# Patient Record
Sex: Female | Born: 1985 | Race: White | Hispanic: No | Smoking: Former smoker
Health system: Southern US, Community
[De-identification: ages and names within clinical notes are randomized; demographics above are authoritative.]

## PROBLEM LIST (undated history)

## (undated) DIAGNOSIS — F419 Anxiety disorder, unspecified: Secondary | ICD-10-CM

## (undated) DIAGNOSIS — F329 Major depressive disorder, single episode, unspecified: Secondary | ICD-10-CM

## (undated) DIAGNOSIS — Z8742 Personal history of other diseases of the female genital tract: Secondary | ICD-10-CM

## (undated) DIAGNOSIS — M51379 Other intervertebral disc degeneration, lumbosacral region without mention of lumbar back pain or lower extremity pain: Secondary | ICD-10-CM

## (undated) DIAGNOSIS — M549 Dorsalgia, unspecified: Secondary | ICD-10-CM

## (undated) DIAGNOSIS — M419 Scoliosis, unspecified: Secondary | ICD-10-CM

## (undated) DIAGNOSIS — Z9289 Personal history of other medical treatment: Secondary | ICD-10-CM

## (undated) DIAGNOSIS — F32A Depression, unspecified: Secondary | ICD-10-CM

## (undated) DIAGNOSIS — I2699 Other pulmonary embolism without acute cor pulmonale: Secondary | ICD-10-CM

## (undated) DIAGNOSIS — R7303 Prediabetes: Secondary | ICD-10-CM

## (undated) DIAGNOSIS — G43909 Migraine, unspecified, not intractable, without status migrainosus: Secondary | ICD-10-CM

## (undated) DIAGNOSIS — R002 Palpitations: Secondary | ICD-10-CM

## (undated) DIAGNOSIS — M5137 Other intervertebral disc degeneration, lumbosacral region: Secondary | ICD-10-CM

## (undated) HISTORY — DX: Personal history of other medical treatment: Z92.89

## (undated) HISTORY — DX: Prediabetes: R73.03

## (undated) HISTORY — PX: APPENDECTOMY: SHX54

## (undated) HISTORY — DX: Other intervertebral disc degeneration, lumbosacral region without mention of lumbar back pain or lower extremity pain: M51.379

## (undated) HISTORY — DX: Dorsalgia, unspecified: M54.9

## (undated) HISTORY — DX: Anxiety disorder, unspecified: F41.9

## (undated) HISTORY — DX: Personal history of other diseases of the female genital tract: Z87.42

## (undated) HISTORY — DX: Palpitations: R00.2

## (undated) HISTORY — DX: Scoliosis, unspecified: M41.9

## (undated) HISTORY — DX: Other pulmonary embolism without acute cor pulmonale: I26.99

## (undated) HISTORY — DX: Migraine, unspecified, not intractable, without status migrainosus: G43.909

## (undated) HISTORY — DX: Major depressive disorder, single episode, unspecified: F32.9

## (undated) HISTORY — DX: Depression, unspecified: F32.A

## (undated) HISTORY — DX: Other intervertebral disc degeneration, lumbosacral region: M51.37

---

## 2006-06-11 HISTORY — PX: BREAST SURGERY: SHX581

## 2006-09-27 ENCOUNTER — Ambulatory Visit: Payer: Self-pay | Admitting: Obstetrics and Gynecology

## 2006-10-15 ENCOUNTER — Ambulatory Visit: Payer: Self-pay | Admitting: Surgery

## 2006-10-23 ENCOUNTER — Ambulatory Visit: Payer: Self-pay | Admitting: Surgery

## 2006-11-12 ENCOUNTER — Emergency Department: Payer: Self-pay | Admitting: Emergency Medicine

## 2007-11-06 LAB — CONVERTED CEMR LAB

## 2007-12-08 ENCOUNTER — Observation Stay: Payer: Self-pay | Admitting: Obstetrics and Gynecology

## 2008-03-15 ENCOUNTER — Observation Stay: Payer: Self-pay

## 2008-03-22 ENCOUNTER — Inpatient Hospital Stay: Payer: Self-pay

## 2009-11-01 ENCOUNTER — Ambulatory Visit: Payer: Self-pay | Admitting: Family Medicine

## 2009-11-01 DIAGNOSIS — B009 Herpesviral infection, unspecified: Secondary | ICD-10-CM

## 2010-07-11 NOTE — Assessment & Plan Note (Signed)
Summary: NEW PT TO ESTABH/DLO   Vital Signs:  Patient profile:   25 year old female Height:      69.50 inches Weight:      183.13 pounds BMI:     26.75 Temp:     98.2 degrees F oral Pulse rate:   72 / minute Pulse rhythm:   regular BP sitting:   110 / 72  (left arm) Cuff size:   regular  Vitals Entered By: Linde Gillis CMA Duncan Dull) (29-Nov-2009 10:56 AM) CC: new patient, establish care   History of Present Illness: 25 yo here to establish care.  Cold sores- gets them around her lips when she is sick or during times of stress. Working out custody with the father of her son, so she has had a lot of stress lately and more sore. Uses topical Abreeva, does not help much.  Well woman- G1P1, goes to OBGYN (Dr. Denice Bors).  No h/o abnormal pap smears.    Had FLP, TSH, CMET done at work a few months ago, per pt normal.  Current Medications (verified): 1)  Valtrex 1 Gm Tabs (Valacyclovir Hcl) .Marland Kitchen.. 1 Tab By Mouth Two Times A Day X 1 Day  Allergies (verified): No Known Drug Allergies  Past History:  Family History: Last updated: 11-29-09 father died in 72s- alcholism mother- had breast CA at age 34, alive and healthy  Social History: Last updated: 11/29/2009 Originally from the [place].  Moved her with her mom 10 years ago.  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  Moved her with her mom 10 years ago.  Single mom, has 71 month old son. 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 Eyes:  Denies blurring. ENT:  Denies difficulty swallowing. CV:  Denies chest pain or discomfort and shortness of breath with exertion. Resp:  Denies shortness of breath. GI:  Denies abdominal pain  and change in bowel habits. GU:  Denies abnormal vaginal bleeding, discharge, dysuria, and genital sores. MS:  Denies joint pain, joint redness, and joint swelling. Derm:  Denies rash. Neuro:  Denies headaches. Psych:  Denies anxiety, depression, sense of great danger, suicidal thoughts/plans, thoughts of violence, unusual visions or sounds, and thoughts /plans of harming others. Endo:  Denies cold intolerance and heat intolerance. Heme:  Denies abnormal bruising.  Physical Exam  General:  Well-developed,well-nourished,in no acute distress; alert,appropriate and cooperative throughout examination Head:  normocephalic.   Eyes:  vision grossly intact, pupils equal, pupils round, and pupils reactive to light.   Ears:  R ear normal and L ear normal.   Nose:  no external deformity.   Mouth:  good dentition.   two herpes lesions around upper lip Lungs:  Normal respiratory effort, chest expands symmetrically. Lungs are clear to auscultation, no crackles or wheezes. Heart:  Normal rate and regular rhythm. S1 and S2 normal without gallop, murmur, click, rub or other extra sounds. Abdomen:  Bowel sounds positive,abdomen soft and non-tender without masses, organomegaly or hernias noted. Msk:  No deformity or scoliosis noted of thoracic or lumbar spine.   Extremities:  No clubbing, cyanosis, edema, or deformity noted with normal full range of motion of all joints.   Neurologic:  alert &  oriented X3 and cranial nerves II-XII intact.   Skin:  Intact without suspicious lesions or rashes Psych:  Cognition and judgment appear intact. Alert and cooperative with normal attention span and concentration. No apparent delusions, illusions, hallucinations   Impression & Recommendations:  Problem # 1:  Preventive Health Care (ICD-V70.0) Reviewed preventive care protocols, scheduled due services, and updated immunizations Discussed nutrition, exercise, diet, and healthy lifestyle.  She will make appt with  her OBGYN for pap smear and bring labs into our office.  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  Complete Medication List: 1)  Valtrex 1 Gm Tabs (Valacyclovir hcl) .Marland Kitchen.. 1 tab by mouth two times a day x 1 day  Patient Instructions: 1)  It was so nice to meet you. 2)  Please bring a copy of your labs when you get a chance. 3)  If you ever need anything, let me know. Prescriptions: VALTREX 1 GM TABS (VALACYCLOVIR HCL) 1 tab by mouth two times a day x 1 day  #30 x 1   Entered and Authorized by:   Ruthe Mannan MD   Signed by:   Ruthe Mannan MD on 11/01/2009   Method used:   Print then Give to Patient   RxID:   [id number]   Prior Medications (reviewed today): None Current Allergies (reviewed today): No known allergies   PAP Result Date:  11/06/2007 PAP Result:  historical PAP Next Due:  2 yr

## 2010-08-24 ENCOUNTER — Other Ambulatory Visit: Payer: Self-pay | Admitting: General Practice

## 2010-08-25 ENCOUNTER — Ambulatory Visit: Payer: Self-pay | Admitting: General Practice

## 2011-12-25 ENCOUNTER — Encounter: Payer: Self-pay | Admitting: Family Medicine

## 2011-12-25 ENCOUNTER — Ambulatory Visit (INDEPENDENT_AMBULATORY_CARE_PROVIDER_SITE_OTHER): Payer: PRIVATE HEALTH INSURANCE | Admitting: Family Medicine

## 2011-12-25 VITALS — BP 112/80 | HR 72 | Temp 98.5°F | Wt 167.0 lb

## 2011-12-25 DIAGNOSIS — R42 Dizziness and giddiness: Secondary | ICD-10-CM | POA: Insufficient documentation

## 2011-12-25 NOTE — Progress Notes (Signed)
  Subjective:    Patient ID: Autumn [name], female    DOB: 09-30-1985, 26 y.o.   MRN: [id number]  HPI  Very pleasant 26 yo female here for dizziness.  Since childhood, she has had episodes of self resolving vertigo- dizziness worsened by movement and changes in head position.  Often associated with nausea and no vomiting. She had  CT of her head a couple of years ago due to these symptoms which per her report was negative.  This episodes has been ongoing for 3 weeks. Saw a doctor at work that told her she had vertigo and gave her meclizine.  She cannot tolerate this- makes her too sleepy, dizzy.  No HA. No focal neurological deficits.  Patient Active Problem List  Diagnosis  . HERPES LABIALIS, RECURRENT  . Vertigo   No past medical history on file. No past surgical history on file. History  Substance Use Topics  . Smoking status: Former Games developer  . Smokeless tobacco: Not on file  . Alcohol Use: Not on file   Family History  Problem Relation Age of Onset  . Cancer Mother 38    breast  . Alcohol abuse Father    No Known Allergies Current Outpatient Prescriptions on File Prior to Visit  Medication Sig Dispense Refill  . norethindrone-ethinyl estradiol 1/35 (ORTHO-NOVUM, NORTREL,CYCLAFEM) tablet Take 1 tablet by mouth daily.       The PMH, PSH, Social History, Family History, Medications, and allergies have been reviewed in [facility], and have been updated if relevant.   Review of Systems    See HPI Objective:   Physical Exam BP 112/80  Pulse 72  Temp 98.5 F (36.9 C)  Wt 167 lb (75.751 kg)  General:  Well-developed,well-nourished,in no acute distress; alert,appropriate and cooperative throughout examination Head:  normocephalic and atraumatic.   Eyes:  vision grossly intact, pupils equal, pupils round, and pupils reactive to light. Pos mild nystagmus with dix hallpike   Msk:  No deformity or scoliosis noted of thoracic or lumbar spine.   Extremities:  No  clubbing, cyanosis, edema, or deformity noted with normal full range of motion of all joints.   Neurologic:  alert & oriented X3 and gait normal.   Skin:  Intact without suspicious lesions or rashes Psych:  Cognition and judgment appear intact. Alert and cooperative with normal attention span and concentration. No apparent delusions, illusions, hallucinations     Assessment & Plan:   1. Vertigo  Ambulatory referral to ENT   New- failed conservative tx. No red flag symptoms. Refer to ENT for treatment. The patient indicates understanding of these issues and agrees with the plan.

## 2011-12-25 NOTE — Patient Instructions (Addendum)
Great to see you. Please stop by to see Shirlee Limerick on your way out to set up your ENT referral.  Vertigo Vertigo means you feel like you or your surroundings are moving when they are not. Vertigo can be dangerous if it occurs when you are at work, driving, or performing difficult activities.  CAUSES  Vertigo occurs when there is a conflict of signals sent to your brain from the visual and sensory systems in your body. There are many different causes of vertigo, including:  Infections, especially in the inner ear.   A bad reaction to a drug or misuse of alcohol and medicines.   Withdrawal from drugs or alcohol.   Rapidly changing positions, such as lying down or rolling over in bed.   A migraine headache.   Decreased blood flow to the brain.   Increased pressure in the brain from a head injury, infection, tumor, or bleeding.  SYMPTOMS  You may feel as though the world is spinning around or you are falling to the ground. Because your balance is upset, vertigo can cause nausea and vomiting. You may have involuntary eye movements (nystagmus). DIAGNOSIS  Vertigo is usually diagnosed by physical exam. If the cause of your vertigo is unknown, your caregiver may perform imaging tests, such as an MRI scan (magnetic resonance imaging). TREATMENT  Most cases of vertigo resolve on their own, without treatment. Depending on the cause, your caregiver may prescribe certain medicines. If your vertigo is related to body position issues, your caregiver may recommend movements or procedures to correct the problem. In rare cases, if your vertigo is caused by certain inner ear problems, you may need surgery. HOME CARE INSTRUCTIONS   Follow your caregiver's instructions.   Avoid driving.   Avoid operating heavy machinery.   Avoid performing any tasks that would be dangerous to you or others during a vertigo episode.   Tell your caregiver if you notice that certain medicines seem to be causing your  vertigo. Some of the medicines used to treat vertigo episodes can actually make them worse in some people.  SEEK IMMEDIATE MEDICAL CARE IF:   Your medicines do not relieve your vertigo or are making it worse.   You develop problems with talking, walking, weakness, or using your arms, hands, or legs.   You develop severe headaches.   Your nausea or vomiting continues or gets worse.   You develop visual changes.   A family member notices behavioral changes.   Your condition gets worse.  MAKE SURE YOU:  Understand these instructions.   Will watch your condition.   Will get help right away if you are not doing well or get worse.  Document Released: 03/07/2005 Document Revised: 05/17/2011 Document Reviewed: 12/14/2010 [facility] Patient Information 2012 [place], [place].

## 2011-12-28 ENCOUNTER — Other Ambulatory Visit: Payer: Self-pay | Admitting: Otolaryngology

## 2011-12-28 LAB — CBC WITH DIFFERENTIAL/PLATELET
Basophil %: 0.8 %
Eosinophil #: 0.2 10*3/uL (ref 0.0–0.7)
Lymphocyte %: 41.3 %
MCH: 29.7 pg (ref 26.0–34.0)
MCHC: 32.4 g/dL (ref 32.0–36.0)
MCV: 92 fL (ref 80–100)
Monocyte %: 7 %
Neutrophil #: 3.4 10*3/uL (ref 1.4–6.5)
Platelet: 226 10*3/uL (ref 150–440)
RBC: 4.41 10*6/uL (ref 3.80–5.20)
RDW: 12.5 % (ref 11.5–14.5)
WBC: 7.1 10*3/uL (ref 3.6–11.0)

## 2011-12-28 LAB — BASIC METABOLIC PANEL
Anion Gap: 9 (ref 7–16)
BUN: 12 mg/dL (ref 7–18)
Calcium, Total: 8.6 mg/dL (ref 8.5–10.1)
Co2: 26 mmol/L (ref 21–32)
Creatinine: 0.68 mg/dL (ref 0.60–1.30)
EGFR (African American): >60
EGFR (Non-African Amer.): >60
Potassium: 3.6 mmol/L (ref 3.5–5.1)
Sodium: 139 mmol/L (ref 136–145)

## 2011-12-28 LAB — TSH: Thyroid Stimulating Horm: 0.936 u[IU]/mL

## 2011-12-29 ENCOUNTER — Emergency Department: Payer: Self-pay | Admitting: Emergency Medicine

## 2011-12-30 LAB — URINALYSIS, COMPLETE
Nitrite: NEGATIVE
Ph: 6 (ref 4.5–8.0)
Protein: NEGATIVE
RBC,UR: 3 /HPF (ref 0–5)
Specific Gravity: 1.03 (ref 1.003–1.030)
WBC UR: 25 /HPF (ref 0–5)

## 2011-12-30 LAB — PREGNANCY, URINE: Pregnancy Test, Urine: NEGATIVE m[IU]/mL

## 2012-01-01 ENCOUNTER — Ambulatory Visit: Payer: Self-pay | Admitting: Otolaryngology

## 2012-01-10 ENCOUNTER — Ambulatory Visit (INDEPENDENT_AMBULATORY_CARE_PROVIDER_SITE_OTHER): Payer: PRIVATE HEALTH INSURANCE | Admitting: Family Medicine

## 2012-01-10 ENCOUNTER — Encounter: Payer: Self-pay | Admitting: Family Medicine

## 2012-01-10 VITALS — BP 110/70 | HR 76 | Temp 98.0°F | Wt 168.0 lb

## 2012-01-10 DIAGNOSIS — N390 Urinary tract infection, site not specified: Secondary | ICD-10-CM | POA: Insufficient documentation

## 2012-01-10 DIAGNOSIS — Z113 Encounter for screening for infections with a predominantly sexual mode of transmission: Secondary | ICD-10-CM | POA: Insufficient documentation

## 2012-01-10 LAB — POCT URINALYSIS DIPSTICK
Bilirubin, UA: NEGATIVE
Ketones, UA: NEGATIVE
Nitrite, UA: NEGATIVE
pH, UA: 8

## 2012-01-10 NOTE — Patient Instructions (Signed)
Good to see you. We will call you with your lab results from today.  I would stop shaving for a little while- use perfume free, gentle soaps.  Call me if no improvement.

## 2012-01-10 NOTE — Progress Notes (Signed)
  Subjective:    Patient ID: Autumn [name], female    DOB: 11-22-1985, 26 y.o.   MRN: [id number]  HPI  Very pleasant 26 yo female her for STD check.  She does have a new sexual partner and has had unprotected sex with him. At end of her period now. Noticed some itchy bumps on her outer labia and she was worried they are herpes lesions.  She does have h/o HSV 1 (oral lesions) but has never had genital lesions.  No vaginal discharge or dyspareunia.  She was treated for UTI at urgent care over a week ago. No dysuria currently but would like to recheck UA.  Patient Active Problem List  Diagnosis  . HERPES LABIALIS, RECURRENT  . Vertigo  . Screening for STD (sexually transmitted disease)   No past medical history on file. No past surgical history on file. History  Substance Use Topics  . Smoking status: Former Games developer  . Smokeless tobacco: Not on file  . Alcohol Use: Not on file   Family History  Problem Relation Age of Onset  . Cancer Mother 69    breast  . Alcohol abuse Father    No Known Allergies Current Outpatient Prescriptions on File Prior to Visit  Medication Sig Dispense Refill  . norethindrone-ethinyl estradiol 1/35 (ORTHO-NOVUM, NORTREL,CYCLAFEM) tablet Take 1 tablet by mouth daily.      . valACYclovir (VALTREX) 1000 MG tablet Take 1,000 mg by mouth 2 (two) times daily.       The PMH, PSH, Social History, Family History, Medications, and allergies have been reviewed in [facility], and have been updated if relevant.   Review of Systems See HPI No fevers No n/v/d    Objective:   Physical Exam BP 110/70  Pulse 76  Temp 98 F (36.7 C)  Wt 168 lb (76.204 kg)  General:  Well-developed,well-nourished,in no acute distress; alert,appropriate and cooperative throughout examination Abdomen:  Bowel sounds positive,abdomen soft and non-tender without masses, organomegaly or hernias noted. Rectal:  no external abnormalities.   Genitalia:  Pelvic Exam:  External:         Small areas of erythema, raised, no papules or pustules on outer labia.               Vagina: normal without lesions or masses        Cervix: normal without lesions or masses, does have some blood in vault        Adnexa: normal bimanual exam without masses or fullness        Uterus: normal by palpation Neurologic:  alert & oriented X3 and gait normal.   Skin:  Intact without suspicious lesions or rashes Psych:  Cognition and judgment appear intact. Alert and cooperative with normal attention span and concentration. No apparent delusions, illusions, hallucinations     Assessment & Plan:   1. Screening for STD (sexually transmitted disease)  New- rash does not seem consistent with herpes and no papules to culture.  More consistent with contact/irritant dermatitis.  Advised supportive care.  See pt instructions for details.  GC swab sent.  HSV, HIV and RPR labs today.  HSV 2 antibody, IgG, HIV Antibody, RPR, GC/Chlamydia Amp Probe, Genital  2. UTI (lower urinary tract infection)  UA neg- reassurance provided.

## 2012-01-10 NOTE — Addendum Note (Signed)
Addended by: Eliezer Bottom on: 01/10/2012 11:20 AM   Modules accepted: Orders

## 2012-01-11 LAB — GC/CHLAMYDIA PROBE AMP, GENITAL: GC Probe Amp, Genital: NEGATIVE

## 2012-01-11 LAB — HIV ANTIBODY (ROUTINE TESTING W REFLEX): HIV: NONREACTIVE

## 2012-01-11 LAB — RPR

## 2012-01-11 LAB — HSV 2 ANTIBODY, IGG: HSV 2 Glycoprotein G Ab, IgG: <0.1 IV

## 2012-07-15 ENCOUNTER — Ambulatory Visit (INDEPENDENT_AMBULATORY_CARE_PROVIDER_SITE_OTHER): Payer: 59 | Admitting: Family Medicine

## 2012-07-15 ENCOUNTER — Encounter: Payer: Self-pay | Admitting: Family Medicine

## 2012-07-15 VITALS — BP 110/72 | HR 84 | Temp 98.3°F | Ht 69.5 in | Wt 176.5 lb

## 2012-07-15 DIAGNOSIS — F329 Major depressive disorder, single episode, unspecified: Secondary | ICD-10-CM | POA: Insufficient documentation

## 2012-07-15 DIAGNOSIS — F339 Major depressive disorder, recurrent, unspecified: Secondary | ICD-10-CM

## 2012-07-15 DIAGNOSIS — F418 Other specified anxiety disorders: Secondary | ICD-10-CM | POA: Insufficient documentation

## 2012-07-15 MED ORDER — SERTRALINE HCL 25 MG PO TABS: 25.0000 mg | ORAL_TABLET | Freq: Every day | ORAL | Status: DC

## 2012-07-15 MED ORDER — VALACYCLOVIR HCL 1 G PO TABS: 1000.0000 mg | ORAL_TABLET | Freq: Two times a day (BID) | ORAL | Status: DC

## 2012-07-15 NOTE — Patient Instructions (Signed)
Start sertraline 25 mg daily. Consider counseling, call if yo  Call if you have any intolerable side effects.u are interested.  Given at least 1 week for any side effects  to resolve and 3-4 weeks for any benefit to show up. Follow up in 1 month with myself or Dr. Dayton Martes.

## 2012-07-15 NOTE — Progress Notes (Signed)
  Subjective:    Patient ID: Autumn [name], female    DOB: 01/17/1986, 27 y.o.   MRN: [id number]  HPI 27 year old female pt of Dr. Elmer Sow presents with chronic depression She first noted issues after had son 4 years ago.  Works two jobs. A lot of stress. Raising son on her own. He is having behavoiral issues.  Started on citalopram daily by GYN last year. She took this for a week, had some dizziness.   Given klonopin x 2 times. She never went back.  Last years she had episode of very depressed, didn't care about living.  She reports  In last few monthshe has been irritable easily, overwhelmed easily. Excessive guilt and worry over things she cannot control. She has been more moody, tearful.  Has done counsling through work in past. She felt it did not help.  She has a friend who offers a lot of support.      Review of Systems  Constitutional: Negative for fever and fatigue.  HENT: Negative for ear pain.   Eyes: Negative for pain.  Respiratory: Negative for chest tightness and shortness of breath.   Cardiovascular: Negative for chest pain, palpitations and leg swelling.  Gastrointestinal: Negative for abdominal pain.  Genitourinary: Negative for dysuria.       Objective:   Physical Exam  Constitutional: Vital signs are normal. She appears well-developed and well-nourished. She is cooperative.  Non-toxic appearance. She does not appear ill. No distress.  HENT:  Head: Normocephalic.  Right Ear: Hearing, tympanic membrane, external ear and ear canal normal. Tympanic membrane is not erythematous, not retracted and not bulging.  Left Ear: Hearing, tympanic membrane, external ear and ear canal normal. Tympanic membrane is not erythematous, not retracted and not bulging.  Nose: No mucosal edema or rhinorrhea. Right sinus exhibits no maxillary sinus tenderness and no frontal sinus tenderness. Left sinus exhibits no maxillary sinus tenderness and no frontal sinus tenderness.   Mouth/Throat: Uvula is midline, oropharynx is clear and moist and mucous membranes are normal.  Eyes: Conjunctivae normal, EOM and lids are normal. Pupils are equal, round, and reactive to light. No foreign bodies found.  Neck: Trachea normal and normal range of motion. Neck supple. Carotid bruit is not present. No mass and no thyromegaly present.  Cardiovascular: Normal rate, regular rhythm, S1 normal, S2 normal, normal heart sounds, intact distal pulses and normal pulses.  Exam reveals no gallop and no friction rub.   No murmur heard. Pulmonary/Chest: Effort normal and breath sounds normal. Not tachypneic. No respiratory distress. She has no decreased breath sounds. She has no wheezes. She has no rhonchi. She has no rales.  Abdominal: Soft. Normal appearance and bowel sounds are normal. There is no tenderness.  Neurological: She is alert.  Skin: Skin is warm, dry and intact. No rash noted.  Psychiatric: Her speech is normal and behavior is normal. Judgment and thought content normal. Her mood appears not anxious. Cognition and memory are normal. She exhibits a depressed mood.       Tearful when discussing son          Assessment & Plan:

## 2012-07-15 NOTE — Assessment & Plan Note (Signed)
Recommended counsleing. Started on sertraline. Follow up closely in 1 month.  Total visit time 15 minutes, > 50% spent counseling and cordinating patients care.

## 2012-08-12 ENCOUNTER — Ambulatory Visit: Payer: 59 | Admitting: Family Medicine

## 2012-08-14 ENCOUNTER — Encounter: Payer: Self-pay | Admitting: Family Medicine

## 2012-08-14 ENCOUNTER — Ambulatory Visit (INDEPENDENT_AMBULATORY_CARE_PROVIDER_SITE_OTHER): Payer: 59 | Admitting: Family Medicine

## 2012-08-14 VITALS — BP 120/80 | HR 84 | Temp 98.2°F | Wt 170.0 lb

## 2012-08-14 DIAGNOSIS — F339 Major depressive disorder, recurrent, unspecified: Secondary | ICD-10-CM

## 2012-08-14 MED ORDER — SERTRALINE HCL 50 MG PO TABS: 50.0000 mg | ORAL_TABLET | Freq: Every day | ORAL | Status: DC

## 2012-08-14 NOTE — Patient Instructions (Addendum)
Good to see you. We are increasing your Zoloft to 50 mg daily. Call me in 2 weeks with an update, sooner if you feel worse.

## 2012-08-14 NOTE — Progress Notes (Signed)
Subjective:    Patient ID: Autumn [name], female    DOB: 10/11/1985, 27 y.o.   MRN: [id number]  HPI 27 year old very pleasant female with chronic depression here for one month follow up.  Saw Dr. Ermalene Searing last month.  Started on Zoloft 25 mg daily day last month.  Initially felt better but now feels tearful and agitated with son again.  Does not want to harm herself or her son.       Works two jobs. A lot of stress. Raising son on her own. He is having behavoiral issues.  Started on citalopram daily by GYN last year. She took this for a week, had some dizziness.   Given klonopin x 2 times.  Has done counseling through work in past. She felt it did not help.  She has a friend who offers a lot of support.   Patient Active Problem List  Diagnosis  . HERPES LABIALIS, RECURRENT  . Vertigo  . Screening for STD (sexually transmitted disease)  . Depression, major, recurrent   No past medical history on file. No past surgical history on file. History  Substance Use Topics  . Smoking status: Former Games developer  . Smokeless tobacco: Not on file  . Alcohol Use: Not on file   Family History  Problem Relation Age of Onset  . Cancer Mother 91    breast  . Alcohol abuse Father    No Known Allergies Current Outpatient Prescriptions on File Prior to Visit  Medication Sig Dispense Refill  . Calcium Carb-Cholecalciferol (CALCIUM + D3) 600-200 MG-UNIT TABS Take 1 tablet by mouth daily.      . Cyanocobalamin (VITAMIN B 12 PO) Take 500 mg by mouth daily.      . magnesium oxide (MAG-OX) 400 MG tablet Take 400 mg by mouth daily.      . naproxen sodium (ANAPROX) 550 MG tablet Take 550 mg by mouth 2 (two) times daily with a meal.      . norethindrone-ethinyl estradiol 1/35 (ORTHO-NOVUM, NORTREL,CYCLAFEM) tablet Take 1 tablet by mouth daily.      . tizanidine (ZANAFLEX) 2 MG capsule Take 2 mg by mouth as needed.      . valACYclovir (VALTREX) 1000 MG tablet Take 1 tablet (1,000 mg total) by  mouth 2 (two) times daily.  12 tablet  1   No current facility-administered medications on file prior to visit.   The PMH, PSH, Social History, Family History, Medications, and allergies have been reviewed in [facility], and have been updated if relevant.    Review of Systems  Constitutional: Negative for fever and fatigue.  HENT: Negative for ear pain.   Eyes: Negative for pain.  Respiratory: Negative for chest tightness and shortness of breath.   Cardiovascular: Negative for chest pain, palpitations and leg swelling.  Gastrointestinal: Negative for abdominal pain.  Genitourinary: Negative for dysuria.       Objective:   Physical Exam  BP 120/80  Pulse 84  Temp(Src) 98.2 F (36.8 C)  Wt 170 lb (77.111 kg)  BMI 24.75 kg/m2  Constitutional: Vital signs are normal. She appears well-developed and well-nourished. She is cooperative.  Non-toxic appearance. She does not appear ill. No distress.  HENT:  Head: Normocephalic.  Right Ear: Hearing, tympanic membrane, external ear and ear canal normal. Tympanic membrane is not erythematous, not retracted and not bulging.  Left Ear: Hearing, tympanic membrane, external ear and ear canal normal. Tympanic membrane is not erythematous, not retracted and not bulging.  Nose: No  mucosal edema or rhinorrhea. Right sinus exhibits no maxillary sinus tenderness and no frontal sinus tenderness. Left sinus exhibits no maxillary sinus tenderness and no frontal sinus tenderness.  Mouth/Throat: Uvula is midline, oropharynx is clear and moist and mucous membranes are normal.  Eyes: Conjunctivae normal, EOM and lids are normal. Pupils are equal, round, and reactive to light. No foreign bodies found.  Neck: Trachea normal and normal range of motion. Neck supple. Carotid bruit is not present. No mass and no thyromegaly present.  Cardiovascular: Normal rate, regular rhythm, S1 normal, S2 normal, normal heart sounds, intact distal pulses and normal pulses.  Exam reveals  no gallop and no friction rub.   No murmur heard. Pulmonary/Chest: Effort normal and breath sounds normal. Not tachypneic. No respiratory distress. She has no decreased breath sounds. She has no wheezes. She has no rhonchi. She has no rales.  Abdominal: Soft. Normal appearance and bowel sounds are normal. There is no tenderness.  Neurological: She is alert.  Skin: Skin is warm, dry and intact. No rash noted.  Psychiatric: Her speech is normal and behavior is normal. Judgment and thought content normal. Her mood appears not anxious. Cognition and memory are normal. She exhibits a depressed mood.       Tearful but appropriate     Assessment & Plan:  1. Depression, major, recurrent >25 min spent with face to face with patient, >50% counseling and/or coordinating care Not yet controlled on low dose Zoloft.  Will increase to Zoloft 50 mg daily. She will call me in 2- 3 weeks with an update, sooner if she feels worse.

## 2012-08-29 ENCOUNTER — Telehealth: Payer: Self-pay

## 2012-08-29 NOTE — Telephone Encounter (Signed)
Yes ok to increase to 100 mg daily of Zoloft.

## 2012-08-29 NOTE — Telephone Encounter (Signed)
Advised patient ok to increase dose.

## 2012-08-29 NOTE — Telephone Encounter (Signed)
Pt seen 08/14/12 calling with update on how feeling; pt said fills little better, energy level is 10 % better. Pt still has times of crying and agitation. Pt presently taking Sertraline 50 mg daily.  Pt said since she can tolerate Sertraline and not having side effects; pt wants to know if can increase to 100 mg daily and see how pt does before changing to a different med. [facility] pharmacy.Please advise.

## 2012-09-01 MED ORDER — SERTRALINE HCL 100 MG PO TABS: 100.0000 mg | ORAL_TABLET | Freq: Every day | ORAL | Status: DC

## 2012-09-01 NOTE — Addendum Note (Signed)
Addended by: Eliezer Bottom on: 09/01/2012 03:31 PM   Modules accepted: Orders

## 2012-09-01 NOTE — Telephone Encounter (Signed)
New script sent to pharmacy, per patient's request.

## 2012-09-23 ENCOUNTER — Encounter: Payer: Self-pay | Admitting: Family Medicine

## 2012-09-23 ENCOUNTER — Ambulatory Visit (INDEPENDENT_AMBULATORY_CARE_PROVIDER_SITE_OTHER): Payer: 59 | Admitting: Family Medicine

## 2012-09-23 ENCOUNTER — Telehealth: Payer: Self-pay

## 2012-09-23 VITALS — BP 110/60 | HR 68 | Temp 98.9°F | Wt 173.0 lb

## 2012-09-23 DIAGNOSIS — F339 Major depressive disorder, recurrent, unspecified: Secondary | ICD-10-CM

## 2012-09-23 MED ORDER — CLONAZEPAM 0.5 MG PO TABS: 0.5000 mg | ORAL_TABLET | Freq: Two times a day (BID) | ORAL | Status: DC | PRN

## 2012-09-23 MED ORDER — ESCITALOPRAM OXALATE 10 MG PO TABS: ORAL_TABLET | ORAL | Status: DC

## 2012-09-23 NOTE — Progress Notes (Signed)
Subjective:    Patient ID: Autumn [name], female    DOB: [birth year], 27 y.o.   MRN: [id number]  HPI 27 year old very pleasant female with chronic depression here for follow up.    We increased her Zoloft to 50 mg daily last month.  She stopped taking it over the weekend because she felt like it is not helping. She is now very tearful, irritable.  States she "does not want to be a mother anymore."  Denies wanting to harm her son or herself.  She has appointment with psychiatry on 10/20/2012, Dr. Maryruth Bun.   Works two jobs. A lot of stress. Raising her 33 1/2 year old son.  He is having behavoiral issues. She blames herself for her son's behavioral issues.  Had meeting with his doctor and teachers and his behavior is getting worse.  They think he has ADHD.  Mom helps her.  Father of her child is now involved.   Patient Active Problem List  Diagnosis  . HERPES LABIALIS, RECURRENT  . Vertigo  . Screening for STD (sexually transmitted disease)  . Depression, major, recurrent   No past medical history on file. No past surgical history on file. History  Substance Use Topics  . Smoking status: Former Games developer  . Smokeless tobacco: Not on file  . Alcohol Use: Not on file   Family History  Problem Relation Age of Onset  . Cancer Mother 4    breast  . Alcohol abuse Father    No Known Allergies Current Outpatient Prescriptions on File Prior to Visit  Medication Sig Dispense Refill  . Calcium Carb-Cholecalciferol (CALCIUM + D3) 600-200 MG-UNIT TABS Take 1 tablet by mouth daily.      . Cyanocobalamin (VITAMIN B 12 PO) Take 500 mg by mouth daily.      . magnesium oxide (MAG-OX) 400 MG tablet Take 400 mg by mouth daily.      . naproxen sodium (ANAPROX) 550 MG tablet Take 550 mg by mouth 2 (two) times daily with a meal.      . norethindrone-ethinyl estradiol 1/35 (ORTHO-NOVUM, NORTREL,CYCLAFEM) tablet Take 1 tablet by mouth daily.      . sertraline (ZOLOFT) 100 MG tablet Take 1  tablet (100 mg total) by mouth daily.  90 tablet  0  . tizanidine (ZANAFLEX) 2 MG capsule Take 2 mg by mouth as needed.      . valACYclovir (VALTREX) 1000 MG tablet Take 1 tablet (1,000 mg total) by mouth 2 (two) times daily.  12 tablet  1   No current facility-administered medications on file prior to visit.   The PMH, PSH, Social History, Family History, Medications, and allergies have been reviewed in [facility], and have been updated if relevant.    Review of Systems  Constitutional: Negative for fever and fatigue.  HENT: Negative for ear pain.   Eyes: Negative for pain.  Respiratory: Negative for chest tightness and shortness of breath.   Cardiovascular: Negative for chest pain, palpitations and leg swelling.  Gastrointestinal: Negative for abdominal pain.  Genitourinary: Negative for dysuria.       Objective:   Physical Exam  BP 110/60  Pulse 68  Temp(Src) 98.9 F (37.2 C)  Wt 173 lb (78.472 kg)  BMI 25.19 kg/m2  Constitutional: Vital signs are normal. She appears well-developed and well-nourished. She is cooperative.  Non-toxic appearance. She does not appear ill. No distress.  HENT:  Head: Normocephalic.  Neurological: She is alert.  Skin: Skin is warm, dry and intact.  No rash noted.  Psychiatric: Her speech is normal and behavior is normal. Judgment and thought content normal. Her mood appears not anxious. Cognition and memory are normal. She exhibits a depressed mood.       Tearful but appropriate     Assessment & Plan:  1. Depression, major, recurrent >25 min spent with face to face with patient, >50% counseling and/or coordinating care Will start Lexapro 10 mg daily, may increase to 20 mg daily after two weeks. I also refilled her Klonipin. She is aware that she needs to go to ER if she has any SI or HI. She will call me in 2 weeks with an update and keep appt with Dr. Maryruth Bun.

## 2012-09-23 NOTE — Telephone Encounter (Signed)
Pt called to update depression;  anxiety out of control pt has appt with Dr Maryruth Bun on 10/20/12. Pt wants to change med from Sertraline to another med. Pt stopped taking Sertraline this past weekend. Pt now severe irritablilty, anger, panic attacks, crying spells, depression, tired, pt does not want to harm herself or anyone else but Pt does not want to be a mom; pt has one child. Pt is presently at work. Scheduled appt today at 1 pm. If pt condition changes or worsens prior to appt pt will call back.

## 2012-09-23 NOTE — Patient Instructions (Addendum)
Good to see you. Hang in there. Keep your appointment with Dr. Maryruth Bun.  We are starting Lexapro.  Continue as needed Klonipin.

## 2012-11-04 ENCOUNTER — Other Ambulatory Visit: Payer: Self-pay | Admitting: Physician Assistant

## 2012-11-04 LAB — CBC WITH DIFFERENTIAL/PLATELET
Basophil %: 1.3 %
Eosinophil #: 0.2 10*3/uL (ref 0.0–0.7)
Eosinophil %: 3.8 %
HGB: 13.9 g/dL (ref 12.0–16.0)
MCH: 31 pg (ref 26.0–34.0)
MCV: 89 fL (ref 80–100)
Monocyte #: 0.4 x10 3/mm (ref 0.2–0.9)
Monocyte %: 7 %
Neutrophil %: 45.8 %
Platelet: 218 10*3/uL (ref 150–440)
RBC: 4.48 10*6/uL (ref 3.80–5.20)
WBC: 6.2 10*3/uL (ref 3.6–11.0)

## 2012-11-04 LAB — BASIC METABOLIC PANEL
Anion Gap: 4 — ABNORMAL LOW (ref 7–16)
BUN: 15 mg/dL (ref 7–18)
Calcium, Total: 8.8 mg/dL (ref 8.5–10.1)
Chloride: 105 mmol/L (ref 98–107)
Co2: 29 mmol/L (ref 21–32)
Creatinine: 0.84 mg/dL (ref 0.60–1.30)
Glucose: 69 mg/dL (ref 65–99)
Potassium: 3.5 mmol/L (ref 3.5–5.1)
Sodium: 138 mmol/L (ref 136–145)

## 2012-11-04 LAB — OCCULT BLOOD X 1 CARD TO LAB, STOOL: Occult Blood, Feces: NEGATIVE

## 2012-11-04 LAB — CLOSTRIDIUM DIFFICILE BY PCR

## 2012-11-05 ENCOUNTER — Encounter: Payer: Self-pay | Admitting: Radiology

## 2012-11-06 ENCOUNTER — Ambulatory Visit: Payer: 59 | Admitting: Family Medicine

## 2012-12-09 ENCOUNTER — Other Ambulatory Visit: Payer: Self-pay | Admitting: *Deleted

## 2012-12-09 MED ORDER — VALACYCLOVIR HCL 1 G PO TABS: 1000.0000 mg | ORAL_TABLET | Freq: Two times a day (BID) | ORAL | Status: DC

## 2013-02-23 ENCOUNTER — Other Ambulatory Visit: Payer: Self-pay | Admitting: *Deleted

## 2013-02-23 NOTE — Telephone Encounter (Signed)
Received faxed refill request from pharmacy. Last office visit 09/23/12. Is it okay to refill medication?

## 2013-02-24 MED ORDER — VALACYCLOVIR HCL 1 G PO TABS: 1000.0000 mg | ORAL_TABLET | Freq: Two times a day (BID) | ORAL | Status: DC

## 2013-02-25 ENCOUNTER — Other Ambulatory Visit: Payer: Self-pay | Admitting: Physician Assistant

## 2013-02-26 LAB — URINE CULTURE

## 2013-03-06 ENCOUNTER — Encounter: Payer: Self-pay | Admitting: Family Medicine

## 2013-03-06 ENCOUNTER — Ambulatory Visit (INDEPENDENT_AMBULATORY_CARE_PROVIDER_SITE_OTHER): Payer: 59 | Admitting: Family Medicine

## 2013-03-06 VITALS — BP 110/64 | HR 82 | Temp 98.2°F | Ht 69.5 in | Wt 187.0 lb

## 2013-03-06 DIAGNOSIS — R102 Pelvic and perineal pain: Secondary | ICD-10-CM | POA: Insufficient documentation

## 2013-03-06 DIAGNOSIS — R350 Frequency of micturition: Secondary | ICD-10-CM

## 2013-03-06 DIAGNOSIS — R141 Gas pain: Secondary | ICD-10-CM

## 2013-03-06 DIAGNOSIS — R300 Dysuria: Secondary | ICD-10-CM

## 2013-03-06 DIAGNOSIS — R140 Abdominal distension (gaseous): Secondary | ICD-10-CM

## 2013-03-06 LAB — POCT URINALYSIS DIPSTICK
Bilirubin, UA: NEGATIVE
Blood, UA: NEGATIVE
Glucose, UA: NEGATIVE
Spec Grav, UA: 1.015

## 2013-03-06 NOTE — Patient Instructions (Addendum)
Avoid greasy food, increase water and fiber in diet slowly.  Can try lactobaccili probiotic like align. Bring by records from Urgent care and work eval of abdominla symptoms.  We will call with urine culture and GC Chlam results. Avoid bladder irritants like caffeine alcohol, citris, tomato, spicy foods.

## 2013-03-06 NOTE — Progress Notes (Signed)
Subjective:    Patient ID: Autumn [name], female    DOB: 1985-07-20, 27 y.o.   MRN: [id number]  HPI  27 year old previously healthy pt of Dr. Elmer Sow presents with new onset  3 weeks intermittent pelvic central pressure, burning with urination and  vaginal burning,  In last week sharp pains in pelvic area. Darker urine, vaginal odor.  No vaginal itching or discharge.   Hx of UTI 8/6 treated with septra, never felt like symptoms resolved.  Saw Employee health last week.. Treated with macrobid. No change in symptoms. Urine culture showed mixed bacteria.Marland Kitchen Specimen contaminated... ie negative.  Had sex 2 weeks ago, one partner but they we unfaithful. Had STDs negative in last year. Menses regular every 3 months on OCPs. Hx of ovarian cyst.  She has also been having abdominal bloating, constipation since June. Smelly stool.  Seen at urgent care.  neg Ova parasites, neg Cdiff. X-ray showed constipation in June.Used stool softners. Symptoms improved with change from lexapro to other antidepressant  She continues to have bloating and gas. She goes from constipation to diarrhea, occ expolsive. Some relief with BMs.  Family history of colon cancer, distant.    Review of Systems  Constitutional: Negative for fever and fatigue.  HENT: Negative for ear pain.   Eyes: Negative for pain.  Respiratory: Negative for chest tightness and shortness of breath.   Cardiovascular: Negative for chest pain, palpitations and leg swelling.  Gastrointestinal: Positive for nausea, abdominal pain, diarrhea, constipation and abdominal distention. Negative for vomiting.  Genitourinary: Negative for dysuria.       Objective:   Physical Exam  Constitutional: Vital signs are normal. She appears well-developed and well-nourished. She is cooperative.  Non-toxic appearance. She does not appear ill. No distress.  HENT:  Head: Normocephalic.  Right Ear: Hearing, tympanic membrane, external ear and ear  canal normal. Tympanic membrane is not erythematous, not retracted and not bulging.  Left Ear: Hearing, tympanic membrane, external ear and ear canal normal. Tympanic membrane is not erythematous, not retracted and not bulging.  Nose: No mucosal edema or rhinorrhea. Right sinus exhibits no maxillary sinus tenderness and no frontal sinus tenderness. Left sinus exhibits no maxillary sinus tenderness and no frontal sinus tenderness.  Mouth/Throat: Uvula is midline, oropharynx is clear and moist and mucous membranes are normal.  Eyes: Conjunctivae, EOM and lids are normal. Pupils are equal, round, and reactive to light. Lids are everted and swept, no foreign bodies found.  Neck: Trachea normal and normal range of motion. Neck supple. Carotid bruit is not present. No mass and no thyromegaly present.  Cardiovascular: Normal rate, regular rhythm, S1 normal, S2 normal, normal heart sounds, intact distal pulses and normal pulses.  Exam reveals no gallop and no friction rub.   No murmur heard. Pulmonary/Chest: Effort normal and breath sounds normal. Not tachypneic. No respiratory distress. She has no decreased breath sounds. She has no wheezes. She has no rhonchi. She has no rales.  Abdominal: Soft. Normal appearance and bowel sounds are normal. There is no hepatosplenomegaly. There is tenderness in the suprapubic area. There is no CVA tenderness.  Genitourinary: There is no rash or tenderness on the right labia. There is no rash or tenderness on the left labia. Cervix exhibits no motion tenderness, no discharge and no friability. No erythema, tenderness or bleeding around the vagina. No foreign body around the vagina. No signs of injury around the vagina. Vaginal discharge found.  Sent in GC/Chlam  Neurological: She is alert.  Skin: Skin is warm, dry and intact. No rash noted.  Psychiatric: Her speech is normal and behavior is normal. Judgment and thought content normal. Her mood appears not anxious. Cognition  and memory are normal. She does not exhibit a depressed mood.          Assessment & Plan:

## 2013-03-07 DIAGNOSIS — R300 Dysuria: Secondary | ICD-10-CM | POA: Insufficient documentation

## 2013-03-07 LAB — POCT WET PREP WITH KOH
Clue Cells Wet Prep HPF POC: NEGATIVE
Epithelial Wet Prep HPF POC: NEGATIVE
Trichomonas, UA: NEGATIVE

## 2013-03-07 LAB — GC/CHLAMYDIA PROBE AMP
CT Probe RNA: NEGATIVE
GC Probe RNA: NEGATIVE

## 2013-03-07 LAB — POCT UA - MICROSCOPIC ONLY

## 2013-03-07 NOTE — Assessment & Plan Note (Signed)
Neg UA, neg u preg, Eval pending for GC/chlam but no cervical motion tenderness. Neg wet prep.  No clear suggestion of ovarian cyst, but pt with history.. [place] may be considered next.

## 2013-03-07 NOTE — Assessment & Plan Note (Signed)
UA clear, micro showed some contaminants, send for culture. ? Bladder irritation.Marland Kitchen Avoid irritants.

## 2013-03-07 NOTE — Assessment & Plan Note (Signed)
Most consistent with IBS. She will bring in records for Urgent care for work up already completed.. ? TSH, ? Celiac disease eval.  May need further lab eval.  Start with increase fiber, increase water, decrease stress, avoid fatty foods. Info given on IBS. Start probiotic for bowl regularity.

## 2013-06-27 ENCOUNTER — Ambulatory Visit: Payer: 59 | Admitting: Internal Medicine

## 2013-06-30 ENCOUNTER — Encounter: Payer: Self-pay | Admitting: Family Medicine

## 2013-06-30 ENCOUNTER — Ambulatory Visit (INDEPENDENT_AMBULATORY_CARE_PROVIDER_SITE_OTHER)
Admission: RE | Admit: 2013-06-30 | Discharge: 2013-06-30 | Disposition: A | Discharge status: Home / Self Care | Payer: 59 | Source: Ambulatory Visit | Attending: Family Medicine | Admitting: Family Medicine

## 2013-06-30 ENCOUNTER — Telehealth: Payer: Self-pay | Admitting: Family Medicine

## 2013-06-30 ENCOUNTER — Ambulatory Visit (INDEPENDENT_AMBULATORY_CARE_PROVIDER_SITE_OTHER): Payer: 59 | Admitting: Family Medicine

## 2013-06-30 VITALS — BP 118/78 | HR 95 | Temp 98.5°F | Ht 69.5 in | Wt 199.0 lb

## 2013-06-30 DIAGNOSIS — R071 Chest pain on breathing: Secondary | ICD-10-CM

## 2013-06-30 DIAGNOSIS — R109 Unspecified abdominal pain: Secondary | ICD-10-CM

## 2013-06-30 DIAGNOSIS — F17200 Nicotine dependence, unspecified, uncomplicated: Secondary | ICD-10-CM

## 2013-06-30 DIAGNOSIS — R079 Chest pain, unspecified: Secondary | ICD-10-CM

## 2013-06-30 DIAGNOSIS — R0789 Other chest pain: Secondary | ICD-10-CM

## 2013-06-30 LAB — POCT URINE PREGNANCY: PREG TEST UR: NEGATIVE

## 2013-06-30 NOTE — Progress Notes (Signed)
Pre-visit discussion using our clinic review tool. No additional management support is needed unless otherwise documented below in the visit note.  

## 2013-06-30 NOTE — Progress Notes (Signed)
Subjective:    Patient ID: Autumn [name], female    DOB: 09-22-1985, 28 y.o.   MRN: [id number]  HPI Here with side/ chest pain   Woke up 2 weeks ago - woke up with sharp stabbing pain in L chest - helped to press on it and lasted 20 min  While working this weekend - occ pain in mid chest or under R breast - lasting almost her whole shift - at least 2 hours per episode  Sat -worst to lift and bend over - seemed muscular Took tums to see if reflux-no improvement   (she tends to be gassy) Noted a few times -soreness to take a deep breath   This am - 15 min under R breast   No nausea/ sweating/sob or fainting symptoms   Not exertional/ just positional   Smoker- smokes 3-5 cig per day - had quit and re started last spring   She stopped her OC 1 mo ago - did have one light period  Thought it may have caused her migraines  She is sexualy active and does not use condoms   Urine preg test is neg today  Patient Active Problem List   Diagnosis Date Noted  . Chest wall pain 06/30/2013  . Smoking 06/30/2013  . Dysuria 03/07/2013  . Abdominal bloating 03/06/2013  . Pain in female pelvis 03/06/2013  . Anxiety and depression 07/15/2012  . Screening for STD (sexually transmitted disease) 01/10/2012  . Vertigo 12/25/2011  . HERPES LABIALIS, RECURRENT 11/01/2009   No past medical history on file. No past surgical history on file. History  Substance Use Topics  . Smoking status: Current Every Day Smoker -- 0.25 packs/day  . Smokeless tobacco: Never Used  . Alcohol Use: Yes     Comment: occasional   Family History  Problem Relation Age of Onset  . Cancer Mother 61    breast  . Alcohol abuse Father    No Known Allergies Current Outpatient Prescriptions on File Prior to Visit  Medication Sig Dispense Refill  . citalopram (CELEXA) 40 MG tablet Take 40 mg by mouth daily.      Marland Kitchen omeprazole (PRILOSEC) 40 MG capsule Take 40 mg by mouth daily as needed.       . tizanidine  (ZANAFLEX) 2 MG capsule Take 2 mg by mouth as needed.      . valACYclovir (VALTREX) 1000 MG tablet Take 1 tablet (1,000 mg total) by mouth 2 (two) times daily.  12 tablet  0   No current facility-administered medications on file prior to visit.    Review of Systems Review of Systems  Constitutional: Negative for fever, appetite change, fatigue and unexpected weight change.  Eyes: Negative for pain and visual disturbance.  Respiratory: Negative for cough and shortness of breath.   Cardiovascular: Negative for  palpitations neg for sob on exertion/ pedal edema /pnd or orthopnea    Gastrointestinal: Negative for nausea, diarrhea and constipation.  Genitourinary: Negative for urgency and frequency.  Skin: Negative for pallor or rash   Neurological: Negative for weakness, light-headedness, numbness and headaches.  Hematological: Negative for adenopathy. Does not bruise/bleed easily.  Psychiatric/Behavioral: Negative for dysphoric mood. The patient is not nervous/anxious.         Objective:   Physical Exam  Constitutional: She appears well-developed and well-nourished. No distress.  overwt and well app  HENT:  Head: Normocephalic and atraumatic.  Mouth/Throat: Oropharynx is clear and moist.  Eyes: Conjunctivae and EOM are normal. Pupils are  equal, round, and reactive to light. Right eye exhibits no discharge. Left eye exhibits no discharge. No scleral icterus.  Neck: Normal range of motion. Neck supple. No thyromegaly present.  Cardiovascular: Normal rate, regular rhythm and intact distal pulses.   Pulmonary/Chest: Effort normal and breath sounds normal. No respiratory distress. She has no wheezes. She has no rales. She exhibits tenderness.  Mild chest wall tenderness R anterolateral Good air exch  Abdominal: Soft. Bowel sounds are normal. She exhibits no distension and no mass. There is no tenderness. There is negative Murphy's sign.  Musculoskeletal: She exhibits no edema and no  tenderness.  Lymphadenopathy:    She has no cervical adenopathy.  Neurological: She is alert. She has normal reflexes. No cranial nerve deficit. She exhibits normal muscle tone. Coordination normal.  Skin: Skin is warm and dry. No rash noted. No erythema.  Psychiatric: She has a normal mood and affect.          Assessment & Plan:

## 2013-06-30 NOTE — Patient Instructions (Signed)
Use warm compress on chest or back when it bothers you  Chest xray today  If not bothersome to your stomach- ibuprofen may help  If short of breath or other symptoms let us know Think hard about quitting smoking

## 2013-06-30 NOTE — Telephone Encounter (Signed)
Patient Information:  Caller Name: [name]  Phone: [phone]  Patient: Autumn, [name]  Gender: Female  DOB: [birth year]  Age: 28 Years  PCP: Arnette Norris [facility])  Pregnant: No  Office Follow Up:  Does the office need to follow up with this patient?: No  Instructions For The Office: N/A  RN Note:  Patient states she Was told by the office there was no opening at the present time. Care advice reviewed and Advised caller to go to ER for evaluation. Understanding expressed  Symptoms  Reason For Call & Symptoms: Patient states she began experiencing "sharp stabbing pain in my chest" two weeks ago, lasting twenty minutes.  She did not experience it again until this weekend on Friday 06/26/13 - 06/28/13, under right breast, lasting 1 hour. Non radiating.  No nausea, slight short of breath. Last episode prior to call lasting 20 minutes.   Voice is clear and purposeful/relaxed.   Stopped BCP 2 weeks ago.  HX Chronic back pain. No cough /cold or congestion  Reviewed Health History In EMR: Yes  Reviewed Medications In EMR: Yes  Reviewed Allergies In EMR: Yes  Reviewed Surgeries / Procedures: Yes  Date of Onset of Symptoms: 06/16/2013 OB / GYN:  LMP: 06/16/2013  Guideline(s) Used:  Chest Pain  Disposition Per Guideline:   Go to ED Now (or to Office with PCP Approval)  Reason For Disposition Reached:   Chest pain lasting longer than 5 minutes  Advice Given:  Call Back If:  Severe chest pain  Constant chest pain lasting longer than 5 minutes  Difficulty breathing  You become worse.  RN Overrode Recommendation:  Go To ED  Advised ED

## 2013-07-01 NOTE — Assessment & Plan Note (Signed)
Disc in detail risks of smoking and possible outcomes including copd, vascular/ heart disease, cancer , respiratory and sinus infections  Pt voices understanding Pt not ready to quit yet but thinking about it

## 2013-07-01 NOTE — Assessment & Plan Note (Signed)
With some tenderness Suspect musculoskeletal  Given smoking - cxr done today- nl  Will watch for sob or other symptoms  Disc symptomatic care - see instructions on AVS - warm compress/ nsaid prn Update if not starting to improve in a week or if worsening

## 2013-07-02 ENCOUNTER — Other Ambulatory Visit: Payer: Self-pay | Admitting: Family Medicine

## 2013-07-02 MED ORDER — IBUPROFEN 800 MG PO TABS: 800.0000 mg | ORAL_TABLET | Freq: Three times a day (TID) | ORAL | Status: DC

## 2013-07-02 NOTE — Telephone Encounter (Signed)
Fax refill request, rout to PCP

## 2013-07-30 ENCOUNTER — Other Ambulatory Visit: Payer: Self-pay

## 2013-07-30 MED ORDER — VALACYCLOVIR HCL 1 G PO TABS: 1000.0000 mg | ORAL_TABLET | Freq: Two times a day (BID) | ORAL | Status: DC

## 2013-07-30 NOTE — Telephone Encounter (Signed)
Pt left v/m requesting refill valtrex to [facility]; pt request larger quantity of # 30 because pt getting outbreak more often. Have not changed quantity; waiting on Dr Hulen Shouts approval.

## 2013-08-18 ENCOUNTER — Ambulatory Visit (INDEPENDENT_AMBULATORY_CARE_PROVIDER_SITE_OTHER): Payer: 59 | Admitting: Family Medicine

## 2013-08-18 ENCOUNTER — Ambulatory Visit: Payer: Self-pay | Admitting: Family Medicine

## 2013-08-18 ENCOUNTER — Encounter: Payer: Self-pay | Admitting: Family Medicine

## 2013-08-18 ENCOUNTER — Telehealth: Payer: Self-pay | Admitting: Family Medicine

## 2013-08-18 VITALS — BP 110/80 | HR 85 | Temp 98.2°F | Ht 69.5 in | Wt 213.5 lb

## 2013-08-18 DIAGNOSIS — R102 Pelvic and perineal pain: Secondary | ICD-10-CM

## 2013-08-18 DIAGNOSIS — R140 Abdominal distension (gaseous): Secondary | ICD-10-CM

## 2013-08-18 DIAGNOSIS — R142 Eructation: Secondary | ICD-10-CM

## 2013-08-18 DIAGNOSIS — R141 Gas pain: Secondary | ICD-10-CM

## 2013-08-18 DIAGNOSIS — R300 Dysuria: Secondary | ICD-10-CM

## 2013-08-18 DIAGNOSIS — N949 Unspecified condition associated with female genital organs and menstrual cycle: Secondary | ICD-10-CM

## 2013-08-18 DIAGNOSIS — R143 Flatulence: Secondary | ICD-10-CM

## 2013-08-18 LAB — POCT URINALYSIS DIPSTICK
Bilirubin, UA: NEGATIVE
Glucose, UA: NEGATIVE
Ketones, UA: NEGATIVE
Leukocytes, UA: NEGATIVE
Nitrite, UA: NEGATIVE
PROTEIN UA: NEGATIVE
Spec Grav, UA: 1.025
UROBILINOGEN UA: 0.2
pH, UA: 6

## 2013-08-18 NOTE — Assessment & Plan Note (Signed)
Has had neg u preg at home recently and STD work up in last 6 months, no new partners.  Will eval with [place] to look into possibility of ovarian cyst etc.

## 2013-08-18 NOTE — Assessment & Plan Note (Signed)
Continued and separate from current poain. Likely IBS. Diet related. Discussed healthy eating, exercise, increase fiber and water in diet.

## 2013-08-18 NOTE — Progress Notes (Signed)
Pre visit review using our clinic review tool, if applicable. No additional management support is needed unless otherwise documented below in the visit note. 

## 2013-08-18 NOTE — Progress Notes (Signed)
Subjective:    Patient ID: Autumn [name], female    DOB: 03-22-1986, 28 y.o.   MRN: [id number]  Abdominal Pain Associated symptoms include dysuria. Pertinent negatives include no fever.  Dysuria     28 year old pt of Dr. Hulen Shouts presents with 3 weeks of new onset low abdominal pain.  Pain in abdomen worse, sharp in last 4-5 days.  Pain greater on left lower abdomen. Pain in left lower abdomen, inside pelvic area with urinating or if holding urination. No burning where urine coming out. No vaginal discharge Nausea, no vomiting, no fever. Nml BMs, no constipation.  Recent negative u preg. Mense has been regular  since off OCP in 05/2013 She has not been sexually active in last 2-3 week. HSe has not had any new sexual.   She was seen in 02/2013 with  new onset 3 weeks intermittent pelvic central pressure, burning with urination and vaginal burning,   Also sharp pains in pelvic area. Darker urine, vaginal odor.  No vaginal itching or discharge.  Hx of UTI 8/6 treated with septra, never felt like symptoms resolved.  Saw Employee health Treated with macrobid. No change in symptoms.  Urine culture showed mixed bacteria.Marland Kitchen Specimen contaminated... ie negative.  Menses regular every 3 months on OCPs.  Hx of ovarian cyst.  She has also been having abdominal bloating, constipation since June. Smelly stool.  Seen at urgent care for this as well over summer. neg Ova parasites, neg Cdiff.  X-ray showed constipation in June.  Used stool softners.   Today: She continues to have bloating and gas. Some relief with BMs.    Family history of colon cancer, distant.   02/2013 U culture clear, GC chlam neg, neg urine preg, neg wet prep      Review of Systems  Constitutional: Negative for fever and fatigue.  HENT: Negative for ear pain.   Eyes: Negative for pain.  Respiratory: Negative for chest tightness and shortness of breath.   Cardiovascular: Negative for chest pain,  palpitations and leg swelling.  Gastrointestinal: Positive for abdominal pain.  Genitourinary: Positive for dysuria.       Objective:   Physical Exam  Constitutional: Vital signs are normal. She appears well-developed and well-nourished. She is cooperative.  Non-toxic appearance. She does not appear ill. No distress.  HENT:  Head: Normocephalic.  Right Ear: Hearing, tympanic membrane, external ear and ear canal normal. Tympanic membrane is not erythematous, not retracted and not bulging.  Left Ear: Hearing, tympanic membrane, external ear and ear canal normal. Tympanic membrane is not erythematous, not retracted and not bulging.  Nose: No mucosal edema or rhinorrhea. Right sinus exhibits no maxillary sinus tenderness and no frontal sinus tenderness. Left sinus exhibits no maxillary sinus tenderness and no frontal sinus tenderness.  Mouth/Throat: Uvula is midline, oropharynx is clear and moist and mucous membranes are normal.  Eyes: Conjunctivae, EOM and lids are normal. Pupils are equal, round, and reactive to light. Lids are everted and swept, no foreign bodies found.  Neck: Trachea normal and normal range of motion. Neck supple. Carotid bruit is not present. No mass and no thyromegaly present.  Cardiovascular: Normal rate, regular rhythm, S1 normal, S2 normal, normal heart sounds, intact distal pulses and normal pulses.  Exam reveals no gallop and no friction rub.   No murmur heard. Pulmonary/Chest: Effort normal and breath sounds normal. Not tachypneic. No respiratory distress. She has no decreased breath sounds. She has no wheezes. She has no rhonchi. She has  no rales.  Abdominal: Soft. Normal appearance and bowel sounds are normal. There is no hepatosplenomegaly. There is tenderness in the right lower quadrant, suprapubic area and left lower quadrant. There is no rigidity, no guarding and no CVA tenderness.  Neurological: She is alert.  Skin: Skin is warm, dry and intact. No rash noted.    Psychiatric: Her speech is normal and behavior is normal. Judgment and thought content normal. Her mood appears not anxious. Cognition and memory are normal. She does not exhibit a depressed mood.          Assessment & Plan:

## 2013-08-18 NOTE — Telephone Encounter (Signed)
Relevant patient education assigned to patient using Emmi. ° °

## 2013-08-18 NOTE — Assessment & Plan Note (Signed)
UA clear .  Less dysuria and more abd pain worse with urination. NO symptoms of vaginal infection.

## 2013-08-18 NOTE — Patient Instructions (Signed)
Ibuprofen 800 mg every 8 hours for pain.  Stop at front desk to schedule US pelvis. If pain not improving schedule follow up with PCP.

## 2013-08-19 ENCOUNTER — Encounter: Payer: Self-pay | Admitting: Family Medicine

## 2013-08-20 ENCOUNTER — Telehealth: Payer: Self-pay

## 2013-08-20 NOTE — Telephone Encounter (Signed)
Pt left v/m requesting cb when Ultrasound results available.

## 2013-09-22 ENCOUNTER — Ambulatory Visit: Payer: 59 | Admitting: Family Medicine

## 2013-09-22 DIAGNOSIS — Z0289 Encounter for other administrative examinations: Secondary | ICD-10-CM

## 2013-11-02 ENCOUNTER — Ambulatory Visit: Payer: Self-pay | Admitting: Physician Assistant

## 2013-12-07 ENCOUNTER — Other Ambulatory Visit: Payer: Self-pay | Admitting: Family Medicine

## 2013-12-25 ENCOUNTER — Encounter: Payer: Self-pay | Admitting: Family Medicine

## 2013-12-25 ENCOUNTER — Ambulatory Visit (INDEPENDENT_AMBULATORY_CARE_PROVIDER_SITE_OTHER): Payer: 59 | Admitting: Family Medicine

## 2013-12-25 VITALS — BP 106/70 | HR 82 | Temp 98.4°F | Wt 228.5 lb

## 2013-12-25 DIAGNOSIS — R635 Abnormal weight gain: Secondary | ICD-10-CM

## 2013-12-25 DIAGNOSIS — F341 Dysthymic disorder: Secondary | ICD-10-CM

## 2013-12-25 DIAGNOSIS — F32A Depression, unspecified: Secondary | ICD-10-CM

## 2013-12-25 DIAGNOSIS — F329 Major depressive disorder, single episode, unspecified: Secondary | ICD-10-CM

## 2013-12-25 DIAGNOSIS — N912 Amenorrhea, unspecified: Secondary | ICD-10-CM | POA: Insufficient documentation

## 2013-12-25 DIAGNOSIS — F419 Anxiety disorder, unspecified: Secondary | ICD-10-CM

## 2013-12-25 LAB — COMPREHENSIVE METABOLIC PANEL
ALK PHOS: 74 U/L (ref 39–117)
ALT: 41 U/L — AB (ref 0–35)
AST: 30 U/L (ref 0–37)
Albumin: 4.3 g/dL (ref 3.5–5.2)
BILIRUBIN TOTAL: 0.7 mg/dL (ref 0.2–1.2)
BUN: 10 mg/dL (ref 6–23)
CALCIUM: 9.5 mg/dL (ref 8.4–10.5)
CHLORIDE: 102 meq/L (ref 96–112)
CO2: 27 mEq/L (ref 19–32)
CREATININE: 0.8 mg/dL (ref 0.4–1.2)
GFR: 97.94 mL/min (ref >60.00)
Glucose, Bld: 78 mg/dL (ref 70–99)
Potassium: 4.2 mEq/L (ref 3.5–5.1)
Sodium: 137 mEq/L (ref 135–145)
Total Protein: 7.5 g/dL (ref 6.0–8.3)

## 2013-12-25 LAB — T4, FREE: FREE T4: 0.81 ng/dL (ref 0.60–1.60)

## 2013-12-25 LAB — T3, FREE: T3 FREE: 3.3 pg/mL (ref 2.3–4.2)

## 2013-12-25 LAB — TSH: TSH: 1.13 u[IU]/mL (ref 0.35–4.50)

## 2013-12-25 NOTE — Progress Notes (Signed)
Subjective:   Patient ID: Autumn [name], female    DOB: 09/19/1985, 28 y.o.   MRN: [id number]  Autumn [name] is a pleasant 28 y.o. year old female who presents to clinic today with Weight Gain and Hot Flashes  on 12/25/2013  HPI:  Has been doing much better emotionally on Celexa.  No longer seeing Dr. Nicolasa Ducking.   She weighs more than she has ever weighed.  She thinks the celexa is increasing her appetite. Also having hot flashes and sweats.  Wants to know if I would prescribe an appetite suppressant.  Wt Readings from Last 3 Encounters:  12/25/13 228 lb 8 oz (103.647 kg)  08/18/13 213 lb 8 oz (96.843 kg)  06/30/13 199 lb (90.266 kg)   Her menstrual period is two weeks late.  Denies breast tenderness or nausea.  Current Outpatient Prescriptions on File Prior to Visit  Medication Sig Dispense Refill  . citalopram (CELEXA) 40 MG tablet Take 40 mg by mouth daily.      Marland Kitchen ibuprofen (ADVIL,MOTRIN) 800 MG tablet Take 1 tablet (800 mg total) by mouth 3 (three) times daily with meals.  90 tablet  0  . omeprazole (PRILOSEC) 40 MG capsule Take 40 mg by mouth daily as needed.       . tizanidine (ZANAFLEX) 2 MG capsule Take 2 mg by mouth as needed.      . valACYclovir (VALTREX) 1000 MG tablet Take 1 tablet (1,000 mg total) by mouth 2 (two) times daily.  12 tablet  0   No current facility-administered medications on file prior to visit.    No Known Allergies  No past medical history on file.  No past surgical history on file.  Family History  Problem Relation Age of Onset  . Cancer Mother 44    breast  . Alcohol abuse Father     History   Social History  . Marital Status: Single    Spouse Name: N/A    Number of Children: 1  . 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 Smoking status: Current Every Day Smoker -- 0.25 packs/day  . Smokeless tobacco: Never Used  . Alcohol Use: Yes     Comment: occasional  . Drug  Use: No  . Sexual Activity: Not on file   Other Topics Concern  . Not on file   Social History Narrative  . No narrative on file   The PMH, PSH, Social History, Family History, Medications, and allergies have been reviewed in [facility], and have been updated if relevant.   Review of Systems    See HPI Denies anxiety or depression No changes in hair, skin or bowels No insomnia No CP or SOB Objective:    BP 106/70  Pulse 82  Temp(Src) 98.4 F (36.9 C) (Oral)  Wt 228 lb 8 oz (103.647 kg)  SpO2 98%  LMP 11/10/2013   Physical Exam   General:  Well-developed,well-nourished,in no acute distress; alert,appropriate and cooperative throughout examination Head:  normocephalic and atraumatic.   Eyes:  vision grossly intact, pupils equal, pupils round, and pupils reactive to light.   Ears:  R ear normal and L ear normal.   Nose:  no external deformity.   Mouth:  good dentition.   Neck:  No deformities, masses, or tenderness noted. Msk:  No deformity or scoliosis noted of thoracic or lumbar spine.   Extremities:  No clubbing, cyanosis, edema, or deformity noted with  normal full range of motion of all joints.   Neurologic:  alert & oriented X3 and gait normal.   Skin:  Intact without suspicious lesions or rashes Psych:  Cognition and judgment appear intact. Alert and cooperative with normal attention span and concentration. No apparent delusions, illusions, hallucinations       Assessment & Plan:   Weight gain  Anxiety and depression No Follow-up on file.

## 2013-12-25 NOTE — Progress Notes (Signed)
Pre visit review using our clinic review tool, if applicable. No additional management support is needed unless otherwise documented below in the visit note. 

## 2013-12-25 NOTE — Assessment & Plan Note (Signed)
Stable on current dose of celexa. 

## 2013-12-25 NOTE — Assessment & Plan Note (Signed)
Deteriorated. Given her dose of Celexa, I do not feel comfortable prescribing phentermine. Did suggest nutritionist referral- she is declining this at this time. Will check labs today to rule out other possible contributing factors.

## 2013-12-25 NOTE — Patient Instructions (Signed)
Great to see you. I will call you with your lab results.   

## 2013-12-26 ENCOUNTER — Telehealth: Payer: Self-pay | Admitting: Family Medicine

## 2013-12-26 NOTE — Telephone Encounter (Signed)
Relevant patient education assigned to patient using Emmi. ° °

## 2013-12-28 ENCOUNTER — Encounter: Payer: Self-pay | Admitting: *Deleted

## 2014-01-15 ENCOUNTER — Telehealth: Payer: Self-pay

## 2014-01-15 NOTE — Telephone Encounter (Signed)
Please put her in a same day appt sooner.

## 2014-01-15 NOTE — Telephone Encounter (Signed)
Pt was seen on 12/25/13 with neg pregnancy test;pt still has not started menstrual cycle. Pt checked pregnancy test today and that was negative.  LMP 11/11/2013.  No abd pain. Pt scheduled appt with Dr Deborra Medina on 01/22/14 due to no sooner appt available. Pt wants to know if there is anything pt could be doing until appt. Or does Dr Deborra Medina want to see pt sooner. Pt request cb.

## 2014-01-15 NOTE — Telephone Encounter (Signed)
Lm on pts vm requesting a call back 

## 2014-01-18 NOTE — Telephone Encounter (Signed)
Lm on pts vm requesting a call back; advised she may schedule appt with front desk

## 2014-01-18 NOTE — Telephone Encounter (Addendum)
Pt left v/m pt still has appt on 01/22/14 and request blood work ordered for pregnancy test prior to 01/22/14 appt. Pt request cb.

## 2014-01-18 NOTE — Telephone Encounter (Signed)
Lm on pts vm requesting a call back. If pt returns call, pls schedule her in same day slot 8/11 or 8/12. She will have labs drawn when she comes. Dr Deborra Medina not in office to place prior labs

## 2014-01-19 ENCOUNTER — Encounter: Payer: Self-pay | Admitting: Family Medicine

## 2014-01-19 ENCOUNTER — Ambulatory Visit (INDEPENDENT_AMBULATORY_CARE_PROVIDER_SITE_OTHER): Payer: 59 | Admitting: Family Medicine

## 2014-01-19 ENCOUNTER — Telehealth: Payer: Self-pay | Admitting: Family Medicine

## 2014-01-19 VITALS — BP 118/76 | HR 86 | Temp 98.6°F | Wt 232.0 lb

## 2014-01-19 DIAGNOSIS — N390 Urinary tract infection, site not specified: Secondary | ICD-10-CM | POA: Insufficient documentation

## 2014-01-19 DIAGNOSIS — R140 Abdominal distension (gaseous): Secondary | ICD-10-CM

## 2014-01-19 DIAGNOSIS — R143 Flatulence: Secondary | ICD-10-CM

## 2014-01-19 DIAGNOSIS — R142 Eructation: Secondary | ICD-10-CM

## 2014-01-19 DIAGNOSIS — N912 Amenorrhea, unspecified: Secondary | ICD-10-CM

## 2014-01-19 DIAGNOSIS — R102 Pelvic and perineal pain: Secondary | ICD-10-CM

## 2014-01-19 DIAGNOSIS — R109 Unspecified abdominal pain: Secondary | ICD-10-CM

## 2014-01-19 DIAGNOSIS — R141 Gas pain: Secondary | ICD-10-CM

## 2014-01-19 DIAGNOSIS — N3000 Acute cystitis without hematuria: Secondary | ICD-10-CM

## 2014-01-19 LAB — POCT URINALYSIS DIPSTICK
BILIRUBIN UA: NEGATIVE
Blood, UA: NEGATIVE
Glucose, UA: NEGATIVE
KETONES UA: NEGATIVE
Nitrite, UA: NEGATIVE
PROTEIN UA: NEGATIVE
Urobilinogen, UA: 4
pH, UA: 6

## 2014-01-19 LAB — POCT URINE PREGNANCY: Preg Test, Ur: NEGATIVE

## 2014-01-19 LAB — HCG, QUANTITATIVE, PREGNANCY: Quantitative HCG: 0.21 m[IU]/mL

## 2014-01-19 MED ORDER — CIPROFLOXACIN HCL 500 MG PO TABS: 500.0000 mg | ORAL_TABLET | Freq: Two times a day (BID) | ORAL | Status: DC

## 2014-01-19 NOTE — Progress Notes (Signed)
Pre visit review using our clinic review tool, if applicable. No additional management support is needed unless otherwise documented below in the visit note. 

## 2014-01-19 NOTE — Patient Instructions (Signed)
Good to see you. We will call you with your blood test results.  Please wait until I call with the results to pick up your cipro prescription.  If your blood test is negative, we need to can a pelvic ultrasound.

## 2014-01-19 NOTE — Telephone Encounter (Signed)
Patient returned your call.  Please call her back at her work phone number.

## 2014-01-19 NOTE — Assessment & Plan Note (Signed)
New- advised to await hcg results before starting abx- erx for cipro 500 mg twice daily x 3 days sent to pharmacy (not safe during pregnancy but she is unlikely pregnant.  Will confirm with hcg quant prior to her starting rx.). The patient indicates understanding of these issues and agrees with the plan.

## 2014-01-19 NOTE — Telephone Encounter (Signed)
Noted. Order entered.

## 2014-01-19 NOTE — Telephone Encounter (Signed)
Spoke to pt and advised her of low count hcg. Pt states that she is wanting to proceed with u/s as discussed.

## 2014-01-19 NOTE — Progress Notes (Signed)
   Subjective:   Patient ID: Autumn [name], female    DOB: [birth year], 28 y.o.   MRN: [id number]  Autumn [name] is a pleasant 28 y.o. year old female who presents to clinic today with Amenorrhea, Back Pain and Flank Pain  on 01/19/2014  HPI: Amenorrhea-  LMP 11/10/13.  Has not been OCPs for over a year. +breast tenderness. Multiple neg u preg.  She is having some increased urinary frequency and dysuria.  No nausea or vomiting. No fevers Intermittent flank pain.  Current Outpatient Prescriptions on File Prior to Visit  Medication Sig Dispense Refill  . citalopram (CELEXA) 40 MG tablet Take 40 mg by mouth daily.      Marland Kitchen ibuprofen (ADVIL,MOTRIN) 800 MG tablet Take 1 tablet (800 mg total) by mouth 3 (three) times daily with meals.  90 tablet  0  . omeprazole (PRILOSEC) 40 MG capsule Take 40 mg by mouth daily as needed.       . tizanidine (ZANAFLEX) 2 MG capsule Take 2 mg by mouth as needed.      . valACYclovir (VALTREX) 1000 MG tablet Take 1 tablet (1,000 mg total) by mouth 2 (two) times daily.  12 tablet  0   No current facility-administered medications on file prior to visit.    No Known Allergies  No past medical history on file.  No past surgical history on file.  Family History  Problem Relation Age of Onset  . Cancer Mother 55    breast  . Alcohol abuse Father     History   Social History  . Marital Status: Single    Spouse Name: N/A    Number of Children: 1  . 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 Smoking status: Current Every Day Smoker -- 0.25 packs/day  . Smokeless tobacco: Never Used  . Alcohol Use: Yes     Comment: occasional  . Drug Use: No  . Sexual Activity: Not on file   Other Topics Concern  . Not on file   Social History Narrative  . No narrative on file   The PMH, PSH, Social History, Family History, Medications, and allergies have been reviewed in [facility], and have been  updated if relevant.    Review of Systems    See HPI +weight gain Wt Readings from Last 3 Encounters:  01/19/14 232 lb (105.235 kg)  12/25/13 228 lb 8 oz (103.647 kg)  08/18/13 213 lb 8 oz (96.843 kg)  +anxiety, denies depression No dizziness No CP or SOB +diarrhea  Objective:    BP 118/76  Pulse 86  Temp(Src) 98.6 F (37 C) (Oral)  Wt 232 lb (105.235 kg)  SpO2 96%  LMP 11/10/2013   Physical Exam  Nursing note and vitals reviewed. Constitutional: She appears well-developed and well-nourished. No distress.  HENT:  Head: Normocephalic.  Abdominal: Soft. Bowel sounds are normal. There is no tenderness. There is no rebound and no guarding.  Skin: Skin is warm and dry.  Psychiatric: She has a normal mood and affect. Her behavior is normal. Thought content normal.          Assessment & Plan:   Flank pain - Plan: Urinalysis Dipstick, Urine culture  Amenorrhea - Plan: POCT urine pregnancy  Acute cystitis without hematuria No Follow-up on file.

## 2014-01-19 NOTE — Addendum Note (Signed)
Addended by: Lucille Passy on: 01/19/2014 03:30 PM   Modules accepted: Orders

## 2014-01-19 NOTE — Assessment & Plan Note (Signed)
New- U preg again neg. Will check hcg quant- stat per pt request. If neg, will proceed with pelvic US. The patient indicates understanding of these issues and agrees with the plan.  Orders Placed This Encounter  Procedures  . Urine culture  . hCG, quantitative, pregnancy  . POCT urine pregnancy  . Urinalysis Dipstick

## 2014-01-19 NOTE — Assessment & Plan Note (Signed)
Deteriorated- likely IBS with now UTI symptoms. Discussed diet and exercise.  She does continue to gain weight as well.

## 2014-01-20 ENCOUNTER — Telehealth: Payer: Self-pay | Admitting: Family Medicine

## 2014-01-20 NOTE — Telephone Encounter (Signed)
Relevant patient education assigned to patient using Emmi. ° °

## 2014-01-21 ENCOUNTER — Ambulatory Visit: Payer: Self-pay | Admitting: Family Medicine

## 2014-01-21 LAB — URINE CULTURE
COLONY COUNT: NO GROWTH
ORGANISM ID, BACTERIA: NO GROWTH

## 2014-01-22 ENCOUNTER — Encounter: Payer: Self-pay | Admitting: Family Medicine

## 2014-01-22 ENCOUNTER — Ambulatory Visit: Payer: 59 | Admitting: Family Medicine

## 2014-01-22 ENCOUNTER — Telehealth: Payer: Self-pay

## 2014-01-22 NOTE — Telephone Encounter (Signed)
Pt left v/m requesting [place] results. [place] results scanned under 01/22/14 date.

## 2014-01-25 ENCOUNTER — Other Ambulatory Visit: Payer: Self-pay | Admitting: Family Medicine

## 2014-01-25 DIAGNOSIS — R102 Pelvic and perineal pain: Secondary | ICD-10-CM

## 2014-01-25 NOTE — Telephone Encounter (Signed)
See result note.  

## 2014-01-26 ENCOUNTER — Other Ambulatory Visit: Payer: Self-pay | Admitting: Family Medicine

## 2014-01-26 ENCOUNTER — Telehealth: Payer: Self-pay

## 2014-01-26 NOTE — Telephone Encounter (Signed)
Pt left v/m;pt does want to proceed with GYN referral to [facility] [place]. Pt used to see GYN at [facility] but does not want to go back to [place] [facility]. Pt request cb.

## 2014-01-27 NOTE — Telephone Encounter (Signed)
Patient wants to go to [facility] down the street from [place].

## 2014-02-04 ENCOUNTER — Ambulatory Visit (INDEPENDENT_AMBULATORY_CARE_PROVIDER_SITE_OTHER): Payer: 59 | Admitting: Obstetrics and Gynecology

## 2014-02-04 ENCOUNTER — Encounter: Payer: Self-pay | Admitting: Obstetrics and Gynecology

## 2014-02-04 VITALS — BP 109/75 | HR 82 | Ht 69.75 in | Wt 235.0 lb

## 2014-02-04 DIAGNOSIS — N912 Amenorrhea, unspecified: Secondary | ICD-10-CM

## 2014-02-04 DIAGNOSIS — R635 Abnormal weight gain: Secondary | ICD-10-CM

## 2014-02-04 LAB — POCT URINE PREGNANCY: Preg Test, Ur: NEGATIVE

## 2014-02-04 MED ORDER — MEDROXYPROGESTERONE ACETATE 10 MG PO TABS: 10.0000 mg | ORAL_TABLET | Freq: Every day | ORAL | Status: DC

## 2014-02-04 NOTE — Progress Notes (Signed)
Patient ID: Autumn [name], female   DOB: [birth year], 28 y.o.   MRN: [id number] 28 yo G1P1 presenting today for the evaluation of 1-month amenorrhea and increased weight gain. Patient reports a 75lb weight gain over the past year. She denies any changes in her eating habits or level of physical activity. She does not feel more stressed than usual . Nothing in her everyday life has changed. She states her cycles are typically regular occuring monthly and lasting 5 days. She had a similar episode of skipping periods a few years ago but this has been the longest time.  Past Medical History  Diagnosis Date  . Depression   . Anxiety   . Migraine   . Back pain     chronic  . Disc disease, degenerative, lumbar or lumbosacral   . Scoliosis    Past Surgical History  Procedure Laterality Date  . Appendectomy    . Breast surgery  2008    lump/ benign   Family History  Problem Relation Age of Onset  . Cancer Mother 60    breast  . Alcohol abuse Father   . Vision loss Maternal Grandmother   . Heart disease Maternal Grandmother   . Cancer Maternal Grandfather   . Alcohol abuse Paternal Grandfather   . Arthritis Maternal Aunt    History  Substance Use Topics  . Smoking status: Current Every Day Smoker -- 0.25 packs/day  . Smokeless tobacco: Never Used  . Alcohol Use: Yes     Comment: occasional   GENERAL: Well-developed, well-nourished female in no acute distress. Obese BREASTS: Symmetric in size. No palpable masses or lymphadenopathy, skin changes, or nipple drainage. ABDOMEN: Soft, nontender, nondistended. No organomegaly. PELVIC: Normal external female genitalia. Vagina is pink and rugated.  Normal discharge. Normal appearing cervix. Uterus is normal in size. No adnexal mass or tenderness. EXTREMITIES: No cyanosis, clubbing, or edema, 2+ distal pulses.  A/P 28 yo with 98-month amenorrhea - Patient with normal thyroid and normal ultrasound - Will check FSH, and LH - Rx provera  provided - RTC if no bleeding s/p provera - weight loss regimen discussed

## 2014-02-04 NOTE — Progress Notes (Signed)
Has had amenorrhea since June, has had negative UPT's and blood work, negative ultrasound.  Referred here by Dr. Deborra Medina to have hormones checked. Concern about weight gain.  Gained 80lbs in last year. Pap smears have been done at [facility] clinic, last one 2years ago.

## 2014-02-05 LAB — FOLLICLE STIMULATING HORMONE: FSH: 5.5 m[IU]/mL

## 2014-02-05 LAB — LUTEINIZING HORMONE: LH: 15.8 m[IU]/mL

## 2014-02-08 ENCOUNTER — Telehealth: Payer: Self-pay | Admitting: *Deleted

## 2014-02-08 NOTE — Telephone Encounter (Signed)
Patient called for lab results.  Notified results are WNL.

## 2014-02-12 ENCOUNTER — Encounter: Payer: 59 | Admitting: Family Medicine

## 2014-03-01 ENCOUNTER — Encounter: Payer: Self-pay | Admitting: *Deleted

## 2014-03-01 ENCOUNTER — Encounter: Payer: Self-pay | Admitting: Family Medicine

## 2014-03-01 ENCOUNTER — Ambulatory Visit (INDEPENDENT_AMBULATORY_CARE_PROVIDER_SITE_OTHER): Payer: 59 | Admitting: Family Medicine

## 2014-03-01 VITALS — BP 118/70 | HR 83 | Temp 98.5°F | Ht 69.75 in | Wt 231.0 lb

## 2014-03-01 DIAGNOSIS — R197 Diarrhea, unspecified: Secondary | ICD-10-CM | POA: Insufficient documentation

## 2014-03-01 NOTE — Patient Instructions (Signed)
Keep drinking fluids Bland diet if you want to eat - bananas/rice/applesause/ toast  You can take some immodium as directed otc. But if it causes abdominal pain stop it  Checking C diff and stool culture today -will inform you of results

## 2014-03-01 NOTE — Assessment & Plan Note (Signed)
2-3 days- continuous and watery w/o blood  Also nausea and malaise and cramping  Reassuring exam today  Check stool for c diff and also cx  Enc fluids  Disc BRAT diet  Trial of immodium is ok with caution Update if not starting to improve in a week or if worsening

## 2014-03-01 NOTE — Progress Notes (Signed)
Pre visit review using our clinic review tool, if applicable. No additional management support is needed unless otherwise documented below in the visit note. 

## 2014-03-01 NOTE — Progress Notes (Signed)
Subjective:    Patient ID: Autumn [name], female    DOB: [birth year], 28 y.o.   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 If it tasted right   No recent abx No recent travel or camping  She visited someone at hospice on Thurs   Patient Active Problem List   Diagnosis Date Noted  . UTI (urinary tract infection) 01/19/2014  . Weight gain 12/25/2013  . Amenorrhea 12/25/2013  . Chest wall pain 06/30/2013  . Smoking 06/30/2013  . Dysuria 03/07/2013  . Abdominal bloating 03/06/2013  . Pain in female pelvis 03/06/2013  . Anxiety and depression 07/15/2012  . Screening for STD (sexually transmitted disease) 01/10/2012  . Vertigo 12/25/2011  . HERPES LABIALIS, RECURRENT 11/01/2009   Past Medical History  Diagnosis Date  . Depression   . Anxiety   . Migraine   . Back pain     chronic  . Disc disease, degenerative, lumbar or lumbosacral   . Scoliosis    Past Surgical History  Procedure Laterality Date  . Appendectomy    . Breast surgery  2008    lump/ benign   History  Substance Use Topics  . Smoking status: Current Every Day Smoker    Types: Cigarettes  . Smokeless tobacco: Never Used     Comment: 3 cigarettes a day  . Alcohol Use: Yes     Comment: occasional   Family History  Problem Relation Age of Onset  . Cancer Mother 63    breast  . Alcohol abuse Father   . Vision loss Maternal Grandmother   . Heart disease Maternal Grandmother   .  Cancer Maternal Grandfather   . Alcohol abuse Paternal Grandfather   . Arthritis Maternal Aunt    No Known Allergies Current Outpatient Prescriptions on File Prior to Visit  Medication Sig Dispense Refill  . citalopram (CELEXA) 40 MG tablet Take 40 mg by mouth daily.      Marland Kitchen ibuprofen (ADVIL,MOTRIN) 800 MG tablet TAKE 1 TABLET BY MOUTH 3 TIMES DAILY WITH MEALS  90 tablet  0  . omeprazole (PRILOSEC) 40 MG capsule Take 40 mg by mouth daily as needed.       . tizanidine (ZANAFLEX) 2 MG capsule Take 2 mg by mouth as needed.      . valACYclovir (VALTREX) 1000 MG tablet TAKE 1 TABLET BY MOUTH 2 TIMES DAILY.  12 tablet  0   No current facility-administered medications on file prior to visit.    Review of Systems Review of Systems  Constitutional: Negative for fever, appetite change,  and unexpected weight change.  Eyes: Negative for pain and visual disturbance.  Respiratory: Negative for cough and shortness of breath.   Cardiovascular: Negative for cp or palpitations    Gastrointestinal: Negative for constipation/ blood in stool/ dark stool or vomiting  Genitourinary: Negative for urgency and frequency.  Skin:  Negative for pallor or rash   Neurological: Negative for weakness, light-headedness, numbness and headaches.  Hematological: Negative for adenopathy. Does not bruise/bleed easily.  Psychiatric/Behavioral: Negative for dysphoric mood. The patient is not nervous/anxious.         Objective:   Physical Exam  Constitutional: She appears well-developed and well-nourished. No distress.  HENT:  Head: Normocephalic and atraumatic.  Mouth/Throat: Oropharynx is clear and moist.  Eyes: Conjunctivae and EOM are normal. Pupils are equal, round, and reactive to light. No scleral icterus.  Neck: Normal range of motion. Neck supple.  Cardiovascular: Normal rate, regular rhythm and normal heart sounds.   Pulmonary/Chest: Effort normal and breath sounds normal. No respiratory distress. She has no  rales.  Abdominal: Soft. She exhibits no distension and no mass. There is tenderness. There is no rebound and no guarding.  Mildly hyperactive bs  Bilateral mild LQ tenderness  No rebound or guarding   Lymphadenopathy:    She has no cervical adenopathy.  Neurological: She is alert. She has normal reflexes. She exhibits normal muscle tone. Coordination normal.  Skin: Skin is warm and dry. No rash noted. No pallor.  Psychiatric: She has a normal mood and affect.          Assessment & Plan:   Problem List Items Addressed This Visit     Other   Diarrhea - Primary     2-3 days- continuous and watery w/o blood  Also nausea and malaise and cramping  Reassuring exam today  Check stool for c diff and also cx  Enc fluids  Disc BRAT diet  Trial of immodium is ok with caution Update if not starting to improve in a week or if worsening      Relevant Orders      Clostridium difficile EIA      Stool culture

## 2014-03-04 NOTE — Addendum Note (Signed)
Addended by: Ellamae Sia on: 03/04/2014 09:08 AM   Modules accepted: Orders

## 2014-04-12 ENCOUNTER — Encounter: Payer: Self-pay | Admitting: Family Medicine

## 2014-04-15 ENCOUNTER — Ambulatory Visit: Payer: Self-pay | Admitting: Family Medicine

## 2014-04-27 ENCOUNTER — Ambulatory Visit: Payer: Self-pay | Admitting: General Practice

## 2014-04-28 ENCOUNTER — Telehealth: Payer: Self-pay | Admitting: Family Medicine

## 2014-04-28 NOTE — Telephone Encounter (Signed)
Patient Information:  Caller Name: Kalanie  Phone: [phone]  Patient: Autumn [name],   Gender: Female  DOB: July 16, 1985  Age: 28 Years  PCP: Arnette [facility])  Pregnant: No  Office Follow Up:  Does the office need to follow up with this patient?: Yes  Instructions For The Office: REQUESTING TEMPORARY SUPPLY OF CITALOPRAM.  RN Note:  PLEASE GIVE HER A CALL TO LET HER KNOW IF RX CALLED TO PHARMACY- NEEDING TEMPORARY SUPPLY UNTIL SHE CAN BE SEEN IN THE OFFICE.  Symptoms  Reason For Call & Symptoms: Needing a refill of Citalopram 40 mgs 1 PO QD and has uncoming appiontment on 05/12/14. She took last pill today and is completely out of medicaiton and is worrried about withdrawal symptoms if she stops med abruptly. Has irregular menses and not sexually active. She uses [facility], information on file.  Reviewed Health History In EMR: Yes  Reviewed Medications In EMR: Yes  Reviewed Allergies In EMR: Yes  Reviewed Surgeries / Procedures: Yes  Date of Onset of Symptoms: 04/28/2014 OB / GYN:  LMP: 02/22/2014  Guideline(s) Used:  No Protocol Available - Information Only  Disposition Per Guideline:   Home Care  Reason For Disposition Reached:   Information only question and nurse able to answer  Advice Given:  Call Back If:  New symptoms develop  You become worse.  Patient Will Follow Care Advice:  YES

## 2014-04-28 NOTE — Telephone Encounter (Signed)
Spoke to pt and informed her Rx called in to pharmacy

## 2014-04-28 NOTE — Telephone Encounter (Signed)
Yes ok to refill as requested. 

## 2014-05-12 ENCOUNTER — Encounter: Payer: Self-pay | Admitting: Family Medicine

## 2014-05-12 ENCOUNTER — Ambulatory Visit (INDEPENDENT_AMBULATORY_CARE_PROVIDER_SITE_OTHER): Payer: 59 | Admitting: Family Medicine

## 2014-05-12 VITALS — BP 120/70 | HR 88 | Temp 98.6°F | Wt 242.5 lb

## 2014-05-12 DIAGNOSIS — F419 Anxiety disorder, unspecified: Principal | ICD-10-CM

## 2014-05-12 DIAGNOSIS — F418 Other specified anxiety disorders: Secondary | ICD-10-CM

## 2014-05-12 DIAGNOSIS — F329 Major depressive disorder, single episode, unspecified: Secondary | ICD-10-CM

## 2014-05-12 MED ORDER — VALACYCLOVIR HCL 1 G PO TABS: ORAL_TABLET | ORAL | Status: DC

## 2014-05-12 MED ORDER — CITALOPRAM HYDROBROMIDE 40 MG PO TABS: 40.0000 mg | ORAL_TABLET | Freq: Every day | ORAL | Status: DC

## 2014-05-12 NOTE — Progress Notes (Signed)
  Subjective:    Patient ID: Autumn [name], female    DOB: [birth year], 28 y.o.   MRN: [id number]  HPI 28 year old very pleasant female with chronic depression here for follow up.    Has tried several antidepressants- zoloft was ineffective. Lexapro caused weight gain. Saw Dr. Nicolasa Ducking previously, tried Effexor which was not effective.   Citalopram is working well.  No longer feels depressed or anxiety.  She has appointment with psychiatry on 10/20/2012, Dr. Nicolasa Ducking.  Sleeping ok.  No SI or HI.   Patient Active Problem List   Diagnosis Date Noted  . Diarrhea 03/01/2014  . UTI (urinary tract infection) 01/19/2014  . Weight gain 12/25/2013  . Amenorrhea 12/25/2013  . Chest wall pain 06/30/2013  . Smoking 06/30/2013  . Dysuria 03/07/2013  . Abdominal bloating 03/06/2013  . Pain in female pelvis 03/06/2013  . Anxiety and depression 07/15/2012  . Screening for STD (sexually transmitted disease) 01/10/2012  . Vertigo 12/25/2011  . HERPES LABIALIS, RECURRENT 11/01/2009   Past Medical History  Diagnosis Date  . Depression   . Anxiety   . Migraine   . Back pain     chronic  . Disc disease, degenerative, lumbar or lumbosacral   . Scoliosis    Past Surgical History  Procedure Laterality Date  . Appendectomy    . Breast surgery  2008    lump/ benign   History  Substance Use Topics  . Smoking status: Current Every Day Smoker    Types: Cigarettes  . Smokeless tobacco: Never Used     Comment: 3 cigarettes a day  . Alcohol Use: Yes     Comment: occasional   Family History  Problem Relation Age of Onset  . Cancer Mother 2    breast  . Alcohol abuse Father   . Vision loss Maternal Grandmother   . Heart disease Maternal Grandmother   . Cancer Maternal Grandfather   . Alcohol abuse Paternal Grandfather   . Arthritis Maternal Aunt    No Known Allergies Current Outpatient Prescriptions on File Prior to Visit  Medication Sig Dispense Refill  . citalopram (CELEXA)  40 MG tablet Take 40 mg by mouth daily.    Marland Kitchen ibuprofen (ADVIL,MOTRIN) 800 MG tablet TAKE 1 TABLET BY MOUTH 3 TIMES DAILY WITH MEALS 90 tablet 0  . omeprazole (PRILOSEC) 40 MG capsule Take 40 mg by mouth daily as needed.     . tizanidine (ZANAFLEX) 2 MG capsule Take 2 mg by mouth as needed.     No current facility-administered medications on file prior to visit.   The PMH, PSH, Social History, Family History, Medications, and allergies have been reviewed in [facility], and have been updated if relevant.    Review of Systems  See HPI      Objective:   Physical Exam  BP 120/70 mmHg  Pulse 88  Temp(Src) 98.6 F (37 C) (Oral)  Wt 242 lb 8 oz (109.997 kg)  SpO2 97%  Constitutional: Vital signs are normal. She appears well-developed and well-nourished. She is cooperative.  Non-toxic appearance. She does not appear ill. No distress.  HENT:  Head: Normocephalic.  Neurological: She is alert.  Skin: Skin is warm, dry and intact. No rash noted.  Psychiatric: Her speech is normal and behavior is normal. Judgment and thought content normal. Her mood appears not anxious. Cognition and memory are normal.  Tearful but appropriate     Assessment & Plan:

## 2014-05-12 NOTE — Patient Instructions (Signed)
Great to see you. Happy holidays.

## 2014-05-12 NOTE — Progress Notes (Signed)
Pre visit review using our clinic review tool, if applicable. No additional management support is needed unless otherwise documented below in the visit note. 

## 2014-05-12 NOTE — Assessment & Plan Note (Signed)
>  15 minutes spent in face to face time with patient, >50% spent in counselling or coordination of care Symptoms well controlled on current dose of celexa. No changes made.

## 2014-08-19 ENCOUNTER — Other Ambulatory Visit (HOSPITAL_COMMUNITY)
Admission: RE | Admit: 2014-08-19 | Discharge: 2014-08-19 | Disposition: A | Discharge status: Home / Self Care | Payer: 59 | Source: Ambulatory Visit | Attending: Family Medicine | Admitting: Family Medicine

## 2014-08-19 ENCOUNTER — Ambulatory Visit (INDEPENDENT_AMBULATORY_CARE_PROVIDER_SITE_OTHER): Payer: 59 | Admitting: Family Medicine

## 2014-08-19 ENCOUNTER — Encounter: Payer: Self-pay | Admitting: Family Medicine

## 2014-08-19 VITALS — BP 110/70 | HR 116 | Temp 98.3°F | Ht 70.0 in | Wt 241.0 lb

## 2014-08-19 DIAGNOSIS — Z113 Encounter for screening for infections with a predominantly sexual mode of transmission: Secondary | ICD-10-CM | POA: Insufficient documentation

## 2014-08-19 DIAGNOSIS — Z0000 Encounter for general adult medical examination without abnormal findings: Secondary | ICD-10-CM

## 2014-08-19 DIAGNOSIS — N760 Acute vaginitis: Secondary | ICD-10-CM | POA: Diagnosis present

## 2014-08-19 DIAGNOSIS — Z1151 Encounter for screening for human papillomavirus (HPV): Secondary | ICD-10-CM | POA: Insufficient documentation

## 2014-08-19 DIAGNOSIS — F418 Other specified anxiety disorders: Secondary | ICD-10-CM

## 2014-08-19 DIAGNOSIS — Z01419 Encounter for gynecological examination (general) (routine) without abnormal findings: Secondary | ICD-10-CM | POA: Insufficient documentation

## 2014-08-19 DIAGNOSIS — Q828 Other specified congenital malformations of skin: Secondary | ICD-10-CM

## 2014-08-19 DIAGNOSIS — R739 Hyperglycemia, unspecified: Secondary | ICD-10-CM | POA: Diagnosis not present

## 2014-08-19 DIAGNOSIS — N912 Amenorrhea, unspecified: Secondary | ICD-10-CM

## 2014-08-19 DIAGNOSIS — Z3009 Encounter for other general counseling and advice on contraception: Secondary | ICD-10-CM

## 2014-08-19 DIAGNOSIS — E669 Obesity, unspecified: Secondary | ICD-10-CM | POA: Insufficient documentation

## 2014-08-19 DIAGNOSIS — F329 Major depressive disorder, single episode, unspecified: Secondary | ICD-10-CM

## 2014-08-19 DIAGNOSIS — F419 Anxiety disorder, unspecified: Secondary | ICD-10-CM

## 2014-08-19 LAB — LIPID PANEL
Cholesterol: 151 mg/dL (ref 0–200)
HDL: 42.9 mg/dL (ref >39.00)
LDL Cholesterol: 90 mg/dL (ref 0–99)
NonHDL: 108.1
TRIGLYCERIDES: 93 mg/dL (ref 0.0–149.0)
Total CHOL/HDL Ratio: 4
VLDL: 18.6 mg/dL (ref 0.0–40.0)

## 2014-08-19 LAB — CBC WITH DIFFERENTIAL/PLATELET
Basophils Absolute: 0.1 10*3/uL (ref 0.0–0.1)
Basophils Relative: 0.8 % (ref 0.0–3.0)
EOS ABS: 0.2 10*3/uL (ref 0.0–0.7)
Eosinophils Relative: 3.8 % (ref 0.0–5.0)
HEMATOCRIT: 43.6 % (ref 36.0–46.0)
HEMOGLOBIN: 15 g/dL (ref 12.0–15.0)
LYMPHS ABS: 2.5 10*3/uL (ref 0.7–4.0)
Lymphocytes Relative: 39.1 % (ref 12.0–46.0)
MCHC: 34.3 g/dL (ref 30.0–36.0)
MCV: 89.3 fl (ref 78.0–100.0)
MONOS PCT: 6 % (ref 3.0–12.0)
Monocytes Absolute: 0.4 10*3/uL (ref 0.1–1.0)
NEUTROS ABS: 3.2 10*3/uL (ref 1.4–7.7)
NEUTROS PCT: 50.3 % (ref 43.0–77.0)
PLATELETS: 294 10*3/uL (ref 150.0–400.0)
RBC: 4.88 Mil/uL (ref 3.87–5.11)
RDW: 12.8 % (ref 11.5–15.5)
WBC: 6.4 10*3/uL (ref 4.0–10.5)

## 2014-08-19 LAB — COMPREHENSIVE METABOLIC PANEL
ALT: 55 U/L — ABNORMAL HIGH (ref 0–35)
AST: 31 U/L (ref 0–37)
Albumin: 4.5 g/dL (ref 3.5–5.2)
Alkaline Phosphatase: 79 U/L (ref 39–117)
BUN: 13 mg/dL (ref 6–23)
CO2: 27 meq/L (ref 19–32)
CREATININE: 0.79 mg/dL (ref 0.40–1.20)
Calcium: 9.8 mg/dL (ref 8.4–10.5)
Chloride: 104 mEq/L (ref 96–112)
GFR: 91.81 mL/min (ref >60.00)
Glucose, Bld: 100 mg/dL — ABNORMAL HIGH (ref 70–99)
POTASSIUM: 4.3 meq/L (ref 3.5–5.1)
Sodium: 136 mEq/L (ref 135–145)
Total Bilirubin: 0.3 mg/dL (ref 0.2–1.2)
Total Protein: 7.5 g/dL (ref 6.0–8.3)

## 2014-08-19 LAB — TSH: TSH: 1.07 u[IU]/mL (ref 0.35–4.50)

## 2014-08-19 LAB — HEMOGLOBIN A1C: Hgb A1c MFr Bld: 5.7 % (ref 4.6–6.5)

## 2014-08-19 MED ORDER — NORETHINDRONE-ETH ESTRADIOL 1-35 MG-MCG PO TABS: 1.0000 | ORAL_TABLET | Freq: Every day | ORAL | Status: DC

## 2014-08-19 NOTE — Progress Notes (Signed)
Subjective:   Patient ID: Autumn [name], female    DOB: [birth year], 29 y.o.   MRN: [id number]  Autumn [name] is a pleasant 29 y.o. year old female who presents to clinic today with Annual Exam and Contraception  on 08/19/2014  HPI:  G1P1- no h/o abnormal pap smears. Not currently sexually active but does want STD screening because she had unprotected sex last summer. Denies any current dysuria or abnormal discharge. Does have h/o chlamydia 06/2007.  No known family history of uterine or cervical CA. Mom had a type of "early breast CA" s/p lumpectomy.  Per pt, GYN at the time recommended that Aleksandrova should have first mammogram at age 63.  Periods are still irregular.  Does want to restart OCPs.  Periods are not very heavy, does have some cramping.  Influenza vaccine 03/2014  Anxiety/depression- taking Celexa 40 mg daily. Symptoms well controlled.  Obesity- has gained a large amount of weight in the last year or two.  She is working on diet and just started exercising. Wt Readings from Last 3 Encounters:  08/19/14 241 lb (109.317 kg)  05/12/14 242 lb 8 oz (109.997 kg)  03/01/14 231 lb (104.781 kg)     Current Outpatient Prescriptions on File Prior to Visit  Medication Sig Dispense Refill  . citalopram (CELEXA) 40 MG tablet Take 1 tablet (40 mg total) by mouth daily. 30 tablet 6  . ibuprofen (ADVIL,MOTRIN) 800 MG tablet TAKE 1 TABLET BY MOUTH 3 TIMES DAILY WITH MEALS 90 tablet 0  . omeprazole (PRILOSEC) 40 MG capsule Take 40 mg by mouth daily as needed.     . tizanidine (ZANAFLEX) 2 MG capsule Take 2 mg by mouth as needed.    . valACYclovir (VALTREX) 1000 MG tablet TAKE 1 TABLET BY MOUTH 2 TIMES DAILY. 12 tablet 2   No current facility-administered medications on file prior to visit.    No Known Allergies  Past Medical History  Diagnosis Date  . Depression   . Anxiety   . Migraine   . Back pain     chronic  . Disc disease, degenerative,  lumbar or lumbosacral   . Scoliosis     Past Surgical History  Procedure Laterality Date  . Appendectomy    . Breast surgery  2008    lump/ benign    Family History  Problem Relation Age of Onset  . Cancer Mother 66    breast  . Alcohol abuse Father   . Vision loss Maternal Grandmother   . Heart disease Maternal Grandmother   . Cancer Maternal Grandfather   . Alcohol abuse Paternal Grandfather   . Arthritis Maternal Aunt     History   Social History  . Marital Status: Single    Spouse Name: N/A  . Number of Children: 1  . 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 Smoking status: Current Every Day Smoker    Types: Cigarettes  . Smokeless tobacco: Never Used     Comment: 3 cigarettes a day  . Alcohol Use: Yes     Comment: occasional  . Drug Use: No  . Sexual Activity: No   Other Topics Concern  . Not on file   Social History Narrative   The PMH, PSH, Social History, Family History, Medications, and allergies have been reviewed in [facility], and have been updated if relevant.   Review of Systems  Constitutional: Negative.  HENT: Negative.   Respiratory: Negative.   Cardiovascular: Negative.   Gastrointestinal: Negative.   Endocrine: Negative.   Genitourinary: Negative.   Musculoskeletal: Negative.   Skin: Negative.   Allergic/Immunologic: Negative.   Neurological: Negative.   Hematological: Negative.   Psychiatric/Behavioral: Negative.   All other systems reviewed and are negative.      Objective:    BP 110/70 mmHg  Pulse 116  Temp(Src) 98.3 F (36.8 C) (Oral)  Ht 5\' 10"  (1.778 m)  Wt 241 lb (109.317 kg)  BMI 34.58 kg/m2  SpO2 97%  LMP 07/06/2014   Physical Exam   General:  Well-developed,well-nourished,in no acute distress; alert,appropriate and cooperative throughout examination Head:  normocephalic and atraumatic.   Eyes:  vision grossly intact, pupils equal, pupils round, and pupils  reactive to light.   Ears:  R ear normal and L ear normal.   Nose:  no external deformity.   Mouth:  good dentition.   Neck:  No deformities, masses, or tenderness noted. Breasts:  No mass, nodules, thickening, tenderness, bulging, retraction, inflamation, nipple discharge or skin changes noted.   Lungs:  Normal respiratory effort, chest expands symmetrically. Lungs are clear to auscultation, no crackles or wheezes. Heart:  Normal rate and regular rhythm. S1 and S2 normal without gallop, murmur, click, rub or other extra sounds. Abdomen:  Bowel sounds positive,abdomen soft and non-tender without masses, organomegaly or hernias noted. Rectal:  no external abnormalities.   Genitalia:  Pelvic Exam:        External: normal female genitalia without lesions or masses        Vagina: normal without lesions or masses        Cervix: normal without lesions or masses        Adnexa: normal bimanual exam without masses or fullness        Uterus: normal by palpation        Pap smear: performed Msk:  No deformity or scoliosis noted of thoracic or lumbar spine.   Extremities:  No clubbing, cyanosis, edema, or deformity noted with normal full range of motion of all joints.   Neurologic:  alert & oriented X3 and gait normal.   Skin:  Intact without suspicious lesions or rashes +multiple skin tags on neck Cervical Nodes:  No lymphadenopathy noted Axillary Nodes:  No palpable lymphadenopathy Psych:  Cognition and judgment appear intact. Alert and cooperative with normal attention span and concentration. No apparent delusions, illusions, hallucinations       Assessment & Plan:   Annual physical exam - Plan: Cytology - [facility] [place]  Screening for STD (sexually transmitted disease) - Plan: Cytology - [facility] [place]  Well woman exam with routine gynecological exam  Encounter for other general counseling or advice on contraception No Follow-up on file.

## 2014-08-19 NOTE — Assessment & Plan Note (Signed)
Reviewed preventive care protocols, scheduled due services, and updated immunizations Discussed nutrition, exercise, diet, and healthy lifestyle.  Pap smear done today.  Orders Placed This Encounter  Procedures  . CBC with Differential/Platelet  . Comprehensive metabolic panel  . Lipid panel  . TSH  . HIV antibody (with reflex)  . RPR  . Hemoglobin A1c

## 2014-08-19 NOTE — Progress Notes (Signed)
Pre visit review using our clinic review tool, if applicable. No additional management support is needed unless otherwise documented below in the visit note. 

## 2014-08-19 NOTE — Patient Instructions (Signed)
Good to see you. We are restarting your birth control pill.  Also please make an appointment to have your skin tags removed at your convenience.

## 2014-08-19 NOTE — Assessment & Plan Note (Signed)
She will schedule another appt for skin tag removal.

## 2014-08-19 NOTE — Assessment & Plan Note (Signed)
Well controlled on current rx. No changes made today. 

## 2014-08-19 NOTE — Assessment & Plan Note (Signed)
Restart OCPs- ortho novum- eRx sent.

## 2014-08-20 ENCOUNTER — Encounter: Payer: Self-pay | Admitting: *Deleted

## 2014-08-20 LAB — HIV ANTIBODY (ROUTINE TESTING W REFLEX): HIV: NONREACTIVE

## 2014-08-20 LAB — CYTOLOGY - PAP

## 2014-08-20 LAB — RPR

## 2014-08-21 LAB — CERVICOVAGINAL ANCILLARY ONLY
BACTERIAL VAGINITIS: NEGATIVE
CANDIDA VAGINITIS: NEGATIVE

## 2014-08-23 ENCOUNTER — Encounter: Payer: Self-pay | Admitting: *Deleted

## 2014-08-23 ENCOUNTER — Telehealth: Payer: Self-pay | Admitting: *Deleted

## 2014-08-23 NOTE — Telephone Encounter (Signed)
Pt contacted office and was advised of results. Pt states that she is still experiencing dysuria and vaginal burning and is wanting to know if there is anything that she can do to help since STD and urine were both negative.

## 2014-08-23 NOTE — Telephone Encounter (Signed)
Pt indicates she has not had any lifestyle changes or been intimate with any new partners.

## 2014-08-23 NOTE — Telephone Encounter (Signed)
Has she been using any new soaps or detergents?

## 2014-08-24 LAB — CERVICOVAGINAL ANCILLARY ONLY: HERPES (WINDOWPATH): NEGATIVE

## 2014-08-25 NOTE — Telephone Encounter (Signed)
Spoke to pt and advised per Dr Aron. Pt verbally expressed understanding.  

## 2014-08-25 NOTE — Telephone Encounter (Signed)
I would try changing to perfume free laundry detergent and cotton underwear that is not very tight since work up has been negative. Keep [place] updated.

## 2014-08-31 ENCOUNTER — Telehealth: Payer: Self-pay | Admitting: Family Medicine

## 2014-08-31 NOTE — Telephone Encounter (Signed)
Certainly any side effect, including fatigue, is possible.  I would give it a little more time and keep [place] updated.

## 2014-08-31 NOTE — Telephone Encounter (Signed)
Patient would like to talk to someone about her birth control meds.

## 2014-08-31 NOTE — Telephone Encounter (Signed)
Spoke to pt who states her new BCP is causing her to be fatigued. She is questioning if this is a possible side effect as she is getting enough rest, but is still tired throughout the day, since starting them Sunday.

## 2014-09-02 NOTE — Telephone Encounter (Signed)
Spoke to pt and advised per Dr Aron; pt verbally expressed understanding.  

## 2014-09-16 ENCOUNTER — Encounter: Payer: Self-pay | Admitting: Family Medicine

## 2014-09-16 ENCOUNTER — Ambulatory Visit (INDEPENDENT_AMBULATORY_CARE_PROVIDER_SITE_OTHER): Payer: 59 | Admitting: Family Medicine

## 2014-09-16 VITALS — BP 134/60 | HR 79 | Temp 98.1°F | Wt 242.8 lb

## 2014-09-16 DIAGNOSIS — D239 Other benign neoplasm of skin, unspecified: Secondary | ICD-10-CM

## 2014-09-16 DIAGNOSIS — L918 Other hypertrophic disorders of the skin: Secondary | ICD-10-CM | POA: Diagnosis not present

## 2014-09-16 DIAGNOSIS — D229 Melanocytic nevi, unspecified: Secondary | ICD-10-CM

## 2014-09-16 NOTE — Progress Notes (Signed)
Pre visit review using our clinic review tool, if applicable. No additional management support is needed unless otherwise documented below in the visit note. 

## 2014-09-16 NOTE — Progress Notes (Signed)
S: The patient complains of symptomatic skin tags her right neck and abdomen.  These are irritated by clothing, jewelry and rubbing.  Current Outpatient Prescriptions on File Prior to Visit  Medication Sig Dispense Refill  . citalopram (CELEXA) 40 MG tablet Take 1 tablet (40 mg total) by mouth daily. 30 tablet 6  . ibuprofen (ADVIL,MOTRIN) 800 MG tablet TAKE 1 TABLET BY MOUTH 3 TIMES DAILY WITH MEALS 90 tablet 0  . norethindrone-ethinyl estradiol 1/35 (ORTHO-NOVUM, NORTREL,CYCLAFEM) tablet Take 1 tablet by mouth daily. 1 Package 11  . omeprazole (PRILOSEC) 40 MG capsule Take 40 mg by mouth daily as needed.     . tizanidine (ZANAFLEX) 2 MG capsule Take 2 mg by mouth as needed.    . valACYclovir (VALTREX) 1000 MG tablet TAKE 1 TABLET BY MOUTH 2 TIMES DAILY. 12 tablet 2   No current facility-administered medications on file prior to visit.    No Known Allergies  Past Medical History  Diagnosis Date  . Depression   . Anxiety   . Migraine   . Back pain     chronic  . Disc disease, degenerative, lumbar or lumbosacral   . Scoliosis     Past Surgical History  Procedure Laterality Date  . Appendectomy    . Breast surgery  2008    lump/ benign    Family History  Problem Relation Age of Onset  . Cancer Mother 81    breast  . Alcohol abuse Father   . Vision loss Maternal Grandmother   . Heart disease Maternal Grandmother   . Cancer Maternal Grandfather   . Alcohol abuse Paternal Grandfather   . Arthritis Maternal Aunt     History   Social History  . Marital Status: Single    Spouse Name: N/A  . Number of Children: 1  . 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 Smoking status: Current Every Day Smoker    Types: Cigarettes  . Smokeless tobacco: Never Used     Comment: 3 cigarettes a day  . Alcohol Use: Yes     Comment: occasional  . Drug Use: No  . Sexual Activity: No   Other Topics Concern  . Not on file   Social  History Narrative   The PMH, PSH, Social History, Family History, Medications, and allergies have been reviewed in [facility], and have been updated if relevant.   O: Patient appears well. Several benign skin tags are noted on her right neck and one large one on her abdomen  A: Skin tags   P: Skin tags are snipped off using Betadine for cleansing and sterile iris scissors. Local anesthesia was used. These pathognomonic lesions are not sent for pathology.

## 2015-01-04 ENCOUNTER — Ambulatory Visit: Payer: 59 | Admitting: Family Medicine

## 2015-01-11 ENCOUNTER — Encounter (INDEPENDENT_AMBULATORY_CARE_PROVIDER_SITE_OTHER): Payer: Self-pay

## 2015-01-11 ENCOUNTER — Telehealth: Payer: Self-pay | Admitting: Family Medicine

## 2015-01-11 ENCOUNTER — Ambulatory Visit (INDEPENDENT_AMBULATORY_CARE_PROVIDER_SITE_OTHER): Payer: 59 | Admitting: Family Medicine

## 2015-01-11 ENCOUNTER — Encounter: Payer: Self-pay | Admitting: Family Medicine

## 2015-01-11 VITALS — BP 116/66 | HR 87 | Temp 98.1°F | Wt 239.8 lb

## 2015-01-11 DIAGNOSIS — N951 Menopausal and female climacteric states: Secondary | ICD-10-CM

## 2015-01-11 DIAGNOSIS — F418 Other specified anxiety disorders: Secondary | ICD-10-CM | POA: Diagnosis not present

## 2015-01-11 DIAGNOSIS — F329 Major depressive disorder, single episode, unspecified: Secondary | ICD-10-CM

## 2015-01-11 DIAGNOSIS — R232 Flushing: Secondary | ICD-10-CM

## 2015-01-11 DIAGNOSIS — F419 Anxiety disorder, unspecified: Principal | ICD-10-CM

## 2015-01-11 MED ORDER — ESCITALOPRAM OXALATE 10 MG PO TABS: ORAL_TABLET | ORAL | Status: DC

## 2015-01-11 NOTE — Telephone Encounter (Signed)
Lm on pts vm and advised per Dr Aron.  

## 2015-01-11 NOTE — Telephone Encounter (Signed)
Pt came back from appt with Dr. Deborra Medina and had a question about the weaning off one medication and onto another. She said she couldn't remember how Dr. Deborra Medina said to do it, she wants to know if she is needing to start the new medication now as she weans off the other or does she need to be off the first medication completely before starting the new.  Best number to contact pt back at today is [phone] (work).

## 2015-01-11 NOTE — Patient Instructions (Signed)
Great to see you. We are starting Lexapro.  In order to wean off of celexa- 1 tablet every other day for 1 week, then 1/2 tablet every other day for 1 week.  May need to take longer if needed.

## 2015-01-11 NOTE — Assessment & Plan Note (Signed)
>  25 minutes spent in face to face time with patient, >50% spent in counselling or coordination of care  She would like to try to wean off of celexa due to hot flashes- wean instructions written in AVS and given to patient.  Start Lexapro - start with 10 mg daily and increase to 20 mg daily as tolerated.  Follow up in 1 month.

## 2015-01-11 NOTE — Telephone Encounter (Signed)
Yes ok to start new rx (lexapro) right away while weaning off celexa- instructions in AVS.

## 2015-01-11 NOTE — Progress Notes (Signed)
Pre visit review using our clinic review tool, if applicable. No additional management support is needed unless otherwise documented below in the visit note. 

## 2015-01-11 NOTE — Progress Notes (Signed)
Subjective:    Patient ID: Autumn [name], female    DOB: [birth year], 29 y.o.   MRN: [id number]  HPI 29 year old very pleasant female with chronic depression here for follow up.    Has tried several antidepressants- zoloft was ineffective. She thought lexapro caused weight gain but it was more effective for anxiety and mood swings, also did not have hot flashes. Saw Dr. Nicolasa Ducking previously, tried Effexor which was not effective.   Citalopram is working ok but causes "severe hot flashes."  Sleeping ok.  No SI or HI.   Patient Active Problem List   Diagnosis Date Noted  . Cutaneous skin tags 09/16/2014  . Encounter for other general counseling or advice on contraception 08/19/2014  . Obesity (BMI 30.0-34.9) 08/19/2014  . Accessory skin tags 08/19/2014  . Amenorrhea 12/25/2013  . Anxiety and depression 07/15/2012  . Screening for STD (sexually transmitted disease) 01/10/2012   Past Medical History  Diagnosis Date  . Depression   . Anxiety   . Migraine   . Back pain     chronic  . Disc disease, degenerative, lumbar or lumbosacral   . Scoliosis    Past Surgical History  Procedure Laterality Date  . Appendectomy    . Breast surgery  2008    lump/ benign   History  Substance Use Topics  . Smoking status: Current Every Day Smoker    Types: Cigarettes  . Smokeless tobacco: Never Used     Comment: 3 cigarettes a day  . Alcohol Use: Yes     Comment: occasional   Family History  Problem Relation Age of Onset  . Cancer Mother 59    breast  . Alcohol abuse Father   . Vision loss Maternal Grandmother   . Heart disease Maternal Grandmother   . Cancer Maternal Grandfather   . Alcohol abuse Paternal Grandfather   . Arthritis Maternal Aunt    No Known Allergies Current Outpatient Prescriptions on File Prior to Visit  Medication Sig Dispense Refill  . citalopram (CELEXA) 40 MG tablet Take 1 tablet (40 mg total) by mouth daily. 30 tablet 6  . ibuprofen  (ADVIL,MOTRIN) 800 MG tablet TAKE 1 TABLET BY MOUTH 3 TIMES DAILY WITH MEALS 90 tablet 0  . norethindrone-ethinyl estradiol 1/35 (ORTHO-NOVUM, NORTREL,CYCLAFEM) tablet Take 1 tablet by mouth daily. 1 Package 11  . omeprazole (PRILOSEC) 40 MG capsule Take 40 mg by mouth daily as needed.     . tizanidine (ZANAFLEX) 2 MG capsule Take 2 mg by mouth as needed.    . valACYclovir (VALTREX) 1000 MG tablet TAKE 1 TABLET BY MOUTH 2 TIMES DAILY. 12 tablet 2   No current facility-administered medications on file prior to visit.   The PMH, PSH, Social History, Family History, Medications, and allergies have been reviewed in [facility], and have been updated if relevant.    Review of Systems  See HPI Review of Systems  Endocrine: Positive for heat intolerance.  Psychiatric/Behavioral: Positive for dysphoric mood. Negative for suicidal ideas, sleep disturbance and self-injury. The patient is nervous/anxious.         Objective:   Physical Exam  BP 116/66 mmHg  Pulse 87  Temp(Src) 98.1 F (36.7 C) (Oral)  Wt 239 lb 12 oz (108.75 kg)  SpO2 97%  Constitutional: Vital signs are normal. She appears well-developed and well-nourished. She is cooperative.  Non-toxic appearance. She does not appear ill. No distress.  HENT:  Head: Normocephalic.  Neurological: She is alert.  Skin: Skin  is warm, dry and intact. No rash noted.  Psychiatric: Her speech is normal and behavior is normal. Judgment and thought content normal. Her mood appears not anxious. Cognition and memory are normal.  Tearful but appropriate     Assessment & Plan:

## 2015-02-10 ENCOUNTER — Other Ambulatory Visit: Payer: Self-pay | Admitting: Family Medicine

## 2015-02-11 ENCOUNTER — Telehealth: Payer: Self-pay | Admitting: *Deleted

## 2015-02-11 NOTE — Telephone Encounter (Signed)
Spoke to pt and informed her there has not been a change in her medications. I advised her to contact the pharmacy from which she received them to see if they have changed manufactures; same medication but different looks. She states she assumed the same, but wanted to confirm in the event that it had to be changed for insurance purposes.

## 2015-02-11 NOTE — Telephone Encounter (Signed)
Pt left voicemail at Triage. Pt received her BC pills today and it came in a different package and the pills don't look the same. Pt wants to make sure she is on right BCP or did Dr. Deborra Medina change something

## 2015-04-25 ENCOUNTER — Ambulatory Visit (INDEPENDENT_AMBULATORY_CARE_PROVIDER_SITE_OTHER): Payer: 59 | Admitting: Family Medicine

## 2015-04-25 ENCOUNTER — Encounter: Payer: Self-pay | Admitting: Family Medicine

## 2015-04-25 VITALS — BP 114/72 | HR 96 | Temp 98.7°F | Wt 244.2 lb

## 2015-04-25 DIAGNOSIS — J020 Streptococcal pharyngitis: Secondary | ICD-10-CM

## 2015-04-25 LAB — POCT RAPID STREP A (OFFICE): RAPID STREP A SCREEN: NEGATIVE

## 2015-04-25 MED ORDER — PENICILLIN V POTASSIUM 500 MG PO TABS: 500.0000 mg | ORAL_TABLET | Freq: Three times a day (TID) | ORAL | Status: DC

## 2015-04-25 NOTE — Progress Notes (Signed)
Pre visit review using our clinic review tool, if applicable. No additional management support is needed unless otherwise documented below in the visit note. 

## 2015-04-25 NOTE — Patient Instructions (Signed)

## 2015-04-25 NOTE — Addendum Note (Signed)
Addended by: Modena Nunnery on: 04/25/2015 11:01 AM   Modules accepted: Orders

## 2015-04-25 NOTE — Progress Notes (Signed)
SUBJECTIVE: 29 y.o. female with sore throat, myalgias, swollen glands, headache and fever for 1 day. No history of rheumatic fever. Other symptoms: sore throat.  Current Outpatient Prescriptions on File Prior to Visit  Medication Sig Dispense Refill  . escitalopram (LEXAPRO) 10 MG tablet 1 tablet by mouth daily x 2 weeks, may increase to 2 tablets by mouth daily after two weeks. 60 tablet 3  . ibuprofen (ADVIL,MOTRIN) 800 MG tablet TAKE 1 TABLET BY MOUTH 3 TIMES DAILY WITH MEALS 90 tablet 0  . norethindrone-ethinyl estradiol 1/35 (ORTHO-NOVUM, NORTREL,CYCLAFEM) tablet Take 1 tablet by mouth daily. 1 Package 11  . omeprazole (PRILOSEC) 40 MG capsule Take 40 mg by mouth daily as needed.     . tizanidine (ZANAFLEX) 2 MG capsule Take 2 mg by mouth as needed.    . valACYclovir (VALTREX) 1000 MG tablet TAKE 1 TABLET BY MOUTH 2 TIMES DAILY. 12 tablet 2   No current facility-administered medications on file prior to visit.    No Known Allergies  Past Medical History  Diagnosis Date  . Depression   . Anxiety   . Migraine   . Back pain     chronic  . Disc disease, degenerative, lumbar or lumbosacral   . Scoliosis     Past Surgical History  Procedure Laterality Date  . Appendectomy    . Breast surgery  2008    lump/ benign    Family History  Problem Relation Age of Onset  . Cancer Mother 46    breast  . Alcohol abuse Father   . Vision loss Maternal Grandmother   . Heart disease Maternal Grandmother   . Cancer Maternal Grandfather   . Alcohol abuse Paternal Grandfather   . Arthritis Maternal Aunt     Social History   Social History  . Marital Status: Single    Spouse Name: N/A  . Number of Children: 1  . 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 Smoking status: Former Smoker    Types: Cigarettes  . Smokeless tobacco: Never Used     Comment: 3 cigarettes a day  . Alcohol Use: 0.0 oz/week    0 Standard drinks or equivalent  per week     Comment: occasional  . Drug Use: No  . Sexual Activity: No   Other Topics Concern  . Not on file   Social History Narrative    OBJECTIVE:  BP 114/72 mmHg  Pulse 96  Temp(Src) 98.7 F (37.1 C) (Oral)  Wt 244 lb 4 oz (110.791 kg)  SpO2 98%  Vitals as noted above. Appears alert, well appearing, and in no distress. Ears: bilateral TM's and external ear canals normal Oropharynx: mucous membranes moist, pharynx normal without lesions and tonsils hypertrophied with exudate Neck: supple, no significant adenopathy Lungs: clear to auscultation, no wheezes, rales or rhonchi, symmetric air entry Rapid Strep test is negative  ASSESSMENT: Streptococcal pharyngitis  PLAN: Rapid strep neg but has all classical signs and symptoms. Per orders. Gargle, use acetaminophen or other OTC analgesic, and take Rx fully as prescribed. Call if other family members develop similar symptoms. See prn.

## 2015-05-04 ENCOUNTER — Telehealth: Payer: Self-pay | Admitting: Family Medicine

## 2015-05-04 DIAGNOSIS — R5383 Other fatigue: Secondary | ICD-10-CM

## 2015-05-04 NOTE — Telephone Encounter (Signed)
Order placed

## 2015-05-04 NOTE — Telephone Encounter (Signed)
Lm on pts vm and informed her orders have been placed for labs

## 2015-05-04 NOTE — Telephone Encounter (Signed)
Spoke to pt who states she is still having sore throat, but is now on the L side. She is questioning if she has mono and is wanting to know if orders can be placed so that she can come on her lunch at 1230 to have labs drawn

## 2015-05-04 NOTE — Telephone Encounter (Signed)
[phone] Pt requesting call back regarding appt from last week  Thank you

## 2015-05-24 ENCOUNTER — Telehealth: Payer: Self-pay | Admitting: Family Medicine

## 2015-05-24 MED ORDER — ESCITALOPRAM OXALATE 10 MG PO TABS: ORAL_TABLET | ORAL | Status: DC

## 2015-05-24 NOTE — Telephone Encounter (Signed)
Rx sent to requested pharmacy

## 2015-05-24 NOTE — Telephone Encounter (Signed)
Pt called asking if she needed to have an appt for a med refill on her Lexapro. She took her last 2 this morning. Please call her at her work number [phone].

## 2015-07-27 ENCOUNTER — Other Ambulatory Visit: Payer: Self-pay | Admitting: Family Medicine

## 2015-08-23 ENCOUNTER — Encounter: Payer: Self-pay | Admitting: Physician Assistant

## 2015-08-23 ENCOUNTER — Ambulatory Visit: Payer: Self-pay | Admitting: Physician Assistant

## 2015-08-23 VITALS — BP 130/90 | HR 80 | Temp 99.4°F

## 2015-08-23 DIAGNOSIS — B029 Zoster without complications: Secondary | ICD-10-CM

## 2015-08-23 MED ORDER — METHYLPREDNISOLONE 4 MG PO TBPK: ORAL_TABLET | ORAL | Status: AC

## 2015-08-23 MED ORDER — FAMCICLOVIR 500 MG PO TABS: 500.0000 mg | ORAL_TABLET | Freq: Three times a day (TID) | ORAL | Status: DC

## 2015-08-23 NOTE — Progress Notes (Signed)
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

## 2015-08-25 ENCOUNTER — Encounter: Payer: Self-pay | Admitting: Physician Assistant

## 2015-08-25 ENCOUNTER — Ambulatory Visit: Payer: Self-pay | Admitting: Physician Assistant

## 2015-08-25 VITALS — BP 108/80 | Temp 98.3°F

## 2015-08-25 DIAGNOSIS — B029 Zoster without complications: Secondary | ICD-10-CM

## 2015-08-25 NOTE — Progress Notes (Signed)
S: here for recheck of shingles, area is still itchy, taking meds as rx'd  O: vitals wnl, nad, skin area is still dry, no open vesicles, area is a little pinker than 2 days ago, no drainage, n/v intact  A: shingles recheck  P: since area is dry and not weeping feel it is ok for patient to return to work, she is to avoid close contact with anyone in their 3rd trimester of pregnancy

## 2015-08-25 NOTE — Progress Notes (Signed)
Also c/o cough, has a lot of drainage, cough recently started few days ago, some chest tightness, is on medrol dose pack for shingles  O: lungs c t a, cv rrr  A: uri  P: if mucus darkens will call in antibiotic

## 2015-08-29 ENCOUNTER — Other Ambulatory Visit: Payer: Self-pay | Admitting: Family Medicine

## 2015-08-30 ENCOUNTER — Ambulatory Visit (INDEPENDENT_AMBULATORY_CARE_PROVIDER_SITE_OTHER): Payer: 59 | Admitting: Family Medicine

## 2015-08-30 ENCOUNTER — Other Ambulatory Visit (HOSPITAL_COMMUNITY)
Admission: RE | Admit: 2015-08-30 | Discharge: 2015-08-30 | Disposition: A | Discharge status: Home / Self Care | Payer: 59 | Source: Ambulatory Visit | Attending: Family Medicine | Admitting: Family Medicine

## 2015-08-30 ENCOUNTER — Encounter: Payer: Self-pay | Admitting: Family Medicine

## 2015-08-30 VITALS — BP 118/70 | HR 92 | Temp 98.6°F | Ht 70.0 in | Wt 224.2 lb

## 2015-08-30 DIAGNOSIS — N760 Acute vaginitis: Secondary | ICD-10-CM | POA: Diagnosis not present

## 2015-08-30 DIAGNOSIS — Z01419 Encounter for gynecological examination (general) (routine) without abnormal findings: Secondary | ICD-10-CM | POA: Diagnosis not present

## 2015-08-30 DIAGNOSIS — F418 Other specified anxiety disorders: Secondary | ICD-10-CM

## 2015-08-30 DIAGNOSIS — B029 Zoster without complications: Secondary | ICD-10-CM

## 2015-08-30 DIAGNOSIS — Z113 Encounter for screening for infections with a predominantly sexual mode of transmission: Secondary | ICD-10-CM | POA: Diagnosis not present

## 2015-08-30 DIAGNOSIS — E669 Obesity, unspecified: Secondary | ICD-10-CM | POA: Diagnosis not present

## 2015-08-30 DIAGNOSIS — Z0000 Encounter for general adult medical examination without abnormal findings: Secondary | ICD-10-CM | POA: Diagnosis not present

## 2015-08-30 DIAGNOSIS — F329 Major depressive disorder, single episode, unspecified: Secondary | ICD-10-CM

## 2015-08-30 DIAGNOSIS — Z1151 Encounter for screening for human papillomavirus (HPV): Secondary | ICD-10-CM | POA: Insufficient documentation

## 2015-08-30 DIAGNOSIS — F419 Anxiety disorder, unspecified: Secondary | ICD-10-CM

## 2015-08-30 LAB — CBC WITH DIFFERENTIAL/PLATELET
BASOS ABS: 0.1 10*3/uL (ref 0.0–0.1)
BASOS PCT: 1 % (ref 0.0–3.0)
EOS ABS: 0.3 10*3/uL (ref 0.0–0.7)
Eosinophils Relative: 4.5 % (ref 0.0–5.0)
HCT: 42.4 % (ref 36.0–46.0)
HEMOGLOBIN: 14.4 g/dL (ref 12.0–15.0)
LYMPHS PCT: 51.8 % — AB (ref 12.0–46.0)
Lymphs Abs: 3.1 10*3/uL (ref 0.7–4.0)
MCHC: 34 g/dL (ref 30.0–36.0)
MCV: 89.7 fl (ref 78.0–100.0)
MONO ABS: 0.5 10*3/uL (ref 0.1–1.0)
Monocytes Relative: 8.5 % (ref 3.0–12.0)
Neutro Abs: 2.1 10*3/uL (ref 1.4–7.7)
Neutrophils Relative %: 34.2 % — ABNORMAL LOW (ref 43.0–77.0)
Platelets: 300 10*3/uL (ref 150.0–400.0)
RBC: 4.73 Mil/uL (ref 3.87–5.11)
RDW: 13.5 % (ref 11.5–15.5)
WBC: 6 10*3/uL (ref 4.0–10.5)

## 2015-08-30 LAB — LIPID PANEL
Cholesterol: 165 mg/dL (ref 0–200)
HDL: 48.9 mg/dL (ref >39.00)
LDL Cholesterol: 98 mg/dL (ref 0–99)
NONHDL: 116.31
Total CHOL/HDL Ratio: 3
Triglycerides: 91 mg/dL (ref 0.0–149.0)
VLDL: 18.2 mg/dL (ref 0.0–40.0)

## 2015-08-30 LAB — COMPREHENSIVE METABOLIC PANEL
ALK PHOS: 47 U/L (ref 39–117)
ALT: 37 U/L — AB (ref 0–35)
AST: 18 U/L (ref 0–37)
Albumin: 4.2 g/dL (ref 3.5–5.2)
BILIRUBIN TOTAL: 0.5 mg/dL (ref 0.2–1.2)
BUN: 10 mg/dL (ref 6–23)
CO2: 29 mEq/L (ref 19–32)
Calcium: 9.4 mg/dL (ref 8.4–10.5)
Chloride: 105 mEq/L (ref 96–112)
Creatinine, Ser: 0.83 mg/dL (ref 0.40–1.20)
GFR: 86.1 mL/min (ref >60.00)
GLUCOSE: 89 mg/dL (ref 70–99)
Potassium: 4.3 mEq/L (ref 3.5–5.1)
SODIUM: 140 meq/L (ref 135–145)
TOTAL PROTEIN: 7.2 g/dL (ref 6.0–8.3)

## 2015-08-30 LAB — TSH: TSH: 0.67 u[IU]/mL (ref 0.35–4.50)

## 2015-08-30 LAB — HEMOGLOBIN A1C: HEMOGLOBIN A1C: 6 % (ref 4.6–6.5)

## 2015-08-30 NOTE — Progress Notes (Signed)
Subjective:   Patient ID: Autumn [name], female    DOB: 02/27/1986, 30 y.o.   MRN: [id number]  BAYA PRESSLER is a pleasant 30 y.o. year old female who presents to clinic today with Annual Exam and Herpes Zoster  on 08/30/2015  HPI:  G1P1- no h/o abnormal pap smears.  Last pap smear was done by me on 08/19/14.  Denies any current dysuria or abnormal discharge. Does have h/o chlamydia 06/2007.  No known family history of uterine or cervical CA. Mom had a type of "early breast CA" s/p lumpectomy.  Per pt, GYN at the time recommended that Aleksandrova should have first mammogram at age 63.    Anxiety/depression-  Symptoms well controlled on current dose of lexapro.  Was diagnosed with shingles over her left buttocks last week. Treated with valtrex. Feels better.  Still having some itching and tingling.  Obesity- has lost over 20 pounds!   Has been using phentermine prescribed at employee health. Wt Readings from Last 3 Encounters:  08/30/15 224 lb 4 oz (101.719 kg)  04/25/15 244 lb 4 oz (110.791 kg)  01/11/15 239 lb 12 oz (108.75 kg)   Lab Results  Component Value Date   CHOL 151 08/19/2014   HDL 42.90 08/19/2014   LDLCALC 90 08/19/2014   TRIG 93.0 08/19/2014   CHOLHDL 4 08/19/2014   Lab Results  Component Value Date   CREATININE 0.79 08/19/2014   Lab Results  Component Value Date   TSH 1.07 08/19/2014   Lab Results  Component Value Date   WBC 6.4 08/19/2014   HGB 15.0 08/19/2014   HCT 43.6 08/19/2014   MCV 89.3 08/19/2014   PLT 294.0 08/19/2014     Current Outpatient Prescriptions on File Prior to Visit  Medication Sig Dispense Refill  . ALAYCEN 1/35 tablet TAKE 1 TABLET BY MOUTH DAILY. 28 tablet 11  . escitalopram (LEXAPRO) 10 MG tablet TAKE 2 TABLETS BY MOUTH DAILY 60 tablet 2  . famciclovir (FAMVIR) 500 MG tablet Take 1 tablet (500 mg total) by mouth 3 (three) times daily. 21 tablet 0  . ibuprofen (ADVIL,MOTRIN) 800 MG tablet TAKE 1  TABLET BY MOUTH 3 TIMES DAILY WITH MEALS 90 tablet 0  . phentermine (ADIPEX-P) 37.5 MG tablet Take 37.5 mg by mouth daily before breakfast.    . valACYclovir (VALTREX) 1000 MG tablet TAKE 1 TABLET BY MOUTH 2 TIMES DAILY. 12 tablet 2   No current facility-administered medications on file prior to visit.    No Known Allergies  Past Medical History  Diagnosis Date  . Depression   . Anxiety   . Migraine   . Back pain     chronic  . Disc disease, degenerative, lumbar or lumbosacral   . Scoliosis     Past Surgical History  Procedure Laterality Date  . Appendectomy    . Breast surgery  2008    lump/ benign    Family History  Problem Relation Age of Onset  . Cancer Mother 45    breast  . Alcohol abuse Father   . Vision loss Maternal Grandmother   . Heart disease Maternal Grandmother   . Cancer Maternal Grandfather   . Alcohol abuse Paternal Grandfather   . Arthritis Maternal Aunt     Social History   Social History  . Marital Status: Single    Spouse Name: N/A  . Number of Children: 1  . Years of Education: N/A   Occupational History  . CMA  Social History Main Topics  . Smoking status: Former Smoker    Types: Cigarettes  . Smokeless tobacco: Never Used     Comment: 3 cigarettes a day  . Alcohol Use: 0.0 oz/week    0 Standard drinks or equivalent per week     Comment: occasional  . Drug Use: No  . Sexual Activity: No   Other Topics Concern  . Not on file   Social History Narrative   The PMH, PSH, Social History, Family History, Medications, and allergies have been reviewed in [facility], and have been updated if relevant.   Review of Systems  Constitutional: Negative.   HENT: Negative.   Respiratory: Negative.   Cardiovascular: Negative.   Gastrointestinal: Negative.   Endocrine: Negative.   Genitourinary: Negative.   Musculoskeletal: Negative.   Skin: Positive for rash.  Allergic/Immunologic: Negative.   Neurological: Negative.   Hematological:  Negative.   Psychiatric/Behavioral: Negative.   All other systems reviewed and are negative.      Objective:    BP 118/70 mmHg  Pulse 92  Temp(Src) 98.6 F (37 C) (Oral)  Ht 5\' 10"  (1.778 m)  Wt 224 lb 4 oz (101.719 kg)  BMI 32.18 kg/m2  SpO2 97%   Physical Exam   General:  Well-developed,well-nourished,in no acute distress; alert,appropriate and cooperative throughout examination Head:  normocephalic and atraumatic.   Eyes:  vision grossly intact, pupils equal, pupils round, and pupils reactive to light.   Ears:  R ear normal and L ear normal.   Nose:  no external deformity.   Mouth:  good dentition.   Neck:  No deformities, masses, or tenderness noted. Breasts:  No mass, nodules, thickening, tenderness, bulging, retraction, inflamation, nipple discharge or skin changes noted.   Lungs:  Normal respiratory effort, chest expands symmetrically. Lungs are clear to auscultation, no crackles or wheezes. Heart:  Normal rate and regular rhythm. S1 and S2 normal without gallop, murmur, click, rub or other extra sounds. Abdomen:  Bowel sounds positive,abdomen soft and non-tender without masses, organomegaly or hernias noted. Rectal:  no external abnormalities.   Genitalia:  Pelvic Exam:        External: normal female genitalia without lesions or masses        Vagina: normal without lesions or masses        Cervix: normal without lesions or masses        Adnexa: normal bimanual exam without masses or fullness        Uterus: normal by palpation        Pap smear: performed Msk:  No deformity or scoliosis noted of thoracic or lumbar spine.   Extremities:  No clubbing, cyanosis, edema, or deformity noted with normal full range of motion of all joints.   Neurologic:  alert & oriented X3 and gait normal.   Skin:  Intact without suspicious lesions or rashes Cervical Nodes:  No lymphadenopathy noted Axillary Nodes:  No palpable lymphadenopathy Psych:  Cognition and judgment appear intact.  Alert and cooperative with normal attention span and concentration. No apparent delusions, illusions, hallucinations       Assessment & Plan:   Well woman exam  Anxiety and depression No Follow-up on file.

## 2015-08-30 NOTE — Assessment & Plan Note (Signed)
Well controlled on current dose of lexapro. No changes made to rx today.

## 2015-08-30 NOTE — Assessment & Plan Note (Signed)
Reviewed preventive care protocols, scheduled due services, and updated immunizations Discussed nutrition, exercise, diet, and healthy lifestyle.  Orders Placed This Encounter  Procedures  . CBC with Differential/Platelet  . Comprehensive metabolic panel  . Lipid panel  . TSH  . Hemoglobin A1c   Pap smear done today.

## 2015-08-30 NOTE — Assessment & Plan Note (Signed)
Rash  resolving.

## 2015-08-30 NOTE — Progress Notes (Signed)
Pre visit review using our clinic review tool, if applicable. No additional management support is needed unless otherwise documented below in the visit note. 

## 2015-08-30 NOTE — Assessment & Plan Note (Signed)
Improving. Encouraged her to keep up the good work.

## 2015-08-30 NOTE — Addendum Note (Signed)
Addended by: Modena Nunnery on: 08/30/2015 08:56 AM   Modules accepted: Orders

## 2015-08-31 ENCOUNTER — Encounter: Payer: Self-pay | Admitting: *Deleted

## 2015-09-01 LAB — CYTOLOGY - PAP

## 2015-09-02 LAB — CERVICOVAGINAL ANCILLARY ONLY
BACTERIAL VAGINITIS: NEGATIVE
Candida vaginitis: NEGATIVE
HERPES (WINDOWPATH): NEGATIVE

## 2015-09-05 ENCOUNTER — Encounter: Payer: Self-pay | Admitting: *Deleted

## 2015-11-02 ENCOUNTER — Encounter: Payer: Self-pay | Admitting: Primary Care

## 2015-11-02 ENCOUNTER — Ambulatory Visit (INDEPENDENT_AMBULATORY_CARE_PROVIDER_SITE_OTHER): Payer: 59 | Admitting: Primary Care

## 2015-11-02 VITALS — BP 118/78 | HR 95 | Temp 98.5°F | Ht 70.0 in | Wt 222.1 lb

## 2015-11-02 DIAGNOSIS — J069 Acute upper respiratory infection, unspecified: Secondary | ICD-10-CM | POA: Diagnosis not present

## 2015-11-02 NOTE — Progress Notes (Signed)
Pre visit review using our clinic review tool, if applicable. No additional management support is needed unless otherwise documented below in the visit note. 

## 2015-11-02 NOTE — Progress Notes (Signed)
Subjective:    Patient ID: Autumn [name], female    DOB: 12-09-1985, 30 y.o.   MRN: [id number]  HPI  [name] is a 30 year old female who presents today with a chief complaint of cough. She also reports sore throat, nasal congestion, headache, sinus pressure, chills, body aches. Her symptoms began Thursday May 18th with voice hoarseness. Her symptoms have progressed since. Her cough is productive with yellow sputum. She's taken Mucinex DM, Nyquil, Ibuprofen, and Claritin/Zyrtec.   Denies nausea, vomiting, fevers. Her voice and cough is overall improved starting yesterday and she's overall feeling better.  Review of Systems  Constitutional: Positive for chills and fatigue. Negative for fever.  HENT: Positive for congestion, postnasal drip and sinus pressure. Negative for sore throat.   Respiratory: Positive for cough. Negative for shortness of breath.   Cardiovascular: Negative for chest pain.  Musculoskeletal: Positive for myalgias.  Allergic/Immunologic: Positive for environmental allergies.       Past Medical History  Diagnosis Date  . Depression   . Anxiety   . Migraine   . Back pain     chronic  . Disc disease, degenerative, lumbar or lumbosacral   . Scoliosis      Social History   Social History  . Marital Status: Single    Spouse Name: N/A  . Number of Children: 1  . 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 Smoking status: Former Smoker    Types: Cigarettes  . Smokeless tobacco: Never Used     Comment: 3 cigarettes a day  . Alcohol Use: 0.0 oz/week    0 Standard drinks or equivalent per week     Comment: occasional  . Drug Use: No  . Sexual Activity: No   Other Topics Concern  . Not on file   Social History Narrative    Past Surgical History  Procedure Laterality Date  . Appendectomy    . Breast surgery  2008    lump/ benign    Family History  Problem Relation Age of Onset  .  Cancer Mother 26    breast  . Alcohol abuse Father   . Vision loss Maternal Grandmother   . Heart disease Maternal Grandmother   . Cancer Maternal Grandfather   . Alcohol abuse Paternal Grandfather   . Arthritis Maternal Aunt     No Known Allergies  Current Outpatient Prescriptions on File Prior to Visit  Medication Sig Dispense Refill  . ALAYCEN 1/35 tablet TAKE 1 TABLET BY MOUTH DAILY. 28 tablet 11  . escitalopram (LEXAPRO) 10 MG tablet TAKE 2 TABLETS BY MOUTH DAILY 60 tablet 2  . ibuprofen (ADVIL,MOTRIN) 800 MG tablet TAKE 1 TABLET BY MOUTH 3 TIMES DAILY WITH MEALS 90 tablet 0  . valACYclovir (VALTREX) 1000 MG tablet TAKE 1 TABLET BY MOUTH 2 TIMES DAILY. 12 tablet 2   No current facility-administered medications on file prior to visit.    BP 118/78 mmHg  Pulse 95  Temp(Src) 98.5 F (36.9 C) (Oral)  Ht 5\' 10"  (1.778 m)  Wt 222 lb 1.9 oz (100.753 kg)  BMI 31.87 kg/m2  SpO2 98%    Objective:   Physical Exam  Constitutional: She appears well-nourished.  HENT:  Right Ear: Tympanic membrane and ear canal normal.  Left Ear: Tympanic membrane and ear canal normal.  Nose: Mucosal edema present. Right sinus exhibits no maxillary sinus tenderness and no frontal sinus tenderness. Left  sinus exhibits no maxillary sinus tenderness and no frontal sinus tenderness.  Mouth/Throat: Oropharynx is clear and moist.  Eyes: Conjunctivae are normal.  Neck: Neck supple.  Cardiovascular: Normal rate and regular rhythm.   Pulmonary/Chest: Effort normal and breath sounds normal. She has no wheezes. She has no rales.  Lymphadenopathy:    She has no cervical adenopathy.  Skin: Skin is warm and dry.          Assessment & Plan:  URI:  Likely caused by allergies.  Overall feeling improved. Dry cough during exam. Does not appear acutely ill. Lungs clear which is reassuring. Suspect viral involvement and will treat with supportive measures. Delsym, Flonase, antihistamine. Return  precautions provided.

## 2015-11-02 NOTE — Patient Instructions (Signed)
Your symptoms are related to a viral infection that will pass on its own.  Nasal Congestion: Try using Flonase (fluticasone) nasal spray. Instill 2 sprays in each nostril once daily. This may be purchased over the counter.  Cough: Try Delsym. This may be purchased over the counter.  Continue Zyrtec and ibuprofen.  Please notify me if you develop persistent fevers of 101, start coughing up green mucous consistently, notice increased fatigue or weakness, or feel worse.  Increase consumption of water intake and rest.  It was a pleasure meeting you!  Upper Respiratory Infection, Adult Most upper respiratory infections (URIs) are a viral infection of the air passages leading to the lungs. A URI affects the nose, throat, and upper air passages. The most common type of URI is nasopharyngitis and is typically referred to as "the common cold." URIs run their course and usually go away on their own. Most of the time, a URI does not require medical attention, but sometimes a bacterial infection in the upper airways can follow a viral infection. This is called a secondary infection. Sinus and middle ear infections are common types of secondary upper respiratory infections. Bacterial pneumonia can also complicate a URI. A URI can worsen asthma and chronic obstructive pulmonary disease (COPD). Sometimes, these complications can require emergency medical care and may be life threatening.  CAUSES Almost all URIs are caused by viruses. A virus is a type of germ and can spread from one person to another.  RISKS FACTORS You may be at risk for a URI if:   You smoke.   You have chronic heart or lung disease.  You have a weakened defense (immune) system.   You are very young or very old.   You have nasal allergies or asthma.  You work in crowded or poorly ventilated areas.  You work in health care facilities or schools. SIGNS AND SYMPTOMS  Symptoms typically develop 2-3 days after you come in  contact with a cold virus. Most viral URIs last 7-10 days. However, viral URIs from the influenza virus (flu virus) can last 14-18 days and are typically more severe. Symptoms may include:   Runny or stuffy (congested) nose.   Sneezing.   Cough.   Sore throat.   Headache.   Fatigue.   Fever.   Loss of appetite.   Pain in your forehead, behind your eyes, and over your cheekbones (sinus pain).  Muscle aches.  DIAGNOSIS  Your health care provider may diagnose a URI by:  Physical exam.  Tests to check that your symptoms are not due to another condition such as:  Strep throat.  Sinusitis.  Pneumonia.  Asthma. TREATMENT  A URI goes away on its own with time. It cannot be cured with medicines, but medicines may be prescribed or recommended to relieve symptoms. Medicines may help:  Reduce your fever.  Reduce your cough.  Relieve nasal congestion. HOME CARE INSTRUCTIONS   Take medicines only as directed by your health care provider.   Gargle warm saltwater or take cough drops to comfort your throat as directed by your health care provider.  Use a warm mist humidifier or inhale steam from a shower to increase air moisture. This may make it easier to breathe.  Drink enough fluid to keep your urine clear or pale yellow.   Eat soups and other clear broths and maintain good nutrition.   Rest as needed.   Return to work when your temperature has returned to normal or as your health  care provider advises. You may need to stay home longer to avoid infecting others. You can also use a face mask and careful hand washing to prevent spread of the virus.  Increase the usage of your inhaler if you have asthma.   Do not use any tobacco products, including cigarettes, chewing tobacco, or electronic cigarettes. If you need help quitting, ask your health care provider. PREVENTION  The best way to protect yourself from getting a cold is to practice good hygiene.    Avoid oral or hand contact with people with cold symptoms.   Wash your hands often if contact occurs.  There is no clear evidence that vitamin C, vitamin E, echinacea, or exercise reduces the chance of developing a cold. However, it is always recommended to get plenty of rest, exercise, and practice good nutrition.  SEEK MEDICAL CARE IF:   You are getting worse rather than better.   Your symptoms are not controlled by medicine.   You have chills.  You have worsening shortness of breath.  You have brown or red mucus.  You have yellow or brown nasal discharge.  You have pain in your face, especially when you bend forward.  You have a fever.  You have swollen neck glands.  You have pain while swallowing.  You have white areas in the back of your throat. SEEK IMMEDIATE MEDICAL CARE IF:   You have severe or persistent:  Headache.  Ear pain.  Sinus pain.  Chest pain.  You have chronic lung disease and any of the following:  Wheezing.  Prolonged cough.  Coughing up blood.  A change in your usual mucus.  You have a stiff neck.  You have changes in your:  Vision.  Hearing.  Thinking.  Mood. MAKE SURE YOU:   Understand these instructions.  Will watch your condition.  Will get help right away if you are not doing well or get worse.   This information is not intended to replace advice given to you by your health care provider. Make sure you discuss any questions you have with your health care provider.   Document Released: 11/21/2000 Document Revised: 10/12/2014 Document Reviewed: 09/02/2013 Elsevier Interactive Patient Education [employer].

## 2015-12-06 ENCOUNTER — Other Ambulatory Visit: Payer: Self-pay | Admitting: Family Medicine

## 2016-01-02 ENCOUNTER — Ambulatory Visit: Payer: Self-pay | Admitting: Physician Assistant

## 2016-01-06 ENCOUNTER — Ambulatory Visit: Payer: Self-pay | Admitting: Internal Medicine

## 2016-02-02 ENCOUNTER — Other Ambulatory Visit: Payer: Self-pay | Admitting: Emergency Medicine

## 2016-02-02 MED ORDER — SULFAMETHOXAZOLE-TRIMETHOPRIM 800-160 MG PO TABS: 1.0000 | ORAL_TABLET | Freq: Two times a day (BID) | ORAL | 2 refills | Status: DC

## 2016-02-03 ENCOUNTER — Encounter: Payer: Self-pay | Admitting: Family Medicine

## 2016-02-20 ENCOUNTER — Ambulatory Visit (INDEPENDENT_AMBULATORY_CARE_PROVIDER_SITE_OTHER): Payer: 59 | Admitting: Family Medicine

## 2016-02-20 ENCOUNTER — Encounter: Payer: Self-pay | Admitting: Family Medicine

## 2016-02-20 DIAGNOSIS — L089 Local infection of the skin and subcutaneous tissue, unspecified: Secondary | ICD-10-CM

## 2016-02-20 DIAGNOSIS — T148 Other injury of unspecified body region: Secondary | ICD-10-CM

## 2016-02-20 DIAGNOSIS — W57XXXA Bitten or stung by nonvenomous insect and other nonvenomous arthropods, initial encounter: Secondary | ICD-10-CM | POA: Insufficient documentation

## 2016-02-20 MED ORDER — TRIAMCINOLONE ACETONIDE 0.1 % EX CREA: 1.0000 "application " | TOPICAL_CREAM | Freq: Two times a day (BID) | CUTANEOUS | 0 refills | Status: DC

## 2016-02-20 MED ORDER — DOXYCYCLINE HYCLATE 100 MG PO TABS: 100.0000 mg | ORAL_TABLET | Freq: Two times a day (BID) | ORAL | 0 refills | Status: DC

## 2016-02-20 NOTE — Progress Notes (Signed)
Subjective:   Patient ID: Autumn [name], female    DOB: [birth year], 30 y.o.   MRN: [id number]  Autumn [name] is a pleasant 30 y.o. year old female who presents to clinic today with red spot on calf  on 02/20/2016  HPI:  Woke up yesterday with what looked like a bug bite on her right calf.  Since then, more itchy and now has a red, warm area around it. Feels a little tight, not painful.  No n/v No fevers.  Has not put  Anything on it or taken anything by mouth for it.  [place] Not sure what bit her.  Current Outpatient Prescriptions on File Prior to Visit  Medication Sig Dispense Refill  . ALAYCEN 1/35 tablet TAKE 1 TABLET BY MOUTH DAILY. 28 tablet 11  . escitalopram (LEXAPRO) 10 MG tablet TAKE 2 TABLETS BY MOUTH DAILY 180 tablet 1  . ibuprofen (ADVIL,MOTRIN) 800 MG tablet TAKE 1 TABLET BY MOUTH 3 TIMES DAILY WITH MEALS 90 tablet 0  . valACYclovir (VALTREX) 1000 MG tablet TAKE 1 TABLET BY MOUTH 2 TIMES DAILY. 12 tablet 2   No current facility-administered medications on file prior to visit.     No Known Allergies  Past Medical History:  Diagnosis Date  . Anxiety   . Back pain    chronic  . Depression   . Disc disease, degenerative, lumbar or lumbosacral   . Migraine   . Scoliosis     Past Surgical History:  Procedure Laterality Date  . APPENDECTOMY    . BREAST SURGERY  2008   lump/ benign    Family History  Problem Relation Age of Onset  . Cancer Mother 8    breast  . Alcohol abuse Father   . Vision loss Maternal Grandmother   . Heart disease Maternal Grandmother   . Cancer Maternal Grandfather   . Alcohol abuse Paternal Grandfather   . Arthritis Maternal Aunt     Social History   Social History  . Marital status: Single    Spouse name: N/A  . Number of children: 1  . Years of education: N/A   Occupational History  . CMA    Social History Main Topics  . Smoking status: Former Smoker    Types:  Cigarettes  . Smokeless tobacco: Never Used     Comment: 3 cigarettes a day  . Alcohol use 0.0 oz/week     Comment: occasional  . Drug use: No  . Sexual activity: No   Other Topics Concern  . Not on file   Social History Narrative  . No narrative on file   The PMH, PSH, Social History, Family History, Medications, and allergies have been reviewed in [facility], and have been updated if relevant.   Review of Systems  Respiratory: Negative.   Cardiovascular: Negative.   Gastrointestinal: Negative.   Skin: Positive for rash.  All other systems reviewed and are negative.      Objective:    BP 116/74   Pulse 94   Temp 98.4 F (36.9 C) (Oral)   Wt 226 lb 4 oz (102.6 kg)   SpO2 98%   BMI 32.46 kg/m    Physical Exam  Constitutional: She is oriented to person, place, and time. She appears well-developed and well-nourished. No distress.  HENT:  Head: Normocephalic.  Eyes: Conjunctivae are normal.  Cardiovascular: Normal rate.   Pulmonary/Chest: Effort normal.  Neurological: She is alert and oriented to person, place,  and time. No cranial nerve deficit.  Skin: She is not diaphoretic.     Psychiatric: She has a normal mood and affect. Her behavior is normal. Judgment and thought content normal.  Nursing note and vitals reviewed.         Assessment & Plan:   Bug bite with infection No Follow-up on file.

## 2016-02-20 NOTE — Assessment & Plan Note (Signed)
New- with cellulitis, no abscess Doxycyline 100 mg twice daily x 10 days, As needed triamcinolone. Call or return to clinic prn if these symptoms worsen or fail to improve as anticipated. The patient indicates understanding of these issues and agrees with the plan.

## 2016-02-20 NOTE — Progress Notes (Signed)
Pre visit review using our clinic review tool, if applicable. No additional management support is needed unless otherwise documented below in the visit note. 

## 2016-05-04 DIAGNOSIS — H5212 Myopia, left eye: Secondary | ICD-10-CM | POA: Diagnosis not present

## 2016-05-04 DIAGNOSIS — H5211 Myopia, right eye: Secondary | ICD-10-CM | POA: Diagnosis not present

## 2016-05-04 DIAGNOSIS — H52222 Regular astigmatism, left eye: Secondary | ICD-10-CM | POA: Diagnosis not present

## 2016-05-31 ENCOUNTER — Other Ambulatory Visit: Payer: Self-pay | Admitting: Family Medicine

## 2016-06-01 ENCOUNTER — Other Ambulatory Visit: Payer: Self-pay | Admitting: Family Medicine

## 2016-08-30 ENCOUNTER — Ambulatory Visit (INDEPENDENT_AMBULATORY_CARE_PROVIDER_SITE_OTHER): Payer: 59 | Admitting: Family Medicine

## 2016-08-30 ENCOUNTER — Telehealth: Payer: Self-pay

## 2016-08-30 ENCOUNTER — Encounter: Payer: Self-pay | Admitting: Family Medicine

## 2016-08-30 ENCOUNTER — Telehealth: Payer: Self-pay | Admitting: Family Medicine

## 2016-08-30 VITALS — BP 122/82 | HR 72 | Temp 98.2°F | Ht 70.0 in | Wt 226.0 lb

## 2016-08-30 DIAGNOSIS — Z309 Encounter for contraceptive management, unspecified: Secondary | ICD-10-CM | POA: Diagnosis not present

## 2016-08-30 DIAGNOSIS — Z803 Family history of malignant neoplasm of breast: Secondary | ICD-10-CM

## 2016-08-30 DIAGNOSIS — R002 Palpitations: Secondary | ICD-10-CM

## 2016-08-30 DIAGNOSIS — Z01419 Encounter for gynecological examination (general) (routine) without abnormal findings: Secondary | ICD-10-CM

## 2016-08-30 DIAGNOSIS — F418 Other specified anxiety disorders: Secondary | ICD-10-CM

## 2016-08-30 DIAGNOSIS — Z1239 Encounter for other screening for malignant neoplasm of breast: Secondary | ICD-10-CM

## 2016-08-30 DIAGNOSIS — F419 Anxiety disorder, unspecified: Secondary | ICD-10-CM

## 2016-08-30 DIAGNOSIS — F329 Major depressive disorder, single episode, unspecified: Secondary | ICD-10-CM

## 2016-08-30 LAB — COMPREHENSIVE METABOLIC PANEL
ALK PHOS: 58 U/L (ref 39–117)
ALT: 36 U/L — AB (ref 0–35)
AST: 23 U/L (ref 0–37)
Albumin: 4.3 g/dL (ref 3.5–5.2)
BILIRUBIN TOTAL: 0.5 mg/dL (ref 0.2–1.2)
BUN: 11 mg/dL (ref 6–23)
CO2: 29 mEq/L (ref 19–32)
CREATININE: 0.83 mg/dL (ref 0.40–1.20)
Calcium: 9.6 mg/dL (ref 8.4–10.5)
Chloride: 103 mEq/L (ref 96–112)
GFR: 85.52 mL/min (ref >60.00)
GLUCOSE: 99 mg/dL (ref 70–99)
Potassium: 4.2 mEq/L (ref 3.5–5.1)
Sodium: 138 mEq/L (ref 135–145)
TOTAL PROTEIN: 7.4 g/dL (ref 6.0–8.3)

## 2016-08-30 LAB — LIPID PANEL
CHOL/HDL RATIO: 3
Cholesterol: 180 mg/dL (ref 0–200)
HDL: 62 mg/dL (ref >39.00)
LDL Cholesterol: 96 mg/dL (ref 0–99)
NONHDL: 118.25
Triglycerides: 112 mg/dL (ref 0.0–149.0)
VLDL: 22.4 mg/dL (ref 0.0–40.0)

## 2016-08-30 LAB — CBC WITH DIFFERENTIAL/PLATELET
BASOS ABS: 0.1 10*3/uL (ref 0.0–0.1)
Basophils Relative: 1.3 % (ref 0.0–3.0)
EOS ABS: 0.1 10*3/uL (ref 0.0–0.7)
Eosinophils Relative: 2.2 % (ref 0.0–5.0)
HEMATOCRIT: 42.7 % (ref 36.0–46.0)
Hemoglobin: 14.7 g/dL (ref 12.0–15.0)
LYMPHS ABS: 2.5 10*3/uL (ref 0.7–4.0)
LYMPHS PCT: 40.2 % (ref 12.0–46.0)
MCHC: 34.4 g/dL (ref 30.0–36.0)
MCV: 88.8 fl (ref 78.0–100.0)
MONOS PCT: 6.8 % (ref 3.0–12.0)
Monocytes Absolute: 0.4 10*3/uL (ref 0.1–1.0)
Neutro Abs: 3 10*3/uL (ref 1.4–7.7)
Neutrophils Relative %: 49.5 % (ref 43.0–77.0)
Platelets: 289 10*3/uL (ref 150.0–400.0)
RBC: 4.81 Mil/uL (ref 3.87–5.11)
RDW: 12.7 % (ref 11.5–15.5)
WBC: 6.1 10*3/uL (ref 4.0–10.5)

## 2016-08-30 LAB — HEMOGLOBIN A1C: HEMOGLOBIN A1C: 5.9 % (ref 4.6–6.5)

## 2016-08-30 LAB — TSH: TSH: 1.69 u[IU]/mL (ref 0.35–4.50)

## 2016-08-30 MED ORDER — NORETHINDRONE-ETH ESTRADIOL 1-35 MG-MCG PO TABS: 1.0000 | ORAL_TABLET | Freq: Every day | ORAL | 11 refills | Status: DC

## 2016-08-30 MED ORDER — ESCITALOPRAM OXALATE 20 MG PO TABS: 20.0000 mg | ORAL_TABLET | Freq: Every day | ORAL | 3 refills | Status: DC

## 2016-08-30 MED ORDER — VALACYCLOVIR HCL 1 G PO TABS: 1000.0000 mg | ORAL_TABLET | Freq: Two times a day (BID) | ORAL | 2 refills | Status: DC

## 2016-08-30 NOTE — Progress Notes (Addendum)
Subjective:   Patient ID: Autumn [name], female    DOB: 23-Feb-1986, 31 y.o.   MRN: [id number]  Autumn [name] is a pleasant 31 y.o. year old female who presents to clinic today with Annual Exam  on 08/30/2016  HPI: G1P1- no h/o abnormal pap smears. Last pap smear, done by me on 08/30/15  Getting married in June.  She is very excited.  Happy on current OCPs.  Mom had breast CA at 3.  GYN told her she needs mammogram this year.  Anxiety/depression-  Symptoms well controlled on current dose of lexapro 20 mg daily.  Lab Results  Component Value Date   TSH 0.67 08/30/2015   Lab Results  Component Value Date   WBC 6.0 08/30/2015   HGB 14.4 08/30/2015   HCT 42.4 08/30/2015   MCV 89.7 08/30/2015   PLT 300.0 08/30/2015   Lab Results  Component Value Date   NA 140 08/30/2015   K 4.3 08/30/2015   CL 105 08/30/2015   CO2 29 08/30/2015   Lab Results  Component Value Date   HGBA1C 6.0 08/30/2015     Current Outpatient Prescriptions on File Prior to Visit  Medication Sig Dispense Refill  . ibuprofen (ADVIL,MOTRIN) 800 MG tablet TAKE 1 TABLET BY MOUTH 3 TIMES DAILY WITH MEALS 90 tablet 1   No current facility-administered medications on file prior to visit.     No Known Allergies  Past Medical History:  Diagnosis Date  . Anxiety   . Back pain    chronic  . Depression   . Disc disease, degenerative, lumbar or lumbosacral   . Migraine   . Scoliosis     Past Surgical History:  Procedure Laterality Date  . APPENDECTOMY    . BREAST SURGERY  2008   lump/ benign    Family History  Problem Relation Age of Onset  . Cancer Mother 87    breast  . Alcohol abuse Father   . Vision loss Maternal Grandmother   . Heart disease Maternal Grandmother   . Cancer Maternal Grandfather   . Alcohol abuse Paternal Grandfather   . Arthritis Maternal Aunt     Social History   Social History  . Marital status: Single    Spouse name: N/A  . Number of  children: 1  . Years of education: N/A   Occupational History  . CMA    Social History Main Topics  . Smoking status: Former Smoker    Types: Cigarettes  . Smokeless tobacco: Never Used     Comment: 3 cigarettes a day  . Alcohol use 0.0 oz/week     Comment: occasional  . Drug use: No  . Sexual activity: No   Other Topics Concern  . Not on file   Social History Narrative  . No narrative on file   The PMH, PSH, Social History, Family History, Medications, and allergies have been reviewed in [facility], and have been updated if relevant.   Review of Systems  Constitutional: Negative.   HENT: Negative.   Respiratory: Negative.   Cardiovascular: Negative.   Gastrointestinal: Negative.   Endocrine: Negative.   Genitourinary: Negative.   Musculoskeletal: Negative.   Allergic/Immunologic: Negative.   Neurological: Negative.   Hematological: Negative.   Psychiatric/Behavioral: Negative.   All other systems reviewed and are negative.      Objective:    BP 122/82   Pulse 72   Temp 98.2 F (36.8 C)   Ht 5\' 10"  (1.778 m)  Wt 226 lb (102.5 kg)   SpO2 98%   BMI 32.43 kg/m    Physical Exam   General:  Well-developed,well-nourished,in no acute distress; alert,appropriate and cooperative throughout examination Head:  normocephalic and atraumatic.   Eyes:  vision grossly intact, PERRL Ears:  R ear normal and L ear normal externally, TMs clear bilaterally Nose:  no external deformity.   Mouth:  good dentition.   Neck:  No deformities, masses, or tenderness noted. Breasts:  No mass, nodules, thickening, tenderness, bulging, retraction, inflamation, nipple discharge or skin changes noted.   Lungs:  Normal respiratory effort, chest expands symmetrically. Lungs are clear to auscultation, no crackles or wheezes. Heart:  Normal rate and regular rhythm. S1 and S2 normal without gallop, murmur, click, rub or other extra sounds. Abdomen:  Bowel sounds positive,abdomen soft and  non-tender without masses, organomegaly or hernias noted. Msk:  No deformity or scoliosis noted of thoracic or lumbar spine.   Extremities:  No clubbing, cyanosis, edema, or deformity noted with normal full range of motion of all joints.   Neurologic:  alert & oriented X3 and gait normal.   Skin:  Intact without suspicious lesions or rashes Cervical Nodes:  No lymphadenopathy noted Axillary Nodes:  No palpable lymphadenopathy Psych:  Cognition and judgment appear intact. Alert and cooperative with normal attention span and concentration. No apparent delusions, illusions, hallucinations      Assessment & Plan:   Well woman exam - Plan: CBC with Differential/Platelet, Comprehensive metabolic panel, Lipid panel, TSH, Hemoglobin A1c  Family history of breast cancer - Plan: MM Digital Screening  Anxiety and depression  Encounter for contraceptive management, unspecified type  Family history of breast cancer in mother No Follow-up on file.

## 2016-08-30 NOTE — Telephone Encounter (Signed)
Notified pt about the referral and Dr Deborra Medina instructions

## 2016-08-30 NOTE — Assessment & Plan Note (Signed)
Mammogram ordered

## 2016-08-30 NOTE — Telephone Encounter (Signed)
Order faxed.

## 2016-08-30 NOTE — Telephone Encounter (Signed)
Order entered

## 2016-08-30 NOTE — Telephone Encounter (Signed)
Pt left v/m; pt seen earlier today and pt was to cb if any episodes of tachycardia; pt said per pulse ox HR is high 90's to 112. Pt request cb with what to do.

## 2016-08-30 NOTE — Assessment & Plan Note (Signed)
Reviewed preventive care protocols, scheduled due services, and updated immunizations Discussed nutrition, exercise, diet, and healthy lifestyle.  

## 2016-08-30 NOTE — Assessment & Plan Note (Signed)
Well controlled on current dose of lexapro. No changes made.

## 2016-08-30 NOTE — Telephone Encounter (Signed)
Will refer to cardiology for work up/holter monitor like we discussed in the office today.  If symptoms persist, I do want her to go to the ER.

## 2016-08-30 NOTE — Telephone Encounter (Signed)
Dr Deborra Medina I need screening order to send to [facility] Can you put order in for me Thanks

## 2016-08-30 NOTE — Assessment & Plan Note (Signed)
She is pleased with current OCPs. eRx refills sent.

## 2016-08-30 NOTE — Patient Instructions (Addendum)
Great to see you. Please stop by to see Rosaria Ferries on your way out.  We will call you with your lab results or see them online.   Try OTC unisom.

## 2016-09-10 ENCOUNTER — Other Ambulatory Visit: Payer: Self-pay | Admitting: Family Medicine

## 2016-09-10 ENCOUNTER — Encounter: Payer: Self-pay | Admitting: Family Medicine

## 2016-09-10 MED ORDER — ONDANSETRON HCL 4 MG PO TABS: 4.0000 mg | ORAL_TABLET | Freq: Three times a day (TID) | ORAL | 0 refills | Status: DC | PRN

## 2016-09-20 ENCOUNTER — Ambulatory Visit: Payer: Self-pay | Admitting: Cardiovascular Disease

## 2016-10-11 NOTE — Progress Notes (Signed)
Cardiology Office Note  Date:  10/12/2016   ID:  Autumn [name], DOB 22-Sep-1985, MRN [id number]  PCP:  Arnette Norris, MD   Chief Complaint  Patient presents with  . OTHER    Tachycardia, fatigue and head pressure. Meds reviewed verbally with pt.    HPI:  [name] is a pleasant 31 year old woman who presents by referral from Dr. Deborra Medina for consultation of her tachycardia  PMD visit 08/30/2016: : heart rate 72 bpm  She reports that symptoms of heart racing started back in February 2018: Symptoms predominantly at work, initially she felt that she had Maybe viral syndrome Tired with activity, measured heart rate using a pulse oximeter showing HR 108 She was smoking at the time and has since quit smoking 08/18/2016  Saw Dr. Deborra Medina, Reported having tachycardia, Racing again, HR 116, sometimes lasting for minutes, other times up to an hour   Since feb 2018, she has had 5 to 6 episodes Does not seem to be associated with anxiety Sometimes reports when she has the tachycardia she has Pounding in her head, Rare nausea, sweats When tachycardia is over, she feels drained, tired  Highest rate was measured at 116 bpm  Started at the gym, Using the bicarbonate up to 150 bpm, felt fine Started using elliptical heart rate up to 170 Got nervous it was going too fast, slow down her exercise Eventually stopped but denied having significant symptoms Has noticed higher heart rate with climbing stairs  Notes indicating previous history of Anxiety/depression She is taking lexapro 20 mg daily Notes indicate very vivid dreams for the past 4 months  recurrent over the past 2 years  very disturbing and scary at times.   EKG personally reviewed by myself on todays visit Shows normal sinus rhythm with rate 81 bpm no significant ST or T-wave changes    PMH:   has a past medical history of Anxiety; Back pain; Depression; Disc disease, degenerative, lumbar or lumbosacral; Migraine; and  Scoliosis.  PSH:    Past Surgical History:  Procedure Laterality Date  . APPENDECTOMY    . BREAST SURGERY  2008   lump/ benign    Current Outpatient Prescriptions  Medication Sig Dispense Refill  . escitalopram (LEXAPRO) 20 MG tablet Take 1 tablet (20 mg total) by mouth daily. 90 tablet 3  . ibuprofen (ADVIL,MOTRIN) 800 MG tablet TAKE 1 TABLET BY MOUTH 3 TIMES DAILY WITH MEALS 90 tablet 1  . norethindrone-ethinyl estradiol 1/35 (ALAYCEN 1/35) tablet Take 1 tablet by mouth daily. 28 tablet 11  . ondansetron (ZOFRAN) 4 MG tablet Take 1 tablet (4 mg total) by mouth every 8 (eight) hours as needed for nausea or vomiting. 20 tablet 0  . valACYclovir (VALTREX) 1000 MG tablet Take 1 tablet (1,000 mg total) by mouth 2 (two) times daily. 12 tablet 2  . propranolol (INDERAL) 10 MG tablet Take 1 tablet (10 mg total) by mouth 3 (three) times daily as needed. 90 tablet 1   No current facility-administered medications for this visit.      Allergies:   Patient has no known allergies.   Social History:  The patient  reports that she quit smoking about 7 weeks ago. Her smoking use included Cigarettes. She has never used smokeless tobacco. She reports that she drinks alcohol. She reports that she does not use drugs.   Family History:   family history includes Alcohol abuse in her father and paternal grandfather; Arthritis in her maternal aunt; Cancer in her maternal grandfather;  Cancer (age of onset: 76) in her mother; Heart disease in her maternal grandmother; Heart failure in her maternal grandmother; Vision loss in her maternal grandmother.    Review of Systems: Review of Systems  Constitutional: Negative.        Nausea, sweats, fatigue  Respiratory: Negative.   Cardiovascular: Positive for palpitations.       Tachycardia  Gastrointestinal: Negative.   Musculoskeletal: Negative.   Neurological: Negative.   Psychiatric/Behavioral: The patient is nervous/anxious.   All other systems reviewed  and are negative.    PHYSICAL EXAM: VS:  BP 110/72 (BP Location: Right Arm, Patient Position: Sitting, Cuff Size: Normal)   Pulse 80   Ht 5\' 9"  (1.753 m)   Wt 231 lb 4 oz (104.9 kg)   BMI 34.15 kg/m  , BMI Body mass index is 34.15 kg/m. GEN: Well nourished, well developed, in no acute distress  HEENT: normal  Neck: no JVD, carotid bruits, or masses Cardiac: RRR; no murmurs, rubs, or gallops,no edema  Respiratory:  clear to auscultation bilaterally, normal work of breathing GI: soft, nontender, nondistended, + BS MS: no deformity or atrophy  Skin: warm and dry, no rash Neuro:  Strength and sensation are intact Psych: euthymic mood, full affect    Recent Labs: 08/30/2016: ALT 36; BUN 11; Creatinine, Ser 0.83; Hemoglobin 14.7; Platelets 289.0; Potassium 4.2; Sodium 138; TSH 1.69    Lipid Panel Lab Results  Component Value Date   CHOL 180 08/30/2016   HDL 62.00 08/30/2016   LDLCALC 96 08/30/2016   TRIG 112.0 08/30/2016      Wt Readings from Last 3 Encounters:  10/12/16 231 lb 4 oz (104.9 kg)  08/30/16 226 lb (102.5 kg)  02/20/16 226 lb 4 oz (102.6 kg)       ASSESSMENT AND PLAN:  Tachycardia - Plan: EKG 12-Lead Suspect sinus tachycardia, unable to exclude atrial tachycardia We have offered Holter monitor or long-term event monitoring She has declined at this time We have provided propranolol for her to take as needed for tachycardia Recommended stress reduction techniques Symptoms typically happen at work, possibly associated with work stress No indication for echocardiogram or stress testing given normal physical exam and EKG.  Anxiety and depression Currently on SSRI  Other fatigue She reports having fatigue after her episode of tachycardia. Typically with arrhythmia, there is no profound fatigue following short runs of tachycardia. Etiology unclear. She is able to exercise heart rate up to 150 even higher, 170 with no significant symptoms. Fast his heart  rate that she has noticed at work as 116 bpm  Patient was seen in consultation for Dr. Deborra Medina and will be referred back to Dr. Marjory Lies for ongoing management of medical issues detailed above     Total encounter time more than 60 minutes  Greater than 50% was spent in counseling and coordination of care with the patient   Disposition:   F/U  as needed   Orders Placed This Encounter  Procedures  . EKG 12-Lead     Signed, Esmond Plants, M.D., Ph.D. 10/12/2016  [facility] [place], [place] [phone]

## 2016-10-12 ENCOUNTER — Encounter: Payer: Self-pay | Admitting: Cardiovascular Disease

## 2016-10-12 ENCOUNTER — Ambulatory Visit (INDEPENDENT_AMBULATORY_CARE_PROVIDER_SITE_OTHER): Payer: 59 | Admitting: Cardiovascular Disease

## 2016-10-12 VITALS — BP 110/72 | HR 80 | Ht 69.0 in | Wt 231.2 lb

## 2016-10-12 DIAGNOSIS — R000 Tachycardia, unspecified: Secondary | ICD-10-CM | POA: Diagnosis not present

## 2016-10-12 DIAGNOSIS — F32A Depression, unspecified: Secondary | ICD-10-CM

## 2016-10-12 DIAGNOSIS — R5383 Other fatigue: Secondary | ICD-10-CM | POA: Diagnosis not present

## 2016-10-12 DIAGNOSIS — F329 Major depressive disorder, single episode, unspecified: Secondary | ICD-10-CM

## 2016-10-12 DIAGNOSIS — F419 Anxiety disorder, unspecified: Secondary | ICD-10-CM | POA: Diagnosis not present

## 2016-10-12 MED ORDER — PROPRANOLOL HCL 10 MG PO TABS
10.0000 mg | ORAL_TABLET | Freq: Three times a day (TID) | ORAL | 1 refills | Status: DC | PRN
Start: 2016-10-12 — End: 2017-10-28

## 2016-10-12 NOTE — Patient Instructions (Signed)
Medication Instructions:   Try propranolol 1 to 2 pills as needed for tachycardia  Labwork:  No new labs needed  Testing/Procedures:  No further testing at this time   I recommend watching educational videos on topics of interest to you at:       [email or web address]  Enter code: [email or web address]    Follow-Up: It was a pleasure seeing you in the office today. Please call us if you have new issues that need to be addressed before your next appt.  [phone]  Your physician wants you to follow-up in:  As needed   If you need a refill on your cardiac medications before your next appointment, please call your pharmacy.

## 2016-10-19 ENCOUNTER — Encounter: Payer: Self-pay | Admitting: Family Medicine

## 2016-10-19 ENCOUNTER — Encounter: Payer: Self-pay | Admitting: Physician Assistant

## 2016-10-19 ENCOUNTER — Ambulatory Visit: Payer: Self-pay | Admitting: Physician Assistant

## 2016-10-19 VITALS — BP 120/80 | HR 87 | Temp 98.2°F

## 2016-10-19 DIAGNOSIS — F418 Other specified anxiety disorders: Secondary | ICD-10-CM

## 2016-10-19 NOTE — Progress Notes (Signed)
   Subjective:    Patient ID: Autumn [name], female    DOB: 1986-05-02, 31 y.o.   MRN: [id number]  HPI Patient c/o of increase anxiety as she gets close to her wedding. Event is in one month.States mild to moderate palpitation. Currently on Lexapro 20 mg daily. Patient saw Cardiologist 2 days ago and was prescribed Propranolol 10 3 times a day. Did not fill prescription. Request 2nd opinion for another anti-axiety medication.   Review of Systems Anxiety.    Objective:   Physical Exam HEENT unremarkable. Neck supple w/o adenopathy/bruits. Lung CTA and Heart RRR.       Assessment & Plan: Situational anxiety.  Advised to continue Lexapro and start Inderal as directed over the weekend. Return in 3 days if no improvement.

## 2016-10-22 ENCOUNTER — Ambulatory Visit: Payer: Self-pay | Admitting: Family Medicine

## 2016-10-23 DIAGNOSIS — Z803 Family history of malignant neoplasm of breast: Secondary | ICD-10-CM | POA: Diagnosis not present

## 2016-10-23 DIAGNOSIS — Z1231 Encounter for screening mammogram for malignant neoplasm of breast: Secondary | ICD-10-CM | POA: Diagnosis not present

## 2016-10-24 ENCOUNTER — Encounter: Payer: Self-pay | Admitting: Family Medicine

## 2016-12-10 ENCOUNTER — Encounter: Payer: Self-pay | Admitting: Cardiovascular Disease

## 2016-12-18 ENCOUNTER — Other Ambulatory Visit: Payer: Self-pay | Admitting: *Deleted

## 2016-12-18 MED ORDER — FUROSEMIDE 20 MG PO TABS: 20.0000 mg | ORAL_TABLET | Freq: Every day | ORAL | 3 refills | Status: DC | PRN

## 2017-02-27 ENCOUNTER — Encounter: Payer: Self-pay | Admitting: Family Medicine

## 2017-06-19 ENCOUNTER — Encounter: Payer: Self-pay | Admitting: Family Medicine

## 2017-06-19 ENCOUNTER — Ambulatory Visit: Payer: 59 | Admitting: Family Medicine

## 2017-06-19 VITALS — BP 118/84 | HR 78 | Temp 98.5°F | Ht 69.0 in | Wt 219.8 lb

## 2017-06-19 DIAGNOSIS — F329 Major depressive disorder, single episode, unspecified: Secondary | ICD-10-CM

## 2017-06-19 DIAGNOSIS — F32A Depression, unspecified: Secondary | ICD-10-CM

## 2017-06-19 DIAGNOSIS — F419 Anxiety disorder, unspecified: Secondary | ICD-10-CM

## 2017-06-19 MED ORDER — BUSPIRONE HCL 15 MG PO TABS: 7.5000 mg | ORAL_TABLET | Freq: Two times a day (BID) | ORAL | 3 refills | Status: DC

## 2017-06-19 NOTE — Progress Notes (Signed)
Subjective:   Patient ID: Autumn [name], female    DOB: July 23, 1985, 32 y.o.   MRN: [id number]  Autumn [name] is a pleasant 32 y.o. year old female who presents to clinic today with Anxiety (Patient is here today to F/U with Anxiety.  She takes Lexapro but the Sx seem to get worse and worse.  Panic attacks almost daily.  Feels that may need to either adjust medication or add something.  )  on 06/19/2017  HPI:   Anxiety- Has been on Lexapro 20 mg daily.  Feels symptoms are worse.  Having almost daily panic attacks. A lot of changes happening- just got married, starting a new job next month.  Just closed on a new house.  All good changes but she feels they are making her anxious.  Denies feeling depressed.  Sleeping well.    Current Outpatient Medications on File Prior to Visit  Medication Sig Dispense Refill  . escitalopram (LEXAPRO) 20 MG tablet Take 1 tablet (20 mg total) by mouth daily. 90 tablet 3  . ibuprofen (ADVIL,MOTRIN) 800 MG tablet TAKE 1 TABLET BY MOUTH 3 TIMES DAILY WITH MEALS 90 tablet 1  . norethindrone-ethinyl estradiol 1/35 (ALAYCEN 1/35) tablet Take 1 tablet by mouth daily. 28 tablet 11  . ondansetron (ZOFRAN) 4 MG tablet Take 1 tablet (4 mg total) by mouth every 8 (eight) hours as needed for nausea or vomiting. 20 tablet 0  . propranolol (INDERAL) 10 MG tablet Take 1 tablet (10 mg total) by mouth 3 (three) times daily as needed. 90 tablet 1  . valACYclovir (VALTREX) 1000 MG tablet Take 1 tablet (1,000 mg total) by mouth 2 (two) times daily. 12 tablet 2   No current facility-administered medications on file prior to visit.     No Known Allergies  Past Medical History:  Diagnosis Date  . Anxiety   . Back pain    chronic  . Depression   . Disc disease, degenerative, lumbar or lumbosacral   . Migraine   . Scoliosis     Past Surgical History:  Procedure Laterality Date  . APPENDECTOMY    . BREAST SURGERY  2008   lump/ benign     Family History  Problem Relation Age of Onset  . Cancer Mother 73       breast  . Alcohol abuse Father   . Vision loss Maternal Grandmother   . Heart disease Maternal Grandmother   . Heart failure Maternal Grandmother   . Cancer Maternal Grandfather   . Alcohol abuse Paternal Grandfather   . Arthritis Maternal Aunt     Social History   Socioeconomic History  . Marital status: Married    Spouse name: Not on file  . Number of children: 1  . Years of education: Not on file  . Highest education level: Not on file  Social Needs  . Financial resource strain: Not on file  . Food insecurity - worry: Not on file  . Food insecurity - inability: Not on file  . Transportation needs - medical: Not on file  . Transportation needs - non-medical: Not on file  Occupational History  . Occupation: CMA  Tobacco Use  . Smoking status: Former Smoker    Types: Cigarettes    Last attempt to quit: 08/18/2016    Years since quitting: 0.8  . Smokeless tobacco: Never Used  . Tobacco comment: 3 cigarettes a day  Substance and Sexual Activity  . Alcohol use: Yes    Alcohol/week:  0.0 oz    Comment: occasional  . Drug use: No  . Sexual activity: No  Other Topics Concern  . Not on file  Social History Narrative  . Not on file   The PMH, PSH, Social History, Family History, Medications, and allergies have been reviewed in [facility], and have been updated if relevant.   Review of Systems  Constitutional: Negative.   Respiratory: Negative.   Cardiovascular: Negative.   Psychiatric/Behavioral: Negative for agitation, behavioral problems, confusion, decreased concentration, dysphoric mood, hallucinations, self-injury, sleep disturbance and suicidal ideas. The patient is nervous/anxious. The patient is not hyperactive.   All other systems reviewed and are negative.      Objective:    BP 118/84 (BP Location: Left Arm, Patient Position: Sitting, Cuff Size: Normal)   Pulse 78   Temp 98.5 F (36.9  C) (Oral)   Ht 5\' 9"  (1.753 m)   Wt 219 lb 12.8 oz (99.7 kg)   LMP 05/21/2017   SpO2 99%   BMI 32.46 kg/m    Physical Exam   General:  Well-developed,well-nourished,in no acute distress; alert,appropriate and cooperative throughout examination Head:  normocephalic and atraumatic.   Eyes:  vision grossly intact, PERRL Ears:  R ear normal and L ear normal externally, TMs clear bilaterally Nose:  no external deformity.   Mouth:  good dentition.   Neck:  No deformities, masses, or tenderness noted. Lungs:  Normal respiratory effort, chest expands symmetrically. Lungs are clear to auscultation, no crackles or wheezes. Heart:  Normal rate and regular rhythm. S1 and S2 normal without gallop, murmur, click, rub or other extra sounds. Msk:  No deformity or scoliosis noted of thoracic or lumbar spine.   Extremities:  No clubbing, cyanosis, edema, or deformity noted with normal full range of motion of all joints.   Neurologic:  alert & oriented X3 and gait normal.   Skin:  Intact without suspicious lesions or rashes Psych:  Cognition and judgment appear intact. Alert and cooperative with normal attention span and concentration. No apparent delusions, illusions, hallucinations       Assessment & Plan:   Anxiety and depression No Follow-up on file.

## 2017-06-19 NOTE — Patient Instructions (Signed)
Great to see you. Happy New Year and congratulations!  We are adding Buspar 7.5 mg twice daily to your lexapro.  Please keep me updated.

## 2017-06-19 NOTE — Assessment & Plan Note (Signed)
>  15 minutes spent in face to face time with patient, >50% spent in counselling or coordination of care. lexapro seems to be controlled depression but anxiety is not well controlled. Add buspar 7.5 mg twice daily to lexapro 20 mg daily. She will update me in a few weeks.

## 2017-07-08 ENCOUNTER — Encounter: Payer: Self-pay | Admitting: Family Medicine

## 2017-07-08 ENCOUNTER — Ambulatory Visit: Payer: 59 | Admitting: Family Medicine

## 2017-07-08 VITALS — BP 122/74 | HR 74 | Temp 98.6°F | Ht 69.0 in | Wt 233.0 lb

## 2017-07-08 DIAGNOSIS — J069 Acute upper respiratory infection, unspecified: Secondary | ICD-10-CM | POA: Diagnosis not present

## 2017-07-08 NOTE — Progress Notes (Signed)
Autumn [name] - 33 y.o. female MRN [id number]  Date of birth: July 10, 1985  SUBJECTIVE:  Including CC & ROS.  Chief Complaint  Patient presents with  . Cough    Autumn [name] is a 32 y.o. female that is presenting with fever and cough. Symptoms started this afternoon. She checked her temperature which was 100.1. She does admit to some chills and body aches. Cough has been ongoing for one month. She feels congested. She has been taking zyrtec with no improvement. Denies any diarrhea or vomiting. Symptoms are intermittent.    Review of Systems  Constitutional: Positive for fever.  HENT: Positive for sinus pressure.   Respiratory: Negative for shortness of breath.   Cardiovascular: Negative for chest pain.  Gastrointestinal: Negative for abdominal distention and abdominal pain.  Musculoskeletal: Negative for back pain.  Skin: Negative for color change.    HISTORY: Past Medical, Surgical, Social, and Family History Reviewed & Updated per EMR.   Pertinent Historical Findings include:  Past Medical History:  Diagnosis Date  . Anxiety   . Back pain    chronic  . Depression   . Disc disease, degenerative, lumbar or lumbosacral   . Migraine   . Scoliosis     Past Surgical History:  Procedure Laterality Date  . APPENDECTOMY    . BREAST SURGERY  2008   lump/ benign    No Known Allergies  Family History  Problem Relation Age of Onset  . Cancer Mother 77       breast  . Alcohol abuse Father   . Vision loss Maternal Grandmother   . Heart disease Maternal Grandmother   . Heart failure Maternal Grandmother   . Cancer Maternal Grandfather   . Alcohol abuse Paternal Grandfather   . Arthritis Maternal Aunt      Social History   Socioeconomic History  . Marital status: Married    Spouse name: Not on file  . Number of children: 1  . Years of education: Not on file  . Highest education level: Not on file  Social Needs  . Financial resource strain:  Not on file  . Food insecurity - worry: Not on file  . Food insecurity - inability: Not on file  . Transportation needs - medical: Not on file  . Transportation needs - non-medical: Not on file  Occupational History  . Occupation: CMA  Tobacco Use  . Smoking status: Former Smoker    Types: Cigarettes    Last attempt to quit: 08/18/2016    Years since quitting: 0.8  . Smokeless tobacco: Never Used  . Tobacco comment: 3 cigarettes a day  Substance and Sexual Activity  . Alcohol use: Yes    Alcohol/week: 0.0 oz    Comment: occasional  . Drug use: No  . Sexual activity: No  Other Topics Concern  . Not on file  Social History Narrative  . Not on file     PHYSICAL EXAM:  VS: BP 122/74 (BP Location: Left Arm, Patient Position: Sitting, Cuff Size: Normal)   Pulse 74   Temp 98.6 F (37 C) (Oral)   Ht 5\' 9"  (1.753 m)   Wt 233 lb (105.7 kg)   SpO2 97%   BMI 34.41 kg/m  Physical Exam Gen: NAD, alert, cooperative with exam, well-appearing ENT: normal lips, normal nasal mucosa, tympanic membranes clear and intact bilaterally, normal oropharynx, no cervical lymphadenopathy Eye: normal EOM, normal conjunctiva and lids CV:  no edema, +2 pedal pulses, regular rate and rhythm,  S1-S2   Resp: no accessory muscle use, non-labored, clear to auscultation bilaterally, no crackles or wheezes Skin: no rashes, no areas of induration  Neuro: normal tone, normal sensation to touch Psych:  normal insight, alert and oriented MSK: Normal gait, normal strength       ASSESSMENT & PLAN:   Upper respiratory tract infection Likely viral in nature.  - counseled on supportive care  - f/u PRN

## 2017-07-08 NOTE — Assessment & Plan Note (Signed)
Likely viral in nature.  - counseled on supportive care  - f/u PRN

## 2017-07-08 NOTE — Patient Instructions (Signed)
Please try things such as zyrtec-D or allegra-D which is an antihistamine and decongestant.   Please try afrin which will help with nasal congestion but use for only three days.   Please also try using a netti pot on a regular occasion.  Honey can help with a sore throat.

## 2017-07-12 ENCOUNTER — Encounter: Payer: Self-pay | Admitting: Family Medicine

## 2017-07-12 ENCOUNTER — Telehealth: Payer: 59 | Admitting: Nurse Practitioner

## 2017-07-12 DIAGNOSIS — J101 Influenza due to other identified influenza virus with other respiratory manifestations: Secondary | ICD-10-CM

## 2017-07-12 MED ORDER — OSELTAMIVIR PHOSPHATE 75 MG PO CAPS: 75.0000 mg | ORAL_CAPSULE | Freq: Two times a day (BID) | ORAL | 0 refills | Status: DC

## 2017-07-12 NOTE — Progress Notes (Signed)

## 2017-07-14 ENCOUNTER — Other Ambulatory Visit: Payer: Self-pay | Admitting: Family Medicine

## 2017-07-14 MED ORDER — VORTIOXETINE HBR 10 MG PO TABS: ORAL_TABLET | ORAL | 1 refills | Status: DC

## 2017-07-17 ENCOUNTER — Telehealth: Payer: Self-pay

## 2017-07-17 NOTE — Telephone Encounter (Signed)
PA for Trintellix 10mg  #30 per 30d initiated via CMM/thx dmf

## 2017-07-22 NOTE — Telephone Encounter (Signed)
PA approved for Trintellix 10mg  from 2.6.19-2.5.2020/thx dmf

## 2017-08-08 ENCOUNTER — Ambulatory Visit: Payer: 59 | Admitting: Internal Medicine

## 2017-08-08 ENCOUNTER — Telehealth: Payer: 59 | Admitting: Physician Assistant

## 2017-08-08 ENCOUNTER — Encounter: Payer: Self-pay | Admitting: Internal Medicine

## 2017-08-08 VITALS — BP 116/74 | HR 111 | Temp 99.4°F | Wt 233.0 lb

## 2017-08-08 DIAGNOSIS — J029 Acute pharyngitis, unspecified: Secondary | ICD-10-CM | POA: Insufficient documentation

## 2017-08-08 LAB — POCT RAPID STREP A (OFFICE): Rapid Strep A Screen: NEGATIVE

## 2017-08-08 NOTE — Progress Notes (Signed)
Subjective:    Patient ID: Autumn [name], female    DOB: [birth year], 32 y.o.   MRN: [id number]  HPI Here due to sore throat  Started yesterday afternoon Then developed significant pain with swallowing or talking Pain on right side of neck Achy all over Pain now up to right ear Has white patch in back of throat also  No clear fever--but felt hot Slight sweat this morning No sig cough--but has chronic drainage (takes zyrtec)  advil this morning--seemed to help  Current Outpatient Medications on File Prior to Visit  Medication Sig Dispense Refill  . ibuprofen (ADVIL,MOTRIN) 800 MG tablet TAKE 1 TABLET BY MOUTH 3 TIMES DAILY WITH MEALS 90 tablet 1  . norethindrone-ethinyl estradiol 1/35 (ALAYCEN 1/35) tablet Take 1 tablet by mouth daily. 28 tablet 11  . ondansetron (ZOFRAN) 4 MG tablet Take 1 tablet (4 mg total) by mouth every 8 (eight) hours as needed for nausea or vomiting. 20 tablet 0  . propranolol (INDERAL) 10 MG tablet Take 1 tablet (10 mg total) by mouth 3 (three) times daily as needed. 90 tablet 1  . valACYclovir (VALTREX) 1000 MG tablet Take 1 tablet (1,000 mg total) by mouth 2 (two) times daily. 12 tablet 2  . vortioxetine HBr (TRINTELLIX) 10 MG TABS 1 tab by mouth daily 30 tablet 1   No current facility-administered medications on file prior to visit.     No Known Allergies  Past Medical History:  Diagnosis Date  . Anxiety   . Back pain    chronic  . Depression   . Disc disease, degenerative, lumbar or lumbosacral   . Migraine   . Scoliosis     Past Surgical History:  Procedure Laterality Date  . APPENDECTOMY    . BREAST SURGERY  2008   lump/ benign    Family History  Problem Relation Age of Onset  . Cancer Mother 73       breast  . Alcohol abuse Father   . Vision loss Maternal Grandmother   . Heart disease Maternal Grandmother   . Heart failure Maternal Grandmother   . Cancer Maternal Grandfather   . Alcohol abuse Paternal Grandfather    . Arthritis Maternal Aunt     Social History   Socioeconomic History  . Marital status: Married    Spouse name: Not on file  . Number of children: 1  . Years of education: Not on file  . Highest education level: Not on file  Social Needs  . Financial resource strain: Not on file  . Food insecurity - worry: Not on file  . Food insecurity - inability: Not on file  . Transportation needs - medical: Not on file  . Transportation needs - non-medical: Not on file  Occupational History  . Occupation: CMA  Tobacco Use  . Smoking status: Former Smoker    Types: Cigarettes    Last attempt to quit: 08/18/2016    Years since quitting: 0.9  . Smokeless tobacco: Never Used  . Tobacco comment: 3 cigarettes a day  Substance and Sexual Activity  . Alcohol use: Yes    Alcohol/week: 0.0 oz    Comment: occasional  . Drug use: No  . Sexual activity: No  Other Topics Concern  . Not on file  Social History Narrative  . Not on file   Review of Systems  No rash No vomiting or diarrhea Slight abdominal discomfort today---appetite is off 22 year old son--sick 2 weeks ago Works in our  medical office     Objective:   Physical Exam  Constitutional: She appears well-developed. No distress.  HENT:  No sinus tenderness Tonsils 1+ with slight injection. Small white spot on right TMs normal Mild nasal inflammation  Neck: Normal range of motion.  ?slight non tender submental nodes  Pulmonary/Chest: Effort normal and breath sounds normal. No respiratory distress. She has no wheezes. She has no rales.  Skin: No rash noted.          Assessment & Plan:

## 2017-08-08 NOTE — Addendum Note (Signed)
Addended by: Pilar Grammes on: 08/08/2017 03:25 PM   Modules accepted: Orders

## 2017-08-08 NOTE — Progress Notes (Signed)
Based on what you shared with me it seems that you are suffering from a pharyngitis. Pharyngitis, or an inflammation or infection of the throat, can be viral or bacterial. In most cases it is viral and will run its course within 7 to 10 days, with symptoms most bothersome on the first few days. Strep throat, or bacterial pharyngitis, typically presents with a fever, aches, swollen glands and sore throat with the absence of other upper respiratory symptoms. We cannot diagnose or treat strep throat via e-visit. This requires an office visit and throat swab to diagnose. Keep well hydrated and get plenty of rest. Use Tylenol if needed for throat pain. Salt water gargles and Chloraseptic spray may also be effective. Giving the mention of the white patch on your throat, I would recommend you be seen in person so that you can get a test for strep throat. Please do not delay care.  NOTE: If you entered your credit card information for this eVisit, you will not be charged. You may see a "hold" on your card for the $30 but that hold will drop off and you will not have a charge processed.  If you are having a true medical emergency please call 911.  If you need an urgent face to face visit, [facility] has four urgent care centers for your convenience.  If you need care fast and have a high deductible or no insurance consider:   [email or web address] to reserve your spot online an avoid wait times  [facility] [address] [place], [place] [address] 8 am to 8 pm Monday-Friday 10 am to 4 pm Saturday-Sunday *Across the street from [employer]  [place], [id number] 8 am to 5 pm Monday-Friday * In the [facility] on the [facility]   The following sites will take your  insurance:  . [facility]gent [place] a Provider at this Location  [address] [place], [place]  [address] . 10 am to 8 pm Monday-Friday . 12 pm to 8 pm Saturday-Sunday   . [facility] at [place] a Provider at this Location  [address] [place] [address], [place], [place] [address] . 8 am to 8 pm Monday-Friday . 9 am to 6 pm Saturday . 11 am to 6 pm Sunday   . [facility] at [place] Get Driving Directions  [address] [place], [place] [address] . 8 am to 8 pm Monday-Friday . 8 am to 4 pm Saturday-Sunday   Your e-visit answers were reviewed by a board certified advanced clinical practitioner to complete your personal care plan.  Thank you for using e-Visits.

## 2017-08-08 NOTE — Assessment & Plan Note (Signed)
Throat not striking Rapid strep negative and no exposure Discussed supportive care

## 2017-08-12 ENCOUNTER — Encounter: Payer: Self-pay | Admitting: Internal Medicine

## 2017-08-12 ENCOUNTER — Ambulatory Visit: Payer: 59 | Admitting: Internal Medicine

## 2017-08-12 VITALS — BP 116/80 | HR 84 | Temp 98.7°F | Wt 232.0 lb

## 2017-08-12 DIAGNOSIS — H6693 Otitis media, unspecified, bilateral: Secondary | ICD-10-CM | POA: Diagnosis not present

## 2017-08-12 MED ORDER — FLUTICASONE PROPIONATE 50 MCG/ACT NA SUSP: 2.0000 | Freq: Every day | NASAL | 6 refills | Status: DC

## 2017-08-12 MED ORDER — AMOXICILLIN 875 MG PO TABS: 875.0000 mg | ORAL_TABLET | Freq: Two times a day (BID) | ORAL | 0 refills | Status: DC

## 2017-08-12 NOTE — Patient Instructions (Signed)
Otitis Media, Adult Otitis media is redness, soreness, and puffiness (swelling) in the space just behind your eardrum (middle ear). It may be caused by allergies or infection. It often happens along with a cold. Follow these instructions at home:  Take your medicine as told. Finish it even if you start to feel better.  Only take over-the-counter or prescription medicines for pain, discomfort, or fever as told by your doctor.  Follow up with your doctor as told. Contact a doctor if:  You have otitis media only in one ear, or bleeding from your nose, or both.  You notice a lump on your neck.  You are not getting better in 3-5 days.  You feel worse instead of better. Get help right away if:  You have pain that is not helped with medicine.  You have puffiness, redness, or pain around your ear.  You get a stiff neck.  You cannot move part of your face (paralysis).  You notice that the bone behind your ear hurts when you touch it. This information is not intended to replace advice given to you by your health care provider. Make sure you discuss any questions you have with your health care provider. Document Released: 11/14/2007 Document Revised: 11/03/2015 Document Reviewed: 12/23/2012 Elsevier Interactive Patient Education  2017 Elsevier Inc.  

## 2017-08-12 NOTE — Progress Notes (Signed)
HPI  Pt presents to the clinic today with c/o ear pain and cough. She reports this started 2-3 days ago. She describes the ear pain as fullness. She has noticed some associated ringing in her ears. She denies decreased hearing. The cough is productive of thick mucous. She denies runny nose, nasal congestion, sore throat, fever, chills or body aches. She has tried Zyrtec, Tylelnol, Ibuprofen, Mucinex D and Flonase with minimal relief. She has no history of seasonal allergies. She has had sick contacts. She was seen 2/28 with viral pharyngitis, RST was negative. She was seen 1/28 for a viral URI, no abx at that time.  Review of Systems      Past Medical History:  Diagnosis Date  . Anxiety   . Back pain    chronic  . Depression   . Disc disease, degenerative, lumbar or lumbosacral   . Migraine   . Scoliosis     Family History  Problem Relation Age of Onset  . Cancer Mother 65       breast  . Alcohol abuse Father   . Vision loss Maternal Grandmother   . Heart disease Maternal Grandmother   . Heart failure Maternal Grandmother   . Cancer Maternal Grandfather   . Alcohol abuse Paternal Grandfather   . Arthritis Maternal Aunt     Social History   Socioeconomic History  . Marital status: Married    Spouse name: Not on file  . Number of children: 1  . Years of education: Not on file  . Highest education level: Not on file  Social Needs  . Financial resource strain: Not on file  . Food insecurity - worry: Not on file  . Food insecurity - inability: Not on file  . Transportation needs - medical: Not on file  . Transportation needs - non-medical: Not on file  Occupational History  . Occupation: CMA  Tobacco Use  . Smoking status: Former Smoker    Types: Cigarettes    Last attempt to quit: 08/18/2016    Years since quitting: 0.9  . Smokeless tobacco: Never Used  . Tobacco comment: 3 cigarettes a day  Substance and Sexual Activity  . Alcohol use: Yes    Alcohol/week: 0.0 oz     Comment: occasional  . Drug use: No  . Sexual activity: No  Other Topics Concern  . Not on file  Social History Narrative  . Not on file    No Known Allergies   Constitutional: Denies headache, fatigue, fever or abrupt weight changes.  HEENT:  Denies eye redness, eye pain, pressure behind the eyes, facial pain, nasal congestion, ear pain, ringing in the ears, wax buildup, runny nose or sore throat. Respiratory: Positive cough. Denies difficulty breathing or shortness of breath.  Cardiovascular: Denies chest pain, chest tightness, palpitations or swelling in the hands or feet.   No other specific complaints in a complete review of systems (except as listed in HPI above).  Objective:   BP 116/80   Pulse 84   Temp 98.7 F (37.1 C) (Oral)   Wt 232 lb (105.2 kg)   LMP 07/25/2017   SpO2 98%   BMI 34.26 kg/m   Wt Readings from Last 3 Encounters:  08/12/17 232 lb (105.2 kg)  08/08/17 233 lb (105.7 kg)  07/08/17 233 lb (105.7 kg)     General: Appears her stated age, in NAD. HEENT: Head: normal shape and size, no sinus tenderness noted;  Ears: Tm's red but intact, distorted light reflex;  Throat/Mouth: Teeth present, mucosa erythematous and moist, no exudate noted but tonsillar stone noted on right tonsillar pillar, no lesions or ulcerations noted.  Neck: No cervical lymphadenopathy.   Pulmonary/Chest: Normal effort and positive vesicular breath sounds. No respiratory distress. No wheezes, rales or ronchi noted.       Assessment & Plan:  Bilateral Otitis Media:  Get some rest and drink plenty of water eRx for Amoxil BID x 10 days Delsym as needed for cough  RTC as needed or if symptoms persist.   Webb Silversmith, NP

## 2017-08-15 ENCOUNTER — Encounter: Payer: Self-pay | Admitting: Internal Medicine

## 2017-08-17 DIAGNOSIS — H5213 Myopia, bilateral: Secondary | ICD-10-CM | POA: Diagnosis not present

## 2017-08-19 ENCOUNTER — Encounter: Payer: Self-pay | Admitting: Internal Medicine

## 2017-08-19 ENCOUNTER — Other Ambulatory Visit: Payer: Self-pay | Admitting: Internal Medicine

## 2017-08-19 MED ORDER — PREDNISONE 10 MG PO TABS: ORAL_TABLET | ORAL | 0 refills | Status: DC

## 2017-09-02 ENCOUNTER — Ambulatory Visit (INDEPENDENT_AMBULATORY_CARE_PROVIDER_SITE_OTHER): Payer: 59 | Admitting: Family Medicine

## 2017-09-02 ENCOUNTER — Encounter: Payer: Self-pay | Admitting: Family Medicine

## 2017-09-02 VITALS — BP 130/86 | HR 84 | Temp 98.5°F | Ht 70.0 in | Wt 235.0 lb

## 2017-09-02 DIAGNOSIS — R0789 Other chest pain: Secondary | ICD-10-CM

## 2017-09-02 DIAGNOSIS — F419 Anxiety disorder, unspecified: Secondary | ICD-10-CM

## 2017-09-02 DIAGNOSIS — R1013 Epigastric pain: Secondary | ICD-10-CM | POA: Insufficient documentation

## 2017-09-02 DIAGNOSIS — R079 Chest pain, unspecified: Secondary | ICD-10-CM

## 2017-09-02 DIAGNOSIS — F32A Depression, unspecified: Secondary | ICD-10-CM

## 2017-09-02 DIAGNOSIS — F329 Major depressive disorder, single episode, unspecified: Secondary | ICD-10-CM | POA: Diagnosis not present

## 2017-09-02 MED ORDER — RANITIDINE HCL 150 MG PO CAPS: 150.0000 mg | ORAL_CAPSULE | Freq: Two times a day (BID) | ORAL | 1 refills | Status: DC

## 2017-09-02 NOTE — Assessment & Plan Note (Signed)
Worse anxiety since changing to Trintellix-however was also on prednisone recently  Pt wants to give it more time  Will f/u with PCP

## 2017-09-02 NOTE — Assessment & Plan Note (Signed)
2 kinds of chest discomfort- both atypical/non exertional in pt with anx/dep/recent prednisone that sound GI in nature Reassuring exam/EKG /vitals Trial of ranitidine 150 mg bid Alert if no imp  Consider [place] to look at gallbladder if needed Consider CXR if needed

## 2017-09-02 NOTE — Progress Notes (Signed)
Subjective:    Patient ID: Autumn [name], female    DOB: [birth year], 32 y.o.   MRN: [id number]  HPI Here for symptom of chest pain   32 yo pt of Dr Deborra Medina- w/o hx of cardiac issues She is treated for anxiety and depression (newly on Trintellix)-in the past mo  Not working as well for mood but it may not be enough time    She takes propranolol prn (for tachycardia/palpitations off and on) Dr Rockey Situ -sees her for this She does not use often Has not needed much lately   Was recently on prednisone from [facility] Readings from Last 3 Encounters:  09/02/17 235 lb (106.6 kg)  08/12/17 232 lb (105.2 kg)  08/08/17 233 lb (105.7 kg)   33.72 kg/m   BP Readings from Last 3 Encounters:  09/02/17 130/86  08/12/17 116/80  08/08/17 116/74   Pulse Readings from Last 3 Encounters:  09/02/17 84  08/12/17 84  08/08/17 (!) 111   EKG today NSR with neg precordial T waves (nl for age)  Rate of 70 No acute changes   Hx - she developed pain in her R side of chest- rad thru to the back (at rest)  Thought it was a spasm  She pressed on it- that helped a bit  Lasted - 5-10 minutes at most / went away and then she had a tingly/burning sensation that also went away  In the past 10 days has had some discomfort after eating (in esophagus area) - lasting for about an hour and tums helps  Diet is up and down in terms of quality  Now feels fine   She still has a gallbladder   occ wakes up with some nausea  She is on birth control  LMP 3/7  Patient Active Problem List   Diagnosis Date Noted  . Chest pain 09/02/2017  . Epigastric discomfort 09/02/2017  . Contraception management 08/30/2016  . Family history of breast cancer in mother 08/30/2016  . Obesity (BMI 30.0-34.9) 08/19/2014  . Amenorrhea 12/25/2013  . Anxiety and depression 07/15/2012  . Screening for STD (sexually transmitted disease) 01/10/2012   Past Medical History:  Diagnosis Date  . Anxiety   . Back pain    chronic  . Depression   . Disc disease, degenerative, lumbar or lumbosacral   . Migraine   . Scoliosis    Past Surgical History:  Procedure Laterality Date  . APPENDECTOMY    . BREAST SURGERY  2008   lump/ benign   Social History   Tobacco Use  . Smoking status: Former Smoker    Types: Cigarettes    Last attempt to quit: 08/18/2016    Years since quitting: 1.0  . Smokeless tobacco: Never Used  . Tobacco comment: 3 cigarettes a day  Substance Use Topics  . Alcohol use: Yes    Alcohol/week: 0.0 oz    Comment: occasional  . Drug use: No   Family History  Problem Relation Age of Onset  . Cancer Mother 74       breast  . Alcohol abuse Father   . Vision loss Maternal Grandmother   . Heart disease Maternal Grandmother   . Heart failure Maternal Grandmother   . Cancer Maternal Grandfather   . Alcohol abuse Paternal Grandfather   . Arthritis Maternal Aunt    No Known Allergies Current Outpatient Medications on File Prior to Visit  Medication Sig Dispense Refill  . cetirizine (ZYRTEC) 10 MG tablet  Take 10 mg by mouth daily.    . fluticasone (FLONASE) 50 MCG/ACT nasal spray Place 2 sprays into both nostrils daily. 16 g 6  . ibuprofen (ADVIL,MOTRIN) 800 MG tablet TAKE 1 TABLET BY MOUTH 3 TIMES DAILY WITH MEALS 90 tablet 1  . norethindrone-ethinyl estradiol 1/35 (ALAYCEN 1/35) tablet Take 1 tablet by mouth daily. 28 tablet 11  . ondansetron (ZOFRAN) 4 MG tablet Take 1 tablet (4 mg total) by mouth every 8 (eight) hours as needed for nausea or vomiting. 20 tablet 0  . vortioxetine HBr (TRINTELLIX) 10 MG TABS 1 tab by mouth daily 30 tablet 1  . propranolol (INDERAL) 10 MG tablet Take 1 tablet (10 mg total) by mouth 3 (three) times daily as needed. (Patient not taking: Reported on 09/02/2017) 90 tablet 1  . valACYclovir (VALTREX) 1000 MG tablet Take 1 tablet (1,000 mg total) by mouth 2 (two) times daily. (Patient not taking: Reported on 09/02/2017) 12 tablet 2   No current  facility-administered medications on file prior to visit.      Review of Systems  Constitutional: Negative for activity change, appetite change, fatigue, fever and unexpected weight change.  HENT: Negative for congestion, ear pain, rhinorrhea, sinus pressure and sore throat.   Eyes: Negative for pain, redness and visual disturbance.  Respiratory: Negative for cough, shortness of breath and wheezing.   Cardiovascular: Positive for chest pain. Negative for palpitations and leg swelling.  Gastrointestinal: Positive for abdominal pain and nausea. Negative for blood in stool, constipation, diarrhea, rectal pain and vomiting.  Endocrine: Negative for polydipsia and polyuria.  Genitourinary: Negative for dysuria, frequency and urgency.  Musculoskeletal: Negative for arthralgias, back pain and myalgias.  Skin: Negative for pallor and rash.  Allergic/Immunologic: Negative for environmental allergies.  Neurological: Negative for dizziness, syncope and headaches.  Hematological: Negative for adenopathy. Does not bruise/bleed easily.  Psychiatric/Behavioral: Negative for decreased concentration and dysphoric mood. The patient is nervous/anxious.        Objective:   Physical Exam  Constitutional: She appears well-developed and well-nourished. No distress.  obese and well appearing   HENT:  Head: Normocephalic and atraumatic.  Mouth/Throat: Oropharynx is clear and moist.  Eyes: Pupils are equal, round, and reactive to light. Conjunctivae and EOM are normal.  Neck: Normal range of motion. Neck supple. No JVD present. Carotid bruit is not present. No thyromegaly present.  Cardiovascular: Normal rate, regular rhythm, normal heart sounds and intact distal pulses. Exam reveals no gallop.  Pulmonary/Chest: Effort normal and breath sounds normal. No respiratory distress. She has no wheezes. She has no rales. She exhibits no tenderness.  No crackles  No cw tenderness  Abdominal: Soft. Bowel sounds are  normal. She exhibits no distension, no pulsatile liver, no abdominal bruit, no pulsatile midline mass and no mass. There is no hepatosplenomegaly. There is tenderness in the epigastric area. There is no rebound, no guarding, no CVA tenderness, no tenderness at McBurney's point and negative Murphy's sign.  Musculoskeletal: She exhibits no edema or tenderness.  No LE edema or tenderness Neg Homans' sign  Lymphadenopathy:    She has no cervical adenopathy.  Neurological: She is alert. She has normal reflexes. No cranial nerve deficit. She exhibits normal muscle tone. Coordination normal.  Skin: Skin is warm and dry. No rash noted. No pallor.  Psychiatric: She has a normal mood and affect.  Pleasant and talkative           Assessment & Plan:   Problem List Items Addressed This Visit  Other   Anxiety and depression    Worse anxiety since changing to Trintellix-however was also on prednisone recently  Pt wants to give it more time  Will f/u with PCP      Chest pain - Primary    2 kinds of chest discomfort- both atypical/non exertional in pt with anx/dep/recent prednisone that sound GI in nature Reassuring exam/EKG /vitals Trial of ranitidine 150 mg bid Alert if no imp  Consider [place] to look at gallbladder if needed Consider CXR if needed        Relevant Orders   EKG 12-Lead (Completed)   Epigastric discomfort    With some c/o of cp  ? If gastritis or gerd Some tenderness  Trial of ranitidine 150 bid-alert if no imp  Foods to avoid rev  Wt loss enc  Reassuring exam  Avoid nsaids  Image gallbladder if no imp -consider

## 2017-09-02 NOTE — Patient Instructions (Signed)
I think that the recent prednisone may be worsening anxiety and stomach issues (possibly causing the chest pain) Exam is re assuring today - and vitals and EKG  If symptoms suddenly worsen- get to ED and alert [place]   Start ranitidine (zantac) 150 mg twice daily - 1-2 months If not improving or symptoms continue please let us know   Make sure to get an appt with Dr Deborra Medina

## 2017-09-02 NOTE — Assessment & Plan Note (Signed)
With some c/o of cp  ? If gastritis or gerd Some tenderness  Trial of ranitidine 150 bid-alert if no imp  Foods to avoid rev  Wt loss enc  Reassuring exam  Avoid nsaids  Image gallbladder if no imp -consider

## 2017-09-09 ENCOUNTER — Other Ambulatory Visit: Payer: Self-pay | Admitting: Family Medicine

## 2017-09-09 ENCOUNTER — Encounter: Payer: Self-pay | Admitting: Family Medicine

## 2017-09-09 NOTE — Telephone Encounter (Signed)
Awaiting response from Dr. Deborra Medina about switching back to this from Trintellix/thx dmf

## 2017-09-19 DIAGNOSIS — R42 Dizziness and giddiness: Secondary | ICD-10-CM | POA: Diagnosis not present

## 2017-09-19 DIAGNOSIS — H9319 Tinnitus, unspecified ear: Secondary | ICD-10-CM | POA: Diagnosis not present

## 2017-09-19 DIAGNOSIS — J340 Abscess, furuncle and carbuncle of nose: Secondary | ICD-10-CM | POA: Diagnosis not present

## 2017-09-19 DIAGNOSIS — H93299 Other abnormal auditory perceptions, unspecified ear: Secondary | ICD-10-CM | POA: Diagnosis not present

## 2017-09-23 ENCOUNTER — Telehealth: Payer: Self-pay

## 2017-09-23 ENCOUNTER — Other Ambulatory Visit: Payer: Self-pay

## 2017-09-23 NOTE — Telephone Encounter (Signed)
Yes okay to start pt on HCTZ 12.5 mg daily.  Please make sure her blood pressure is followed up.

## 2017-09-23 NOTE — Telephone Encounter (Signed)
Per TA ok to start HCTZ 12.5mg  1qd for Atypical Meniere's but needs to have BP followed-up with/I faxed a coversheet with original req from their office stating this/thx dmf

## 2017-09-23 NOTE — Telephone Encounter (Signed)
TA-Faxed request from Dr. Pryor Ochoa at [place] stating "Wants to start pt on HCTZ 12.5mg  for possible atypical Meniere's.  Please call or fax [place] authorization to start pt on medication."/Plz advise/thx dmf

## 2017-10-28 ENCOUNTER — Ambulatory Visit (HOSPITAL_COMMUNITY)
Admission: RE | Admit: 2017-10-28 | Discharge: 2017-10-28 | Disposition: A | Discharge status: Home / Self Care | Payer: 59 | Source: Ambulatory Visit | Attending: Family Medicine | Admitting: Family Medicine

## 2017-10-28 ENCOUNTER — Telehealth: Payer: Self-pay | Admitting: Family Medicine

## 2017-10-28 ENCOUNTER — Ambulatory Visit: Payer: 59 | Admitting: Family Medicine

## 2017-10-28 ENCOUNTER — Telehealth: Payer: Self-pay

## 2017-10-28 ENCOUNTER — Encounter: Payer: Self-pay | Admitting: Family Medicine

## 2017-10-28 ENCOUNTER — Other Ambulatory Visit: Payer: Self-pay | Admitting: Family Medicine

## 2017-10-28 DIAGNOSIS — R1031 Right lower quadrant pain: Secondary | ICD-10-CM

## 2017-10-28 DIAGNOSIS — N841 Polyp of cervix uteri: Secondary | ICD-10-CM

## 2017-10-28 DIAGNOSIS — N889 Noninflammatory disorder of cervix uteri, unspecified: Secondary | ICD-10-CM | POA: Diagnosis not present

## 2017-10-28 DIAGNOSIS — R1030 Lower abdominal pain, unspecified: Secondary | ICD-10-CM | POA: Diagnosis not present

## 2017-10-28 LAB — COMPREHENSIVE METABOLIC PANEL
ALK PHOS: 52 U/L (ref 39–117)
ALT: 25 U/L (ref 0–35)
AST: 15 U/L (ref 0–37)
Albumin: 4.1 g/dL (ref 3.5–5.2)
BILIRUBIN TOTAL: 0.4 mg/dL (ref 0.2–1.2)
BUN: 13 mg/dL (ref 6–23)
CALCIUM: 9.1 mg/dL (ref 8.4–10.5)
CO2: 24 mEq/L (ref 19–32)
CREATININE: 0.78 mg/dL (ref 0.40–1.20)
Chloride: 104 mEq/L (ref 96–112)
GFR: 91.18 mL/min (ref >60.00)
Glucose, Bld: 101 mg/dL — ABNORMAL HIGH (ref 70–99)
Potassium: 3.8 mEq/L (ref 3.5–5.1)
SODIUM: 138 meq/L (ref 135–145)
TOTAL PROTEIN: 7.5 g/dL (ref 6.0–8.3)

## 2017-10-28 LAB — POCT URINALYSIS DIPSTICK
BILIRUBIN UA: NEGATIVE
Blood, UA: NEGATIVE
Glucose, UA: NEGATIVE
KETONES UA: NEGATIVE
Leukocytes, UA: NEGATIVE
NITRITE UA: NEGATIVE
Protein, UA: NEGATIVE
SPEC GRAV UA: 1.015 (ref 1.010–1.025)
Urobilinogen, UA: 0.2 E.U./dL
pH, UA: 6 (ref 5.0–8.0)

## 2017-10-28 LAB — CBC
HEMATOCRIT: 41.7 % (ref 36.0–46.0)
Hemoglobin: 14.2 g/dL (ref 12.0–15.0)
MCHC: 34.1 g/dL (ref 30.0–36.0)
MCV: 89.2 fl (ref 78.0–100.0)
Platelets: 290 10*3/uL (ref 150.0–400.0)
RBC: 4.67 Mil/uL (ref 3.87–5.11)
RDW: 12.6 % (ref 11.5–15.5)
WBC: 6.7 10*3/uL (ref 4.0–10.5)

## 2017-10-28 LAB — HCG, QUANTITATIVE, PREGNANCY: Quantitative HCG: 0.34 m[IU]/mL

## 2017-10-28 LAB — POCT URINE PREGNANCY: Preg Test, Ur: NEGATIVE

## 2017-10-28 NOTE — Addendum Note (Signed)
Addended by: Lucille Passy on: 10/28/2017 02:28 PM   Modules accepted: Orders

## 2017-10-28 NOTE — Patient Instructions (Signed)
Great to see you. Please stop by up front to schedule your ultrasound.

## 2017-10-28 NOTE — Telephone Encounter (Signed)
TA-Call report from [facility] states the following:  54mm endocervical lesion with a feeding vessel most suggestive of an endocervical polyp/direct visualization is suggested/otherwise negative pelvic U/A/plz advise/thx dmf

## 2017-10-28 NOTE — Telephone Encounter (Signed)
Please call pt to let her know of these findings- reassuring but would explain her symptoms and we do need to refer her to GYN.  Referral placed.

## 2017-10-28 NOTE — Telephone Encounter (Signed)
Copied from [place] [id number]. Topic: Quick Communication - See Telephone Encounter >> Oct 28, 2017  4:18 PM Autumn [name] wrote: CRM for notification. See Telephone encounter for: 10/28/17.  Patient said she was in the office today around 11 and had a stat ultra sound at 1pm. She is wanting to know if the results are back because she is worried.

## 2017-10-28 NOTE — Addendum Note (Signed)
Addended by: Marrion Coy on: 10/28/2017 11:48 AM   Modules accepted: Orders

## 2017-10-28 NOTE — Telephone Encounter (Signed)
I tried to call pt and someone answered and said hold on let me get Autumn [name]/I sat for 5 minutes with no one coming to the phone so I hung up/will try again tomorrow/PEC ok to give pt information that we are referring to GYN that results are reassuring and explain reason she is having these symptoms-there is a lesion that is suggestive of an endocervical polyp and that direct visualization is suggested which is why we need to get her to a GYN but otherwise negative findings/she can expect a call to schedule her visit/thx dmf

## 2017-10-28 NOTE — Assessment & Plan Note (Signed)
New- does seem progressive. U preg neg. ? Ectopic pregnancy vs ovarian cyst, also cannot rule out subacutely presenting appendicitis. Stat labs and ultrasound ordered today. The patient indicates understanding of these issues and agrees with the plan. Orders Placed This Encounter  Procedures  . US Transvaginal Non-OB  . US Abdomen Complete  . B-HCG Quant  . Comprehensive metabolic panel  . CBC  . POCT urinalysis dipstick  . POCT urine pregnancy

## 2017-10-28 NOTE — Telephone Encounter (Signed)
Pt is aware/thx dmf 

## 2017-10-28 NOTE — Progress Notes (Signed)
Subjective:   Patient ID: Autumn [name], female    DOB: 09/03/1985, 32 y.o.   MRN: [id number]  Autumn [name] is a pleasant 32 y.o. year old female who presents to clinic today with Abdominal Pain (Patient is here today C/O right iliac pain that started on 5.18.19 and is more intense today.  Denies any urinary Sx.  Feels like it is more related to right ovary.  The pain is a grabbing sensation and it takes a lot to be able to concentrate at work.  Feels like it did when she had a past cyst.)  on 10/28/2017  HPI:  Right suprapubic pain- Started two days ago, more intense today.  No urinary symptoms.  Feels like pain she had when she had a right ovarian cyst.  No dysuria.  She is on OCP and sexually active with her husband.  She has forgotten pills and her period was 2 weeks ago but only lasted a day.  Pain is getting progressively worse.  Worse when she walks or presses down on it.  Current Outpatient Medications on File Prior to Visit  Medication Sig Dispense Refill  . cetirizine (ZYRTEC) 10 MG tablet Take 10 mg by mouth daily.    Marland Kitchen escitalopram (LEXAPRO) 20 MG tablet TAKE 1 TABLET BY MOUTH DAILY. 90 tablet 3  . ibuprofen (ADVIL,MOTRIN) 800 MG tablet TAKE 1 TABLET BY MOUTH 3 TIMES DAILY WITH MEALS 90 tablet 1  . norethindrone-ethinyl estradiol 1/35 (ALAYCEN 1/35) tablet Take 1 tablet by mouth daily. 28 tablet 11  . valACYclovir (VALTREX) 1000 MG tablet Take 1 tablet (1,000 mg total) by mouth 2 (two) times daily. 12 tablet 2   No current facility-administered medications on file prior to visit.     No Known Allergies  Past Medical History:  Diagnosis Date  . Anxiety   . Back pain    chronic  . Depression   . Disc disease, degenerative, lumbar or lumbosacral   . Migraine   . Scoliosis     Past Surgical History:  Procedure Laterality Date  . APPENDECTOMY    . BREAST SURGERY  2008   lump/ benign    Family History  Problem Relation Age of Onset  . Cancer  Mother 70       breast  . Alcohol abuse Father   . Vision loss Maternal Grandmother   . Heart disease Maternal Grandmother   . Heart failure Maternal Grandmother   . Cancer Maternal Grandfather   . Alcohol abuse Paternal Grandfather   . Arthritis Maternal Aunt     Social History   Socioeconomic History  . Marital status: Married    Spouse name: Not on file  . Number of children: 1  . Years of education: Not on file  . Highest education level: Not on file  Occupational History  . Occupation: CMA  Social Needs  . Financial resource strain: Not on file  . Food insecurity:    Worry: Not on file    Inability: Not on file  . Transportation needs:    Medical: Not on file    Non-medical: Not on file  Tobacco Use  . Smoking status: Former Smoker    Types: Cigarettes    Last attempt to quit: 08/18/2016    Years since quitting: 1.1  . Smokeless tobacco: Never Used  . Tobacco comment: 3 cigarettes a day  Substance and Sexual Activity  . Alcohol use: Yes    Alcohol/week: 0.0 oz  Comment: occasional  . Drug use: No  . Sexual activity: Never  Lifestyle  . Physical activity:    Days per week: Not on file    Minutes per session: Not on file  . Stress: Not on file  Relationships  . Social connections:    Talks on phone: Not on file    Gets together: Not on file    Attends religious service: Not on file    Active member of club or organization: Not on file    Attends meetings of clubs or organizations: Not on file    Relationship status: Not on file  . Intimate partner violence:    Fear of current or ex partner: Not on file    Emotionally abused: Not on file    Physically abused: Not on file    Forced sexual activity: Not on file  Other Topics Concern  . Not on file  Social History Narrative  . Not on file   The PMH, PSH, Social History, Family History, Medications, and allergies have been reviewed in [facility], and have been updated if relevant.   Review of Systems    Constitutional: Negative.   Gastrointestinal: Positive for abdominal pain. Negative for abdominal distention, anal bleeding, blood in stool, constipation, diarrhea, nausea, rectal pain and vomiting.  Genitourinary: Negative.   Skin: Negative.   Neurological: Negative.   All other systems reviewed and are negative.      Objective:    BP 112/78 (BP Location: Left Arm, Patient Position: Sitting, Cuff Size: Normal)   Pulse 85   Temp 99 F (37.2 C) (Oral)   Ht 5\' 10"  (1.778 m)   Wt 236 lb 3.2 oz (107.1 kg)   LMP 10/16/2017 (Exact Date)   SpO2 98%   BMI 33.89 kg/m    Physical Exam    General:  Well-developed,well-nourished,in no acute distress; alert,appropriate and cooperative throughout examination Head:  normocephalic and atraumatic.   Eyes:  vision grossly intact, PERRL Ears:  R ear normal and L ear normal externally, TMs clear bilaterally Nose:  no external deformity.   Mouth:  good dentition.   Neck:  No deformities, masses, or tenderness noted. Lungs:  Normal respiratory effort, chest expands symmetrically. Lungs are clear to auscultation, no crackles or wheezes. Heart:  Normal rate and regular rhythm. S1 and S2 normal without gallop, murmur, click, rub or other extra sounds. Abdomen:  +RLQ TTP, no rebound, no guarding Neurologic:  alert & oriented X3 and gait normal.   Skin:  Intact without suspicious lesions or rashes Psych:  Cognition and judgment appear intact. Alert and cooperative with normal attention span and concentration. No apparent delusions, illusions, hallucinations      Assessment & Plan:   Right lower quadrant abdominal pain - Plan: POCT urinalysis dipstick, POCT urine pregnancy, US Transvaginal Non-OB, B-HCG Quant, Comprehensive metabolic panel, CBC, US Abdomen Complete No follow-ups on file.

## 2017-10-30 ENCOUNTER — Encounter: Payer: Self-pay | Admitting: Obstetrics and Gynecology

## 2017-10-30 ENCOUNTER — Ambulatory Visit: Payer: 59 | Admitting: Obstetrics and Gynecology

## 2017-10-30 VITALS — BP 111/77 | HR 80 | Ht 70.0 in | Wt 234.1 lb

## 2017-10-30 DIAGNOSIS — N840 Polyp of corpus uteri: Secondary | ICD-10-CM | POA: Diagnosis not present

## 2017-10-30 DIAGNOSIS — K4090 Unilateral inguinal hernia, without obstruction or gangrene, not specified as recurrent: Secondary | ICD-10-CM

## 2017-10-30 NOTE — Progress Notes (Signed)
Pt is present today due to a endocervical polyp. Pt stated that she is having pain from the area.

## 2017-10-30 NOTE — Progress Notes (Signed)
HPI:      [name] is a 32 y.o. G1P1 who LMP was Patient's last menstrual period was 10/16/2017 (exact date).  Subjective:   She presents today after being seen for right lower quadrant pain and having an ultrasound which revealed the possibility of an endocervical polyp.  Patient is on OCPs and is generally having normal regular cycles without any additional bleeding issues.  She denies significant pelvic cramping. She explains that her right lower quadrant pain is worse with exertion.  She says that it is significantly better today than it was on Monday. She is midcycle on OCPs. She is considering future pregnancy within approximately 1 year.    Hx: The following portions of the patient's history were reviewed and updated as appropriate:             She  has a past medical history of Anxiety, Back pain, Depression, Disc disease, degenerative, lumbar or lumbosacral, History of ovarian cyst, Migraine, and Scoliosis. She does not have any pertinent problems on file. She  has a past surgical history that includes Appendectomy and Breast surgery (2008). Her family history includes Alcohol abuse in her father and paternal grandfather; Arthritis in her maternal aunt; Cancer in her maternal grandfather; Cancer (age of onset: 34) in her mother; Heart disease in her maternal grandmother; Heart failure in her maternal grandmother; Vision loss in her maternal grandmother. She  reports that she quit smoking about 14 months ago. Her smoking use included cigarettes. She has never used smokeless tobacco. She reports that she drinks alcohol. She reports that she does not use drugs. She has a current medication list which includes the following prescription(s): cetirizine, escitalopram, ibuprofen, norethindrone-ethinyl estradiol 1/35, and valacyclovir. She has No Known Allergies.       Review of Systems:  Review of Systems  Constitutional: Denied constitutional symptoms, night sweats, recent  illness, fatigue, fever, insomnia and weight loss.  Eyes: Denied eye symptoms, eye pain, photophobia, vision change and visual disturbance.  Ears/Nose/Throat/Neck: Denied ear, nose, throat or neck symptoms, hearing loss, nasal discharge, sinus congestion and sore throat.  Cardiovascular: Denied cardiovascular symptoms, arrhythmia, chest pain/pressure, edema, exercise intolerance, orthopnea and palpitations.  Respiratory: Denied pulmonary symptoms, asthma, pleuritic pain, productive sputum, cough, dyspnea and wheezing.  Gastrointestinal: Denied, gastro-esophageal reflux, melena, nausea and vomiting.  Genitourinary: Denied genitourinary symptoms including symptomatic vaginal discharge, pelvic relaxation issues, and urinary complaints.  Musculoskeletal: Denied musculoskeletal symptoms, stiffness, swelling, muscle weakness and myalgia.  Dermatologic: Denied dermatology symptoms, rash and scar.  Neurologic: Denied neurology symptoms, dizziness, headache, neck pain and syncope.  Psychiatric: Denied psychiatric symptoms, anxiety and depression.  Endocrine: Denied endocrine symptoms including hot flashes and night sweats.   Meds:   Current Outpatient Medications on File Prior to Visit  Medication Sig Dispense Refill  . cetirizine (ZYRTEC) 10 MG tablet Take 10 mg by mouth daily.    Marland Kitchen escitalopram (LEXAPRO) 20 MG tablet TAKE 1 TABLET BY MOUTH DAILY. 90 tablet 3  . ibuprofen (ADVIL,MOTRIN) 800 MG tablet TAKE 1 TABLET BY MOUTH 3 TIMES DAILY WITH MEALS 90 tablet 1  . norethindrone-ethinyl estradiol 1/35 (ALAYCEN 1/35) tablet Take 1 tablet by mouth daily. 28 tablet 11  . valACYclovir (VALTREX) 1000 MG tablet Take 1 tablet (1,000 mg total) by mouth 2 (two) times daily. 12 tablet 2   No current facility-administered medications on file prior to visit.     Objective:     Vitals:   10/30/17 0831  BP: 111/77  Pulse:  80              Ultrasound results reviewed in detail showing the possibility of  endocervical polyp. I reviewed these results directly with the patient.  Physical examination   Pelvic:   Vulva: Normal appearance.  No lesions.  Vagina: No lesions or abnormalities noted.  Support: Normal pelvic support.  Urethra No masses tenderness or scarring.  Meatus Normal size without lesions or prolapse.  Cervix: Normal appearance.  No lesions.  Anus: Normal exam.  No lesions.  Perineum: Normal exam.  No lesions.        Bimanual   Uterus: Normal size.  Non-tender.  Mobile.  AV.  Adnexae: No masses.  Non-tender to palpation.  Cul-de-sac: Negative for abnormality.   ABD: Abdominal examination reveals no masses, abdomen is soft nontender.  There is pain in the right lower quadrant inguinal area which is exacerbated by Valsalva maneuver.  This pain occurs exactly where you would expect an inguinal hernia but no definite masses palpated.  This is not consistent with pelvic pain but with abdominal wall issue.  Assessment:    G1P1 Patient Active Problem List   Diagnosis Date Noted  . Right lower quadrant abdominal pain 10/28/2017  . Chest pain 09/02/2017  . Epigastric discomfort 09/02/2017  . Contraception management 08/30/2016  . Family history of breast cancer in mother 08/30/2016  . Obesity (BMI 30.0-34.9) 08/19/2014  . Amenorrhea 12/25/2013  . Anxiety and depression 07/15/2012  . Screening for STD (sexually transmitted disease) 01/10/2012     1. Unilateral inguinal hernia without obstruction or gangrene, recurrence not specified   2. Endometrial polyp     Occasionally symptomatic right inguinal hernia   No endocervical polyp could be identified by speculum examination.  Polyp is likely endometrial.   Plan:            1.  Expectant management through one menstrual cycle and then repeat ultrasound.  2.  If polyp-like structure still remains consider expectant management versus D&C.  Possibly nothing to do if patient's bleeding and cycle is not affected.  Very low  risk of cancer.   Orders No orders of the defined types were placed in this encounter.   No orders of the defined types were placed in this encounter.     F/U  Return in about 1 month (around 11/27/2017). I spent 33 minutes involved in the care of this patient of which greater than 50% was spent discussing abdominal/pelvic pain, hernia, endometrial/endocervical polyp, management of polyps, ultrasound follow-up, future fertility and polyps, risk of cancer with polyps.  Relationship of OCPs endometrial cancer and polyps.  Finis Bud, M.D. 10/30/2017 10:10 AM

## 2017-11-18 ENCOUNTER — Ambulatory Visit
Admission: RE | Admit: 2017-11-18 | Discharge: 2017-11-18 | Disposition: A | Discharge status: Home / Self Care | Payer: 59 | Source: Ambulatory Visit | Attending: Obstetrics and Gynecology | Admitting: Obstetrics and Gynecology

## 2017-11-18 ENCOUNTER — Other Ambulatory Visit: Payer: Self-pay | Admitting: Family Medicine

## 2017-11-18 ENCOUNTER — Other Ambulatory Visit: Payer: Self-pay | Admitting: Obstetrics and Gynecology

## 2017-11-18 DIAGNOSIS — N841 Polyp of cervix uteri: Secondary | ICD-10-CM | POA: Insufficient documentation

## 2017-11-18 DIAGNOSIS — N840 Polyp of corpus uteri: Secondary | ICD-10-CM

## 2017-11-20 ENCOUNTER — Encounter: Payer: Self-pay | Admitting: Obstetrics and Gynecology

## 2017-11-20 ENCOUNTER — Ambulatory Visit (INDEPENDENT_AMBULATORY_CARE_PROVIDER_SITE_OTHER): Payer: 59 | Admitting: Obstetrics and Gynecology

## 2017-11-20 VITALS — BP 109/76 | HR 74 | Ht 70.0 in | Wt 236.6 lb

## 2017-11-20 DIAGNOSIS — K4090 Unilateral inguinal hernia, without obstruction or gangrene, not specified as recurrent: Secondary | ICD-10-CM | POA: Diagnosis not present

## 2017-11-20 DIAGNOSIS — N840 Polyp of corpus uteri: Secondary | ICD-10-CM | POA: Diagnosis not present

## 2017-11-20 NOTE — Progress Notes (Signed)
HPI:      [name] is a 32 y.o. G1P1 who LMP was Patient's last menstrual period was 11/13/2017.  Subjective:   She presents today for follow-up of endometrial polyp and right inguinal hernia.  She reports that her periods are very light and regular on OCPs lasting only 1 to 2 days.  She would like to continue OCPs. She continues to have irregular pain in her right lower quadrant consistent with hernia.  This is relieved by ibuprofen and is not disabling.  She has not yet decided to seek further consult. She does complain of occasional irregularity with bowel movements. She had a follow-up ultrasound to check on the possibility of endometrial polyps.     Hx: The following portions of the patient's history were reviewed and updated as appropriate:             She  has a past medical history of Anxiety, Back pain, Depression, Disc disease, degenerative, lumbar or lumbosacral, History of ovarian cyst, Migraine, and Scoliosis. She does not have any pertinent problems on file. She  has a past surgical history that includes Appendectomy and Breast surgery (2008). Her family history includes Alcohol abuse in her father and paternal grandfather; Arthritis in her maternal aunt; Cancer in her maternal grandfather; Cancer (age of onset: 83) in her mother; Heart disease in her maternal grandmother; Heart failure in her maternal grandmother; Vision loss in her maternal grandmother. She  reports that she quit smoking about 15 months ago. Her smoking use included cigarettes. She has never used smokeless tobacco. She reports that she drinks alcohol. She reports that she does not use drugs. She has a current medication list which includes the following prescription(s): alaycen 1/35, cetirizine, escitalopram, ibuprofen, and valacyclovir. She has No Known Allergies.       Review of Systems:  Review of Systems  Constitutional: Denied constitutional symptoms, night sweats, recent illness, fatigue,  fever, insomnia and weight loss.  Eyes: Denied eye symptoms, eye pain, photophobia, vision change and visual disturbance.  Ears/Nose/Throat/Neck: Denied ear, nose, throat or neck symptoms, hearing loss, nasal discharge, sinus congestion and sore throat.  Cardiovascular: Denied cardiovascular symptoms, arrhythmia, chest pain/pressure, edema, exercise intolerance, orthopnea and palpitations.  Respiratory: Denied pulmonary symptoms, asthma, pleuritic pain, productive sputum, cough, dyspnea and wheezing.  Gastrointestinal: Denied, gastro-esophageal reflux, melena, nausea and vomiting.  Genitourinary: See HPI for additional information.  Musculoskeletal: Denied musculoskeletal symptoms, stiffness, swelling, muscle weakness and myalgia.  Dermatologic: Denied dermatology symptoms, rash and scar.  Neurologic: Denied neurology symptoms, dizziness, headache, neck pain and syncope.  Psychiatric: Denied psychiatric symptoms, anxiety and depression.  Endocrine: Denied endocrine symptoms including hot flashes and night sweats.   Meds:   Current Outpatient Medications on File Prior to Visit  Medication Sig Dispense Refill  . ALAYCEN 1/35 tablet TAKE 1 TABLET BY MOUTH DAILY. 28 tablet 3  . cetirizine (ZYRTEC) 10 MG tablet Take 10 mg by mouth daily.    Marland Kitchen escitalopram (LEXAPRO) 20 MG tablet TAKE 1 TABLET BY MOUTH DAILY. 90 tablet 3  . ibuprofen (ADVIL,MOTRIN) 800 MG tablet TAKE 1 TABLET BY MOUTH 3 TIMES DAILY WITH MEALS 90 tablet 1  . valACYclovir (VALTREX) 1000 MG tablet Take 1 tablet (1,000 mg total) by mouth 2 (two) times daily. 12 tablet 2   No current facility-administered medications on file prior to visit.     Objective:     Vitals:   11/20/17 0845  BP: 109/76  Pulse: 74  Ultrasound results reviewed directly with the patient.  Assessment:    G1P1 Patient Active Problem List   Diagnosis Date Noted  . Right lower quadrant abdominal pain 10/28/2017  . Chest pain 09/02/2017   . Epigastric discomfort 09/02/2017  . Contraception management 08/30/2016  . Family history of breast cancer in mother 08/30/2016  . Obesity (BMI 30.0-34.9) 08/19/2014  . Amenorrhea 12/25/2013  . Anxiety and depression 07/15/2012  . Screening for STD (sexually transmitted disease) 01/10/2012     1. Unilateral inguinal hernia without obstruction or gangrene, recurrence not specified   2. Endometrial polyp     Endometrial polyp seems to be gone at this time.  Ultrasound showing a small cyst in the cervix likely consistent with a nabothian cyst.  Patient is asymptomatic with good cycle control and no pain with menses.   Inguinal hernia manageable using NSAID S   Plan:            1.  Continue OCPs  2.  Patient to inform us if she changes her mind regarding further work-up for hernia  3.  Discussed bowel movements in detail including use of fiber, Citrucel, Metamucil and increased water intake. Orders No orders of the defined types were placed in this encounter.   No orders of the defined types were placed in this encounter.     F/U  Return for Annual Physical. I spent 17 minutes involved in the care of this patient of which greater than 50% was spent discussing OCPs and cycling, absence of endometrial polyp, presence of nabothian cyst, bowel movement issues, right lower quadrant pain and hernia with possible work-up.  Finis Bud, M.D. 11/20/2017 9:08 AM

## 2017-11-26 ENCOUNTER — Encounter: Payer: Self-pay | Admitting: Family Medicine

## 2017-11-26 ENCOUNTER — Encounter: Payer: 59 | Admitting: Obstetrics and Gynecology

## 2017-11-27 ENCOUNTER — Encounter: Payer: 59 | Admitting: Obstetrics and Gynecology

## 2017-12-04 ENCOUNTER — Encounter: Payer: Self-pay | Admitting: Family Medicine

## 2018-02-26 ENCOUNTER — Encounter: Payer: Self-pay | Admitting: Family Medicine

## 2018-02-26 MED ORDER — NORETHINDRONE-ETH ESTRADIOL 1-35 MG-MCG PO TABS: 1.0000 | ORAL_TABLET | Freq: Every day | ORAL | 1 refills | Status: DC

## 2018-04-15 NOTE — Progress Notes (Signed)
[facility] [address] [place], [place] [address] Phone: [phone] Subjective:    I [name] am serving as a Education administrator for Dr. Hulan Saas.   I'm seeing this patient by the request  of:    CC: Neck pain   IDP:[id number]  Autumn [name] is a 32 y.o. female coming in with complaint of neck pain. Neck pops and cracks. Pain feels like a catch on the left side. No numbness and tingling noted. Gets warm sensations and numbness in the neck. States that the pain is "under her skull".  Onset- Chronic Location- neck/ right occiput  Character- Achy pressure Aggravating factors- minimal movement Reliving factors-  Therapies tried- Topical Severity-sometimes 8 out of 10.     Past Medical History:  Diagnosis Date  . Anxiety   . Back pain    chronic  . Depression   . Disc disease, degenerative, lumbar or lumbosacral   . History of ovarian cyst   . Migraine   . Scoliosis    Past Surgical History:  Procedure Laterality Date  . APPENDECTOMY    . BREAST SURGERY  2008   lump/ benign   Social History   Socioeconomic History  . Marital status: Married    Spouse name: Not on file  . Number of children: 1  . Years of education: Not on file  . Highest education level: Not on file  Occupational History  . Occupation: CMA  Social Needs  . Financial resource strain: Not on file  . Food insecurity:    Worry: Not on file    Inability: Not on file  . Transportation needs:    Medical: Not on file    Non-medical: Not on file  Tobacco Use  . Smoking status: Former Smoker    Types: Cigarettes    Last attempt to quit: 08/18/2016    Years since quitting: 1.6  . Smokeless tobacco: Never Used  . Tobacco comment: 3 cigarettes a day  Substance and Sexual Activity  . Alcohol use: Yes    Alcohol/week: 0.0 standard drinks    Comment: occasional  . Drug use: No  . Sexual activity: Yes    Birth control/protection: Pill  Lifestyle  . Physical  activity:    Days per week: Not on file    Minutes per session: Not on file  . Stress: Not on file  Relationships  . Social connections:    Talks on phone: Not on file    Gets together: Not on file    Attends religious service: Not on file    Active member of club or organization: Not on file    Attends meetings of clubs or organizations: Not on file    Relationship status: Not on file  Other Topics Concern  . Not on file  Social History Narrative  . Not on file   No Known Allergies Family History  Problem Relation Age of Onset  . Cancer Mother 63       breast  . Alcohol abuse Father   . Vision loss Maternal Grandmother   . Heart disease Maternal Grandmother   . Heart failure Maternal Grandmother   . Cancer Maternal Grandfather   . Alcohol abuse Paternal Grandfather   . Arthritis Maternal Aunt     Current Outpatient Medications (Endocrine & Metabolic):  .  norethindrone-ethinyl estradiol 1/35 (ALAYCEN 1/35) tablet, Take 1 tablet by mouth daily.   Current Outpatient Medications (Respiratory):  .  cetirizine (ZYRTEC) 10  MG tablet, Take 10 mg by mouth daily.  Current Outpatient Medications (Analgesics):  .  ibuprofen (ADVIL,MOTRIN) 800 MG tablet, TAKE 1 TABLET BY MOUTH 3 TIMES DAILY WITH MEALS   Current Outpatient Medications (Other):  .  escitalopram (LEXAPRO) 20 MG tablet, TAKE 1 TABLET BY MOUTH DAILY. .  valACYclovir (VALTREX) 1000 MG tablet, Take 1 tablet (1,000 mg total) by mouth 2 (two) times daily.    Past medical history, social, surgical and family history all reviewed in electronic medical record.  No pertanent information unless stated regarding to the chief complaint.   Review of Systems:  No  visual changes, nausea, vomiting, diarrhea, constipation, dizziness, abdominal pain, skin rash, fevers, chills, night sweats, weight loss, swollen lymph nodes, body aches, joint swelling, , chest pain, shortness of breath, mood changes.  Positive muscle aches,  headaches  Objective  Blood pressure 110/70, pulse 80, height 5\' 10"  (1.778 m), weight 238 lb (108 kg), last menstrual period 04/02/2018, SpO2 97 %.    General: No apparent distress alert and oriented x3 mood and affect normal, dressed appropriately.  HEENT: Pupils equal, extraocular movements intact  Respiratory: Patient's speak in full sentences and does not appear short of breath  Cardiovascular: No lower extremity edema, non tender, no erythema  Skin: Warm dry intact with no signs of infection or rash on extremities or on axial skeleton.  Abdomen: Soft nontender  Neuro: Cranial nerves II through XII are intact, neurovascularly intact in all extremities with 2+ DTRs and 2+ pulses.  Lymph: No lymphadenopathy of posterior or anterior cervical chain or axillae bilaterally.  Gait normal with good balance and coordination.  MSK:  Non tender with full range of motion and good stability and symmetric strength and tone of shoulders, elbows, wrist, hip, knee and ankles bilaterally.  Neck: Inspection mild loss of lordosis. No palpable stepoffs. Negative Spurling's maneuver. Lacks 5 degrees of extension. Grip strength and sensation normal in bilateral hands Strength good C4 to T1 distribution No sensory change to C4 to T1 Negative Hoffman sign bilaterally Reflexes normal Tightness of the trapezius bilaterally.  Is tender to palpation though at the occipital region on the right side.  Osteopathic findings C2 flexed rotated and side bent right C6 flexed rotated and side bent left T3 extended rotated and side bent right inhaled third rib T5 extended rotated and side bent left     Impression and Recommendations:     This case required medical decision making of moderate complexity. The above documentation has been reviewed and is accurate and complete Lyndal Pulley, DO       Note: This dictation was prepared with Dragon dictation along with smaller phrase technology. Any  transcriptional errors that result from this process are unintentional.

## 2018-04-16 ENCOUNTER — Ambulatory Visit (INDEPENDENT_AMBULATORY_CARE_PROVIDER_SITE_OTHER): Payer: 59 | Admitting: Family Medicine

## 2018-04-16 ENCOUNTER — Ambulatory Visit (INDEPENDENT_AMBULATORY_CARE_PROVIDER_SITE_OTHER)
Admission: RE | Admit: 2018-04-16 | Discharge: 2018-04-16 | Disposition: A | Discharge status: Home / Self Care | Payer: 59 | Source: Ambulatory Visit | Attending: Family Medicine | Admitting: Family Medicine

## 2018-04-16 ENCOUNTER — Encounter: Payer: Self-pay | Admitting: Family Medicine

## 2018-04-16 VITALS — BP 110/70 | HR 80 | Ht 70.0 in | Wt 238.0 lb

## 2018-04-16 DIAGNOSIS — G8929 Other chronic pain: Secondary | ICD-10-CM | POA: Insufficient documentation

## 2018-04-16 DIAGNOSIS — M999 Biomechanical lesion, unspecified: Secondary | ICD-10-CM

## 2018-04-16 DIAGNOSIS — M542 Cervicalgia: Secondary | ICD-10-CM

## 2018-04-16 HISTORY — DX: Biomechanical lesion, unspecified: M99.9

## 2018-04-16 NOTE — Assessment & Plan Note (Signed)
Decision today to treat with OMT was based on Physical Exam  After verbal consent patient was treated with HVLA, ME, FPR techniques in cervical, thoracic, rib, lumbar and sacral areas  Patient tolerated the procedure well with improvement in symptoms  Patient given exercises, stretches and lifestyle modifications  See medications in patient instructions if given  Patient will follow up in 4 weeks 

## 2018-04-16 NOTE — Assessment & Plan Note (Signed)
Moderate to severe overall.  Discussed icing regimen and home exercise, we discussed posture and ergonomics are likely playing a role.  With patient having an audible popping sound we will get x-rays to rule out any bony abnormality that could be contributing to some of the pain.  We discussed icing regimen, home exercise, posture.  Discussed over-the-counter medications that I think will be beneficial.  Attempted osteopathic manipulation with fairly good resolution of some of the pain.  Follow-up again 4-8 weeks

## 2018-04-16 NOTE — Patient Instructions (Signed)
Good to see you  Ice 20 minutes 2 times daily. Usually after activity and before bed. Keep hands within peripheral vision  pennsaid pinkie amount topically 2 times daily as needed.  Exercises 3 times a week.  Vitamin D 2000 Iu daily  Turmeric 500mg  daily  Tart cherry extract at night  Stay active though  See me again in 4 weeks

## 2018-04-17 ENCOUNTER — Ambulatory Visit: Payer: Self-pay | Admitting: Family Medicine

## 2018-05-16 ENCOUNTER — Telehealth: Payer: Self-pay

## 2018-05-16 NOTE — Telephone Encounter (Signed)
Left message for patient to keep appointment.

## 2018-05-19 NOTE — Progress Notes (Signed)
[facility] [address], [place] [address] Phone: [phone] Subjective:    I Autumn [name] am serving as a Education administrator for Dr. Hulan Saas.  CC: Neck pain follow-up  INO:MVEHMCNOBS  Autumn [name] is a 32 y.o. female coming in with complaint of neck pain. States that she has been noncompliant. Has new symptoms. Car sickness being the passenger. Achy in the right arm that stops at the elbow. Cold sensation that radiates to the fingertips. Wants to know what a good brace would be for her wrist. Soreness for the past 10 years making ADLs uncomfortable. Achy. 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 Never had xray on wrist. Right wrist.  Onset- Chronic  Location- radial  Character-throbbing aching pain Aggravating factors- wrist flexion-  Severity-6 out of 10      Past Medical History:  Diagnosis Date  . Anxiety   . Back pain    chronic  . Depression   . Disc disease, degenerative, lumbar or lumbosacral   . History of ovarian cyst   . Migraine   . Scoliosis    Past Surgical History:  Procedure Laterality Date  . APPENDECTOMY    . BREAST SURGERY  2008   lump/ benign   Social History   Socioeconomic History  . Marital status: Married    Spouse name: Not on file  . Number of children: 1  . Years of education: Not on file  . Highest education level: Not on file  Occupational History  . Occupation: CMA  Social Needs  . Financial resource strain: Not on file  . Food insecurity:    Worry: Not on file    Inability: Not on file  . Transportation needs:    Medical: Not on file    Non-medical: Not on file  Tobacco Use  . Smoking status: Former Smoker    Types: Cigarettes    Last attempt to quit: 08/18/2016    Years since quitting: 1.7  . Smokeless tobacco: Never Used  . Tobacco comment: 3 cigarettes a day  Substance and Sexual Activity  . Alcohol use: Yes    Alcohol/week: 0.0 standard  drinks    Comment: occasional  . Drug use: No  . Sexual activity: Yes    Birth control/protection: Pill  Lifestyle  . Physical activity:    Days per week: Not on file    Minutes per session: Not on file  . Stress: Not on file  Relationships  . Social connections:    Talks on phone: Not on file    Gets together: Not on file    Attends religious service: Not on file    Active member of club or organization: Not on file    Attends meetings of clubs or organizations: Not on file    Relationship status: Not on file  Other Topics Concern  . Not on file  Social History Narrative  . Not on file   No Known Allergies Family History  Problem Relation Age of Onset  . Cancer Mother 35       breast  . Alcohol abuse Father   . Vision loss Maternal Grandmother   . Heart disease Maternal Grandmother   . Heart failure Maternal Grandmother   . Cancer Maternal Grandfather   . Alcohol abuse Paternal Grandfather   . Arthritis Maternal Aunt     Current Outpatient Medications (Endocrine & Metabolic):  .  norethindrone-ethinyl estradiol  1/35 (ALAYCEN 1/35) tablet, Take 1 tablet by mouth daily.   Current Outpatient Medications (Respiratory):  .  cetirizine (ZYRTEC) 10 MG tablet, Take 10 mg by mouth daily.  Current Outpatient Medications (Analgesics):  .  ibuprofen (ADVIL,MOTRIN) 800 MG tablet, TAKE 1 TABLET BY MOUTH 3 TIMES DAILY WITH MEALS   Current Outpatient Medications (Other):  .  escitalopram (LEXAPRO) 20 MG tablet, TAKE 1 TABLET BY MOUTH DAILY. .  valACYclovir (VALTREX) 1000 MG tablet, Take 1 tablet (1,000 mg total) by mouth 2 (two) times daily. Marland Kitchen  gabapentin (NEURONTIN) 100 MG capsule, Take 2 capsules (200 mg total) by mouth at bedtime.    Past medical history, social, surgical and family history all reviewed in electronic medical record.  No pertanent information unless stated regarding to the chief complaint.   Review of Systems:  No headache, visual changes, nausea,  vomiting, diarrhea, constipation, dizziness, abdominal pain, skin rash, fevers, chills, night sweats, weight loss, swollen lymph nodes, body aches, joint swelling,  chest pain, shortness of breath, mood changes.   Objective  Blood pressure 110/80, pulse 83, height 5\' 10"  (1.778 m), weight 236 lb (107 kg), last menstrual period 05/18/2018, SpO2 98 %.    General: No apparent distress alert and oriented x3 mood and affect normal, dressed appropriately.  HEENT: Pupils equal, extraocular movements intact  Respiratory: Patient's speak in full sentences and does not appear short of breath  Cardiovascular: No lower extremity edema, non tender, no erythema  Skin: Warm dry intact with no signs of infection or rash on extremities or on axial skeleton.  Abdomen: Soft nontender  Neuro: Cranial nerves II through XII are intact, neurovascularly intact in all extremities with 2+ DTRs and 2+ pulses.  Lymph: No lymphadenopathy of posterior or anterior cervical chain or axillae bilaterally.  Gait normal with good balance and coordination.  MSK:  Non tender with full range of motion and good stability and symmetric strength and tone of shoulders, elbows, hip, knee and ankles bilaterally.  Wrist: Right Inspection normal with no visible erythema or swelling. ROM s patient does have pain with flexion of the wrist especially with extension and flexion of the wrist has more pain over the radial aspect of the wrist.  Otherwise good grip strength.   TFCC; tendons without tenderness/ swelling No snuffbox tenderness. No tenderness over Canal of [place]. Strength 5/5 in all directions without pain. Negative Finkelstein, tinel's and phalens. Negative Watson's test. Neck: Inspection mild loss of lordosis. No palpable stepoffs. Mild positive Spurling's maneuver. Mild limited range of motion in all planes Patient does have weakness in the C6 distribution laterally compared to contralateral side. Negative Hoffman sign  bilaterally Reflexes normal Tightness bilaterally  Osteopathic findings C2 flexed rotated and side bent right T3 extended rotated and side bent right inhaled third rib T5 extended rotated and side bent left L2 flexed rotated and side bent right Sacrum right on right  Limited musculoskeletal ultrasound was performed and interpreted by Lyndal Pulley  Limited ultrasound of patient's wrist shows a mild tenosynovitis.  Patient does have some anatomical variance around the radial bone.  Difficult to assess on the ultrasound.  No true cortical defect noted.   Impression and Recommendations:     This case required medical decision making of moderate complexity. The above documentation has been reviewed and is accurate and complete Lyndal Pulley, DO       Note: This dictation was prepared with Dragon dictation along with smaller phrase technology. Any transcriptional errors that  result from this process are unintentional.

## 2018-05-20 ENCOUNTER — Ambulatory Visit: Payer: Self-pay

## 2018-05-20 ENCOUNTER — Ambulatory Visit: Payer: 59 | Admitting: Family Medicine

## 2018-05-20 ENCOUNTER — Ambulatory Visit (INDEPENDENT_AMBULATORY_CARE_PROVIDER_SITE_OTHER)
Admission: RE | Admit: 2018-05-20 | Discharge: 2018-05-20 | Disposition: A | Discharge status: Home / Self Care | Payer: Self-pay | Source: Ambulatory Visit | Attending: Family Medicine | Admitting: Family Medicine

## 2018-05-20 ENCOUNTER — Encounter: Payer: Self-pay | Admitting: Family Medicine

## 2018-05-20 VITALS — BP 110/80 | HR 83 | Ht 70.0 in | Wt 236.0 lb

## 2018-05-20 DIAGNOSIS — M542 Cervicalgia: Secondary | ICD-10-CM | POA: Diagnosis not present

## 2018-05-20 DIAGNOSIS — M25531 Pain in right wrist: Secondary | ICD-10-CM

## 2018-05-20 DIAGNOSIS — M999 Biomechanical lesion, unspecified: Secondary | ICD-10-CM | POA: Diagnosis not present

## 2018-05-20 DIAGNOSIS — G8929 Other chronic pain: Secondary | ICD-10-CM

## 2018-05-20 MED ORDER — GABAPENTIN 100 MG PO CAPS: 200.0000 mg | ORAL_CAPSULE | Freq: Every day | ORAL | 3 refills | Status: DC

## 2018-05-20 NOTE — Assessment & Plan Note (Signed)
Decision today to treat with OMT was based on Physical Exam  After verbal consent patient was treated with HVLA, ME, FPR techniques in cervical, thoracic, rib,  lumbar and sacral areas  Patient tolerated the procedure well with improvement in symptoms  Patient given exercises, stretches and lifestyle modifications  See medications in patient instructions if given  Patient will follow up in 4-8 weeks 

## 2018-05-20 NOTE — Assessment & Plan Note (Signed)
Wrist pain.  Radial side.  Worse with flexion and radial deviation of the wrist.  Will monitor.  X-rays pending

## 2018-05-20 NOTE — Assessment & Plan Note (Signed)
Pain mild radiation.  Discussed icing regimen and home exercise.  Discussed which activities to do which wants to avoid.  Continue to be active otherwise.  New onset of wrist pain.

## 2018-05-20 NOTE — Patient Instructions (Signed)
Good to see you  Gabapentin 200mg  at night Keep doing there neck exercises Do wrap the wrist with a lot of activity and try to avoid overhand lifting See me again in 4-6 weeks and we will discuss meds and check in on how you are doing

## 2018-05-22 ENCOUNTER — Telehealth: Payer: Self-pay

## 2018-05-22 NOTE — Telephone Encounter (Signed)
Copied from [place] [id number]. Topic: General - Other >> May 22, 2018  1:31 PM Ivar Drape wrote: Reason for CRM:   Patient would like a return call with her imaging results

## 2018-05-22 NOTE — Telephone Encounter (Signed)
Spoke with patient per verbal x-ray results from Dr. Tamala Julian.

## 2018-05-30 ENCOUNTER — Encounter: Payer: Self-pay | Admitting: Primary Care

## 2018-05-30 ENCOUNTER — Ambulatory Visit: Payer: 59 | Admitting: Primary Care

## 2018-05-30 VITALS — BP 120/82 | HR 87 | Temp 97.9°F | Ht 70.0 in

## 2018-05-30 DIAGNOSIS — H9201 Otalgia, right ear: Secondary | ICD-10-CM

## 2018-05-30 MED ORDER — AMOXICILLIN 875 MG PO TABS: 875.0000 mg | ORAL_TABLET | Freq: Two times a day (BID) | ORAL | 0 refills | Status: DC

## 2018-05-30 NOTE — Patient Instructions (Signed)
Start amoxicillin antibiotics for the infection. Take 1 tablet by mouth twice daily for 7 days.  You can take Ibuprofen 600 mg every 8 hours or Tylenol 650 mg every 8 hours as needed for pain.  Try switching to levocetirizine (Xyzal) for allergies.  It was a pleasure to see you today!

## 2018-05-30 NOTE — Progress Notes (Signed)
Subjective:    Patient ID: Autumn [name], female    DOB: 1985/11/01, 32 y.o.   MRN: [id number]  HPI  Ms. Kendrix is a 32 year old female with a history of acute otitis media, chronic allergic rhinitis with post nasal drip who presents today with a chief complaint of otalgia.  Her pain is located to the right ear which began three days ago. She also reports sore throat and headaches which has improved. This morning she noticed skin sensitivity to the right temporal region when putting on make up this morning. She denies fevers.   She's been taking her Zyrtec daily. She had a severe bilateral otitis media in March 2019, this feels like the early stages of her prior infection.   Review of Systems  Constitutional: Negative for fatigue and fever.  HENT: Positive for ear pain and postnasal drip. Negative for sinus pressure.        Right ear pain  Respiratory: Negative for cough.        Past Medical History:  Diagnosis Date  . Anxiety   . Back pain    chronic  . Depression   . Disc disease, degenerative, lumbar or lumbosacral   . History of ovarian cyst   . Migraine   . Scoliosis      Social History   Socioeconomic History  . Marital status: Married    Spouse name: Not on file  . Number of children: 1  . Years of education: Not on file  . Highest education level: Not on file  Occupational History  . Occupation: CMA  Social Needs  . Financial resource strain: Not on file  . Food insecurity:    Worry: Not on file    Inability: Not on file  . Transportation needs:    Medical: Not on file    Non-medical: Not on file  Tobacco Use  . Smoking status: Former Smoker    Types: Cigarettes    Last attempt to quit: 08/18/2016    Years since quitting: 1.7  . Smokeless tobacco: Never Used  . Tobacco comment: 3 cigarettes a day  Substance and Sexual Activity  . Alcohol use: Yes    Alcohol/week: 0.0 standard drinks    Comment: occasional  . Drug use: No  . Sexual  activity: Yes    Birth control/protection: Pill  Lifestyle  . Physical activity:    Days per week: Not on file    Minutes per session: Not on file  . Stress: Not on file  Relationships  . Social connections:    Talks on phone: Not on file    Gets together: Not on file    Attends religious service: Not on file    Active member of club or organization: Not on file    Attends meetings of clubs or organizations: Not on file    Relationship status: Not on file  . Intimate partner violence:    Fear of current or ex partner: Not on file    Emotionally abused: Not on file    Physically abused: Not on file    Forced sexual activity: Not on file  Other Topics Concern  . Not on file  Social History Narrative  . Not on file    Past Surgical History:  Procedure Laterality Date  . APPENDECTOMY    . BREAST SURGERY  2008   lump/ benign    Family History  Problem Relation Age of Onset  . Cancer Mother 33  breast  . Alcohol abuse Father   . Vision loss Maternal Grandmother   . Heart disease Maternal Grandmother   . Heart failure Maternal Grandmother   . Cancer Maternal Grandfather   . Alcohol abuse Paternal Grandfather   . Arthritis Maternal Aunt     No Known Allergies  Current Outpatient Medications on File Prior to Visit  Medication Sig Dispense Refill  . cetirizine (ZYRTEC) 10 MG tablet Take 10 mg by mouth daily.    Marland Kitchen escitalopram (LEXAPRO) 20 MG tablet TAKE 1 TABLET BY MOUTH DAILY. 90 tablet 3  . ibuprofen (ADVIL,MOTRIN) 800 MG tablet TAKE 1 TABLET BY MOUTH 3 TIMES DAILY WITH MEALS 90 tablet 1  . norethindrone-ethinyl estradiol 1/35 (ALAYCEN 1/35) tablet Take 1 tablet by mouth daily. 84 tablet 1  . valACYclovir (VALTREX) 1000 MG tablet Take 1 tablet (1,000 mg total) by mouth 2 (two) times daily. 12 tablet 2  . gabapentin (NEURONTIN) 100 MG capsule Take 2 capsules (200 mg total) by mouth at bedtime. (Patient not taking: Reported on 05/30/2018) 60 capsule 3   No current  facility-administered medications on file prior to visit.     BP 120/82 (BP Location: Left Arm, Patient Position: Sitting, Cuff Size: Normal)   Pulse 87   Temp 97.9 F (36.6 C) (Oral)   Ht 5\' 10"  (1.778 m)   LMP 05/18/2018 (Approximate)   SpO2 98%   BMI 33.86 kg/m    Objective:   Physical Exam  Constitutional: She appears well-nourished. She does not appear ill.  HENT:  Right Ear: Ear canal normal.  Left Ear: Tympanic membrane and ear canal normal.  Nose: No mucosal edema. Right sinus exhibits no maxillary sinus tenderness and no frontal sinus tenderness. Left sinus exhibits no maxillary sinus tenderness and no frontal sinus tenderness.  Mouth/Throat: Oropharynx is clear and moist.  Mild erythema with mild bulging to right TM. Canal unremarkable.   Neck: Neck supple.  Cardiovascular: Normal rate and regular rhythm.  Respiratory: Effort normal and breath sounds normal. She has no wheezes.  Skin: Skin is warm and dry.           Assessment & Plan:  Otalgia/Early Acute Otitis Media:  Right ear pain x 3 days, similar feeling to prior right ear infection. Exam today with evidence of early infection. Rx for Amoxil course sent to pharmacy. Discussed Ibuprofen/Tylenol PRN. Follow up PRN.  Pleas Koch, NP

## 2018-06-17 ENCOUNTER — Encounter: Payer: Self-pay | Admitting: Family Medicine

## 2018-06-18 ENCOUNTER — Ambulatory Visit: Payer: Self-pay | Admitting: Family Medicine

## 2018-06-20 ENCOUNTER — Ambulatory Visit: Payer: 59 | Admitting: Family Medicine

## 2018-06-20 ENCOUNTER — Encounter: Payer: Self-pay | Admitting: Family Medicine

## 2018-06-20 DIAGNOSIS — B029 Zoster without complications: Secondary | ICD-10-CM

## 2018-06-20 HISTORY — DX: Zoster without complications: B02.9

## 2018-06-20 MED ORDER — VALACYCLOVIR HCL 1 G PO TABS: 1000.0000 mg | ORAL_TABLET | Freq: Three times a day (TID) | ORAL | 0 refills | Status: DC

## 2018-06-20 NOTE — Progress Notes (Signed)
Subjective:    Patient ID: Autumn [name], female    DOB: 10-Nov-1985, 33 y.o.   MRN: [id number]  HPI   33 year old immunocompetent female pt with new onset  Hypersensitivity on left lateral hip 3 days ago.  Today burning and pain in the area. Radiating down mid thigh.  Pant irritating the area.  Chills.. no fever. Achy, fatigue.   Rash appeared this AM.. small red bumps.  Has not taken any medication for it.  Hx of shingles in past  On buttocks age.  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  Social History /Family History/Past Medical History reviewed in detail and updated in EMR if needed.  Review of Systems  Constitutional: Negative for fatigue and fever.  HENT: Negative for congestion and ear pain.   Eyes: Negative for pain.  Respiratory: Negative for cough, chest tightness and shortness of breath.   Cardiovascular: Negative for chest pain, palpitations and leg swelling.  Gastrointestinal: Negative for abdominal pain.  Genitourinary: Negative for dysuria and vaginal bleeding.  Musculoskeletal: Negative for back pain.  Neurological: Negative for syncope, light-headedness and headaches.  Psychiatric/Behavioral: Negative for dysphoric mood.       Objective:   Physical Exam Constitutional:      General: She is not in acute distress.    Appearance: Normal appearance. She is well-developed. She is not ill-appearing or toxic-appearing.  HENT:     Head: Normocephalic.     Right Ear: Hearing, tympanic membrane, ear canal and external ear normal. Tympanic membrane is not erythematous, retracted or bulging.     Left Ear: Hearing, tympanic membrane, ear canal and external ear normal. Tympanic membrane is not erythematous, retracted or bulging.     Nose: No mucosal edema or rhinorrhea.     Right Sinus: No maxillary sinus tenderness or frontal sinus tenderness.     Left Sinus: No maxillary sinus tenderness or frontal sinus tenderness.     Mouth/Throat:   Pharynx: Uvula midline.  Eyes:     General: Lids are normal. Lids are everted, no foreign bodies appreciated.     Conjunctiva/sclera: Conjunctivae normal.     Pupils: Pupils are equal, round, and reactive to light.  Neck:     Musculoskeletal: Normal range of motion and neck supple.     Thyroid: No thyroid mass or thyromegaly.     Vascular: No carotid bruit.     Trachea: Trachea normal.  Cardiovascular:     Rate and Rhythm: Normal rate and regular rhythm.     Pulses: Normal pulses.     Heart sounds: Normal heart sounds, S1 normal and S2 normal. No murmur. No friction rub. No gallop.   Pulmonary:     Effort: Pulmonary effort is normal. No tachypnea or respiratory distress.     Breath sounds: Normal breath sounds. No decreased breath sounds, wheezing, rhonchi or rales.  Abdominal:     General: Bowel sounds are normal.     Palpations: Abdomen is soft.     Tenderness: There is no abdominal tenderness.  Skin:    General: Skin is warm and dry.     Findings: No rash.          Comments:  Hypersensitivity and burning in left lateral hip and upper buttock.. L2 distribution.  Neurological:     Mental Status: She is alert.  Psychiatric:        Mood and Affect: Mood is not anxious or depressed.        Speech: Speech normal.  Behavior: Behavior normal. Behavior is cooperative.        Thought Content: Thought content normal.        Judgment: Judgment normal.           Assessment & Plan:

## 2018-06-20 NOTE — Assessment & Plan Note (Signed)
Treat with valacyclovir TID x 7 days.  Pain controlled emphasized.

## 2018-06-20 NOTE — Patient Instructions (Addendum)
Tylenol for pain as needed.  Start valacyclovir course.  Let me know if pain not controlled.   Shingles  Shingles, which is also known as herpes zoster, is an infection that causes a painful skin rash and fluid-filled blisters. It is caused by a virus. Shingles only develops in people who:  Have had chickenpox.  Have been given a medicine to protect against chickenpox (have been vaccinated). Shingles is rare in this group. What are the causes? Shingles is caused by varicella-zoster virus (VZV). This is the same virus that causes chickenpox. After a person is exposed to VZV, the virus stays in the body in an inactive (dormant) state. Shingles develops if the virus is reactivated. This can happen many years after the first (initial) exposure to VZV. It is not known what causes this virus to be reactivated. What increases the risk? People who have had chickenpox or received the chickenpox vaccine are at risk for shingles. Shingles infection is more common in people who:  Are older than age 80.  Have a weakened disease-fighting system (immune system), such as people with: ? HIV. ? AIDS. ? Cancer.  Are taking medicines that weaken the immune system, such as transplant medicines.  Are experiencing a lot of stress. What are the signs or symptoms? Early symptoms of this condition include itching, tingling, and pain in an area on your skin. Pain may be described as burning, stabbing, or throbbing. A few days or weeks after early symptoms start, a painful red rash appears. The rash is usually on one side of the body and has a band-like or belt-like pattern. The rash eventually turns into fluid-filled blisters that break open, change into scabs, and dry up in about 2-3 weeks. At any time during the infection, you may also develop:  A fever.  Chills.  A headache.  An upset stomach. How is this diagnosed? This condition is diagnosed with a skin exam. Skin or fluid samples may be taken  from the blisters before a diagnosis is made. These samples are examined under a microscope or sent to a lab for testing. How is this treated? The rash may last for several weeks. There is not a specific cure for this condition. Your health care provider will probably prescribe medicines to help you manage pain, recover more quickly, and avoid long-term problems. Medicines may include:  Antiviral drugs.  Anti-inflammatory drugs.  Pain medicines.  Anti-itching medicines (antihistamines). If the area involved is on your face, you may be referred to a specialist, such as an eye doctor (ophthalmologist) or an ear, nose, and throat (ENT) doctor (otolaryngologist) to help you avoid eye problems, chronic pain, or disability. Follow these instructions at home: Medicines  Take over-the-counter and prescription medicines only as told by your health care provider.  Apply an anti-itch cream or numbing cream to the affected area as told by your health care provider. Relieving itching and discomfort   Apply cold, wet cloths (cold compresses) to the area of the rash or blisters as told by your health care provider.  Cool baths can be soothing. Try adding baking soda or dry oatmeal to the water to reduce itching. Do not bathe in hot water. Blister and rash care  Keep your rash covered with a loose bandage (dressing). Wear loose-fitting clothing to help ease the pain of material rubbing against the rash.  Keep your rash and blisters clean by washing the area with mild soap and cool water as told by your health care provider.  Check your rash every day for signs of infection. Check for: ? More redness, swelling, or pain. ? Fluid or blood. ? Warmth. ? Pus or a bad smell.  Do not scratch your rash or pick at your blisters. To help avoid scratching: ? Keep your fingernails clean and cut short. ? Wear gloves or mittens while you sleep, if scratching is a problem. General instructions  Rest as told  by your health care provider.  Keep all follow-up visits as told by your health care provider. This is important.  Wash your hands often with soap and water. If soap and water are not available, use hand sanitizer. Doing this lowers your chance of getting a bacterial skin infection.  Before your blisters change into scabs, your shingles infection can cause chickenpox in people who have never had it or have never been vaccinated against it. To prevent this from happening, avoid contact with other people, especially: ? Babies. ? Pregnant women. ? Children who have eczema. ? Elderly people who have transplants. ? People who have chronic illnesses, such as cancer or AIDS. Contact a health care provider if:  Your pain is not relieved with prescribed medicines.  Your pain does not get better after the rash heals.  You have signs of infection in the rash area, such as: ? More redness, swelling, or pain around the rash. ? Fluid or blood coming from the rash. ? The rash area feeling warm to the touch. ? Pus or a bad smell coming from the rash. Get help right away if:  The rash is on your face or nose.  You have facial pain, pain around your eye area, or loss of feeling on one side of your face.  You have difficulty seeing.  You have ear pain or have ringing in your ear.  You have a loss of taste.  Your condition gets worse. Summary  Shingles, which is also known as herpes zoster, is an infection that causes a painful skin rash and fluid-filled blisters.  This condition is diagnosed with a skin exam. Skin or fluid samples may be taken from the blisters and examined before the diagnosis is made.  Keep your rash covered with a loose bandage (dressing). Wear loose-fitting clothing to help ease the pain of material rubbing against the rash.  Before your blisters change into scabs, your shingles infection can cause chickenpox in people who have never had it or have never been vaccinated  against it. This information is not intended to replace advice given to you by your health care provider. Make sure you discuss any questions you have with your health care provider. Document Released: 05/28/2005 Document Revised: 01/30/2017 Document Reviewed: 01/30/2017 Elsevier Interactive Patient Education  2019 [employer].

## 2018-07-01 NOTE — Progress Notes (Signed)
[name] [address] [name], [name] [address] Phone: [phone] Subjective:    I Autumn [name] am serving as a Education administrator for Dr. Hulan Saas.    CC: Back and neck pain follow-up, wrist pain follow-up  KCL:[id number]  Autumn [name] is a 33 y.o. female coming in with complaint of wrist, neck and back pain. States that her left shoulder pain. States that it is muscular and wraps around the arm. States that it looks like she has a "dent" in her arm.   Onset- 1 month Location- Deltoid area Character- sore achy  Aggravating factors- abduction, putting her purse on    Patient rates the severity of pain is 5 out of 10  Past Medical History:  Diagnosis Date  . Anxiety   . Back pain    chronic  . Depression   . Disc disease, degenerative, lumbar or lumbosacral   . History of ovarian cyst   . Migraine   . Scoliosis    Past Surgical History:  Procedure Laterality Date  . APPENDECTOMY    . BREAST SURGERY  2008   lump/ benign   Social History   Socioeconomic History  . Marital status: Married    Spouse name: Not on file  . Number of children: 1  . Years of education: Not on file  . Highest education level: Not on file  Occupational History  . Occupation: CMA  Social Needs  . Financial resource strain: Not on file  . Food insecurity:    Worry: Not on file    Inability: Not on file  . Transportation needs:    Medical: Not on file    Non-medical: Not on file  Tobacco Use  . Smoking status: Former Smoker    Types: Cigarettes    Last attempt to quit: 08/18/2016    Years since quitting: 1.8  . Smokeless tobacco: Never Used  . Tobacco comment: 3 cigarettes a day  Substance and Sexual Activity  . Alcohol use: Yes    Alcohol/week: 0.0 standard drinks    Comment: occasional  . Drug use: No  . Sexual activity: Yes    Birth control/protection: Pill  Lifestyle  . Physical activity:    Days per week: Not on file    Minutes per  session: Not on file  . Stress: Not on file  Relationships  . Social connections:    Talks on phone: Not on file    Gets together: Not on file    Attends religious service: Not on file    Active member of club or organization: Not on file    Attends meetings of clubs or organizations: Not on file    Relationship status: Not on file  Other Topics Concern  . Not on file  Social History Narrative  . Not on file   No Known Allergies Family History  Problem Relation Age of Onset  . Cancer Mother 69       breast  . Alcohol abuse Father   . Vision loss Maternal Grandmother   . Heart disease Maternal Grandmother   . Heart failure Maternal Grandmother   . Cancer Maternal Grandfather   . Alcohol abuse Paternal Grandfather   . Arthritis Maternal Aunt     Current Outpatient Medications (Endocrine & Metabolic):  .  norethindrone-ethinyl estradiol 1/35 (ALAYCEN 1/35) tablet, Take 1 tablet by mouth daily.    Current Outpatient Medications (Analgesics):  .  ibuprofen (ADVIL,MOTRIN) 800 MG tablet, Take 800  mg by mouth 3 (three) times daily with meals as needed.   Current Outpatient Medications (Other):  .  escitalopram (LEXAPRO) 20 MG tablet, TAKE 1 TABLET BY MOUTH DAILY. .  valACYclovir (VALTREX) 1000 MG tablet, Take 1,000 mg by mouth 2 (two) times daily as needed. .  valACYclovir (VALTREX) 1000 MG tablet, Take 1 tablet (1,000 mg total) by mouth 3 (three) times daily.    Past medical history, social, surgical and family history all reviewed in electronic medical record.  No pertanent information unless stated regarding to the chief complaint.   Review of Systems:  No headache, visual changes, nausea, vomiting, diarrhea, constipation, dizziness, abdominal pain, skin rash, fevers, chills, night sweats, weight loss, swollen lymph nodes, body aches, joint swelling, muscle aches, chest pain, shortness of breath, mood changes.   Objective  There were no vitals taken for this  visit. Systems examined below as of    General: No apparent distress alert and oriented x3 mood and affect normal, dressed appropriately.  HEENT: Pupils equal, extraocular movements intact  Respiratory: Patient's speak in full sentences and does not appear short of breath  Cardiovascular: No lower extremity edema, non tender, no erythema  Skin: Warm dry intact with no signs of infection or rash on extremities or on axial skeleton.  Abdomen: Soft nontender  Neuro: Cranial nerves II through XII are intact, neurovascularly intact in all extremities with 2+ DTRs and 2+ pulses.  Lymph: No lymphadenopathy of posterior or anterior cervical chain or axillae bilaterally.  Gait normal with good balance and coordination.  MSK:  Non tender with full range of motion and good stability and symmetric strength and tone of  elbows, wrist, hip, knee and ankles bilaterally.  Shoulder: Left Inspection reveals no abnormalities, atrophy or asymmetry. Palpation is normal with no tenderness over AC joint or bicipital groove. ROM is full in all planes. Rotator cuff strength normal throughout. Positive impingement with Neer's and Hawkins. Speeds and Yergason's tests normal. No labral pathology noted with negative Obrien's, negative clunk and good stability. Normal scapular function observed. No painful arc and no drop arm sign. No apprehension sign Contralateral shoulder unremarkable  Back Exam:  Inspection: Unremarkable  Motion: Flexion 45 deg, Extension 25 deg, Side Bending to 30deg bilaterally,  Rotation to 35 deg bilaterally  SLR laying: Negative  XSLR laying: Negative  Palpable tenderness: Tender to palpation of paraspinal musculature lumbar spine right greater than left. FABER: Tightness bilaterally. Sensory change: Gross sensation intact to all lumbar and sacral dermatomes.  Reflexes: 2+ at both patellar tendons, 2+ at achilles tendons, Babinski's downgoing.  Strength at foot  Plantar-flexion: 5/5  Dorsi-flexion: 5/5 Eversion: 5/5 Inversion: 5/5  Leg strength  Quad: 5/5 Hamstring: 5/5 Hip flexor: 5/5 Hip abductors: 5/5  Gait unremarkable.  Neck: Inspection unremarkable. No palpable stepoffs. Negative Spurling's maneuver. Full neck range of motion Grip strength and sensation normal in bilateral hands Strength good C4 to T1 distribution No sensory change to C4 to T1 Negative Hoffman sign bilaterally Reflexes normal  Osteopathic findings C2 flexed rotated and side bent right C4 flexed rotated and side bent left C6 flexed rotated and side bent left T3 extended rotated and side bent right inhaled third rib T9 extended rotated and side bent left L2 flexed rotated and side bent right Sacrum right on right  [id number]; 15 additional minutes spent for Therapeutic exercises as stated in above notes.  This included exercises focusing on stretching, strengthening, with significant focus on eccentric aspects.   Long term goals  include an improvement in range of motion, strength, endurance as well as avoiding reinjury. Patient's frequency would include in 1-2 times a day, 3-5 times a week for a duration of 6-12 weeks. Shoulder Exercises that included:  Basic scapular stabilization to include adduction and depression of scapula Scaption, focusing on proper movement and good control Internal and External rotation utilizing a theraband, with elbow tucked at side entire time Rows with theraband which was given  Proper technique shown and discussed handout in great detail with ATC.  All questions were discussed and answered.      Impression and Recommendations:     This case required medical decision making of moderate complexity. The above documentation has been reviewed and is accurate and complete Lyndal Pulley, DO       Note: This dictation was prepared with Dragon dictation along with smaller phrase technology. Any transcriptional errors that result from this process are  unintentional.

## 2018-07-02 ENCOUNTER — Ambulatory Visit (INDEPENDENT_AMBULATORY_CARE_PROVIDER_SITE_OTHER): Payer: 59 | Admitting: Family Medicine

## 2018-07-02 ENCOUNTER — Encounter: Payer: Self-pay | Admitting: Family Medicine

## 2018-07-02 VITALS — BP 124/84 | HR 80 | Ht 70.0 in | Wt 232.0 lb

## 2018-07-02 DIAGNOSIS — M999 Biomechanical lesion, unspecified: Secondary | ICD-10-CM | POA: Diagnosis not present

## 2018-07-02 DIAGNOSIS — M7552 Bursitis of left shoulder: Secondary | ICD-10-CM

## 2018-07-02 DIAGNOSIS — M542 Cervicalgia: Secondary | ICD-10-CM

## 2018-07-02 NOTE — Assessment & Plan Note (Signed)
Responding well to osteopathic manipulation.  Discussed posture and ergonomics.  We discussed ergonomics at work that I think will be very beneficial.  Continue same over-the-counter medications.  Follow-up again with me 4 to 8 weeks.

## 2018-07-02 NOTE — Assessment & Plan Note (Signed)
Decision today to treat with OMT was based on Physical Exam  After verbal consent patient was treated with HVLA, ME, FPR techniques in cervical, thoracic, rib areas  Patient tolerated the procedure well with improvement in symptoms  Patient given exercises, stretches and lifestyle modifications  See medications in patient instructions if given  Patient will follow up in 4-8 weeks 

## 2018-07-02 NOTE — Assessment & Plan Note (Signed)
Shoulder bursitis, work with Product/process development scientist to learn home exercises, topical anti-inflammatories, icing regimen, monitor otherwise.  Follow-up again in 4 to 8 weeks

## 2018-07-02 NOTE — Patient Instructions (Addendum)
Good to see you  Ice is you friend pennsaid pinkie amount topically 2 times daily as needed.  Keep hands within peripheral vision  Exercises 3 times a week.  Stay active See me again in 5-6 weeks

## 2018-07-05 ENCOUNTER — Encounter: Payer: Self-pay | Admitting: Family Medicine

## 2018-07-05 ENCOUNTER — Ambulatory Visit (INDEPENDENT_AMBULATORY_CARE_PROVIDER_SITE_OTHER): Payer: Self-pay | Admitting: Family Medicine

## 2018-07-05 VITALS — BP 100/70 | HR 98 | Temp 98.2°F | Resp 18

## 2018-07-05 DIAGNOSIS — R6889 Other general symptoms and signs: Secondary | ICD-10-CM

## 2018-07-05 DIAGNOSIS — J029 Acute pharyngitis, unspecified: Secondary | ICD-10-CM

## 2018-07-05 DIAGNOSIS — B009 Herpesviral infection, unspecified: Secondary | ICD-10-CM

## 2018-07-05 LAB — POCT INFLUENZA A/B
Influenza A, POC: NEGATIVE
Influenza B, POC: NEGATIVE

## 2018-07-05 LAB — POCT RAPID STREP A (OFFICE): Rapid Strep A Screen: NEGATIVE

## 2018-07-05 MED ORDER — AMOXICILLIN 500 MG PO CAPS: 500.0000 mg | ORAL_CAPSULE | Freq: Two times a day (BID) | ORAL | 0 refills | Status: DC

## 2018-07-05 NOTE — Progress Notes (Signed)
Autumn [name] is a 33 y.o. female who presents today with 6 days of progressive symptoms of sore throat, body aches and general feeling of being unwell. She has attempted treatments including tylenol, motrin and supportive care without much symptom relief. Of note recent hx and treatment for shingles, no other known sick contacts at home or work with similar symptoms.  Review of Systems  Constitutional: Negative for chills, fever and malaise/fatigue.  HENT: Positive for sore throat. Negative for congestion, ear discharge, ear pain and sinus pain.   Eyes: Negative.   Respiratory: Negative for cough, sputum production and shortness of breath.   Cardiovascular: Negative.  Negative for chest pain.  Gastrointestinal: Negative for abdominal pain, diarrhea, nausea and vomiting.  Genitourinary: Negative for dysuria, frequency, hematuria and urgency.  Musculoskeletal: Negative for myalgias.  Skin: Positive for rash.       Nasal lesion x 1 day  Neurological: Negative for headaches.  Endo/Heme/Allergies: Negative.   Psychiatric/Behavioral: Negative.     [name] has a current medication list which includes the following prescription(s): diclofenac sodium, prenatal multivitamin, and amoxicillin. Also has No Known Allergies.  [name]  has a past medical history of Anxiety, Back pain, Depression, Disc disease, degenerative, lumbar or lumbosacral, History of ovarian cyst, Migraine, and Scoliosis. Also  has a past surgical history that includes Appendectomy and Breast surgery (2008).    O: Vitals:   07/05/18 1208  BP: 100/70  Pulse: 98  Resp: 18  Temp: 98.2 F (36.8 C)  SpO2: 97%     Physical Exam Vitals signs reviewed.  Constitutional:      Appearance: She is well-developed. She is not toxic-appearing.  HENT:     Head: Normocephalic.     Right Ear: Hearing, tympanic membrane, ear canal and external ear normal.     Left Ear: Hearing, tympanic membrane, ear canal and external ear  normal.     Nose: Congestion and rhinorrhea present.     Right Sinus: No maxillary sinus tenderness or frontal sinus tenderness.     Left Sinus: No maxillary sinus tenderness or frontal sinus tenderness.     Mouth/Throat:     Mouth: Mucous membranes are moist.     Pharynx: Uvula midline.     Tonsils: Tonsillar exudate present. No tonsillar abscesses. Swelling: 2+ on the right. 2+ on the left.      Comments: Nasal lesion consistent with herpes simplex Neck:     Musculoskeletal: Normal range of motion and neck supple.  Cardiovascular:     Rate and Rhythm: Normal rate and regular rhythm.     Pulses: Normal pulses.     Heart sounds: Normal heart sounds.  Pulmonary:     Effort: Pulmonary effort is normal.     Breath sounds: Normal breath sounds.  Abdominal:     General: Bowel sounds are normal.     Palpations: Abdomen is soft.  Musculoskeletal: Normal range of motion.  Lymphadenopathy:     Head:     Right side of head: Tonsillar adenopathy present. No submental or submandibular adenopathy.     Left side of head: Tonsillar adenopathy present. No submental or submandibular adenopathy.     Cervical: No cervical adenopathy.  Neurological:     Mental Status: She is alert and oriented to person, place, and time.  Psychiatric:        Behavior: Behavior is cooperative.    A: 1. Flu-like symptoms   2. Sore throat   3. Cough   4. Herpes simplex  5. Pharyngitis, unspecified etiology    P: 1. Flu-like symptoms - POCT Influenza A/B Results for orders placed or performed in visit on 07/05/18 (from the past 24 hour(s))  POCT Influenza A/B     Status: Normal   Collection Time: 07/05/18 12:27 PM  Result Value Ref Range   Influenza A, POC Negative Negative   Influenza B, POC Negative Negative  POCT rapid strep A     Status: Normal   Collection Time: 07/05/18 12:27 PM  Result Value Ref Range   Rapid Strep A Screen Negative Negative   2. Sore throat - POCT rapid strep A-  negative  4. Herpes simplex Patient reports recent shingles outbreak in the past 7-10 days she reports using Valtrex and this resolved those symptoms- she reports that she already has some tablets left over and the dosing for oral cold sore discussed and written instructions provided to patient. 2000 mg AM and 2000 mg in the PM separated by 12 hours.  5. Pharyngitis, unspecified etiology 6 days of symptoms in presence of other viral illness-could be viral but patient with +2 tonsils, erythema, exudate, hx of fever and worsening symptoms. Will treat today and advised supportive care with abx. - amoxicillin (AMOXIL) 500 MG capsule; Take 1 capsule (500 mg total) by mouth 2 (two) times daily for 7 days.  Other orders - Diclofenac Sodium (PENNSAID) 2 % SOLN; Place onto the skin.   Discussed with patient exam findings, suspected diagnosis etiology and  reviewed recommended treatment plan and follow up, including complications and indications for urgent medical follow up and evaluation. Medications including use and indications reviewed with patient. Patient provided relevant patient education on diagnosis and/or relevant related condition that were discussed and reviewed with patient at discharge. Patient verbalized understanding of information provided and agrees with plan of care (POC), all questions answered.

## 2018-07-05 NOTE — Patient Instructions (Signed)
Pharyngitis  Pharyngitis is redness, pain, and swelling (inflammation) of the throat (pharynx). It is a very common cause of sore throat. Pharyngitis can be caused by a bacteria, but it is usually caused by a virus. Most cases of pharyngitis get better on their own without treatment. What are the causes? This condition may be caused by:  Infection by viruses (viral). Viral pharyngitis spreads from person to person (is contagious) through coughing, sneezing, and sharing of personal items or utensils such as cups, forks, spoons, and toothbrushes.  Infection by bacteria (bacterial). Bacterial pharyngitis may be spread by touching the nose or face after coming in contact with the bacteria, or through more intimate contact, such as kissing.  Allergies. Allergies can cause buildup of mucus in the throat (post-nasal drip), leading to inflammation and irritation. Allergies can also cause blocked nasal passages, forcing breathing through the mouth, which dries and irritates the throat. What increases the risk? You are more likely to develop this condition if:  You are 5-24 years old.  You are exposed to crowded environments such as daycare, school, or dormitory living.  You live in a cold climate.  You have a weakened disease-fighting (immune) system. What are the signs or symptoms? Symptoms of this condition vary by the cause (viral, bacterial, or allergies) and can include:  Sore throat.  Fatigue.  Low-grade fever.  Headache.  Joint pain and muscle aches.  Skin rashes.  Swollen glands in the throat (lymph nodes).  Plaque-like film on the throat or tonsils. This is often a symptom of bacterial pharyngitis.  Vomiting.  Stuffy nose (nasal congestion).  Cough.  Red, itchy eyes (conjunctivitis).  Loss of appetite. How is this diagnosed? This condition is often diagnosed based on your medical history and a physical exam. Your health care provider will ask you questions about your  illness and your symptoms. A swab of your throat may be done to check for bacteria (rapid strep test). Other lab tests may also be done, depending on the suspected cause, but these are rare. How is this treated? This condition usually gets better in 3-4 days without medicine. Bacterial pharyngitis may be treated with antibiotic medicines. Follow these instructions at home:  Take over-the-counter and prescription medicines only as told by your health care provider. ? If you were prescribed an antibiotic medicine, take it as told by your health care provider. Do not stop taking the antibiotic even if you start to feel better. ? Do not give children aspirin because of the association with Reye syndrome.  Drink enough water and fluids to keep your urine clear or pale yellow.  Get a lot of rest.  Gargle with a salt-water mixture 3-4 times a day or as needed. To make a salt-water mixture, completely dissolve -1 tsp of salt in 1 cup of warm water.  If your health care provider approves, you may use throat lozenges or sprays to soothe your throat. Contact a health care provider if:  You have large, tender lumps in your neck.  You have a rash.  You cough up green, yellow-brown, or bloody spit. Get help right away if:  Your neck becomes stiff.  You drool or are unable to swallow liquids.  You cannot drink or take medicines without vomiting.  You have severe pain that does not go away, even after you take medicine.  You have trouble breathing, and it is not caused by a stuffy nose.  You have new pain and swelling in your joints such as   the knees, ankles, wrists, or elbows. Summary  Pharyngitis is redness, pain, and swelling (inflammation) of the throat (pharynx).  While pharyngitis can be caused by a bacteria, the most common causes are viral.  Most cases of pharyngitis get better on their own without treatment.  Bacterial pharyngitis is treated with antibiotic medicines. This  information is not intended to replace advice given to you by your health care provider. Make sure you discuss any questions you have with your health care provider. Document Released: 05/28/2005 Document Revised: 07/03/2016 Document Reviewed: 07/03/2016 Elsevier Interactive Patient Education  2019 [employer] Cold Sore  A cold sore, also called a fever blister, is a small, fluid-filled sore that forms inside of the mouth or on the lips, gums, nose, chin, or cheeks. Cold sores can spread to other parts of the body, such as the eyes or fingers. Cold sores can spread from person to person (are contagious) until the sores crust over completely. Most cold sores go away within 2 weeks. What are the causes? Cold sores are caused by a virus (herpes simplex virus type 1, HSV-1). The virus can spread from person to person through close contact, such as through:  Kissing.  Touching the affected area.  Sharing personal items such as lip balm, razors, a drinking glass, or eating utensils. What increases the risk? You are more likely to develop this condition if you:  Are tired, stressed, or sick.  Are having your period (menstruating).  Are pregnant.  Take certain medicines.  Are out in cold weather or get too much sun. What are the signs or symptoms? Symptoms of a cold sore outbreak go through different stages. These are the stages of a cold sore:  Tingling, itching, or burning is felt 1-2 days before the outbreak.  Fluid-filled blisters appear on the lips, inside the mouth, on the nose, or on the cheeks.  The blisters start to ooze clear fluid.  The blisters dry up, and a yellow crust appears in their place.  The crust falls off. In some cases, other symptoms can develop during a cold sore outbreak. These can include:  Fever.  Sore throat.  Headache.  Muscle aches.  Swollen neck glands. How is this treated? There is no cure for cold sores or the virus that causes them.  There is also no vaccine to prevent the virus. Most cold sores go away on their own without treatment within 2 weeks. Your doctor may prescribe medicines to:  Help with pain.  Keep the virus from growing.  Help you heal faster. Medicines may be in the form of creams, gels, pills, or a shot. Follow these instructions at home: Medicines  Take or apply over-the-counter and prescription medicines only as told by your doctor.  Use a cotton-tip swab to apply creams or gels to your sores.  Ask your doctor if you can take lysine supplements. These may help with healing. Sore care   Do not touch the sores or pick the scabs.  Wash your hands often. Do not touch your eyes without washing your hands first.  Keep the sores clean and dry.  If told, put ice on the sores: ? Put ice in a plastic bag. ? Place a towel between your skin and the bag. ? Leave the ice on for 20 minutes, 2-3 times a day. Eating and drinking  Eat a soft, bland diet. Avoid eating hot, cold, or salty foods. These can hurt your mouth.  Use a straw if it hurts to  drink out of a glass.  Eat foods that have a lot of lysine in them. These include meat, fish, and dairy products.  Avoid sugary foods, chocolates, nuts, and grains. These foods have a high amount of a substance (arginine) that can cause the virus to grow. Lifestyle  Do not kiss, have oral sex, or share personal items until your sores heal.  Stress, poor sleep, and being out in the sun can trigger outbreaks. Make sure you: ? Do activities that help you relax, such as deep breathing exercises or meditation. ? Get enough sleep. ? Apply sunscreen on your lips before you go out in the sun. Contact a doctor if:  You have symptoms for more than 2 weeks.  You have pus coming from the sores.  You have redness that is spreading.  You have pain or irritation in your eye.  You get sores on your genitals.  Your sores do not heal within 2 weeks.  You get  cold sores often. Get help right away if:  You have a fever and your symptoms suddenly get worse.  You have a headache and confusion.  You have tiredness (fatigue).  You do not want to eat as much as normal (loss of appetite).  You have a stiff neck or are sensitive to light. Summary  A cold sore is a small, fluid-filled sore that forms inside of the mouth or on the lips, gums, nose, chin, or cheeks.  Cold sores can spread from person to person (are contagious) until the sores crust over completely. Most cold sores go away within 2 weeks.  Wash your hands often. Do not touch your eyes without washing your hands first.  Do not kiss, have oral sex, or share personal items until your sores heal.  Contact a doctor if your sores do not heal within 2 weeks. This information is not intended to replace advice given to you by your health care provider. Make sure you discuss any questions you have with your health care provider. Document Released: 11/27/2011 Document Revised: 10/28/2017 Document Reviewed: 10/28/2017 Elsevier Interactive Patient Education  2019 [employer].

## 2018-07-09 ENCOUNTER — Encounter: Payer: Self-pay | Admitting: Nurse Practitioner

## 2018-07-09 ENCOUNTER — Other Ambulatory Visit (HOSPITAL_COMMUNITY)
Admission: RE | Admit: 2018-07-09 | Discharge: 2018-07-09 | Disposition: A | Discharge status: Home / Self Care | Payer: 59 | Source: Ambulatory Visit | Attending: Nurse Practitioner | Admitting: Nurse Practitioner

## 2018-07-09 ENCOUNTER — Ambulatory Visit: Payer: 59 | Admitting: Nurse Practitioner

## 2018-07-09 VITALS — BP 122/80 | HR 89 | Temp 98.2°F | Ht 70.0 in | Wt 233.0 lb

## 2018-07-09 DIAGNOSIS — N898 Other specified noninflammatory disorders of vagina: Secondary | ICD-10-CM | POA: Diagnosis not present

## 2018-07-09 DIAGNOSIS — R102 Pelvic and perineal pain: Secondary | ICD-10-CM | POA: Diagnosis not present

## 2018-07-09 DIAGNOSIS — Z113 Encounter for screening for infections with a predominantly sexual mode of transmission: Secondary | ICD-10-CM

## 2018-07-09 LAB — POCT URINALYSIS DIPSTICK
Bilirubin, UA: NEGATIVE
Blood, UA: NEGATIVE
Glucose, UA: NEGATIVE
Ketones, UA: NEGATIVE
Nitrite, UA: NEGATIVE
PH UA: 5.5 (ref 5.0–8.0)
PROTEIN UA: NEGATIVE
Spec Grav, UA: >=1.03 — AB (ref 1.010–1.025)
UROBILINOGEN UA: 0.2 U/dL

## 2018-07-09 NOTE — Patient Instructions (Signed)
Go to lab for blood draw.  You will be contacted with lab results.

## 2018-07-09 NOTE — Progress Notes (Signed)
Subjective:  Patient ID: [name], female    DOB: Jun 19, 1985  Age: 33 y.o. MRN: [id number]  CC: Vaginal Discharge (pt is complaining of vaginal pain,discharge,denied odor/ going on 7 days/ pt stop lexapro and birth control, since 06/14/2018--trying to have baby. pt is on anb right now for laryngitis. )  Vaginal Discharge  The patient's primary symptoms include vaginal discharge. This is a new problem. The current episode started in the past 7 days. The problem occurs constantly. The problem has been unchanged. She is not pregnant. Associated symptoms include abdominal pain. Pertinent negatives include no anorexia, back pain, chills, constipation, diarrhea, discolored urine, dysuria, fever, flank pain, frequency, headaches, hematuria, joint pain, joint swelling, nausea, painful intercourse, rash, sore throat, urgency or vomiting. The vaginal discharge was watery. The vaginal bleeding is typical of menses. Nothing aggravates the symptoms. She has tried nothing for the symptoms. She is sexually active. No, her partner does not have an STD. She uses nothing for contraception. Her menstrual history has been regular. There is no history of PID, an STD or vaginosis.  urine pregnancy at home yesterday: negative. LMP 06/20/2018.  Reviewed past Medical, Social and Family history today.  Outpatient Medications Prior to Visit  Medication Sig Dispense Refill  . amoxicillin (AMOXIL) 500 MG capsule Take 1 capsule (500 mg total) by mouth 2 (two) times daily for 7 days. 14 capsule 0  . Diclofenac Sodium (PENNSAID) 2 % SOLN Place onto the skin.    . Prenatal Vit-Fe Fumarate-FA (PRENATAL MULTIVITAMIN) TABS tablet Take 1 tablet by mouth daily at 12 noon.     No facility-administered medications prior to visit.     ROS See HPI  Objective:  BP 122/80   Pulse 89   Temp 98.2 F (36.8 C) (Oral)   Ht 5\' 10"  (1.778 m)   Wt 233 lb (105.7 kg)   SpO2 98%   BMI 33.43 kg/m   BP Readings from Last 3  Encounters:  07/09/18 122/80  07/05/18 100/70  07/02/18 124/84    Wt Readings from Last 3 Encounters:  07/09/18 233 lb (105.7 kg)  07/02/18 232 lb (105.2 kg)  06/20/18 233 lb 4 oz (105.8 kg)    Physical Exam Vitals signs reviewed. Exam conducted with a chaperone present.  Genitourinary:    Labia:        Right: Tenderness present. No rash, lesion or injury.        Left: Tenderness present. No rash, lesion or injury.      Urethra: Prolapse present. No urethral pain, urethral swelling or urethral lesion.     Vagina: Vaginal discharge present. No erythema or bleeding.     Cervix: Discharge present. No cervical motion tenderness, friability, lesion or erythema.     Adnexa: Right adnexa normal and left adnexa normal.  Lymphadenopathy:     Lower Body: No right inguinal adenopathy. No left inguinal adenopathy.  Neurological:     Mental Status: She is alert.     Lab Results  Component Value Date   WBC 6.7 10/28/2017   HGB 14.2 10/28/2017   HCT 41.7 10/28/2017   PLT 290.0 10/28/2017   GLUCOSE 101 (H) 10/28/2017   CHOL 180 08/30/2016   TRIG 112.0 08/30/2016   HDL 62.00 08/30/2016   LDLCALC 96 08/30/2016   ALT 25 10/28/2017   AST 15 10/28/2017   NA 138 10/28/2017   K 3.8 10/28/2017   CL 104 10/28/2017   CREATININE 0.78 10/28/2017   BUN 13 10/28/2017  CO2 24 10/28/2017   TSH 1.69 08/30/2016   HGBA1C 5.9 08/30/2016    Dg Wrist Complete Right  Result Date: 05/20/2018 CLINICAL DATA:  Right wrist pain laterally, no known injury, initial encounter EXAM: RIGHT WRIST - COMPLETE 3+ VIEW COMPARISON:  None. FINDINGS: Mild widening of the scapholunate space is noted. This may be somewhat projectional in nature. No acute fracture or dislocation is seen. No soft tissue abnormality is noted. IMPRESSION: Questionable widening of the scapholunate space. This may be projectional in nature. No other focal abnormality is noted. Electronically Signed   By: Inez Catalina M.D.   On: 05/20/2018  15:26   [place] Limited Joint Space Structures Up Right  Result Date: 05/23/2018 Limited musculoskeletal ultrasound was performed and interpreted by Lyndal Pulley Limited ultrasound of patient's wrist shows a mild tenosynovitis. Patient does have some anatomical variance around the radial bone. Difficult to assess on the ultrasound. No true cortical defect noted.   Assessment & Plan:   Marra was seen today for vaginal discharge.  Diagnoses and all orders for this visit:  Vaginal discharge -     Cervicovaginal ancillary only( [place])  Screening examination for STD (sexually transmitted disease) -     Cervicovaginal ancillary only( [place]) -     HIV Antibody (routine testing w rflx) -     RPR  Suprapubic abdominal pain -     Urine Culture -     POCT urinalysis dipstick   I am having Merita V. Buckingham maintain her prenatal multivitamin, Diclofenac Sodium, and amoxicillin.  No orders of the defined types were placed in this encounter.   Problem List Items Addressed This Visit    None    Visit Diagnoses    Vaginal discharge    -  Primary   Relevant Orders   Cervicovaginal ancillary only( [place])   Screening examination for STD (sexually transmitted disease)       Relevant Orders   Cervicovaginal ancillary only( [place])   HIV Antibody (routine testing w rflx)   RPR   Suprapubic abdominal pain       Relevant Orders   Urine Culture   POCT urinalysis dipstick (Completed)       Follow-up: Return if symptoms worsen or fail to improve.  Wilfred Lacy, NP

## 2018-07-11 ENCOUNTER — Encounter: Payer: Self-pay | Admitting: Nurse Practitioner

## 2018-07-11 ENCOUNTER — Other Ambulatory Visit: Payer: Self-pay | Admitting: Nurse Practitioner

## 2018-07-11 DIAGNOSIS — N3000 Acute cystitis without hematuria: Secondary | ICD-10-CM

## 2018-07-11 LAB — CERVICOVAGINAL ANCILLARY ONLY
BACTERIAL VAGINITIS: NEGATIVE
CANDIDA VAGINITIS: NEGATIVE
Chlamydia: NEGATIVE
HPV: NOT DETECTED
Neisseria Gonorrhea: NEGATIVE
Trichomonas: NEGATIVE

## 2018-07-11 LAB — URINE CULTURE
MICRO NUMBER:: 121899
SPECIMEN QUALITY:: ADEQUATE

## 2018-07-11 LAB — HIV ANTIBODY (ROUTINE TESTING W REFLEX): HIV 1&2 Ab, 4th Generation: NONREACTIVE

## 2018-07-11 LAB — RPR: RPR: NONREACTIVE

## 2018-07-11 MED ORDER — AMOXICILLIN-POT CLAVULANATE 875-125 MG PO TABS: 1.0000 | ORAL_TABLET | Freq: Two times a day (BID) | ORAL | 0 refills | Status: DC

## 2018-07-11 NOTE — Telephone Encounter (Signed)
Please advise to stop amoxicillin and start augmentin. This will cover pharyngitis and UTI.

## 2018-07-12 ENCOUNTER — Telehealth: Payer: Self-pay | Admitting: Family Medicine

## 2018-07-12 DIAGNOSIS — N3000 Acute cystitis without hematuria: Secondary | ICD-10-CM

## 2018-07-12 MED ORDER — AMOXICILLIN-POT CLAVULANATE 875-125 MG PO TABS: 1.0000 | ORAL_TABLET | Freq: Two times a day (BID) | ORAL | 0 refills | Status: DC

## 2018-07-12 NOTE — Telephone Encounter (Signed)
Patient was not able to pick up her AUGMENTIN yesterday from the pharmacy and they are closed on the weekend, she would like it resent to [facility] on [address] in [place], Please advise

## 2018-07-12 NOTE — Telephone Encounter (Signed)
Resent to [facility] with approval of Dr Jerline Pain.

## 2018-07-14 LAB — CERVICOVAGINAL ANCILLARY ONLY: Herpes: NEGATIVE

## 2018-07-28 ENCOUNTER — Other Ambulatory Visit: Payer: Self-pay | Admitting: Family Medicine

## 2018-08-15 ENCOUNTER — Ambulatory Visit: Payer: 59 | Admitting: Primary Care

## 2018-08-15 ENCOUNTER — Ambulatory Visit (INDEPENDENT_AMBULATORY_CARE_PROVIDER_SITE_OTHER)
Admission: RE | Admit: 2018-08-15 | Discharge: 2018-08-15 | Disposition: A | Discharge status: Home / Self Care | Payer: 59 | Source: Ambulatory Visit | Attending: Primary Care | Admitting: Primary Care

## 2018-08-15 ENCOUNTER — Encounter: Payer: Self-pay | Admitting: Primary Care

## 2018-08-15 VITALS — BP 120/82 | HR 83 | Temp 98.2°F | Ht 70.0 in | Wt 233.2 lb

## 2018-08-15 DIAGNOSIS — F329 Major depressive disorder, single episode, unspecified: Secondary | ICD-10-CM

## 2018-08-15 DIAGNOSIS — R079 Chest pain, unspecified: Secondary | ICD-10-CM | POA: Diagnosis not present

## 2018-08-15 DIAGNOSIS — Z803 Family history of malignant neoplasm of breast: Secondary | ICD-10-CM

## 2018-08-15 DIAGNOSIS — F419 Anxiety disorder, unspecified: Secondary | ICD-10-CM

## 2018-08-15 DIAGNOSIS — R0789 Other chest pain: Secondary | ICD-10-CM

## 2018-08-15 LAB — TSH: TSH: 1.1 u[IU]/mL (ref 0.35–4.50)

## 2018-08-15 NOTE — Assessment & Plan Note (Signed)
Endorses doing well on Lexapro, is under some financial stress and family stress. Anxiety could be etiology for chest pain, keep on differential list. Continue Lexapro.

## 2018-08-15 NOTE — Progress Notes (Signed)
Subjective:    Patient ID: Autumn [name], female    DOB: [birth year], 33 y.o.   MRN: [id number]  HPI  [name] is a 33 year old female with a history of chest pain, epigastric pain, anxiety and depression, IBS, shingles, pneumonitis who presents today with a chief complaint of chest pain. She has a family history of breast cancer in mother.   Her pain is located proximal to the right breast with radiation through to her back/shoulder blade. She describes her pain as dull, achy. She took a few Tums and Nexium several days ago thinking she may have had GERD, no improvement. Symptoms began 2-3 weeks ago, worse with activity. Symptoms do not wake her up at night, does not wake up with the symptoms but will gradually notice the pain as her day progresses, pain does not dissipate with rest. Also with some shortness of breath, sometimes with deep inspiration. She did resume her OCP's three weeks ago.  She denies abdominal pain, esophageal burning, nausea, increased IBS symptoms, heavy lifting/exercise, cough. She is under a lot of stress, doesn't feel as though she's more stressed than usual, does feel that Lexapro is helping. She did start her OCP's three weeks ago.   She had a mammogram in May 2018 which was clear. Mother originally diagnosed with breast cancer at age 46 and underwent lumpectomy. Yesterday her mother was found to have another spot and tested positive again for breast cancer, her mother is now 31.   Review of Systems  Constitutional: Negative for fever.  Respiratory: Positive for shortness of breath.   Cardiovascular: Positive for chest pain. Negative for palpitations and leg swelling.  Gastrointestinal: Negative for abdominal pain and nausea.  Skin: Negative for color change.  Neurological: Negative for dizziness and headaches.  Psychiatric/Behavioral:       Intermittent anxiety        Past Medical History:  Diagnosis Date  . Anxiety   . Back pain    chronic  .  Depression   . Disc disease, degenerative, lumbar or lumbosacral   . History of ovarian cyst   . Migraine   . Scoliosis      Social History   Socioeconomic History  . Marital status: Married    Spouse name: Not on file  . Number of children: 1  . Years of education: Not on file  . Highest education level: Not on file  Occupational History  . Occupation: CMA  Social Needs  . Financial resource strain: Not on file  . Food insecurity:    Worry: Not on file    Inability: Not on file  . Transportation needs:    Medical: Not on file    Non-medical: Not on file  Tobacco Use  . Smoking status: Former Smoker    Types: Cigarettes    Last attempt to quit: 08/18/2016    Years since quitting: 1.9  . Smokeless tobacco: Never Used  . Tobacco comment: 3 cigarettes a day  Substance and Sexual Activity  . Alcohol use: Yes    Alcohol/week: 0.0 standard drinks    Comment: occasional  . Drug use: No  . Sexual activity: Yes    Birth control/protection: Pill  Lifestyle  . Physical activity:    Days per week: Not on file    Minutes per session: Not on file  . Stress: Not on file  Relationships  . Social connections:    Talks on phone: Not on file    Gets  together: Not on file    Attends religious service: Not on file    Active member of club or organization: Not on file    Attends meetings of clubs or organizations: Not on file    Relationship status: Not on file  . Intimate partner violence:    Fear of current or ex partner: Not on file    Emotionally abused: Not on file    Physically abused: Not on file    Forced sexual activity: Not on file  Other Topics Concern  . Not on file  Social History Narrative  . Not on file    Past Surgical History:  Procedure Laterality Date  . APPENDECTOMY    . BREAST SURGERY Right 2008   lump/ benign    Family History  Problem Relation Age of Onset  . Cancer Mother 57       breast  . Alcohol abuse Father   . Vision loss Maternal  Grandmother   . Heart disease Maternal Grandmother   . Heart failure Maternal Grandmother   . Cancer Maternal Grandfather   . Alcohol abuse Paternal Grandfather   . Arthritis Maternal Aunt     No Known Allergies  Current Outpatient Medications on File Prior to Visit  Medication Sig Dispense Refill  . ALAYCEN 1/35 tablet TAKE 1 TABLET BY MOUTH DAILY 84 tablet 1  . Diclofenac Sodium (PENNSAID) 2 % SOLN Place onto the skin.    Marland Kitchen escitalopram (LEXAPRO) 20 MG tablet Take 20 mg by mouth daily.    . Prenatal Vit-Fe Fumarate-FA (PRENATAL MULTIVITAMIN) TABS tablet Take 1 tablet by mouth daily at 12 noon.     No current facility-administered medications on file prior to visit.     BP 120/82   Pulse 83   Temp 98.2 F (36.8 C) (Oral)   Ht 5\' 10"  (1.778 m)   Wt 233 lb 4 oz (105.8 kg)   LMP 07/13/2018 (Within Days)   SpO2 98%   BMI 33.47 kg/m    Objective:   Physical Exam  Constitutional: She appears well-nourished.  Neck: Neck supple.  Cardiovascular: Normal rate and regular rhythm.  Respiratory: Effort normal and breath sounds normal. No breast swelling, tenderness or discharge.  Genitourinary:    Genitourinary Comments: Densities to bilateral breasts. No lumps/masses. Non tender. Skin texture appears normal.   Skin: Skin is warm and dry.  Psychiatric: She has a normal mood and affect.           Assessment & Plan:

## 2018-08-15 NOTE — Assessment & Plan Note (Signed)
Present for the last 2-3 weeks, doesn't sound cardiac in nature. Did resume OCP's at the time of chest pain starting.  Differentials include PE, anxiety, MSK, breast involvement. ECG today with NSR, rate of 70, no ST elevation/depression, T-wave inversion, PAC/PVC.  Check TSH, D-dimer, chest xray today. Given family history of breast cancer and now the return of her mother's cancer we will check mammogram and ultrasound.   ED precautions provided.

## 2018-08-15 NOTE — Patient Instructions (Addendum)
Stop by the lab and xray prior to leaving today. I will notify you of your results once received.   Stop by the front desk and speak with Shirlean Mylar regarding your mammogram and ultrasound.  Please go to the hospital if you experience severe chest pain with deep inspiration, chest pain gets worse.  It was a pleasure to see you today!

## 2018-08-15 NOTE — Assessment & Plan Note (Signed)
Diagnostic mammogram and ultrasound pending.

## 2018-08-16 ENCOUNTER — Emergency Department
Admission: EM | Admit: 2018-08-16 | Discharge: 2018-08-16 | Disposition: A | Discharge status: Home / Self Care | Payer: 59 | Attending: Emergency Medicine | Admitting: Emergency Medicine

## 2018-08-16 ENCOUNTER — Encounter: Payer: Self-pay | Admitting: Emergency Medicine

## 2018-08-16 ENCOUNTER — Other Ambulatory Visit: Payer: Self-pay | Admitting: Primary Care

## 2018-08-16 ENCOUNTER — Emergency Department: Payer: 59

## 2018-08-16 DIAGNOSIS — R0789 Other chest pain: Secondary | ICD-10-CM

## 2018-08-16 DIAGNOSIS — R079 Chest pain, unspecified: Secondary | ICD-10-CM

## 2018-08-16 DIAGNOSIS — Z87891 Personal history of nicotine dependence: Secondary | ICD-10-CM | POA: Diagnosis not present

## 2018-08-16 DIAGNOSIS — Z79899 Other long term (current) drug therapy: Secondary | ICD-10-CM | POA: Diagnosis not present

## 2018-08-16 DIAGNOSIS — R9431 Abnormal electrocardiogram [ECG] [EKG]: Secondary | ICD-10-CM | POA: Diagnosis not present

## 2018-08-16 LAB — CBC
HCT: 40.8 % (ref 36.0–46.0)
Hemoglobin: 14 g/dL (ref 12.0–15.0)
MCH: 30.3 pg (ref 26.0–34.0)
MCHC: 34.3 g/dL (ref 30.0–36.0)
MCV: 88.3 fL (ref 80.0–100.0)
NRBC: 0 % (ref 0.0–0.2)
PLATELETS: 314 10*3/uL (ref 150–400)
RBC: 4.62 MIL/uL (ref 3.87–5.11)
RDW: 12.3 % (ref 11.5–15.5)
WBC: 8 10*3/uL (ref 4.0–10.5)

## 2018-08-16 LAB — BASIC METABOLIC PANEL
Anion gap: 11 (ref 5–15)
BUN: 13 mg/dL (ref 6–20)
CO2: 21 mmol/L — ABNORMAL LOW (ref 22–32)
Calcium: 9.4 mg/dL (ref 8.9–10.3)
Chloride: 109 mmol/L (ref 98–111)
Creatinine, Ser: 0.65 mg/dL (ref 0.44–1.00)
GFR calc Af Amer: >60 mL/min (ref >60)
GFR calc non Af Amer: >60 mL/min (ref >60)
Glucose, Bld: 93 mg/dL (ref 70–99)
Potassium: 3.8 mmol/L (ref 3.5–5.1)
SODIUM: 141 mmol/L (ref 135–145)

## 2018-08-16 LAB — TROPONIN I: Troponin I: <0.03 ng/mL (ref <0.03)

## 2018-08-16 LAB — FIBRIN DERIVATIVES D-DIMER (ARMC ONLY): Fibrin derivatives D-dimer (ARMC): 1071.94 ng/mL (FEU) — ABNORMAL HIGH (ref 0.00–499.00)

## 2018-08-16 LAB — D-DIMER, QUANTITATIVE: D-Dimer, Quant: 1 mcg/mL FEU — ABNORMAL HIGH (ref <0.50)

## 2018-08-16 LAB — POCT PREGNANCY, URINE: PREG TEST UR: NEGATIVE

## 2018-08-16 MED ORDER — SODIUM CHLORIDE 0.9% FLUSH: 3.0000 mL | Freq: Once | INTRAVENOUS | Status: DC

## 2018-08-16 MED ORDER — IOHEXOL 350 MG/ML SOLN
75.0000 mL | Freq: Once | INTRAVENOUS | Status: AC | PRN
  Administered 2018-08-16: 75 mL via INTRAVENOUS

## 2018-08-16 NOTE — ED Triage Notes (Signed)
Pt to ED per PCP referral due to BNP of 1.00. Pt states PCP has concerns for blood clot. Pt recently started taking birth control approx 3 wks ago.

## 2018-08-16 NOTE — Discharge Instructions (Addendum)
As we discussed your CT scan did not show any blood clots but did show a pulmonary nodule (collection of tissue). Please discuss this with your primary care doctor, you may benefit from repeat imaging in a year. Please seek medical attention for any high fevers, chest pain, shortness of breath, change in behavior, persistent vomiting, bloody stool or any other new or concerning symptoms.

## 2018-08-16 NOTE — ED Provider Notes (Signed)
[facility] Emergency Department Provider Note   ____________________________________________   I have reviewed the triage vital signs and the nursing notes.   HISTORY  Chief Complaint Here for a CT scan  History limited by: Not Limited   HPI Autumn [name] is a 33 y.o. female who presents to the emergency department today to obtain a CT scan of the chest because of concerns for possible blood clot.  Patient states for the past couple of weeks she has been having right-sided chest pain.  It has been fairly constant.  She states it is not severe and she has been able to sleep through it.  It is sometimes worse with exertion.  Went to her primary care doctor's office and had blood work done which showed an elevated d-dimer.  She did recently start birth control.  Per medical record review patient has a history of PCP visit yesterday.  Past Medical History:  Diagnosis Date  . Anxiety   . Back pain    chronic  . Depression   . Disc disease, degenerative, lumbar or lumbosacral   . History of ovarian cyst   . Migraine   . Scoliosis     Patient Active Problem List   Diagnosis Date Noted  . Acute bursitis of left shoulder 07/02/2018  . Shingles 06/20/2018  . Wrist pain, acute, right 05/20/2018  . Neck pain 04/16/2018  . Nonallopathic lesion of cervical region 04/16/2018  . Nonallopathic lesion of rib cage 04/16/2018  . Nonallopathic lesion of thoracic region 04/16/2018  . Right lower quadrant abdominal pain 10/28/2017  . Chest pain 09/02/2017  . Epigastric discomfort 09/02/2017  . Contraception management 08/30/2016  . Family history of breast cancer in mother 08/30/2016  . Obesity (BMI 30.0-34.9) 08/19/2014  . Amenorrhea 12/25/2013  . Anxiety and depression 07/15/2012  . Screening for STD (sexually transmitted disease) 01/10/2012    Past Surgical History:  Procedure Laterality Date  . APPENDECTOMY    . BREAST SURGERY Right 2008   lump/  benign    Prior to Admission medications   Medication Sig Start Date End Date Taking? Authorizing Provider  ALAYCEN 1/35 tablet TAKE 1 TABLET BY MOUTH DAILY 07/28/18   Lucille Passy, MD  Diclofenac Sodium (PENNSAID) 2 % SOLN Place onto the skin.    [provider]  escitalopram (LEXAPRO) 20 MG tablet Take 20 mg by mouth daily.    [provider]  Prenatal Vit-Fe Fumarate-FA (PRENATAL MULTIVITAMIN) TABS tablet Take 1 tablet by mouth daily at 12 noon.    [provider]    Allergies Patient has no known allergies.  Family History  Problem Relation Age of Onset  . Cancer Mother 58       breast  . Alcohol abuse Father   . Vision loss Maternal Grandmother   . Heart disease Maternal Grandmother   . Heart failure Maternal Grandmother   . Cancer Maternal Grandfather   . Alcohol abuse Paternal Grandfather   . Arthritis Maternal Aunt     Social History Social History   Tobacco Use  . Smoking status: Former Smoker    Types: Cigarettes    Last attempt to quit: 08/18/2016    Years since quitting: 1.9  . Smokeless tobacco: Never Used  . Tobacco comment: 3 cigarettes a day  Substance Use Topics  . Alcohol use: Yes    Alcohol/week: 0.0 standard drinks    Comment: occasional  . Drug use: No    Review of Systems  Constitutional: No fever/chills Eyes: No visual changes. ENT: No sore throat. Cardiovascular: Denies chest pain. Respiratory: Denies shortness of breath. Gastrointestinal: No abdominal pain.  No nausea, no vomiting.  No diarrhea.   Genitourinary: Negative for dysuria. Musculoskeletal: Negative for back pain. Skin: Negative for rash. Neurological: Negative for headaches, focal weakness or numbness.  ____________________________________________   PHYSICAL EXAM:  VITAL SIGNS: ED Triage Vitals [08/16/18 1539]  Enc Vitals Group     BP 124/83     Pulse Rate 77     Resp 16     Temp (!) 97.4 F (36.3 C)     Temp src      SpO2 97 %      Weight      Height      Head Circumference      Peak Flow      Pain Score 5   Constitutional: Alert and oriented.  Eyes: Conjunctivae are normal.  ENT      Head: Normocephalic and atraumatic.      Nose: No congestion/rhinnorhea.      Mouth/Throat: Mucous membranes are moist.      Neck: No stridor. Hematological/Lymphatic/Immunilogical: No cervical lymphadenopathy. Cardiovascular: Normal rate, regular rhythm.  No murmurs, rubs, or gallops.  Respiratory: Normal respiratory effort without tachypnea nor retractions. Breath sounds are clear and equal bilaterally. No wheezes/rales/rhonchi. Gastrointestinal: Soft and non tender. No rebound. No guarding.  Genitourinary: Deferred Musculoskeletal: Normal range of motion in all extremities. No lower extremity edema. Neurologic:  Normal speech and language. No gross focal neurologic deficits are appreciated.  Skin:  Skin is warm, dry and intact. No rash noted. Psychiatric: Mood and affect are normal. Speech and behavior are normal. Patient exhibits appropriate insight and judgment.  ____________________________________________    LABS (pertinent positives/negatives)  Upreg negative D-dimer 1071.94 CBC wbc 8.0, hgb 14.0, plt 314 Trop <0.03 BMP wnl except co2 21  ____________________________________________   EKG  I, Nance Pear, attending physician, personally viewed and interpreted this EKG  EKG Time: 1533 Rate: 76 Rhythm: normal sinus rhythm Axis: normal Intervals: qtc 414 QRS: narrow ST changes: no st elevation, t wave inversion v1 Impression: abnormal ekg  ____________________________________________    RADIOLOGY  CT angio Pulmonary emboli. No acute abnormality.   ____________________________________________   PROCEDURES  Procedures  ____________________________________________   INITIAL IMPRESSION / ASSESSMENT AND PLAN / ED COURSE  Pertinent labs & imaging results that were available during my care of  the patient were reviewed by me and considered in my medical decision making (see chart for details).   Patient presented to the emergency department today because of concerns for possible pulmonary embolism.  Patient been having right-sided chest pain had a positive d-dimer primary care doctor's office.  CT EEG brain did not show pulmonary embolism but did show pulmonary nodule.  I discussed this with the patient.  Discussed that she should follow-up with primary care.  ____________________________________________   FINAL CLINICAL IMPRESSION(S) / ED DIAGNOSES  Final diagnoses:  Atypical chest pain     Note: This dictation was prepared with Dragon dictation. Any transcriptional errors that result from this process are unintentional     Nance Pear, MD 08/16/18 2109

## 2018-08-20 ENCOUNTER — Other Ambulatory Visit: Payer: Self-pay

## 2018-08-20 ENCOUNTER — Ambulatory Visit: Payer: 59 | Admitting: Family Medicine

## 2018-08-20 ENCOUNTER — Encounter: Payer: Self-pay | Admitting: Family Medicine

## 2018-08-20 VITALS — BP 118/86 | HR 86 | Ht 70.0 in | Wt 236.0 lb

## 2018-08-20 DIAGNOSIS — M7552 Bursitis of left shoulder: Secondary | ICD-10-CM | POA: Diagnosis not present

## 2018-08-20 DIAGNOSIS — M999 Biomechanical lesion, unspecified: Secondary | ICD-10-CM

## 2018-08-20 DIAGNOSIS — M542 Cervicalgia: Secondary | ICD-10-CM | POA: Diagnosis not present

## 2018-08-20 NOTE — Assessment & Plan Note (Signed)
Postural as well as likely psychosomatic.  Discussed with patient again in great length about posture and ergonomics.  Patient is following up with primary care provider for recent chest pain.  Patient had a work-up in the emergency department that was ruling out any type of pulmonary embolism.  EKG fairly unremarkable as well.  Do have some underlying anxiety that could be contributing.  Discussed with patient in great length patient does feel like she is responding well to manipulation follow-up again in 6 to 8 weeks

## 2018-08-20 NOTE — Progress Notes (Signed)
[facility] [address], [place] [address] Phone: [phone] Subjective:   Autumn [name], am serving as a scribe for Dr. Hulan Saas.  I'm seeing this patient by the request  of:    CC: Neck pain follow-up  [id number]   07/02/2018: Shoulder bursitis, work with Product/process development scientist to learn home exercises, topical anti-inflammatories, icing regimen, monitor otherwise.  Follow-up again in 4 to 8 weeks  Update 08/20/2018: Autumn [name] is a 33 y.o. female coming in with complaint of shoulder and back pain. Patient states that she has had improvement in her shoulder pain. Still having pain with abduction and flexion. Pain over deltoid tuberosity.  Notes pain with putting her purse on with IR.    Past Medical History:  Diagnosis Date  . Anxiety   . Back pain    chronic  . Depression   . Disc disease, degenerative, lumbar or lumbosacral   . History of ovarian cyst   . Migraine   . Scoliosis    Past Surgical History:  Procedure Laterality Date  . APPENDECTOMY    . BREAST SURGERY Right 2008   lump/ benign   Social History   Socioeconomic History  . Marital status: Married    Spouse name: Not on file  . Number of children: 1  . Years of education: Not on file  . Highest education level: Not on file  Occupational History  . Occupation: CMA  Social Needs  . Financial resource strain: Not on file  . Food insecurity:    Worry: Not on file    Inability: Not on file  . Transportation needs:    Medical: Not on file    Non-medical: Not on file  Tobacco Use  . Smoking status: Former Smoker    Types: Cigarettes    Last attempt to quit: 08/18/2016    Years since quitting: 2.0  . Smokeless tobacco: Never Used  . Tobacco comment: 3 cigarettes a day  Substance and Sexual Activity  . Alcohol use: Yes    Alcohol/week: 0.0 standard drinks    Comment: occasional  . Drug use: [name]  . Sexual activity: Yes    Birth control/protection:  Pill  Lifestyle  . Physical activity:    Days per week: Not on file    Minutes per session: Not on file  . Stress: Not on file  Relationships  . Social connections:    Talks on phone: Not on file    Gets together: Not on file    Attends religious service: Not on file    Active member of club or organization: Not on file    Attends meetings of clubs or organizations: Not on file    Relationship status: Not on file  Other Topics Concern  . Not on file  Social History Narrative  . Not on file   [name] Known Allergies Family History  Problem Relation Age of Onset  . Cancer Mother 13       breast  . Alcohol abuse Father   . Vision loss Maternal Grandmother   . Heart disease Maternal Grandmother   . Heart failure Maternal Grandmother   . Cancer Maternal Grandfather   . Alcohol abuse Paternal Grandfather   . Arthritis Maternal Aunt     Current Outpatient Medications (Endocrine & Metabolic):  Marland Kitchen  ALAYCEN 1/35 tablet, TAKE 1 TABLET BY MOUTH DAILY      Current Outpatient Medications (Other):  Marland Kitchen  Diclofenac Sodium (PENNSAID)  2 % SOLN, Place onto the skin. Marland Kitchen  escitalopram (LEXAPRO) 20 MG tablet, Take 20 mg by mouth daily.    Past medical history, social, surgical and family history all reviewed in electronic medical record.  [name] pertanent information unless stated regarding to the chief complaint.   Review of Systems:  [name] headache, visual changes, nausea, vomiting, diarrhea, constipation, dizziness, abdominal pain, skin rash, fevers, chills, night sweats, weight loss, swollen lymph nodes, body aches, joint swelling, muscle aches, chest pain, shortness of breath, mood changes.  [name] recent chest pain.  Patient was seen in emergency department, work-up was fairly remarkable for an elevated d-dimer but CT angiogram was unremarkable.  Objective  Blood pressure 118/86, pulse 86, height 5\' 10"  (1.778 m), weight 236 lb (107 kg), SpO2 97 %.    General: [name] apparent distress alert and  oriented x3 mood and affect normal, dressed appropriately.  HEENT: Pupils equal, extraocular movements intact  Respiratory: Patient's speak in full sentences and does not appear short of breath  Cardiovascular: [name] lower extremity edema, non tender, [name] erythema  Skin: 3 small vesicles on patient's anterior elbow appears to be well-healing with [name] sign of infection Abdomen: Soft nontender  Neuro: Cranial nerves II through XII are intact, neurovascularly intact in all extremities with 2+ DTRs and 2+ pulses.  Lymph: [name] lymphadenopathy of posterior or anterior cervical chain or axillae bilaterally.  Gait normal with good balance and coordination.  MSK:  Non tender with full range of motion and good stability and symmetric strength and tone of  elbows, wrist, hip, knee and ankles bilaterally.   Neck: Inspection mild loss of lordosis. [name] palpable stepoffs. Negative Spurling's maneuver. Full neck range of motion Grip strength and sensation normal in bilateral hands Strength good C4 to T1 distribution [name] sensory change to C4 to T1 Negative Hoffman sign bilaterally Reflexes normal Left shoulder exam still has positive impingement.  Patient has near full range of motion noted.  4+ out of 5 strength of rotator cuff.  Grip strength intact  Osteopathic findings  C6 flexed rotated and side bent left T3 extended rotated and side bent right inhaled third rib T5 extended rotated and side bent left      Impression and Recommendations:     This case required medical decision making of moderate complexity. The above documentation has been reviewed and is accurate and complete Lyndal Pulley, DO       Note: This dictation was prepared with Dragon dictation along with smaller phrase technology. Any transcriptional errors that result from this process are unintentional.

## 2018-08-20 NOTE — Patient Instructions (Signed)
Good ot see you  I hope you start feeling better Posture is key  Continnue everything we are doing  Keep working on the shoulder See me again in 6-8 weeks

## 2018-08-20 NOTE — Assessment & Plan Note (Signed)
Decision today to treat with OMT was based on Physical Exam  After verbal consent patient was treated with HVLA, ME, FPR techniques in cervical, thoracic, rib lumbar and sacral areas  Patient tolerated the procedure well with improvement in symptoms  Patient given exercises, stretches and lifestyle modifications  See medications in patient instructions if given  Patient will follow up in 4-8 weeks 

## 2018-08-20 NOTE — Assessment & Plan Note (Signed)
Stable and does still seem to have impingement.  Patient has declined any type of injection at the moment.  Does not want to change management at the moment.

## 2018-08-21 ENCOUNTER — Encounter: Payer: Self-pay | Admitting: Family Medicine

## 2018-08-21 ENCOUNTER — Ambulatory Visit: Payer: 59 | Admitting: Family Medicine

## 2018-08-21 VITALS — BP 114/78 | HR 70 | Temp 98.7°F | Ht 70.0 in | Wt 234.4 lb

## 2018-08-21 DIAGNOSIS — M791 Myalgia, unspecified site: Secondary | ICD-10-CM | POA: Diagnosis not present

## 2018-08-21 DIAGNOSIS — R0789 Other chest pain: Secondary | ICD-10-CM

## 2018-08-21 MED ORDER — PREDNISONE 10 MG PO TABS: ORAL_TABLET | ORAL | 0 refills | Status: DC

## 2018-08-21 NOTE — Patient Instructions (Signed)
Great to see you. I will call you with your lab results from today and you can view them online.   Let's try a course of prednisone.

## 2018-08-21 NOTE — Progress Notes (Signed)
Subjective:   Patient ID: Autumn [name], female    DOB: [birth year], 33 y.o.   MRN: [id number]  Autumn [name] is a pleasant 33 y.o. year old female who presents to clinic today with Hospitalization Follow-up (Patient is here today for a hospital F/U.  She was seen at [facility] on 3.7.20 for CP with concerns for PE due to right sided CP for 2 weeks and a positive D-Dimer.  They confirmed 48mm nodule in anterior right lung but no other findings.  She states that she pretty much feels the same.) and Skin Problem (When she was driving home from the hospital she noticed her right arm to be very itchy.  There are 3 spots on her arm and one on the back upper arm.  They appeared as blisters at first.  She wants to be certain that things are fine.)  on 08/21/2018  HPI:  ER follow up- patient seen at [facility] ED on 08/16/18 for chest pain.  Notes reviewed. Concerns for PE due to right sided chest pain with positive D Dimer as outpatient.  Saw Allie Bossier on 08/15/18 for CP- note reviewed. D dimer was positive so she was advised to go to ER for CT angio.  Troponin neg  EKG NSR, U preg neg, CBC unremarkable CT angio showed 5 mm nodule anterior right lung but no other findings. Radiologist recommended follow up CT of chest in 12 months if patient is high risk.  Pain is on right side and goes back to her shoulder blade- notices that it is aggravated with exertion.  She feels she has a heavier feeling when she takes deep breaths on right side.  Tums and nexium has not helped.  No LE edema.  No recent travel. She has seen Dr. Rockey Situ in past for palpitations but has never had anything like this before.  She has noticed that even light touch sometimes hurts more than it used to.  Dg Chest 2 View  Result Date: 08/15/2018 CLINICAL DATA:  Right-sided chest pain for 2 weeks. EXAM: CHEST - 2 VIEW COMPARISON:  04/27/2014 from [place] outpatient center FINDINGS: The heart size and mediastinal contours are  within normal limits. Both lungs are clear. No evidence of pneumothorax or pleural effusion. The visualized skeletal structures are unremarkable. IMPRESSION: Normal study.  No active cardiopulmonary disease. Electronically Signed   By: Earle Gell M.D.   On: 08/15/2018 09:14   Ct Angio Chest Pe W And/or Wo Contrast  Result Date: 08/16/2018 CLINICAL DATA:  Patient recently started birth control. Concern for blood clot. PE suspected. Chest x-ray yesterday for right-sided chest pain. EXAM: CT ANGIOGRAPHY CHEST WITH CONTRAST TECHNIQUE: Multidetector CT imaging of the chest was performed using the standard protocol during bolus administration of intravenous contrast. Multiplanar CT image reconstructions and MIPs were obtained to evaluate the vascular anatomy. CONTRAST:  78mL OMNIPAQUE IOHEXOL 350 MG/ML SOLN COMPARISON:  Chest x-ray August 15, 2018 FINDINGS: Cardiovascular: Satisfactory opacification of the pulmonary arteries to the segmental level. No evidence of pulmonary embolism. Normal heart size. No pericardial effusion. Mediastinum/Nodes: No enlarged mediastinal, hilar, or axillary lymph nodes. Thyroid gland, trachea, and esophagus demonstrate no significant findings. Lungs/Pleura: There is a 5 mm nodule in the anterior right lung on series 6, image 45. No other nodules. No masses or focal infiltrates. No evidence of pneumonia. Upper Abdomen: No acute abnormality. Musculoskeletal: No chest wall abnormality. No acute or significant osseous findings. Review of the MIP images confirms the above findings. IMPRESSION: 1.  No pulmonary emboli.  No acute abnormalities. 2. 5 mm nodule in the anterior right lung. No follow-up needed if patient is low-risk. Non-contrast chest CT can be considered in 12 months if patient is high-risk. This recommendation follows the consensus statement: Guidelines for Management of Incidental Pulmonary Nodules Detected on CT Images: From the Fleischner Society 2017; Radiology 2017;  284:228-243. Electronically Signed   By: Dorise Bullion III M.D   On: 08/16/2018 18:14   Recent Results (from the past 2160 hour(s))  POCT Influenza A/B     Status: Normal   Collection Time: 07/05/18 12:27 PM  Result Value Ref Range   Influenza A, POC Negative Negative   Influenza B, POC Negative Negative  POCT rapid strep A     Status: Normal   Collection Time: 07/05/18 12:27 PM  Result Value Ref Range   Rapid Strep A Screen Negative Negative  Cervicovaginal ancillary only( [place])     Status: None   Collection Time: 07/09/18 12:00 AM  Result Value Ref Range   Bacterial vaginitis Negative for Bacterial Vaginitis Microorganisms     Comment: Normal Reference Range - Negative   Candida vaginitis Negative for Candida species     Comment: Normal Reference Range - Negative   Chlamydia Negative     Comment: Normal Reference Range - Negative   Neisseria gonorrhea Negative     Comment: Normal Reference Range - Negative   Trichomonas Negative     Comment: Normal Reference Range - Negative   HPV NOT DETECTED     Comment: Normal Reference Range - Negative  Cervicovaginal ancillary only     Status: None   Collection Time: 07/09/18 12:00 AM  Result Value Ref Range   Herpes NEGATIVE for HSV 1 & 2     Comment: Normal Reference Range - Negative  HIV Antibody (routine testing w rflx)     Status: None   Collection Time: 07/09/18  2:20 PM  Result Value Ref Range   HIV 1&2 Ab, 4th Generation NON-REACTIVE NON-REACTI    Comment: HIV-1 antigen and HIV-1/HIV-2 antibodies were not detected. There is no laboratory evidence of HIV infection. Marland Kitchen PLEASE NOTE: This information has been disclosed to you from records whose confidentiality may be protected by state law.  If your state requires such protection, then the state law prohibits you from making any further disclosure of the information without the specific written consent of the person to whom it pertains, or as otherwise permitted by  law. A general authorization for the release of medical or other information is NOT sufficient for this purpose. . For additional information please refer to http://education.questdiagnostics.com/faq/FAQ106 (This link is being provided for informational/ educational purposes only.) . Marland Kitchen The performance of this assay has not been clinically validated in patients less than 48 years old. .   RPR     Status: None   Collection Time: 07/09/18  2:20 PM  Result Value Ref Range   RPR Ser Ql NON-REACTIVE NON-REACTI  Urine Culture     Status: Abnormal   Collection Time: 07/09/18  2:20 PM  Result Value Ref Range   MICRO NUMBER: [id number]    SPECIMEN QUALITY: Adequate    Sample Source URINE    STATUS: FINAL    ISOLATE 1: Klebsiella pneumoniae (A)     Comment: 10,000-50,000 CFU/mL of Klebsiella pneumoniae      Susceptibility   Klebsiella pneumoniae - URINE CULTURE, REFLEX    AMOX/CLAVULANIC 4 Sensitive     AMPICILLIN >=32  Resistant     AMPICILLIN/SULBACTAM 4 Sensitive     CEFAZOLIN* <=4 Not Reportable      * For infections other than uncomplicated UTIcaused by E. coli, K. pneumoniae or P. mirabilis:Cefazolin is resistant if MIC > or = 8 mcg/mL.(Distinguishing susceptible versus intermediatefor isolates with MIC < or = 4 mcg/mL requiresadditional testing.)For uncomplicated UTI caused by E. coli,K. pneumoniae or P. mirabilis: Cefazolin issusceptible if MIC <32 mcg/mL and predictssusceptible to the oral agents cefaclor, cefdinir,cefpodoxime, cefprozil, cefuroxime, cephalexinand loracarbef.    CEFEPIME <=1 Sensitive     CEFTRIAXONE <=1 Sensitive     CIPROFLOXACIN <=0.25 Sensitive     LEVOFLOXACIN <=0.12 Sensitive     ERTAPENEM <=0.5 Sensitive     GENTAMICIN <=1 Sensitive     IMIPENEM <=0.25 Sensitive     NITROFURANTOIN 64 Intermediate     PIP/TAZO <=4 Sensitive     TOBRAMYCIN <=1 Sensitive     TRIMETH/SULFA* <=20 Sensitive      * For infections other than uncomplicated UTIcaused by E. coli,  K. pneumoniae or P. mirabilis:Cefazolin is resistant if MIC > or = 8 mcg/mL.(Distinguishing susceptible versus intermediatefor isolates with MIC < or = 4 mcg/mL requiresadditional testing.)For uncomplicated UTI caused by E. coli,K. pneumoniae or P. mirabilis: Cefazolin issusceptible if MIC <32 mcg/mL and predictssusceptible to the oral agents cefaclor, cefdinir,cefpodoxime, cefprozil, cefuroxime, cephalexinand loracarbef.Legend:S = Susceptible  I = IntermediateR = Resistant  NS = Not susceptible* = Not tested  NR = Not reported**NN = See antimicrobic comments  POCT urinalysis dipstick     Status: Abnormal   Collection Time: 07/09/18  2:45 PM  Result Value Ref Range   Color, UA ear color    Clarity, UA cloudy    Glucose, UA Negative Negative   Bilirubin, UA neg    Ketones, UA neg    Spec Grav, UA >=1.030 (A) 1.010 - 1.025   Blood, UA neg    pH, UA 5.5 5.0 - 8.0   Protein, UA Negative Negative   Urobilinogen, UA 0.2 0.2 or 1.0 E.U./dL   Nitrite, UA neg    Leukocytes, UA Trace (A) Negative    Comment: 15 Leu   Appearance     Odor    TSH     Status: None   Collection Time: 08/15/18  8:54 AM  Result Value Ref Range   TSH 1.10 0.35 - 4.50 uIU/mL  D-dimer, quantitative (not at [facility])     Status: Abnormal   Collection Time: 08/15/18  8:54 AM  Result Value Ref Range   D-Dimer, Quant 1.00 (H) <0.50 mcg/mL FEU    Comment: . The D-Dimer test is used frequently to exclude an acute PE or DVT. In patients with a low to moderate clinical risk assessment and a D-Dimer result <0.50 mcg/mL FEU, the likelihood of a PE or DVT is very low. However, a thromboembolic event should not be excluded solely on the basis of the D-Dimer level. Increased levels of D-Dimer are associated with a PE, DVT, DIC, malignancies, inflammation, sepsis, surgery, trauma, pregnancy, and advancing patient age. [Jama 2006 11:295(2):199-207] . For additional information, please refer  to: http://education.questdiagnostics.com/faq/FAQ149 (This link is being provided for informational/ educational purposes only) .   Basic metabolic panel     Status: Abnormal   Collection Time: 08/16/18  3:51 PM  Result Value Ref Range   Sodium 141 135 - 145 mmol/L   Potassium 3.8 3.5 - 5.1 mmol/L   Chloride 109 98 - 111 mmol/L   CO2 21 (  L) 22 - 32 mmol/L   Glucose, Bld 93 70 - 99 mg/dL   BUN 13 6 - 20 mg/dL   Creatinine, Ser 0.65 0.44 - 1.00 mg/dL   Calcium 9.4 8.9 - 10.3 mg/dL   GFR calc non Af Amer >60 >60 mL/min   GFR calc Af Amer >60 >60 mL/min   Anion gap 11 5 - 15    Comment: Performed at [facility], [address] [place], [place] [address]  CBC     Status: None   Collection Time: 08/16/18  3:51 PM  Result Value Ref Range   WBC 8.0 4.0 - 10.5 K/uL   RBC 4.62 3.87 - 5.11 MIL/uL   Hemoglobin 14.0 12.0 - 15.0 g/dL   HCT 40.8 36.0 - 46.0 %   MCV 88.3 80.0 - 100.0 fL   MCH 30.3 26.0 - 34.0 pg   MCHC 34.3 30.0 - 36.0 g/dL   RDW 12.3 11.5 - 15.5 %   Platelets 314 150 - 400 K/uL   nRBC 0.0 0.0 - 0.2 %    Comment: Performed at [facility], [address] [place], [place] [address]  Troponin I - ONCE - STAT     Status: None   Collection Time: 08/16/18  3:51 PM  Result Value Ref Range   Troponin I <0.03 <0.03 ng/mL    Comment: Performed at [facility], [name] [place], [place] [address]  Fibrin derivatives D-Dimer [facility] only)     Status: Abnormal   Collection Time: 08/16/18  4:14 PM  Result Value Ref Range   Fibrin derivatives D-dimer ([facility]) 1,071.94 (H) 0.00 - 499.00 ng/mL (FEU)    Comment: (NOTE) <> Exclusion of Venous Thromboembolism (VTE) - OUTPATIENT ONLY   (Emergency Department or [place])   0-499 ng/ml (FEU): With a low to intermediate pretest probability                      for VTE this test result excludes the diagnosis                      of VTE.   >499 ng/ml (FEU) : VTE not excluded; additional work up for VTE is                       required. <> Testing on Inpatients and Evaluation of Disseminated Intravascular   Coagulation (DIC) Reference Range:   0-499 ng/ml (FEU) Performed at [facility], [address] [place], [place] [address]   Pregnancy, urine POC     Status: None   Collection Time: 08/16/18  4:26 PM  Result Value Ref Range   Preg Test, Ur NEGATIVE NEGATIVE    Comment:        THE SENSITIVITY OF THIS METHODOLOGY IS >24 mIU/mL      Current Outpatient Medications on File Prior to Visit  Medication Sig Dispense Refill   ALAYCEN 1/35 tablet TAKE 1 TABLET BY MOUTH DAILY 84 tablet 1   Diclofenac Sodium (PENNSAID) 2 % SOLN Place onto the skin.     escitalopram (LEXAPRO) 20 MG tablet Take 20 mg by mouth daily.     No current facility-administered medications on file prior to visit.     No Known Allergies  Past Medical History:  Diagnosis Date   Anxiety    Back pain    chronic   Depression    Disc disease, degenerative, lumbar or lumbosacral  History of ovarian cyst    Migraine    Scoliosis     Past Surgical History:  Procedure Laterality Date   APPENDECTOMY     BREAST SURGERY Right 2008   lump/ benign    Family History  Problem Relation Age of Onset   Cancer Mother 53       breast   Alcohol abuse Father    Vision loss Maternal Grandmother    Heart disease Maternal Grandmother    Heart failure Maternal Grandmother    Cancer Maternal Grandfather    Alcohol abuse Paternal Grandfather    Arthritis Maternal Aunt     Social History   Socioeconomic History   Marital status: Married    Spouse name: Not on file   Number of children: 1   Years of education: Not on file   Highest education level: Not on file  Occupational History   Occupation: CMA  Social Needs   Financial resource strain: Not on file   Food insecurity:    Worry: Not on file    Inability: Not on file   Transportation needs:    Medical: Not on file     Non-medical: Not on file  Tobacco Use   Smoking status: Former Smoker    Types: Cigarettes    Last attempt to quit: 08/18/2016    Years since quitting: 2.0   Smokeless tobacco: Never Used   Tobacco comment: 3 cigarettes a day  Substance and Sexual Activity   Alcohol use: Yes    Alcohol/week: 0.0 standard drinks    Comment: occasional   Drug use: No   Sexual activity: Yes    Birth control/protection: Pill  Lifestyle   Physical activity:    Days per week: Not on file    Minutes per session: Not on file   Stress: Not on file  Relationships   Social connections:    Talks on phone: Not on file    Gets together: Not on file    Attends religious service: Not on file    Active member of club or organization: Not on file    Attends meetings of clubs or organizations: Not on file    Relationship status: Not on file   Intimate partner violence:    Fear of current or ex partner: Not on file    Emotionally abused: Not on file    Physically abused: Not on file    Forced sexual activity: Not on file  Other Topics Concern   Not on file  Social History Narrative   Not on file   The PMH, PSH, Social History, Family History, Medications, and allergies have been reviewed in [facility], and have been updated if relevant.   Review of Systems  Constitutional: Positive for fatigue.  HENT: Negative.   Respiratory: Positive for shortness of breath. Negative for apnea, cough, choking and chest tightness.   Cardiovascular: Positive for chest pain. Negative for palpitations and leg swelling.  Gastrointestinal: Negative.   Endocrine: Negative.   Genitourinary: Negative.   Musculoskeletal: Positive for arthralgias and myalgias.  Allergic/Immunologic: Negative.   Neurological: Negative.   Hematological: Negative.   Psychiatric/Behavioral: Negative.   All other systems reviewed and are negative.      Objective:    BP 114/78 (BP Location: Left Arm, Patient Position: Sitting, Cuff Size:  Normal)    Pulse 70    Temp 98.7 F (37.1 C) (Oral)    Ht 5\' 10"  (1.778 m)    Wt 234 lb 6.4  oz (106.3 kg)    SpO2 98%    BMI 33.63 kg/m    Physical Exam Vitals signs and nursing note reviewed.  Constitutional:      Appearance: Normal appearance.  HENT:     Head: Normocephalic and atraumatic.     Right Ear: External ear normal.     Left Ear: External ear normal.     Nose: Nose normal.     Mouth/Throat:     Mouth: Mucous membranes are moist.  Eyes:     Extraocular Movements: Extraocular movements intact.  Neck:     Musculoskeletal: Normal range of motion and neck supple.  Cardiovascular:     Rate and Rhythm: Normal rate and regular rhythm.     Pulses: Normal pulses.     Heart sounds: Normal heart sounds.  Pulmonary:     Effort: Pulmonary effort is normal. No respiratory distress.     Breath sounds: Normal breath sounds. No stridor. No wheezing or rhonchi.  Musculoskeletal: Normal range of motion.        General: No swelling or tenderness.     Right lower leg: No edema.     Left lower leg: No edema.  Skin:    General: Skin is warm and dry.  Neurological:     General: No focal deficit present.     Mental Status: She is alert and oriented to person, place, and time. Mental status is at baseline.  Psychiatric:        Mood and Affect: Mood normal.        Behavior: Behavior normal.        Thought Content: Thought content normal.        Judgment: Judgment normal.           Assessment & Plan:   Other chest pain - Plan: Sedimentation rate, C-reactive protein, Rheumatoid Factor  Myalgia - Plan: Lipase, H. pylori antibody, IgG No follow-ups on file.

## 2018-08-21 NOTE — Assessment & Plan Note (Signed)
>  40 minutes spent in face to face time with patient, >50% spent in counselling or coordination of care reviewing CP, course of symptoms, lab results, CT angiogram findings, including lung nodule. ? If this is a type of pleuresy as it worsens with deep breaths.  Given short course of prednisone. Also check additional labs today.  She is under more stress and she does feel this is likely playing a role.  If symptoms persist, she agrees to cardiology referral. Call or return to clinic prn if these symptoms worsen or fail to improve as anticipated. The patient indicates understanding of these issues and agrees with the plan.

## 2018-08-22 ENCOUNTER — Other Ambulatory Visit: Payer: Self-pay

## 2018-08-22 ENCOUNTER — Ambulatory Visit
Admission: RE | Admit: 2018-08-22 | Discharge: 2018-08-22 | Disposition: A | Discharge status: Home / Self Care | Payer: 59 | Source: Ambulatory Visit | Attending: Primary Care | Admitting: Primary Care

## 2018-08-22 DIAGNOSIS — R0789 Other chest pain: Secondary | ICD-10-CM

## 2018-08-22 DIAGNOSIS — Z803 Family history of malignant neoplasm of breast: Secondary | ICD-10-CM | POA: Diagnosis not present

## 2018-08-22 DIAGNOSIS — R928 Other abnormal and inconclusive findings on diagnostic imaging of breast: Secondary | ICD-10-CM | POA: Diagnosis not present

## 2018-08-22 DIAGNOSIS — N644 Mastodynia: Secondary | ICD-10-CM | POA: Diagnosis not present

## 2018-08-25 ENCOUNTER — Other Ambulatory Visit (INDEPENDENT_AMBULATORY_CARE_PROVIDER_SITE_OTHER): Payer: 59

## 2018-08-25 ENCOUNTER — Other Ambulatory Visit: Payer: Self-pay

## 2018-08-25 DIAGNOSIS — M791 Myalgia, unspecified site: Secondary | ICD-10-CM | POA: Diagnosis not present

## 2018-08-25 DIAGNOSIS — R0789 Other chest pain: Secondary | ICD-10-CM | POA: Diagnosis not present

## 2018-08-25 LAB — SEDIMENTATION RATE: Sed Rate: 8 mm/hr (ref 0–20)

## 2018-08-25 LAB — C-REACTIVE PROTEIN: CRP: <1 mg/dL (ref 0.5–20.0)

## 2018-08-25 LAB — LIPASE: Lipase: 25 U/L (ref 11.0–59.0)

## 2018-08-25 LAB — H. PYLORI ANTIBODY, IGG: H PYLORI IGG: NEGATIVE

## 2018-08-26 ENCOUNTER — Encounter: Payer: Self-pay | Admitting: Family Medicine

## 2018-08-26 LAB — RHEUMATOID FACTOR: Rheumatoid fact SerPl-aCnc: <14 IU/mL (ref <14)

## 2018-08-27 ENCOUNTER — Encounter: Payer: 59 | Attending: Family Medicine | Admitting: Dietician

## 2018-08-27 ENCOUNTER — Other Ambulatory Visit: Payer: Self-pay

## 2018-08-27 ENCOUNTER — Encounter: Payer: Self-pay | Admitting: Dietician

## 2018-08-27 VITALS — Ht 69.75 in | Wt 231.5 lb

## 2018-08-27 DIAGNOSIS — Z713 Dietary counseling and surveillance: Secondary | ICD-10-CM

## 2018-08-27 DIAGNOSIS — R21 Rash and other nonspecific skin eruption: Secondary | ICD-10-CM | POA: Diagnosis not present

## 2018-08-27 NOTE — Progress Notes (Signed)
[employer] Employee "self referral" nutrition session: Start time: 1515   End time: 3014  Height: 5'9.75"  Weight: 231.5lbs  Met with employee to discuss his/her nutritional concerns and diet history.   Diet history:   Patient reports some weight gain when pregnant with son, did lose most but several years later had unexplained weight gain for about 9 months and has had difficulty losing much weight since then.    She finds it easier to eat healthier when home; food was often catered at her previous job.  She has avoided eating [place] fries for the past few weeks.   Typical eating pattern: Breakfast: 1c coffee with coffee and creamer, usually skips; occ hot pocket or slice toast with peanut butter and jelly (does not like plain peanut butter) Snack: handful pretzels or baby carrots with hummus; orange, apple; cottage cheese with fruit Lunch: 1pm at work-- [employer] with grilled chicken, Ranch, no bread; or frozen meal from home  Snack: none Supper: 6-7pm-- (husband and son very picky) noodles with meat or shrimp; potatoes with meat and sometimes broccoli; rice and beans with corn with sour cream; occ drinks orange juice. Snack: usually none; sometimes piece of candy (no large snack); ice cream maybe once a week Beverages: water, sparkling water, green tea, 1c coffee, occ small 1/2-sweet tea  Pysical activity: occasional walking, bicycling not regularly  Education topics covered during this visit:  General nutrition/ Healthy eating  Exercise  Weight Concerns   Educational resources provided:  Museum/gallery conservator with food lists  Sample menus    Intervention:  Patient has been working on eating balanced meals and limiting higher calorie choices.  Her goal is to further improve nutritional balance in meals and control portions of foods, particularly starches.   She will plan to return for a follow-up visit in 1 month

## 2018-08-27 NOTE — Patient Instructions (Signed)
   Work to include a protein source, a moderate amount of carbs, and low-carb (free) veggies with meals for a healthy balance.   Increase physical activity such as walking or bike riding.

## 2018-09-04 ENCOUNTER — Ambulatory Visit (INDEPENDENT_AMBULATORY_CARE_PROVIDER_SITE_OTHER): Payer: 59

## 2018-09-04 VITALS — Temp 98.9°F

## 2018-09-04 DIAGNOSIS — R52 Pain, unspecified: Secondary | ICD-10-CM

## 2018-09-04 DIAGNOSIS — J029 Acute pharyngitis, unspecified: Secondary | ICD-10-CM | POA: Diagnosis not present

## 2018-09-04 LAB — POC INFLUENZA A&B (BINAX/QUICKVUE)
Influenza A, POC: NEGATIVE
Influenza B, POC: NEGATIVE

## 2018-09-04 NOTE — Progress Notes (Signed)
Agree with plan 

## 2018-09-04 NOTE — Progress Notes (Addendum)
Patient came in for nurse visit following physician orders to obtain Flu testing and Strep culture for symptoms.   Patient has been experiencing increase in PND, sore  Throat and has begun to have body aches, fatigue and malaise.  She denies any cough, congestion or dyspnea and has not traveled or been exposed to anyone that she is aware of in past 2 weeks with positive COVID.    Temp earlier today 99.3; however, in office temp is now 98.9 without medication.   Reviewed with Dr. Einar Pheasant.  Flu is negative, strep culture results pending; however, upon collection of oral swab there was no noted exudate, pus or swollen tonsil areas.  I did note red streaking to back of throat.    Patient will go home and treat symptoms with allergy medication, increase fluids and rest.  She will carefully monitor temp regularly and call if symptoms worse or if temp elevates.  We will contact her once culture results return.

## 2018-09-06 LAB — CULTURE, GROUP A STREP
MICRO NUMBER:: 355678
SPECIMEN QUALITY:: ADEQUATE

## 2018-09-15 ENCOUNTER — Ambulatory Visit (INDEPENDENT_AMBULATORY_CARE_PROVIDER_SITE_OTHER): Payer: 59 | Admitting: Family Medicine

## 2018-09-15 ENCOUNTER — Other Ambulatory Visit: Payer: Self-pay

## 2018-09-15 ENCOUNTER — Encounter: Payer: Self-pay | Admitting: Family Medicine

## 2018-09-15 VITALS — BP 110/74 | HR 88 | Temp 98.8°F | Ht 70.0 in | Wt 235.2 lb

## 2018-09-15 DIAGNOSIS — S0990XA Unspecified injury of head, initial encounter: Secondary | ICD-10-CM

## 2018-09-15 DIAGNOSIS — M25551 Pain in right hip: Secondary | ICD-10-CM | POA: Diagnosis not present

## 2018-09-15 NOTE — Progress Notes (Signed)
Autumn Galer T. [name], MD Primary Care and [place] at [facility] [place], [address] Phone: [phone]  FAX: [phone]  Autumn [name] - 33 y.o. female  MRN [id number]  Date of Birth: [birth year]  Visit Date: 09/15/2018  PCP: Autumn Passy, MD  Referred by: Autumn Passy, MD  Chief Complaint  Patient presents with  . Head Injury    Hit in head yesterday by 2x4  . Hip Pain    Right   Subjective:   Autumn [name] is a 33 y.o. very pleasant female patient who presents with the following:  DOI 09/14/2018:  [place] off the deck and fell down 2 x 4 maybe thicker and did not lose consciousness.  Could not move and mouth watered some. No acute visual changes.  Head hurting at that time.  Closed her eyes for a while No skin reakdown.  Laid on the bed and took some.  Burning is better today.  5-6/10 Bump is less big yesterday.    Please see the scanned SCAT 5 forms for detailed symptoms.   Some swelling on hip.  Some nausea today.  Driving made her worse and more symptomatic.  Some vertigo. No prior head injuries.  Migraine Dep / Anxiety, on Lexapro Has had some neck rad, but gone.  No ADD or dyslexia  From a symptom standpoint she is having symptoms relatively across the board with no significant neck pain, no photophobia, no significant confusion, some mild irritability and sadness, some increased anxiety.  Some headache, some moderate pressure in the head.  Induction of nausea today with driving.  Some dizziness, and some blurred vision and ocular difficulties particular with use at the smart phone.  She is having some phonophobia, she does feel some cognitive slowing and she feels as if she is in a fog and does not quite feel right, mild symptomatology.  Some mild difficulty concentrating and difficulty remembering.  Some doubling Some nyst  BESS -  Tandem -  Single leg  33 yo.  Past Medical  History, Surgical History, Social History, Family History, Problem List, Medications, and Allergies have been reviewed and updated if relevant.  Patient Active Problem List   Diagnosis Date Noted  . Myalgia 08/21/2018  . Shingles 06/20/2018  . Wrist pain, acute, right 05/20/2018  . Neck pain 04/16/2018  . Nonallopathic lesion of cervical region 04/16/2018  . Nonallopathic lesion of rib cage 04/16/2018  . Nonallopathic lesion of thoracic region 04/16/2018  . Right lower quadrant abdominal pain 10/28/2017  . Chest pain 09/02/2017  . Epigastric discomfort 09/02/2017  . Contraception management 08/30/2016  . Family history of breast cancer in mother 08/30/2016  . Obesity (BMI 30.0-34.9) 08/19/2014  . Amenorrhea 12/25/2013  . Anxiety and depression 07/15/2012  . Screening for STD (sexually transmitted disease) 01/10/2012    Past Medical History:  Diagnosis Date  . Anxiety   . Back pain    chronic  . Depression   . Disc disease, degenerative, lumbar or lumbosacral   . History of ovarian cyst   . Migraine   . Scoliosis     Past Surgical History:  Procedure Laterality Date  . APPENDECTOMY    . BREAST SURGERY Right 2008   lump/ benign    Social History   Socioeconomic History  . Marital status: Married    Spouse name: Not on file  . Number of children: 1  . Years of education: Not  on file  . Highest education level: Not on file  Occupational History  . Occupation: CMA  Social Needs  . Financial resource strain: Not on file  . Food insecurity:    Worry: Not on file    Inability: Not on file  . Transportation needs:    Medical: Not on file    Non-medical: Not on file  Tobacco Use  . Smoking status: Former Smoker    Types: Cigarettes    Last attempt to quit: 08/18/2016    Years since quitting: 2.0  . Smokeless tobacco: Never Used  . Tobacco comment: 3 cigarettes a day  Substance and Sexual Activity  . Alcohol use: Yes    Alcohol/week: 0.0 standard drinks     Comment: occasional  . Drug use: No  . Sexual activity: Yes    Birth control/protection: Pill  Lifestyle  . Physical activity:    Days per week: Not on file    Minutes per session: Not on file  . Stress: Not on file  Relationships  . Social connections:    Talks on phone: Not on file    Gets together: Not on file    Attends religious service: Not on file    Active member of club or organization: Not on file    Attends meetings of clubs or organizations: Not on file    Relationship status: Not on file  . Intimate partner violence:    Fear of current or ex partner: Not on file    Emotionally abused: Not on file    Physically abused: Not on file    Forced sexual activity: Not on file  Other Topics Concern  . Not on file  Social History Narrative  . Not on file    Family History  Problem Relation Age of Onset  . Cancer Mother 75       breast  . Breast cancer Mother 69       and 51  . Alcohol abuse Father   . Vision loss Maternal Grandmother   . Heart disease Maternal Grandmother   . Heart failure Maternal Grandmother   . Cancer Maternal Grandfather   . Alcohol abuse Paternal Grandfather   . Arthritis Maternal Aunt     No Known Allergies  Medication list reviewed and updated in full in [place].   GEN: No acute illnesses, no fevers, chills. GI: No n/v/d, eating normally Pulm: No SOB Interactive and getting along well at home.  Otherwise, ROS is as per the HPI.  Objective:   BP 110/74   Pulse 88   Temp 98.8 F (37.1 C) (Oral)   Ht 5\' 10"  (1.778 m)   Wt 235 lb 4 oz (106.7 kg)   LMP 08/18/2018   BMI 33.75 kg/m   GEN: WDWN, NAD, Non-toxic, A & O x 3 HEENT: Atraumatic, Normocephalic. Neck supple. No masses, No LAD. Ears and Nose: No external deformity. EXTR: No c/c/e PSYCH: Normally interactive. Conversant. Not depressed or anxious appearing.  Calm demeanor.   Neuro: CN 2-12 grossly intact. PERRLA. EOMI. Sensation intact throughout. Str 5/5 all  extremities. DTR 2+. No clonus. A and o x 4. Romberg neg. Finger nose neg.   Mild nystagmus with eye tracking.  No induction of vertigo with head rotation. Some convergence / accomodation difficulties up close with some doubling on visual field exam.  BESS testing yields unsteadiness with tandem and one legged closed eye checks.  Full ROM at the neck.  Mild tenderness in  the occiput  TTP mildly posterior R hip, no significant bruising. This portion of the physical examination was chaperoned by Hedy Camara, CMA.   PSYCH: Normally interactive. 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  For now, physical and cog rest, reviewed in detail.  Please see my p/i for detailed instructions given. Additional time spent in counselling regarding concussion subtypes, reassurance that she is not at fault, POC on what not to do, no driving, no smartphone, no reading, etc. With updated plan of care.  When less / no symptoms very mild walk is OK.  Mild soft tissue injury R hip.  F/u 1 week.  No work, no driving.  Patient Instructions  Mild Traumatic Brain Injury (CONCUSSSION):  Owens Loffler, MD, [facility] Adapted from [place], [employer], and [employer]  Think of the human brain almost as an egg yolk and your skull as an egg shell. When your head or body takes a hit, it can cause your brain to shake around inside your skull, injuring the brain. A concussion is not only caused by a hit to the head, but can also be caused by an impact to the body that ends up shaking your brain in your skull, such as whiplash.  A common misconception about concussion is that one loses consciousness. However, loss of consciousness  occurs in only less than 10% of concussion cases.  Concussive symptoms typically resolve in 7 to 10 days (sports-related concussions) or within 3 months (non-athletes).  Approximately 20% of people have symptoms after 6 weeks.  With concussions, we have learned that it generally takes youth longer to recover from concussions. Another thing that we now know about concussions is that it usually takes female athletes longer to recover than female athletes.  No two concussions are identical. In fact, there are at least six different clinical trajectories that concussions may take.  TREATMENT:  THE MOST IMPORTANT THING IS REST AS SOON AS POSSIBLE AFTER INJURY SO THAT THE BRAIN CAN RECOVER. COMPLETE PHYSICAL AND MENTAL REST ARE NEEDED INITIALLY.   THAT MEANS: NO SCHOOL OR WORK FOR AT LEAST 3 DAYS AND CLEARED BY YOUR DOCTOR NO MENTAL EXERTION, MEANING NO WORK, NO HOMEWORK, NO TEST TAKING.  Avoid situations with loud noise, bright lights, or crowds. However, this doesn't mean isolate yourself in a dark room for a week. Too much isolation and boredom can be harmful, contributing to feelings of anxiety, depression, and resulting in increased recovery time. Spend time with friends and family, but monitor your symptoms and avoid situations that make you feel worse.   NO VIDEO GAMES, NO USING THE COMPUTER, NO TEXTING, NO USING SMARTPHONES, NO USE OF AN IPAD OR TABLET. DO NOT GO TO A MOVIE THEATRE OR WATCH SPORTS ON TV. HDTV TENDS TO MAKE PEOPLE FEEL WORSE.   ON APPROXIMATELY DAY 4, BEGIN VERY LIGHT EXERCISE SUCH AS WALKING. DO NOT START ANY STRENUOUS EXERCISE.   You will be given return to school or return to work recommendations.   Signs and Symptoms of Concussion:  The signs and symptoms of a concussion are incredibly important because a concussion doesn't show up on imaging like an x-ray, CT, or MRI scan and there is no  objective test, like a blood or saliva test, that can determine if a patient  has a concussion. A doctor makes a concussion diagnosis based on the results of a comprehensive examination, which includes observing signs of concussion and patients reporting symptoms of concussion appearing after an impact to the head or body. Concussion signs and symptoms are the brain's way of showing it is injured and not functioning normally.  CONCUSSION SIGNS  Concussion signs are what someone could observe about you to determine if you have a concussion.   Common concussion signs include:  Loss of consciousness Problems with balance Glazed look in the eyes Amnesia Delayed response to questions Forgetting an instruction, confusion about an assignment or position, or  confusion of the game, score, or opponent Inappropriate crying Inappropriate laughter Vomiting  CONCUSSION SYMPTOMS  Concussion symptoms are what someone who is concussed will tell you that they are experiencing. Concussion symptoms typically fall into six major categories:  1- Somatic (Physical) Symptoms  Headache Light-headedness Dizziness Nausea Sensitivity to light Sensitivity to noise  2- Cognitive Symptoms  Difficulties with attention Memory problems Loss of focus Difficulty multitasking Difficulty completing mental tasks  3- Sleep Symptoms  Sleeping more than usual Sleeping less than usual Having trouble falling asleep  4- Emotional Symptoms  Anxiety Depression Panic attacks  5 - Vestibular Symptoms  The vestibular system is affected in nearly 60% of youth and adolescent athletes following a concussion.  But what is the vestibular system?  It's the sensory system that helps with your sense of balance and spatial orientation. Think of a gyroscope! With help from your inner ears, your vestibular system detects the motion or position of your head in space. It sends information to your brain that's needed for balance and stable vision.  Need an example? If you're moving and looking  at moving objects at the same time (think riding in a car), you're able to stay focused and not lose visual clarity. During a vestibular concussion, your gyroscope isn't working at full potential.  Difficulty with balance Dizziness. It may feel like the room is spinning or a slow, wavy sensation. (Like you're on a boat!) Trouble stabilizing vision when moving your head. (We call this Vestibular-Ocular Reflux, or VOR.) Think back to riding in a car. With a vestibular concussion, you can't stay focused. (The technical name for this is Visual Motion Sensitivity, or VMS.) Triggers  These 3 things could bring on vestibular symptoms:  Dynamic movements Busy environments, like the grocery store Crowds  6 - Oculo-motor  A concussion that affects the ocular, or visual, system of the brain. Typically, patients with ocular-motor concussions report pressure headaches in the front of their head, feeling more tired than normal, and becoming more symptomatic doing math or science exercises at school.  Patients also experience difficulties with their eyes working together. Follow along to explore some of the most common symptoms.  Convergence  The eyes converge when viewing objects up close, such as with reading. With convergence problems, patients may see a double image as a target moves closer to them. Typically, without a concussion, objects can be brought very close without doubling.  Accommodation  Accommodation problems cause an object to become blurry as it is viewed up close. Accommodative and convergence problems are often experienced together and can impact reading and other near-vision activities.  Pursuits and Saccades  The eyes use pursuit eye movements to follow objects; while saccade eyes movements allow the eyes to shift rapidly from one object  to another. Tracking objects, reading a book, scrolling on a computer or even watching for a moving car while crossing the street can be  difficult when patients have problems with pursuit and saccade eye movements.  Misalignment  When people with eye misalignment (one eye drifts, eyes aren't perfectly aligned) sustain a concussion, the brain may have difficulty compensating for the misalignment like it once did. This can result in blurry vision, difficulty taking notes in class and focusing on the chalkboard.  Note: This is not an exhaustive list of concussion signs and symptoms, and it may take a few days for concussion symptoms to appear after the initial injury.   Follow-up: 1 week.  Signed,  Maud Deed. Miracle Criado, MD   Outpatient Encounter Medications as of 09/15/2018  Medication Sig  . ALAYCEN 1/35 tablet TAKE 1 TABLET BY MOUTH DAILY  . escitalopram (LEXAPRO) 20 MG tablet Take 20 mg by mouth daily.  . Diclofenac Sodium (PENNSAID) 2 % SOLN Place onto the skin.  . [DISCONTINUED] predniSONE (DELTASONE) 10 MG tablet 3 tabs by mouth x 3 days, 2 tabs by mouth x 2 days, 1 tab by mouth x 2 days and stop.   No facility-administered encounter medications on file as of 09/15/2018.

## 2018-09-15 NOTE — Patient Instructions (Signed)
Mild Traumatic Brain Injury (CONCUSSSION):  Autumn Loffler, MD, [facility] Adapted from [place], [employer], and [employer]  Think of the human brain almost as an egg yolk and your skull as an egg shell. When your head or body takes a hit, it can cause your brain to shake around inside your skull, injuring the brain. A concussion is not only caused by a hit to the head, but can also be caused by an impact to the body that ends up shaking your brain in your skull, such as whiplash.  A common misconception about concussion is that one loses consciousness. However, loss of consciousness occurs in only less than 10% of concussion cases.  Concussive symptoms typically resolve in 7 to 10 days (sports-related concussions) or within 3 months (non-athletes).  Approximately 20% of people have symptoms after 6 weeks.  With concussions, we have learned that it generally takes youth longer to recover from concussions. Another thing that we now know about concussions is that it usually takes female athletes longer to recover than female athletes.  No two concussions are identical. In fact, there are at least six different clinical trajectories that concussions may take.  TREATMENT:  THE MOST IMPORTANT THING IS REST AS SOON AS POSSIBLE AFTER INJURY SO THAT THE BRAIN CAN RECOVER. COMPLETE PHYSICAL AND MENTAL REST ARE NEEDED INITIALLY.   THAT MEANS: NO SCHOOL OR WORK FOR AT LEAST 3 DAYS AND CLEARED BY YOUR DOCTOR NO MENTAL EXERTION, MEANING NO WORK, NO HOMEWORK, NO TEST TAKING.  Avoid situations with loud noise, bright lights, or crowds. However, this doesn't mean isolate yourself in a dark room for a week. Too much isolation and boredom can be harmful, contributing to feelings of anxiety, depression, and resulting in increased recovery time. Spend time with friends and family, but monitor your symptoms and avoid situations that make you feel worse.   NO VIDEO  GAMES, NO USING THE COMPUTER, NO TEXTING, NO USING SMARTPHONES, NO USE OF AN IPAD OR TABLET. DO NOT GO TO A MOVIE THEATRE OR WATCH SPORTS ON TV. HDTV TENDS TO MAKE PEOPLE FEEL WORSE.   ON APPROXIMATELY DAY 4, BEGIN VERY LIGHT EXERCISE SUCH AS WALKING. DO NOT START ANY STRENUOUS EXERCISE.   You will be given return to school or return to work recommendations.   Signs and Symptoms of Concussion:  The signs and symptoms of a concussion are incredibly important because a concussion doesn't show up on imaging like an x-ray, CT, or MRI scan and there is no objective test, like a blood or saliva test, that can determine if a patient has a concussion. A doctor makes a concussion diagnosis based on the results of a comprehensive examination, which includes observing signs of concussion and patients reporting symptoms of concussion appearing after an impact to the head or body. Concussion signs and symptoms are the brain's way of showing it is injured and not functioning normally.  CONCUSSION SIGNS  Concussion signs are what someone could observe about you to determine if you have a concussion.   Common concussion signs include:  Loss of consciousness Problems with balance Glazed look in the eyes Amnesia Delayed response to questions Forgetting an instruction, confusion about an assignment or position, or  confusion of the game, score, or opponent Inappropriate crying Inappropriate laughter Vomiting  CONCUSSION SYMPTOMS  Concussion symptoms are what someone who is concussed will tell you that they are experiencing. Concussion symptoms typically fall into six major categories:  1- Somatic (  Physical) Symptoms  Headache Light-headedness Dizziness Nausea Sensitivity to light Sensitivity to noise  2- Cognitive Symptoms  Difficulties with attention Memory problems Loss of focus Difficulty multitasking Difficulty completing mental tasks  3- Sleep Symptoms  Sleeping more than  usual Sleeping less than usual Having trouble falling asleep  4- Emotional Symptoms  Anxiety Depression Panic attacks  5 - Vestibular Symptoms  The vestibular system is affected in nearly 60% of youth and adolescent athletes following a concussion.  But what is the vestibular system?  It's the sensory system that helps with your sense of balance and spatial orientation. Think of a gyroscope! With help from your inner ears, your vestibular system detects the motion or position of your head in space. It sends information to your brain that's needed for balance and stable vision.  Need an example? If you're moving and looking at moving objects at the same time (think riding in a car), you're able to stay focused and not lose visual clarity. During a vestibular concussion, your gyroscope isn't working at full potential.  Difficulty with balance Dizziness. It may feel like the room is spinning or a slow, wavy sensation. (Like you're on a boat!) Trouble stabilizing vision when moving your head. (We call this Vestibular-Ocular Reflux, or VOR.) Think back to riding in a car. With a vestibular concussion, you can't stay focused. (The technical name for this is Visual Motion Sensitivity, or VMS.) Triggers  These 3 things could bring on vestibular symptoms:  Dynamic movements Busy environments, like the grocery store Crowds  6 - Oculo-motor  A concussion that affects the ocular, or visual, system of the brain. Typically, patients with ocular-motor concussions report pressure headaches in the front of their head, feeling more tired than normal, and becoming more symptomatic doing math or science exercises at school.  Patients also experience difficulties with their eyes working together. Follow along to explore some of the most common symptoms.  Convergence  The eyes converge when viewing objects up close, such as with reading. With convergence problems, patients may see a double  image as a target moves closer to them. Typically, without a concussion, objects can be brought very close without doubling.  Accommodation  Accommodation problems cause an object to become blurry as it is viewed up close. Accommodative and convergence problems are often experienced together and can impact reading and other near-vision activities.  Pursuits and Saccades  The eyes use pursuit eye movements to follow objects; while saccade eyes movements allow the eyes to shift rapidly from one object to another. Tracking objects, reading a book, scrolling on a computer or even watching for a moving car while crossing the street can be difficult when patients have problems with pursuit and saccade eye movements.  Misalignment  When people with eye misalignment (one eye drifts, eyes aren't perfectly aligned) sustain a concussion, the brain may have difficulty compensating for the misalignment like it once did. This can result in blurry vision, difficulty taking notes in class and focusing on the chalkboard.  Note: This is not an exhaustive list of concussion signs and symptoms, and it may take a few days for concussion symptoms to appear after the initial injury.

## 2018-09-17 ENCOUNTER — Telehealth: Payer: Self-pay | Admitting: Family Medicine

## 2018-09-17 NOTE — Telephone Encounter (Signed)
fmla paperwork in Dr Copland's in box

## 2018-09-17 NOTE — Telephone Encounter (Signed)
Noted and will complete

## 2018-09-18 ENCOUNTER — Telehealth: Payer: Self-pay | Admitting: Dietician

## 2018-09-18 NOTE — Telephone Encounter (Signed)
Called patient to confirm or reschedule her appointment for 09/24/18. She reports having had an injury and is unable to keep the appointment. She will come on 10/29/18 as scheduled, and will schedule her third appointment at that time.

## 2018-09-22 ENCOUNTER — Encounter: Payer: Self-pay | Admitting: Family Medicine

## 2018-09-22 ENCOUNTER — Ambulatory Visit: Payer: 59 | Admitting: Family Medicine

## 2018-09-22 ENCOUNTER — Other Ambulatory Visit: Payer: Self-pay

## 2018-09-22 VITALS — BP 104/72 | HR 96 | Temp 98.7°F | Ht 70.0 in | Wt 232.5 lb

## 2018-09-22 DIAGNOSIS — S0990XD Unspecified injury of head, subsequent encounter: Secondary | ICD-10-CM | POA: Diagnosis not present

## 2018-09-22 DIAGNOSIS — S060X0D Concussion without loss of consciousness, subsequent encounter: Secondary | ICD-10-CM

## 2018-09-22 NOTE — Progress Notes (Signed)
Autumn Ferre T. Marcellino Fidalgo, MD Primary Care and [place] at [facility] [place], [address] Phone: [phone]  FAX: [phone]  Autumn [name] - 33 y.o. female  MRN [id number]  Date of Birth: 05-19-1986  Visit Date: 09/22/2018  PCP: Autumn Passy, MD  Referred by: Autumn Passy, MD  Chief Complaint  Patient presents with  . Follow-up    Concussion   Subjective:   Autumn [name] is a 33 y.o. very pleasant female patient who presents with the following:  09/14/2018 DOI  Well-known patient presents for concussion follow-up.  Globally, she is doing quite a bit better than her office visit 1 week ago.  Symptom checklist includes positive for some mild headache, pressure in the head, as well as some nausea and dizziness.  She also has some fatigue and low energy.  She does note that with motion of the head she will get some nausea and head pressure that worsened some.  She also drove to our office and when sitting in the waiting room, the busy background made her feel quite a bit worse and have some disequilibrium as well as some worsened nausea, but this settled when she was sitting in the examination room.  Busy background still causing some nausea and  Some headache.  FMLA - ? RTW on next Tuesday.  Busy background, driving  Past Medical History, Surgical History, Social History, Family History, Problem List, Medications, and Allergies have been reviewed and updated if relevant.  Patient Active Problem List   Diagnosis Date Noted  . Myalgia 08/21/2018  . Shingles 06/20/2018  . Wrist pain, acute, right 05/20/2018  . Neck pain 04/16/2018  . Nonallopathic lesion of cervical region 04/16/2018  . Nonallopathic lesion of rib cage 04/16/2018  . Nonallopathic lesion of thoracic region 04/16/2018  . Right lower quadrant abdominal pain 10/28/2017  . Chest pain 09/02/2017  . Epigastric discomfort 09/02/2017  .  Contraception management 08/30/2016  . Family history of breast cancer in mother 08/30/2016  . Obesity (BMI 30.0-34.9) 08/19/2014  . Amenorrhea 12/25/2013  . Anxiety and depression 07/15/2012  . Screening for STD (sexually transmitted disease) 01/10/2012    Past Medical History:  Diagnosis Date  . Anxiety   . Back pain    chronic  . Depression   . Disc disease, degenerative, lumbar or lumbosacral   . History of ovarian cyst   . Migraine   . Scoliosis     Past Surgical History:  Procedure Laterality Date  . APPENDECTOMY    . BREAST SURGERY Right 2008   lump/ benign    Social History   Socioeconomic History  . Marital status: Married    Spouse name: Not on file  . Number of children: 1  . Years of education: Not on file  . Highest education level: Not on file  Occupational History  . Occupation: CMA  Social Needs  . Financial resource strain: Not on file  . Food insecurity:    Worry: Not on file    Inability: Not on file  . Transportation needs:    Medical: Not on file    Non-medical: Not on file  Tobacco Use  . Smoking status: Former Smoker    Types: Cigarettes    Last attempt to quit: 08/18/2016    Years since quitting: 2.0  . Smokeless tobacco: Never Used  . Tobacco comment: 3 cigarettes a day  Substance and Sexual Activity  . Alcohol  use: Yes    Alcohol/week: 0.0 standard drinks    Comment: occasional  . Drug use: No  . Sexual activity: Yes    Birth control/protection: Pill  Lifestyle  . Physical activity:    Days per week: Not on file    Minutes per session: Not on file  . Stress: Not on file  Relationships  . Social connections:    Talks on phone: Not on file    Gets together: Not on file    Attends religious service: Not on file    Active member of club or organization: Not on file    Attends meetings of clubs or organizations: Not on file    Relationship status: Not on file  . Intimate partner violence:    Fear of current or ex partner:  Not on file    Emotionally abused: Not on file    Physically abused: Not on file    Forced sexual activity: Not on file  Other Topics Concern  . Not on file  Social History Narrative  . Not on file    Family History  Problem Relation Age of Onset  . Cancer Mother 42       breast  . Breast cancer Mother 62       and 59  . Alcohol abuse Father   . Vision loss Maternal Grandmother   . Heart disease Maternal Grandmother   . Heart failure Maternal Grandmother   . Cancer Maternal Grandfather   . Alcohol abuse Paternal Grandfather   . Arthritis Maternal Aunt     No Known Allergies  Medication list reviewed and updated in full in [place].   GEN: No acute illnesses, no fevers, chills. GI: No n/v/d, eating normally Pulm: No SOB Interactive and getting along well at home.  Otherwise, ROS is as per the HPI.  Objective:   BP 104/72   Pulse 96   Temp 98.7 F (37.1 C) (Oral)   Ht 5\' 10"  (1.778 m)   Wt 232 lb 8 oz (105.5 kg)   BMI 33.36 kg/m   GEN: WDWN, NAD, Non-toxic, A & O x 3 HEENT: Atraumatic, Normocephalic. Neck supple. No masses, No LAD. Ears and Nose: No external deformity. EXTR: No c/c/e NEURO Normal gait.  PSYCH: Normally interactive. Conversant. Not depressed or anxious appearing.  Calm demeanor.   Neuro: CN 2-12 grossly intact. PERRLA. EOMI. Sensation intact throughout. Str 5/5 all extremities. DTR 2+. No clonus. A and o x 4. Romberg neg. Finger nose neg. Heel -shin neg.   Immediate memory, 15 out of 15. Concentration 4 out of 5 Full neck range of motion with no pain.  Bess testing, double leg stance, normal Single leg stance, normal Tandem stance, normal.  Heel-to-toe walking is normal.  Laboratory and Imaging Data:  Assessment and Plan:   Concussion without loss of consciousness, subsequent encounter  Closed head injury, subsequent encounter  >25 minutes spent in face to face time with patient, >50% spent in counselling or coordination  of care: She is doing quite a bit better compared to 1 week ago.  She is still having some symptoms, particularly with a busy background, some headache and nausea which worsens with activity.  At this point I do not think that she should return to work, which would exacerbate her symptoms and potentially prolong recovery.  I am going to complete her FMLA paperwork, with a probable return to work of September 30, 2018.  This is of course influx with  a closed head injury, and it may change.  Follow-up: f/u 1 week  Signed,  Darrol Brandenburg T. Wesleigh Markovic, MD   Outpatient Encounter Medications as of 09/22/2018  Medication Sig  . ALAYCEN 1/35 tablet TAKE 1 TABLET BY MOUTH DAILY  . Diclofenac Sodium (PENNSAID) 2 % SOLN Place onto the skin.  Marland Kitchen escitalopram (LEXAPRO) 20 MG tablet Take 20 mg by mouth daily.   No facility-administered encounter medications on file as of 09/22/2018.

## 2018-09-24 ENCOUNTER — Ambulatory Visit: Payer: Self-pay | Admitting: Dietician

## 2018-09-24 ENCOUNTER — Telehealth: Payer: Self-pay | Admitting: Family Medicine

## 2018-09-24 NOTE — Telephone Encounter (Signed)
I cannot complete until evaluation next week.

## 2018-09-24 NOTE — Telephone Encounter (Signed)
Fitness for duty/return to work release form in dr copland's in box

## 2018-09-26 ENCOUNTER — Ambulatory Visit: Payer: Self-pay | Admitting: Family Medicine

## 2018-09-29 ENCOUNTER — Ambulatory Visit: Payer: 59 | Admitting: Family Medicine

## 2018-09-29 ENCOUNTER — Telehealth: Payer: Self-pay

## 2018-09-29 ENCOUNTER — Encounter: Payer: Self-pay | Admitting: Family Medicine

## 2018-09-29 ENCOUNTER — Other Ambulatory Visit: Payer: Self-pay

## 2018-09-29 VITALS — BP 104/70 | HR 93 | Temp 98.1°F | Ht 70.0 in | Wt 231.8 lb

## 2018-09-29 DIAGNOSIS — S060X0D Concussion without loss of consciousness, subsequent encounter: Secondary | ICD-10-CM | POA: Diagnosis not present

## 2018-09-29 NOTE — Progress Notes (Signed)
Autumn [name] T. Marleigh Kaylor, MD Primary Care and [place] at [facility] [place], [address] Phone: [phone]  FAX: [phone]  Autumn [name] - 33 y.o. female  MRN [id number]  Date of Birth: [birth year]  Visit Date: 09/29/2018  PCP: Lucille Passy, MD  Referred by: Lucille Passy, MD  Chief Complaint  Patient presents with  . Follow-up    Concussion   Subjective:   LAASIA ARCOS is a 33 y.o. very pleasant female patient who presents with the following:  DOI 09/14/2018  She is doing very well, she has a 0 on her symptom checklist and she thinks she is at least 95% or more better.  The rest of her scat 5 is essentially unremarkable and she does quite well with no errors.  Past Medical History, Surgical History, Social History, Family History, Problem List, Medications, and Allergies have been reviewed and updated if relevant.  Patient Active Problem List   Diagnosis Date Noted  . Myalgia 08/21/2018  . Shingles 06/20/2018  . Wrist pain, acute, right 05/20/2018  . Neck pain 04/16/2018  . Nonallopathic lesion of cervical region 04/16/2018  . Nonallopathic lesion of rib cage 04/16/2018  . Nonallopathic lesion of thoracic region 04/16/2018  . Right lower quadrant abdominal pain 10/28/2017  . Chest pain 09/02/2017  . Epigastric discomfort 09/02/2017  . Contraception management 08/30/2016  . Family history of breast cancer in mother 08/30/2016  . Obesity (BMI 30.0-34.9) 08/19/2014  . Amenorrhea 12/25/2013  . Anxiety and depression 07/15/2012  . Screening for STD (sexually transmitted disease) 01/10/2012    Past Medical History:  Diagnosis Date  . Anxiety   . Back pain    chronic  . Depression   . Disc disease, degenerative, lumbar or lumbosacral   . History of ovarian cyst   . Migraine   . Scoliosis     Past Surgical History:  Procedure Laterality Date  . APPENDECTOMY    . BREAST SURGERY Right  2008   lump/ benign    Social History   Socioeconomic History  . Marital status: Married    Spouse name: Not on file  . Number of children: 1  . Years of education: Not on file  . Highest education level: Not on file  Occupational History  . Occupation: CMA  Social Needs  . Financial resource strain: Not on file  . Food insecurity:    Worry: Not on file    Inability: Not on file  . Transportation needs:    Medical: Not on file    Non-medical: Not on file  Tobacco Use  . Smoking status: Former Smoker    Types: Cigarettes    Last attempt to quit: 08/18/2016    Years since quitting: 2.1  . Smokeless tobacco: Never Used  . Tobacco comment: 3 cigarettes a day  Substance and Sexual Activity  . Alcohol use: Yes    Alcohol/week: 0.0 standard drinks    Comment: occasional  . Drug use: No  . Sexual activity: Yes    Birth control/protection: Pill  Lifestyle  . Physical activity:    Days per week: Not on file    Minutes per session: Not on file  . Stress: Not on file  Relationships  . Social connections:    Talks on phone: Not on file    Gets together: Not on file    Attends religious service: Not on file    Active member of  club or organization: Not on file    Attends meetings of clubs or organizations: Not on file    Relationship status: Not on file  . Intimate partner violence:    Fear of current or ex partner: Not on file    Emotionally abused: Not on file    Physically abused: Not on file    Forced sexual activity: Not on file  Other Topics Concern  . Not on file  Social History Narrative  . Not on file    Family History  Problem Relation Age of Onset  . Cancer Mother 84       breast  . Breast cancer Mother 26       and 61  . Alcohol abuse Father   . Vision loss Maternal Grandmother   . Heart disease Maternal Grandmother   . Heart failure Maternal Grandmother   . Cancer Maternal Grandfather   . Alcohol abuse Paternal Grandfather   . Arthritis Maternal  Aunt     No Known Allergies  Medication list reviewed and updated in full in [place].   GEN: No acute illnesses, no fevers, chills. GI: No n/v/d, eating normally Pulm: No SOB Interactive and getting along well at home.  Otherwise, ROS is as per the HPI.  Objective:   BP 104/70   Pulse 93   Temp 98.1 F (36.7 C) (Oral)   Ht 5\' 10"  (1.778 m)   Wt 231 lb 12 oz (105.1 kg)   BMI 33.25 kg/m   GEN: WDWN, NAD, Non-toxic, A & O x 3 HEENT: Atraumatic, Normocephalic. Neck supple. No masses, No LAD. Ears and Nose: No external deformity. EXTR: No c/c/e NEURO Normal gait.  PSYCH: Normally interactive. Conversant. Not depressed or anxious appearing.  Calm demeanor.   Neuro: CN 2-12 grossly intact. PERRLA. EOMI. Sensation intact throughout. Str 5/5 all extremities. DTR 2+. No clonus. A and o x 4. Romberg neg. Finger nose neg. Heel -shin neg.   Balance 1 foot, closed eyes unsteady Tandem, normal  PSYCH: Normally interactive. Conversant. Not depressed or anxious appearing.  Calm demeanor.     Laboratory and Imaging Data:  Assessment and Plan:   Concussion without loss of consciousness, subsequent encounter  Doing well, approaching 100%.  Return to work full length of workday with 15-minute break every 45 minutes.  Follow-up: No follow-ups on file.  Signed,  Maud Deed. Dazha Kempa, MD   Outpatient Encounter Medications as of 09/29/2018  Medication Sig  . ALAYCEN 1/35 tablet TAKE 1 TABLET BY MOUTH DAILY  . Diclofenac Sodium (PENNSAID) 2 % SOLN Place onto the skin.  Marland Kitchen escitalopram (LEXAPRO) 20 MG tablet Take 20 mg by mouth daily.   No facility-administered encounter medications on file as of 09/29/2018.

## 2018-09-29 NOTE — Telephone Encounter (Signed)
Pt wanted to know process for ordering maintenance med thru [place]. Advised would need to contact [facility] at [phone]. Pt voiced understanding.

## 2018-10-27 ENCOUNTER — Encounter: Payer: Self-pay | Admitting: Family Medicine

## 2018-10-27 ENCOUNTER — Other Ambulatory Visit: Payer: Self-pay

## 2018-10-27 DIAGNOSIS — F419 Anxiety disorder, unspecified: Secondary | ICD-10-CM

## 2018-10-27 DIAGNOSIS — Z01419 Encounter for gynecological examination (general) (routine) without abnormal findings: Secondary | ICD-10-CM

## 2018-10-27 DIAGNOSIS — E669 Obesity, unspecified: Secondary | ICD-10-CM

## 2018-10-27 DIAGNOSIS — Z803 Family history of malignant neoplasm of breast: Secondary | ICD-10-CM

## 2018-10-27 DIAGNOSIS — Z793 Long term (current) use of hormonal contraceptives: Secondary | ICD-10-CM

## 2018-10-27 DIAGNOSIS — F329 Major depressive disorder, single episode, unspecified: Secondary | ICD-10-CM

## 2018-10-27 NOTE — Progress Notes (Signed)
Lab orders entered for CPE: CMP/CBC w Diff/Lipid/A1C/[facility] Hereditary Comprehensive Cancer Panel/per pt "I called insurance and they said it should be covered since my mom was diagnosed with triple negative breast cancer"/thx dmf

## 2018-10-29 ENCOUNTER — Encounter: Payer: Self-pay | Admitting: Dietician

## 2018-10-29 ENCOUNTER — Encounter: Payer: Self-pay | Admitting: *Deleted

## 2018-10-29 ENCOUNTER — Encounter: Payer: 59 | Attending: Family Medicine | Admitting: Dietician

## 2018-10-29 VITALS — Ht 69.75 in | Wt 233.6 lb

## 2018-10-29 DIAGNOSIS — Z713 Dietary counseling and surveillance: Secondary | ICD-10-CM

## 2018-10-29 NOTE — Progress Notes (Signed)
Medical Nutrition Therapy: Visit start time: 1630  end time: 1700  Assessment:  Diagnosis: overweight Medical history changes: no changes Psychosocial issues/ stress concerns: increased stress in recent weeks  Current weight: 233.6lbs  Height: 5'9.75" Medications, supplement changes: reconciled list in medical record  Progress and evaluation:   patient reports likely some larger food portions, more sweets recently   Increased stress with busy work hours, mother with recent breast cancer diagnosis  Was home for 2 weeks after injury and was losing some weight, but has regained due to stress, some stress eating.  Physical activity: none recently, hopes to resume soon  Dietary Intake:  Usual eating pattern includes 2 meals and 2 snacks per day. Dining out frequency: 1 meals per week.  Breakfast: 1c coffee with creamer, sometimes peanut butter toast Snack: [place] granola bar; or nuts; yogurt smoothie; orange or banana; cottage cheese with fruit; occ none due to busy schedule; ate mini sausage biscuits for several days Lunch: leftovers, salad Snack: none Supper: grilled pork chop with broccoli or asparagus + rice or potato; salad Snack: recently ice cream cone Beverages: water, sparkling water, 1c. Coffee, occasionally few sips of regular soda  Nutrition Care Education: Topics covered: weight control Basic nutrition: appropriate nutrient balance, appropriate meal and snack schedule, general nutrition guidelines    Weight control: managing hunger and appetite; effects of stress; role of physical activity   Nutritional Diagnosis:  [place]-3.3 Overweight/obesity As related to excess calories, inactivity, stress.  As evidenced by patient with current BMI of 33.76, and patient report of diet and lifestyle history.  Intervention:   Discussion as noted above.  Encouraged increasing caloric intake earlier in the day to control hunger later in the day.  Discussed options for exercise.    Updated goals with input from patient.  Education Materials given:  Marland Kitchen Goals/ instructions   Learner/ who was taught:  . Patient   Level of understanding: Marland Kitchen Verbalizes/ demonstrates competency   Demonstrated degree of understanding via:   Teach back Learning barriers: . None  Willingness to learn/ readiness for change: . Eager, change in progress   Monitoring and Evaluation:  Dietary intake, exercise, and body weight      follow up: 12/04/18

## 2018-10-29 NOTE — Patient Instructions (Signed)
   Gradually increase physical activity/ exercise. Start with a few minutes and gradually increase time and frequency.   Try increasing morning snack or having a small amount of food with coffee at breakfast to boost calories earlier in the day and better control hunger/ appetite in the evening.   Make sure to include some protein and a small amount of carb (whole grain crackers or bread, fruit, beans) when eating a salad for a meal.

## 2018-10-30 ENCOUNTER — Other Ambulatory Visit: Payer: Self-pay

## 2018-11-05 ENCOUNTER — Other Ambulatory Visit (INDEPENDENT_AMBULATORY_CARE_PROVIDER_SITE_OTHER): Payer: 59

## 2018-11-05 DIAGNOSIS — E669 Obesity, unspecified: Secondary | ICD-10-CM | POA: Diagnosis not present

## 2018-11-05 DIAGNOSIS — Z01419 Encounter for gynecological examination (general) (routine) without abnormal findings: Secondary | ICD-10-CM

## 2018-11-05 DIAGNOSIS — F329 Major depressive disorder, single episode, unspecified: Secondary | ICD-10-CM | POA: Diagnosis not present

## 2018-11-05 DIAGNOSIS — Z793 Long term (current) use of hormonal contraceptives: Secondary | ICD-10-CM | POA: Diagnosis not present

## 2018-11-05 DIAGNOSIS — F419 Anxiety disorder, unspecified: Secondary | ICD-10-CM | POA: Diagnosis not present

## 2018-11-05 DIAGNOSIS — Z803 Family history of malignant neoplasm of breast: Secondary | ICD-10-CM | POA: Diagnosis not present

## 2018-11-05 LAB — LIPID PANEL
Cholesterol: 174 mg/dL (ref 0–200)
HDL: 53.8 mg/dL (ref >39.00)
LDL Cholesterol: 92 mg/dL (ref 0–99)
NonHDL: 120.18
Total CHOL/HDL Ratio: 3
Triglycerides: 139 mg/dL (ref 0.0–149.0)
VLDL: 27.8 mg/dL (ref 0.0–40.0)

## 2018-11-05 LAB — CBC WITH DIFFERENTIAL/PLATELET
Basophils Absolute: 0.1 10*3/uL (ref 0.0–0.1)
Basophils Relative: 0.9 % (ref 0.0–3.0)
Eosinophils Absolute: 0.2 10*3/uL (ref 0.0–0.7)
Eosinophils Relative: 3.3 % (ref 0.0–5.0)
HCT: 41.4 % (ref 36.0–46.0)
Hemoglobin: 14.4 g/dL (ref 12.0–15.0)
Lymphocytes Relative: 43 % (ref 12.0–46.0)
Lymphs Abs: 2.6 10*3/uL (ref 0.7–4.0)
MCHC: 34.6 g/dL (ref 30.0–36.0)
MCV: 90.2 fl (ref 78.0–100.0)
Monocytes Absolute: 0.4 10*3/uL (ref 0.1–1.0)
Monocytes Relative: 5.8 % (ref 3.0–12.0)
Neutro Abs: 2.8 10*3/uL (ref 1.4–7.7)
Neutrophils Relative %: 47 % (ref 43.0–77.0)
Platelets: 292 10*3/uL (ref 150.0–400.0)
RBC: 4.59 Mil/uL (ref 3.87–5.11)
RDW: 12.6 % (ref 11.5–15.5)
WBC: 6 10*3/uL (ref 4.0–10.5)

## 2018-11-05 LAB — COMPREHENSIVE METABOLIC PANEL
ALT: 22 U/L (ref 0–35)
AST: 18 U/L (ref 0–37)
Albumin: 4.2 g/dL (ref 3.5–5.2)
Alkaline Phosphatase: 52 U/L (ref 39–117)
BUN: 9 mg/dL (ref 6–23)
CO2: 28 mEq/L (ref 19–32)
Calcium: 9.2 mg/dL (ref 8.4–10.5)
Chloride: 102 mEq/L (ref 96–112)
Creatinine, Ser: 0.81 mg/dL (ref 0.40–1.20)
GFR: 81.6 mL/min (ref >60.00)
Glucose, Bld: 91 mg/dL (ref 70–99)
Potassium: 4.1 mEq/L (ref 3.5–5.1)
Sodium: 138 mEq/L (ref 135–145)
Total Bilirubin: 0.5 mg/dL (ref 0.2–1.2)
Total Protein: 7.3 g/dL (ref 6.0–8.3)

## 2018-11-05 LAB — HEMOGLOBIN A1C: Hgb A1c MFr Bld: 5.9 % (ref 4.6–6.5)

## 2018-11-06 ENCOUNTER — Ambulatory Visit: Payer: 59

## 2018-11-08 ENCOUNTER — Telehealth: Payer: 59 | Admitting: Nurse Practitioner

## 2018-11-08 DIAGNOSIS — N3000 Acute cystitis without hematuria: Secondary | ICD-10-CM | POA: Diagnosis not present

## 2018-11-08 MED ORDER — CEPHALEXIN 500 MG PO CAPS: 500.0000 mg | ORAL_CAPSULE | Freq: Two times a day (BID) | ORAL | 0 refills | Status: DC

## 2018-11-08 NOTE — Progress Notes (Signed)

## 2018-11-11 ENCOUNTER — Telehealth: Payer: Self-pay

## 2018-11-11 NOTE — Telephone Encounter (Signed)
Questions for Screening COVID-19  Symptom onset: None  Travel or Contacts: None  During this illness, did/does the patient experience any of the following symptoms? Fever >100.56F []   Yes [x]   No []   Unknown Subjective fever (felt feverish) []   Yes [x]   No []   Unknown Chills []   Yes [x]   No []   Unknown Muscle aches (myalgia) []   Yes [x]   No []   Unknown Runny nose (rhinorrhea) []   Yes [x]   No []   Unknown Sore throat []   Yes [x]   No []   Unknown Cough (new onset or worsening of chronic cough) []   Yes [x]   No []   Unknown Shortness of breath (dyspnea) []   Yes [x]   No []   Unknown Nausea or vomiting []   Yes [x]   No []   Unknown Headache []   Yes [x]   No []   Unknown Abdominal pain  []   Yes [x]   No []   Unknown Diarrhea (?3 loose/looser than normal stools/24hr period) []   Yes [x]   No []   Unknown Other, specify:  Patient risk factors: Smoker? []   Current [x]   Former []   Never If female, currently pregnant? []   Yes [x]   No  Patient Active Problem List   Diagnosis Date Noted  . Myalgia 08/21/2018  . Shingles 06/20/2018  . Wrist pain, acute, right 05/20/2018  . Neck pain 04/16/2018  . Nonallopathic lesion of cervical region 04/16/2018  . Nonallopathic lesion of rib cage 04/16/2018  . Nonallopathic lesion of thoracic region 04/16/2018  . Right lower quadrant abdominal pain 10/28/2017  . Chest pain 09/02/2017  . Epigastric discomfort 09/02/2017  . Contraception management 08/30/2016  . Family history of breast cancer in mother 08/30/2016  . Obesity (BMI 30.0-34.9) 08/19/2014  . Amenorrhea 12/25/2013  . Anxiety and depression 07/15/2012  . Screening for STD (sexually transmitted disease) 01/10/2012    Plan:  []   High risk for COVID-19 with red flags go to ED (with CP, SOB, weak/lightheaded, or fever > 101.5). Call ahead.  []   High risk for COVID-19 but stable. Inform provider and coordinate time for [facility] visit.   []   No red flags but URI signs or symptoms okay for [facility] visit.

## 2018-11-11 NOTE — Progress Notes (Signed)
Subjective:   Patient ID: Autumn [name], female    DOB: [birth year], 33 y.o.   MRN: [id number]  Autumn [name] is a pleasant 33 y.o. year old female who presents to clinic today with Annual Exam (Patient is here today for a CPE with PAP. She is due for Tdap.  She had fasting labs completed. She is being Tx for UTI with Keflex. She agrees to PAP without any additional testing. Agrees to get Tdap.)  on 11/12/2018  HPI:  Here for CPX with pap.  Already had fasting labs done. Sexually active with her husband- on Alaycen 1/35 for contraception.  Due for Tdap.  Health Maintenance  Topic Date Due  . TETANUS/TDAP  03/09/2005  . PAP SMEAR-Modifier  08/30/2018  . INFLUENZA VACCINE  01/10/2019  . HIV Screening  Completed     Lab Results  Component Value Date   HGBA1C 5.9 11/05/2018   Lab Results  Component Value Date   WBC 6.0 11/05/2018   HGB 14.4 11/05/2018   HCT 41.4 11/05/2018   MCV 90.2 11/05/2018   PLT 292.0 11/05/2018   Lab Results  Component Value Date   ALT 22 11/05/2018   AST 18 11/05/2018   ALKPHOS 52 11/05/2018   BILITOT 0.5 11/05/2018   Lab Results  Component Value Date   NA 138 11/05/2018   K 4.1 11/05/2018   CL 102 11/05/2018   CO2 28 11/05/2018    Has been followed by Dr. Lorelei Pont since she sustained a concussion after falling of a deck and hitting her head.  Anxiety/depression- currently taking lexapro 20 mg daily.  GAD 7 : Generalized Anxiety Score 10/28/2017  Nervous, Anxious, on Edge 1  Control/stop worrying 1  Worry too much - different things 1  Trouble relaxing 1  Restless 0  Easily annoyed or irritable 0  Afraid - awful might happen 0  Total GAD 7 Score 4  Anxiety Difficulty Not difficult at all    Depression screen [facility] 2/9 11/12/2018 10/28/2017 06/19/2017 08/30/2016 08/30/2015  Decreased Interest 0 0 1 0 0  Down, Depressed, Hopeless 0 0 1 0 0  PHQ - 2 Score 0 0 2 0 0  Altered sleeping - 1 2 1  -  Tired, decreased energy - 1 2 0 -   Change in appetite - 1 1 0 -  Feeling bad or failure about yourself  - 0 0 0 -  Trouble concentrating - 0 1 0 -  Moving slowly or fidgety/restless - 0 1 0 -  Suicidal thoughts - 0 0 0 -  PHQ-9 Score - 3 9 1  -  Difficult doing work/chores - Not difficult at all Somewhat difficult Not difficult at all -   Current Outpatient Medications on File Prior to Visit  Medication Sig Dispense Refill  . aspirin-acetaminophen-caffeine (EXCEDRIN MIGRAINE) 250-250-65 MG tablet Take by mouth every 6 (six) hours as needed for headache.    . cephALEXin (KEFLEX) 500 MG capsule Take 1 capsule (500 mg total) by mouth 2 (two) times daily. 14 capsule 0  . ibuprofen (ADVIL) 200 MG tablet Take 200 mg by mouth every 6 (six) hours as needed.     No current facility-administered medications on file prior to visit.     Allergies  Allergen Reactions  . Imitrex [Sumatriptan] Other (See Comments)    Vomiting, Slurred Speech and Facial Numbness    Past Medical History:  Diagnosis Date  . Anxiety   . Back pain    chronic  .  Depression   . Disc disease, degenerative, lumbar or lumbosacral   . History of ovarian cyst   . Migraine   . Scoliosis     Past Surgical History:  Procedure Laterality Date  . APPENDECTOMY    . BREAST SURGERY Right 2008   lump/ benign    Family History  Problem Relation Age of Onset  . Cancer Mother 63       breast  . Breast cancer Mother 51       and 12  . Alcohol abuse Father   . Vision loss Maternal Grandmother   . Heart disease Maternal Grandmother   . Heart failure Maternal Grandmother   . Cancer Maternal Grandfather   . Alcohol abuse Paternal Grandfather   . Arthritis Maternal Aunt     Social History   Socioeconomic History  . Marital status: Married    Spouse name: Not on file  . Number of children: 1  . Years of education: Not on file  . Highest education level: Not on file  Occupational History  . Occupation: CMA  Social Needs  . Financial resource  strain: Not on file  . Food insecurity:    Worry: Not on file    Inability: Not on file  . Transportation needs:    Medical: Not on file    Non-medical: Not on file  Tobacco Use  . Smoking status: Former Smoker    Types: Cigarettes    Last attempt to quit: 08/18/2016    Years since quitting: 2.2  . Smokeless tobacco: Never Used  . Tobacco comment: 3 cigarettes a day  Substance and Sexual Activity  . Alcohol use: Yes    Alcohol/week: 0.0 standard drinks    Comment: occasional  . Drug use: No  . Sexual activity: Yes    Birth control/protection: Pill  Lifestyle  . Physical activity:    Days per week: Not on file    Minutes per session: Not on file  . Stress: Not on file  Relationships  . Social connections:    Talks on phone: Not on file    Gets together: Not on file    Attends religious service: Not on file    Active member of club or organization: Not on file    Attends meetings of clubs or organizations: Not on file    Relationship status: Not on file  . Intimate partner violence:    Fear of current or ex partner: Not on file    Emotionally abused: Not on file    Physically abused: Not on file    Forced sexual activity: Not on file  Other Topics Concern  . Not on file  Social History Narrative  . Not on file   The PMH, PSH, Social History, Family History, Medications, and allergies have been reviewed in [facility], and have been updated if relevant.    Review of Systems  Constitutional: Negative.   HENT: Negative.   Eyes: Negative.   Respiratory: Negative.   Cardiovascular: Negative.   Gastrointestinal: Negative.   Endocrine: Negative.   Genitourinary: Negative.   Musculoskeletal: Negative.   Allergic/Immunologic: Negative.   Neurological: Negative.   Hematological: Negative.   Psychiatric/Behavioral: Negative.   All other systems reviewed and are negative.      Objective:    BP 112/72 (BP Location: Left Arm, Patient Position: Sitting, Cuff Size: Normal)    Pulse 79   Temp 97.8 F (36.6 C) (Oral)   Ht 5\' 10"  (1.778 m)  Wt 230 lb 12.8 oz (104.7 kg)   LMP 11/11/2018   SpO2 97%   BMI 33.12 kg/m    Physical Exam   General:  Well-developed,well-nourished,in no acute distress; alert,appropriate and cooperative throughout examination Head:  normocephalic and atraumatic.   Eyes:  vision grossly intact, PERRL Ears:  R ear normal and L ear normal externally, TMs clear bilaterally Nose:  no external deformity.   Mouth:  good dentition.   Neck:  No deformities, masses, or tenderness noted. Breasts:  No mass, nodules, thickening, tenderness, bulging, retraction, inflamation, nipple discharge or skin changes noted.   Lungs:  Normal respiratory effort, chest expands symmetrically. Lungs are clear to auscultation, no crackles or wheezes. Heart:  Normal rate and regular rhythm. S1 and S2 normal without gallop, murmur, click, rub or other extra sounds. Abdomen:  Bowel sounds positive,abdomen soft and non-tender without masses, organomegaly or hernias noted. Rectal:  no external abnormalities.   Genitalia:  Pelvic Exam:        External: normal female genitalia without lesions or masses        Vagina: normal without lesions or masses        Cervix: normal without lesions or masses        Adnexa: normal bimanual exam without masses or fullness        Uterus: normal by palpation        Pap smear: performed Msk:  No deformity or scoliosis noted of thoracic or lumbar spine.   Extremities:  No clubbing, cyanosis, edema, or deformity noted with normal full range of motion of all joints.   Neurologic:  alert & oriented X3 and gait normal.   Skin:  Intact without suspicious lesions or rashes Cervical Nodes:  No lymphadenopathy noted Axillary Nodes:  No palpable lymphadenopathy Psych:  Cognition and judgment appear intact. Alert and cooperative with normal attention span and concentration. No apparent delusions, illusions, hallucinations        Assessment & Plan:   Well woman exam with routine gynecological exam - Plan: Cytology - PAP( )  Family history of breast cancer in mother  Need for Tdap vaccination - Plan: Tdap vaccine greater than or equal to 7yo IM No follow-ups on file.

## 2018-11-12 ENCOUNTER — Other Ambulatory Visit (HOSPITAL_COMMUNITY)
Admission: RE | Admit: 2018-11-12 | Discharge: 2018-11-12 | Disposition: A | Discharge status: Home / Self Care | Payer: 59 | Source: Ambulatory Visit | Attending: Family Medicine | Admitting: Family Medicine

## 2018-11-12 ENCOUNTER — Ambulatory Visit (INDEPENDENT_AMBULATORY_CARE_PROVIDER_SITE_OTHER): Payer: 59 | Admitting: Family Medicine

## 2018-11-12 VITALS — BP 112/72 | HR 79 | Temp 97.8°F | Ht 70.0 in | Wt 230.8 lb

## 2018-11-12 DIAGNOSIS — Z01419 Encounter for gynecological examination (general) (routine) without abnormal findings: Secondary | ICD-10-CM

## 2018-11-12 DIAGNOSIS — Z23 Encounter for immunization: Secondary | ICD-10-CM

## 2018-11-12 DIAGNOSIS — F419 Anxiety disorder, unspecified: Secondary | ICD-10-CM | POA: Diagnosis not present

## 2018-11-12 DIAGNOSIS — F329 Major depressive disorder, single episode, unspecified: Secondary | ICD-10-CM

## 2018-11-12 DIAGNOSIS — Z803 Family history of malignant neoplasm of breast: Secondary | ICD-10-CM | POA: Diagnosis not present

## 2018-11-12 MED ORDER — ESCITALOPRAM OXALATE 20 MG PO TABS: 20.0000 mg | ORAL_TABLET | Freq: Every day | ORAL | 1 refills | Status: DC

## 2018-11-12 MED ORDER — NORETHINDRONE-ETH ESTRADIOL 1-35 MG-MCG PO TABS: 1.0000 | ORAL_TABLET | Freq: Every day | ORAL | 3 refills | Status: DC

## 2018-11-12 NOTE — Assessment & Plan Note (Signed)
Reviewed preventive care protocols, scheduled due services, and updated immunizations Discussed nutrition, exercise, diet, and healthy lifestyle.  Pap smear done today. 

## 2018-11-12 NOTE — Assessment & Plan Note (Signed)
Doing well on current dose of lexapro- eRx refills sent today.

## 2018-11-18 LAB — CYTOLOGY - PAP
Diagnosis: NEGATIVE
HPV: NOT DETECTED

## 2018-11-19 ENCOUNTER — Encounter: Payer: Self-pay | Admitting: Family Medicine

## 2018-11-20 ENCOUNTER — Ambulatory Visit (INDEPENDENT_AMBULATORY_CARE_PROVIDER_SITE_OTHER): Payer: 59 | Admitting: Family Medicine

## 2018-11-20 ENCOUNTER — Encounter: Payer: Self-pay | Admitting: Family Medicine

## 2018-11-20 ENCOUNTER — Other Ambulatory Visit: Payer: Self-pay

## 2018-11-20 VITALS — BP 118/80 | HR 74 | Temp 98.2°F | Ht 70.0 in | Wt 232.1 lb

## 2018-11-20 DIAGNOSIS — B001 Herpesviral vesicular dermatitis: Secondary | ICD-10-CM | POA: Diagnosis not present

## 2018-11-20 DIAGNOSIS — R21 Rash and other nonspecific skin eruption: Secondary | ICD-10-CM | POA: Insufficient documentation

## 2018-11-20 MED ORDER — VALACYCLOVIR HCL 1 G PO TABS: 2000.0000 mg | ORAL_TABLET | Freq: Two times a day (BID) | ORAL | 3 refills | Status: DC

## 2018-11-20 NOTE — Assessment & Plan Note (Signed)
Progressive, face mask use exacerbating rash. Rx valtrex 2gm BID 1d course. Continue abreva. Update if not improving with treatment.

## 2018-11-20 NOTE — Patient Instructions (Signed)
Valtrex 2 grams twice daily for fever blisters I do want you to use mupirocin to rash on nose - may be bacterial infection.  Face mask is contributing to worsening symptoms. Hopefully oral antiviral will speed healing process.

## 2018-11-20 NOTE — Assessment & Plan Note (Signed)
Erythematous nasal rash atypical location for herpes labialis - possible nasal cellulitis. Advised start using mupirocin she has at home TID. Update if spreading or not improving with treatment.

## 2018-11-20 NOTE — Progress Notes (Signed)
This visit was conducted in person.  BP 118/80 (BP Location: Left Arm, Patient Position: Sitting, Cuff Size: Normal)   Pulse 74   Temp 98.2 F (36.8 C) (Oral)   Ht 5\' 10"  (1.778 m)   Wt 232 lb 2 oz (105.3 kg)   LMP 11/11/2018   SpO2 97%   BMI 33.31 kg/m    CC: facial rash Subjective:    Patient ID: Autumn [name], female    DOB: Jan 31, 1986, 33 y.o.   MRN: [id number]  HPI: Autumn [name] is a 33 y.o. female presenting on 11/20/2018 for Rash (C/o rash on the face. Also, has some whiteheads on nose that blister and draining. Sxs started 11/17/18.  Tried Abreva. )   3d h/o facial rash that started as blisters on tip of nose, with spread of "fever blisters" under nose. Started using topical Abreva (twice a day). Over last 2 days noticing rash spreading around mouth to lower lip now. She has been squeezing nasal lesions draining white to clear liquid.   Some malaise with current symptoms as well as loss of appetite.  H/o fever blisters.  Today started using Abreva more regularly.   No fevers/chills, nausea.   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  Has been using cloth masks and our regular masks.   Recent UTI treated with keflex, seen by [facility]-visit. Upcoming trip to the beach.      Relevant past medical, surgical, family and social history reviewed and updated as indicated. Interim medical history since our last visit reviewed. Allergies and medications reviewed and updated. Outpatient Medications Prior to Visit  Medication Sig Dispense Refill  . aspirin-acetaminophen-caffeine (EXCEDRIN MIGRAINE) 250-250-65 MG tablet Take by mouth every 6 (six) hours as needed for headache.    . escitalopram (LEXAPRO) 20 MG tablet Take 1 tablet (20 mg total) by mouth daily. 90 tablet 1  . ibuprofen (ADVIL) 200 MG tablet Take 200 mg by mouth every 6 (six) hours as needed.    . norethindrone-ethinyl estradiol 1/35 (ALAYCEN 1/35) tablet Take 1 tablet by mouth daily. 84  tablet 3  . cephALEXin (KEFLEX) 500 MG capsule Take 1 capsule (500 mg total) by mouth 2 (two) times daily. 14 capsule 0   No facility-administered medications prior to visit.      Per HPI unless specifically indicated in ROS section below Review of Systems Objective:    BP 118/80 (BP Location: Left Arm, Patient Position: Sitting, Cuff Size: Normal)   Pulse 74   Temp 98.2 F (36.8 C) (Oral)   Ht 5\' 10"  (1.778 m)   Wt 232 lb 2 oz (105.3 kg)   LMP 11/11/2018   SpO2 97%   BMI 33.31 kg/m   Wt Readings from Last 3 Encounters:  11/20/18 232 lb 2 oz (105.3 kg)  11/12/18 230 lb 12.8 oz (104.7 kg)  10/29/18 233 lb 9.6 oz (106 kg)    Physical Exam Vitals signs and nursing note reviewed.  Constitutional:      General: She is not in acute distress.    Appearance: Normal appearance. She is not ill-appearing.  HENT:     Nose: No congestion or rhinorrhea.     Mouth/Throat:     Mouth: Mucous membranes are moist.     Pharynx: Oropharynx is clear. No oropharyngeal exudate or posterior oropharyngeal erythema.  Eyes:     Extraocular Movements: Extraocular movements intact.     Pupils: Pupils are equal, round, and reactive to light.  Lymphadenopathy:     Cervical: No cervical adenopathy.  Skin:    Findings: Erythema and rash present.     Comments: Erythematous patch on tip of nose without blistering or pustule apparent - she just cleaned area. Coalescent vesicular rash upper and lower lips       Assessment & Plan:   Problem List Items Addressed This Visit    Fever blister - Primary    Progressive, face mask use exacerbating rash. Rx valtrex 2gm BID 1d course. Continue abreva. Update if not improving with treatment.       Relevant Medications   valACYclovir (VALTREX) 1000 MG tablet   Facial rash    Erythematous nasal rash atypical location for herpes labialis - possible nasal cellulitis. Advised start using mupirocin she has at home TID. Update if spreading or not improving with  treatment.           Meds ordered this encounter  Medications  . valACYclovir (VALTREX) 1000 MG tablet    Sig: Take 2 tablets (2,000 mg total) by mouth 2 (two) times daily.    Dispense:  4 tablet    Refill:  3   No orders of the defined types were placed in this encounter.   Follow up plan: No follow-ups on file.  Ria Bush, MD

## 2018-11-21 ENCOUNTER — Encounter: Payer: Self-pay | Admitting: Family Medicine

## 2018-11-21 ENCOUNTER — Other Ambulatory Visit: Payer: Self-pay

## 2018-11-21 MED ORDER — VALACYCLOVIR HCL 1 G PO TABS: 2000.0000 mg | ORAL_TABLET | Freq: Three times a day (TID) | ORAL | 3 refills | Status: DC

## 2018-11-27 ENCOUNTER — Encounter: Payer: Self-pay | Admitting: Family Medicine

## 2018-11-27 ENCOUNTER — Ambulatory Visit: Payer: 59 | Admitting: Family Medicine

## 2018-11-27 ENCOUNTER — Other Ambulatory Visit: Payer: Self-pay

## 2018-11-27 VITALS — BP 128/78 | HR 77 | Temp 97.9°F | Ht 70.0 in | Wt 234.0 lb

## 2018-11-27 DIAGNOSIS — N760 Acute vaginitis: Secondary | ICD-10-CM | POA: Diagnosis not present

## 2018-11-27 DIAGNOSIS — R350 Frequency of micturition: Secondary | ICD-10-CM

## 2018-11-27 DIAGNOSIS — R300 Dysuria: Secondary | ICD-10-CM | POA: Diagnosis not present

## 2018-11-27 LAB — POC URINALSYSI DIPSTICK (AUTOMATED)
Bilirubin, UA: NEGATIVE
Blood, UA: NEGATIVE
Glucose, UA: NEGATIVE
Ketones, UA: NEGATIVE
Leukocytes, UA: NEGATIVE
Nitrite, UA: NEGATIVE
Protein, UA: NEGATIVE
Spec Grav, UA: 1.025 (ref 1.010–1.025)
Urobilinogen, UA: 0.2 E.U./dL
pH, UA: 6 (ref 5.0–8.0)

## 2018-11-27 MED ORDER — METRONIDAZOLE 500 MG PO TABS: 500.0000 mg | ORAL_TABLET | Freq: Two times a day (BID) | ORAL | 0 refills | Status: DC

## 2018-11-27 NOTE — Progress Notes (Signed)
Subjective:    Patient ID: Autumn [name], female    DOB: 1985/12/12, 33 y.o.   MRN: [id number]  HPI  Here for urinary symptoms   Took keflex for uti in may  Got better and then worse   Now having some pelvic discomfort as well as a pulsating pain on L side /pelvic Pressure /cramping Urgent /freuent urination  Some nausea  No fever No vomiting   No vaginal d/c   Just had PE with Dr Oralia Rud is normal     Wt Readings from Last 3 Encounters:  11/27/18 234 lb (106.1 kg)  11/20/18 232 lb 2 oz (105.3 kg)  11/12/18 230 lb 12.8 oz (104.7 kg)   33.58 kg/m   UA: Results for orders placed or performed in visit on 11/27/18  POCT Urinalysis Dipstick (Automated)  Result Value Ref Range   Color, UA Yellow    Clarity, UA Hazy    Glucose, UA Negative Negative   Bilirubin, UA Negative    Ketones, UA Negative    Spec Grav, UA 1.025 1.010 - 1.025   Blood, UA Negative    pH, UA 6.0 5.0 - 8.0   Protein, UA Negative Negative   Urobilinogen, UA 0.2 0.2 or 1.0 E.U./dL   Nitrite, UA Negative    Leukocytes, UA Negative Negative     Micro- can see evidence of trich  Wet prep- some clue cells   Monogamous- is married  Is active with her husband-more lately   Had klebsiella uti in march  Was treated   Patient Active Problem List   Diagnosis Date Noted  . Urine frequency 11/27/2018  . Vaginitis 11/27/2018  . Fever blister 11/20/2018  . Facial rash 11/20/2018  . Myalgia 08/21/2018  . Shingles 06/20/2018  . Neck pain 04/16/2018  . Nonallopathic lesion of cervical region 04/16/2018  . Nonallopathic lesion of rib cage 04/16/2018  . Nonallopathic lesion of thoracic region 04/16/2018  . Epigastric discomfort 09/02/2017  . Contraception management 08/30/2016  . Family history of breast cancer in mother 08/30/2016  . Well woman exam with routine gynecological exam 08/19/2014  . Obesity (BMI 30.0-34.9) 08/19/2014  . Amenorrhea 12/25/2013  . Dysuria 03/07/2013  . Anxiety  and depression 07/15/2012   Past Medical History:  Diagnosis Date  . Anxiety   . Back pain    chronic  . Depression   . Disc disease, degenerative, lumbar or lumbosacral   . History of ovarian cyst   . Migraine   . Scoliosis    Past Surgical History:  Procedure Laterality Date  . APPENDECTOMY    . BREAST SURGERY Right 2008   lump/ benign   Social History   Tobacco Use  . Smoking status: Former Smoker    Types: Cigarettes    Quit date: 08/18/2016    Years since quitting: 2.2  . Smokeless tobacco: Never Used  . Tobacco comment: 3 cigarettes a day  Substance Use Topics  . Alcohol use: Yes    Alcohol/week: 0.0 standard drinks    Comment: occasional  . Drug use: No   Family History  Problem Relation Age of Onset  . Cancer Mother 58       breast  . Breast cancer Mother 34       and 7  . Alcohol abuse Father   . Vision loss Maternal Grandmother   . Heart disease Maternal Grandmother   . Heart failure Maternal Grandmother   . Cancer Maternal Grandfather   . Alcohol abuse  Paternal Grandfather   . Arthritis Maternal Aunt    Allergies  Allergen Reactions  . Imitrex [Sumatriptan] Other (See Comments)    Vomiting, Slurred Speech and Facial Numbness   Current Outpatient Medications on File Prior to Visit  Medication Sig Dispense Refill  . aspirin-acetaminophen-caffeine (EXCEDRIN MIGRAINE) 250-250-65 MG tablet Take by mouth every 6 (six) hours as needed for headache.    . escitalopram (LEXAPRO) 20 MG tablet Take 1 tablet (20 mg total) by mouth daily. 90 tablet 1  . ibuprofen (ADVIL) 200 MG tablet Take 200 mg by mouth every 6 (six) hours as needed.    . norethindrone-ethinyl estradiol 1/35 (ALAYCEN 1/35) tablet Take 1 tablet by mouth daily. 84 tablet 3   No current facility-administered medications on file prior to visit.     Review of Systems  Constitutional: Negative for activity change, appetite change, fatigue, fever and unexpected weight change.  HENT: Negative  for congestion, ear pain, rhinorrhea, sinus pressure and sore throat.   Eyes: Negative for pain, redness and visual disturbance.  Respiratory: Negative for cough, shortness of breath and wheezing.   Cardiovascular: Negative for chest pain and palpitations.  Gastrointestinal: Negative for abdominal pain, blood in stool, constipation and diarrhea.  Endocrine: Negative for polydipsia and polyuria.  Genitourinary: Positive for dysuria. Negative for flank pain, frequency, genital sores, urgency, vaginal bleeding and vaginal discharge.       Mild pelvic pain   Musculoskeletal: Negative for arthralgias, back pain and myalgias.  Skin: Negative for pallor and rash.  Allergic/Immunologic: Negative for environmental allergies.  Neurological: Negative for dizziness, syncope and headaches.  Hematological: Negative for adenopathy. Does not bruise/bleed easily.  Psychiatric/Behavioral: Negative for decreased concentration and dysphoric mood. The patient is not nervous/anxious.        Objective:   Physical Exam Constitutional:      General: She is not in acute distress.    Appearance: Normal appearance. She is obese. She is not ill-appearing.  HENT:     Mouth/Throat:     Mouth: Mucous membranes are moist.     Pharynx: Oropharynx is clear. No posterior oropharyngeal erythema.  Eyes:     General: No scleral icterus.    Conjunctiva/sclera: Conjunctivae normal.     Pupils: Pupils are equal, round, and reactive to light.  Neck:     Musculoskeletal: Neck supple.  Cardiovascular:     Rate and Rhythm: Normal rate and regular rhythm.  Pulmonary:     Effort: Pulmonary effort is normal. No respiratory distress.     Breath sounds: No wheezing or rales.  Abdominal:     General: Abdomen is flat. There is no distension.     Palpations: There is no mass.     Tenderness: There is abdominal tenderness.     Hernia: No hernia is present.     Comments: Mild suprapubic tenderness No rebound/guarding or M  No  cva tenderness  Genitourinary:    Comments: Nl appearing vaginal mucosa  No lesions  Nl appearing urethra Lymphadenopathy:     Cervical: No cervical adenopathy.  Skin:    General: Skin is warm and dry.     Findings: No rash.  Neurological:     Mental Status: She is alert.  Psychiatric:        Mood and Affect: Mood normal.           Assessment & Plan:   Problem List Items Addressed This Visit      Genitourinary   Vaginitis - Primary  Suspect this may add to her urinary symptoms  Trick seen in urine  Wet prep-few clue cells  tx with flagyl (inst to avoid etoh while taking this)  Update if not starting to improve in a week or if worsening        Relevant Orders   POCT Wet Prep [facility]) (Completed)     Other   Dysuria    This may be from vaginitis- did still send urine for cx       Relevant Orders   Urine Culture (Completed)   Urine frequency   Relevant Orders   Urine Culture (Completed)    Other Visit Diagnoses    Urinary frequency       Relevant Orders   POCT Urinalysis Dipstick (Automated) (Completed)

## 2018-11-27 NOTE — Patient Instructions (Signed)
Drink lots of fluids  Take the flagyl as directed with food  Do not drink alcohol on this   I sent a urine culture- we will inform you when it returns  If symptoms worsen let me know

## 2018-11-28 LAB — URINE CULTURE
MICRO NUMBER:: 584018
SPECIMEN QUALITY:: ADEQUATE

## 2018-11-30 LAB — POCT WET PREP (WET MOUNT)

## 2018-11-30 NOTE — Assessment & Plan Note (Signed)
Suspect this may add to her urinary symptoms  Trick seen in urine  Wet prep-few clue cells  tx with flagyl (inst to avoid etoh while taking this)  Update if not starting to improve in a week or if worsening

## 2018-11-30 NOTE — Assessment & Plan Note (Signed)
This may be from vaginitis- did still send urine for cx

## 2018-12-04 ENCOUNTER — Ambulatory Visit: Payer: 59 | Admitting: Dietician

## 2018-12-05 ENCOUNTER — Telehealth: Payer: Self-pay | Admitting: Dietician

## 2018-12-05 NOTE — Telephone Encounter (Signed)
Called patient to reschedule her appointment from 12/04/18, which she cancelled due to change in her work schedule. Left a message offering several available dates and times and requested a call back.

## 2018-12-07 ENCOUNTER — Encounter: Payer: Self-pay | Admitting: Family Medicine

## 2018-12-15 ENCOUNTER — Encounter: Payer: Self-pay | Admitting: Family Medicine

## 2018-12-21 LAB — MYVANTAGE HEREDITARY CANCER PANEL

## 2018-12-21 LAB — PATH VARIANT

## 2018-12-24 ENCOUNTER — Encounter: Payer: Self-pay | Admitting: Family Medicine

## 2019-01-01 ENCOUNTER — Encounter: Payer: Self-pay | Admitting: Dietician

## 2019-01-01 ENCOUNTER — Other Ambulatory Visit: Payer: Self-pay

## 2019-01-01 ENCOUNTER — Encounter: Payer: 59 | Attending: Family Medicine | Admitting: Dietician

## 2019-01-01 VITALS — Ht 69.75 in | Wt 230.7 lb

## 2019-01-01 DIAGNOSIS — Z713 Dietary counseling and surveillance: Secondary | ICD-10-CM

## 2019-01-01 NOTE — Progress Notes (Signed)
 Employee "self referral" nutrition session: Start time: 1700   End time: 1730  Height: 5'9.75"  Weight: 230.7lbs  Met with employee to discuss his/her nutritional concerns and diet history.   Diet history:  Reports eating 1-2 pieces of fruit daily ie cantaloupe, cherries; has decreased intake of sweets. Bringing lunches from home more often  Typical eating pattern: Breakfast: coffee + eggs with spinach and feta cheese; cheese and salami sandwich; Nutri-grain bar; occasionally fruit only ie peach Snack: small handful chex mix; fruit and cottage cheese Lunch: sandwich; chicken with ranch on sandwich, strawberries/ cherries Snack: none or fruit Supper: beans, pico de gallo, sour cream, guacamole, chips for past week; fewer vegetables recently; one meal of roast beef, corn, potatoes; grilled cheese and fruit; not many planned meals recently Snack: occasional snack 7/22 sandwich due to feeling hungry  Beverages:    Education topics covered during this visit:  General nutrition/ Healthy eating  Exercise  Weight Concerns   Educational resources provided:  Goals and instructions on AVS   Additional Comments: Patient is making gradual progress with diet and lifestyle changes; she feel she needs to move more/ increase regular exercise in order to continue with gradual weight loss.    Plan:  Implement and follow goals as listed in Patient Instructions.  Call or email with any questions or concerns.

## 2019-01-01 NOTE — Patient Instructions (Signed)
   Begin some regular exercise; make it enjoyable as much as possible, try tracking activity on calendar or LiveLifeWell.   Consider keeping a food diary or tracking on LiveLifeWell or phone app such as MyFitnessPal or LoseIt to stay aware of eating habits and keep control of food choices and extra snacks.   Work to [employer] intake -- think of bread as a side dish rather than a main part of a meal.

## 2019-02-20 ENCOUNTER — Ambulatory Visit: Payer: 59 | Admitting: Family Medicine

## 2019-02-21 ENCOUNTER — Ambulatory Visit
Admission: EM | Admit: 2019-02-21 | Discharge: 2019-02-21 | Disposition: A | Discharge status: Home / Self Care | Payer: 59 | Attending: Emergency Medicine | Admitting: Emergency Medicine

## 2019-02-21 ENCOUNTER — Encounter: Payer: Self-pay | Admitting: Emergency Medicine

## 2019-02-21 ENCOUNTER — Other Ambulatory Visit: Payer: Self-pay

## 2019-02-21 DIAGNOSIS — J029 Acute pharyngitis, unspecified: Secondary | ICD-10-CM

## 2019-02-21 DIAGNOSIS — R05 Cough: Secondary | ICD-10-CM | POA: Diagnosis not present

## 2019-02-21 DIAGNOSIS — Z7189 Other specified counseling: Secondary | ICD-10-CM

## 2019-02-21 DIAGNOSIS — R0981 Nasal congestion: Secondary | ICD-10-CM | POA: Diagnosis not present

## 2019-02-21 DIAGNOSIS — M791 Myalgia, unspecified site: Secondary | ICD-10-CM | POA: Diagnosis not present

## 2019-02-21 DIAGNOSIS — J069 Acute upper respiratory infection, unspecified: Secondary | ICD-10-CM

## 2019-02-21 LAB — URINALYSIS, COMPLETE (UACMP) WITH MICROSCOPIC
Bacteria, UA: NONE SEEN
Bilirubin Urine: NEGATIVE
Glucose, UA: NEGATIVE mg/dL
Hgb urine dipstick: NEGATIVE
Ketones, ur: NEGATIVE mg/dL
Leukocytes,Ua: NEGATIVE
Nitrite: NEGATIVE
Protein, ur: NEGATIVE mg/dL
RBC / HPF: NONE SEEN RBC/hpf (ref 0–5)
Specific Gravity, Urine: 1.02 (ref 1.005–1.030)
WBC, UA: NONE SEEN WBC/hpf (ref 0–5)
pH: 5.5 (ref 5.0–8.0)

## 2019-02-21 LAB — RAPID STREP SCREEN (MED CTR MEBANE ONLY): Streptococcus, Group A Screen (Direct): NEGATIVE

## 2019-02-21 MED ORDER — PREDNISONE 10 MG (21) PO TBPK: ORAL_TABLET | ORAL | 1 refills | Status: DC

## 2019-02-21 NOTE — Discharge Instructions (Signed)
You were seen today for viral symptoms and were tested for the COVID-19. You are to stay quarantined until your test result return.   Take the medications prescribed to you until they are complete.   Treat your symptoms with over the counter medications, rest and stay hydrated.  If you get worse, go to the ER.

## 2019-02-21 NOTE — ED Triage Notes (Signed)
Patient c/o sore throat, cough, congestion and bodyaches that started yesterday.

## 2019-02-22 NOTE — ED Provider Notes (Signed)
[facility] - [place], [place]   Name: Autumn [name] DOB: 05-14-1986 MRN: [id number] CSN: [id number] PCP: Lucille Passy, MD  Arrival date and time:  02/21/19 1438  Chief Complaint:  Sore Throat and Cough   NOTE: Prior to seeing the patient today, I have reviewed the triage nursing documentation and vital signs. Clinical staff has updated patient's PMH/PSHx, current medication list, and drug allergies/intolerances to ensure comprehensive history available to assist in medical decision making.   History:   HPI: Autumn [name] is a 33 y.o. female who presents today with complaints of sore throat, cough, congestion and body aches. She noticed these symptoms yesterday and started treatment with OTC cold/cough medication. She noticed a slight improvement to her cough, but the rest of her symptoms continued. 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 She present today with her husband who is presenting similar symptoms.    Past Medical History:  Diagnosis Date  . Anxiety   . Back pain    chronic  . Depression   . Disc disease, degenerative, lumbar or lumbosacral   . History of ovarian cyst   . Migraine   . Scoliosis     Past Surgical History:  Procedure Laterality Date  . APPENDECTOMY    . BREAST SURGERY Right 2008   lump/ benign    Family History  Problem Relation Age of Onset  . Cancer Mother 53       breast  . Breast cancer Mother 37       and 19  . Alcohol abuse Father   . Vision loss Maternal Grandmother   . Heart disease Maternal Grandmother   . Heart failure Maternal Grandmother   . Cancer Maternal Grandfather   . Alcohol abuse Paternal Grandfather   . Arthritis Maternal Aunt     Social History   Tobacco Use  . Smoking status: Former Smoker    Types: Cigarettes    Quit date: 08/18/2016    Years since quitting: 2.5  .  Smokeless tobacco: Never Used  . Tobacco comment: 3 cigarettes a day  Substance Use Topics  . Alcohol use: Yes    Alcohol/week: 0.0 standard drinks    Comment: occasional  . Drug use: No    Patient Active Problem List   Diagnosis Date Noted  . Urine frequency 11/27/2018  . Vaginitis 11/27/2018  . Fever blister 11/20/2018  . Facial rash 11/20/2018  . Myalgia 08/21/2018  . Shingles 06/20/2018  . Neck pain 04/16/2018  . Nonallopathic lesion of cervical region 04/16/2018  . Nonallopathic lesion of rib cage 04/16/2018  . Nonallopathic lesion of thoracic region 04/16/2018  . Epigastric discomfort 09/02/2017  . Contraception management 08/30/2016  . Family history of breast cancer in mother 08/30/2016  . Well woman exam with routine gynecological exam 08/19/2014  . Obesity (BMI 30.0-34.9) 08/19/2014  . Amenorrhea 12/25/2013  . Dysuria 03/07/2013  . Anxiety and depression 07/15/2012    Home Medications:    Current Meds  Medication Sig  . escitalopram (LEXAPRO) 20 MG tablet Take 1 tablet (20 mg total) by mouth daily.  . norethindrone-ethinyl estradiol 1/35 (ALAYCEN 1/35) tablet Take 1 tablet by mouth daily.    Allergies:   Imitrex [sumatriptan]  Review of Systems (ROS): Review of Systems  Constitutional: Positive for activity change, chills and fatigue. Negative for appetite change and diaphoresis.  HENT: Positive  for postnasal drip and sore throat. Negative for congestion, sinus pain and sneezing.   Respiratory: Positive for cough and shortness of breath. Negative for wheezing.   Cardiovascular: Negative for chest pain.  Gastrointestinal: Negative for diarrhea, nausea and vomiting.  Musculoskeletal: Positive for myalgias.  All other systems reviewed and are negative.    Vital Signs: Today's Vitals   02/21/19 1459 02/21/19 1502 02/21/19 1600  BP:  112/86   Pulse:  84   Resp:  16   Temp:  99.3 F (37.4 C)   TempSrc:  Oral   SpO2:  99%   Weight: 232 lb (105.2 kg)     Height: 5\' 9"  (1.753 m)    PainSc: 8   8     Physical Exam: Physical Exam Vitals signs and nursing note reviewed.  HENT:     Head: Normocephalic.     Right Ear: No middle ear effusion.     Left Ear: Tympanic membrane normal.  No middle ear effusion.     Nose:     Right Sinus: No maxillary sinus tenderness or frontal sinus tenderness.     Left Sinus: No maxillary sinus tenderness or frontal sinus tenderness.     Mouth/Throat:     Mouth: Mucous membranes are moist.     Pharynx: Posterior oropharyngeal erythema present.     Tonsils: No tonsillar exudate or tonsillar abscesses.  Neck:     Musculoskeletal: Normal range of motion.  Cardiovascular:     Rate and Rhythm: Normal rate and regular rhythm.     Heart sounds: Normal heart sounds.  Pulmonary:     Effort: Pulmonary effort is normal.     Breath sounds: Decreased air movement present.  Lymphadenopathy:     Cervical: No cervical adenopathy.  Skin:    General: Skin is warm and dry.  Neurological:     Mental Status: She is alert.     Urgent Care Treatments / Results:   LABS: PLEASE NOTE: all labs that were ordered this encounter are listed, however only abnormal results are displayed. Labs Reviewed  RAPID STREP SCREEN ([facility] ONLY)  NOVEL CORONAVIRUS, NAA (HOSP ORDER, SEND-OUT TO REF LAB; TAT 18-24 HRS)  CULTURE, GROUP A STREP ([place])  URINALYSIS, COMPLETE ([facility]) WITH MICROSCOPIC    EKG: -None  RADIOLOGY: No results found.  PROCEDURES: Procedures  MEDICATIONS RECEIVED THIS VISIT: Medications - No data to display  PERTINENT CLINICAL COURSE NOTES/UPDATES:   Initial Impression / Assessment and Plan / Urgent Care Course:  Pertinent labs & imaging results that were available during my care of the patient were personally reviewed by me and considered in my medical decision making (see lab/imaging section of note for values and interpretations).  Autumn [name] is a 33 y.o. female who presents to  [facility] today with complaints of viral symptoms, diagnosed with a viral URI with a cough, and treated as such with the medications below, the tests above and the home care instructions below. NP and patient reviewed discharge instructions below during visit.   Patient is well appearing overall in clinic today. She does not appear to be in any acute distress. Presenting symptoms (see HPI) and exam as documented above.   I have reviewed the follow up and strict return precautions for any new or worsening symptoms. Patient is aware of symptoms that would be deemed urgent/emergent, and would thus require further evaluation either here or in the emergency department. At the time of discharge, she verbalized understanding  and consent with the discharge plan as it was reviewed with her. All questions were fielded by provider and/or clinic staff prior to patient discharge.    Final Clinical Impressions / Urgent Care Diagnoses:   Final diagnoses:  Advice Given About 2019 Novel Coronavirus Infection  Viral URI    New Prescriptions:  [place] Controlled Substance Registry consulted? Not Applicable  Meds ordered this encounter  Medications  . predniSONE (STERAPRED UNI-PAK 21 TAB) 10 MG (21) TBPK tablet    Sig: Take as instructed on package (60, 50, 40, 30, 20, 10)    Dispense:  1 each    Refill:  1      Discharge Instructions     You were seen today for viral symptoms and were tested for the COVID-19. You are to stay quarantined until your test result return.   Take the medications prescribed to you until they are complete.   Treat your symptoms with over the counter medications, rest and stay hydrated.  If you get worse, go to the ER.      Recommended Follow up Care:  Patient encouraged to follow up with the following provider within the specified time frame, or sooner as dictated by the severity of her symptoms. As always, she was instructed that for any urgent/emergent care needs,  she should seek care either here or in the emergency department for more immediate evaluation.   Gertie Baron, DNP, NP-c    Gertie Baron, NP 02/22/19 1122

## 2019-02-23 ENCOUNTER — Encounter (HOSPITAL_COMMUNITY): Payer: Self-pay

## 2019-02-23 LAB — NOVEL CORONAVIRUS, NAA (HOSP ORDER, SEND-OUT TO REF LAB; TAT 18-24 HRS): SARS-CoV-2, NAA: NOT DETECTED

## 2019-02-24 LAB — CULTURE, GROUP A STREP (THRC)

## 2019-03-03 ENCOUNTER — Other Ambulatory Visit: Payer: Self-pay

## 2019-03-03 ENCOUNTER — Encounter: Payer: Self-pay | Admitting: Family Medicine

## 2019-03-03 ENCOUNTER — Ambulatory Visit (INDEPENDENT_AMBULATORY_CARE_PROVIDER_SITE_OTHER)
Admission: RE | Admit: 2019-03-03 | Discharge: 2019-03-03 | Disposition: A | Discharge status: Home / Self Care | Payer: 59 | Source: Ambulatory Visit | Attending: Family Medicine | Admitting: Family Medicine

## 2019-03-03 ENCOUNTER — Ambulatory Visit: Payer: 59 | Admitting: Family Medicine

## 2019-03-03 VITALS — BP 104/82 | HR 82 | Temp 97.3°F | Ht 70.0 in | Wt 235.5 lb

## 2019-03-03 DIAGNOSIS — R05 Cough: Secondary | ICD-10-CM

## 2019-03-03 DIAGNOSIS — R059 Cough, unspecified: Secondary | ICD-10-CM

## 2019-03-03 MED ORDER — ALBUTEROL SULFATE HFA 108 (90 BASE) MCG/ACT IN AERS: 2.0000 | INHALATION_SPRAY | Freq: Four times a day (QID) | RESPIRATORY_TRACT | 0 refills | Status: DC | PRN

## 2019-03-03 MED ORDER — DOXYCYCLINE HYCLATE 100 MG PO TABS: 100.0000 mg | ORAL_TABLET | Freq: Two times a day (BID) | ORAL | 0 refills | Status: DC

## 2019-03-03 NOTE — Progress Notes (Signed)
Chief Complaint  Patient presents with  . 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  She started with new onset body aches, scratchy throat cough moist on 02/20/2019.. husband with similar symptoms. Right ear redness.  No wheeze, mild SOB.    She reports  At urgent care felt viral infeciton. Neg Covid test 02/21/2019 Given prednisone taper.. stopped after 2 days given SE.  She had fever 100.4 last 7 days ago.  Continued cough, shortness of breath. Now more productive cough. Green mucus. No chest pain.  No longer with ST or body aches.  Post-tussive emesis  Tired.  She is using Dayquil and robitussin at night.   Former smoker History of pneumonitis 3 years  COVID 19 screen No recent travel or known exposure to [place] The patient denies respiratory symptoms of COVID 19 at this time.  The importance of social distancing was discussed today.   Review of Systems  Constitutional: Negative for chills and fever.  HENT: Negative for congestion and ear pain.   Eyes: Negative for pain and redness.  Respiratory: Positive for cough, shortness of breath and wheezing.   Cardiovascular: Negative for chest pain, palpitations and leg swelling.  Gastrointestinal: Negative for abdominal pain, blood in stool, constipation, diarrhea, nausea and vomiting.  Genitourinary: Negative for dysuria.  Musculoskeletal: Negative for falls and myalgias.  Skin: Negative for rash.  Neurological: Negative for dizziness.  Psychiatric/Behavioral: Negative for depression. The patient is not nervous/anxious.       Past Medical History:  Diagnosis Date  . Anxiety   . Back pain    chronic  . Depression   . Disc disease, degenerative, lumbar or lumbosacral   . History of ovarian cyst   . Migraine   . Scoliosis     reports that she quit smoking about 2 years ago. Her smoking use included cigarettes. She has never used smokeless tobacco. She  reports current alcohol use. She reports that she does not use drugs.   Current Outpatient Medications:  .  aspirin-acetaminophen-caffeine (EXCEDRIN MIGRAINE) 250-250-65 MG tablet, Take by mouth every 6 (six) hours as needed for headache., Disp: , Rfl:  .  escitalopram (LEXAPRO) 20 MG tablet, Take 1 tablet (20 mg total) by mouth daily., Disp: 90 tablet, Rfl: 1 .  ibuprofen (ADVIL) 200 MG tablet, Take 200 mg by mouth every 6 (six) hours as needed., Disp: , Rfl:  .  norethindrone-ethinyl estradiol 1/35 (ALAYCEN 1/35) tablet, Take 1 tablet by mouth daily., Disp: 84 tablet, Rfl: 3 .  valACYclovir (VALTREX) 1000 MG tablet, , Disp: , Rfl:    Observations/Objective: Blood pressure 104/82, pulse 82, temperature (!) 97.3 F (36.3 C), temperature source Temporal, height 5\' 10"  (1.778 m), weight 235 lb 8 oz (106.8 kg), last menstrual period 03/03/2019, SpO2 97 %.  Physical Exam Constitutional:      General: She is not in acute distress.    Appearance: Normal appearance. She is well-developed. She is not ill-appearing or toxic-appearing.  HENT:     Head: Normocephalic.     Right Ear: Hearing, tympanic membrane, ear canal and external ear normal. Tympanic membrane is not erythematous, retracted or bulging.     Left Ear: Hearing, tympanic membrane, ear canal and external ear normal. Tympanic membrane is not erythematous, retracted or bulging.     Nose: No mucosal edema or rhinorrhea.     Right Sinus: No maxillary sinus tenderness or frontal sinus tenderness.  Left Sinus: No maxillary sinus tenderness or frontal sinus tenderness.     Mouth/Throat:     Pharynx: Uvula midline.  Eyes:     General: Lids are normal. Lids are everted, no foreign bodies appreciated.     Conjunctiva/sclera: Conjunctivae normal.     Pupils: Pupils are equal, round, and reactive to light.  Neck:     Musculoskeletal: Normal range of motion and neck supple.     Thyroid: No thyroid mass or thyromegaly.     Vascular: No  carotid bruit.     Trachea: Trachea normal.  Cardiovascular:     Rate and Rhythm: Normal rate and regular rhythm.     Pulses: Normal pulses.     Heart sounds: Normal heart sounds, S1 normal and S2 normal. No murmur. No friction rub. No gallop.   Pulmonary:     Effort: Pulmonary effort is normal. No tachypnea or respiratory distress.     Breath sounds: Examination of the right-middle field reveals rhonchi. Examination of the left-middle field reveals rhonchi. Examination of the right-lower field reveals rhonchi. Examination of the left-lower field reveals rhonchi. Rhonchi present. No decreased breath sounds, wheezing or rales.  Abdominal:     General: Bowel sounds are normal.     Palpations: Abdomen is soft.     Tenderness: There is no abdominal tenderness.  Skin:    General: Skin is warm and dry.     Findings: No rash.  Neurological:     Mental Status: She is alert.  Psychiatric:        Mood and Affect: Mood is not anxious or depressed.        Speech: Speech normal.        Behavior: Behavior normal. Behavior is cooperative.        Thought Content: Thought content normal.        Judgment: Judgment normal.      Assessment and Plan   Cough Eval with X-ray given persistence of cough and new productive mucus component.  Concerning for bacterial superinfection/ atypical PNA.Marland Kitchen treat with doxy x 10 days.  Cannot tolerate prednisone.. trial of albuterol as needed.  Rest, fluids.     Eliezer Lofts, MD

## 2019-03-04 DIAGNOSIS — R05 Cough: Secondary | ICD-10-CM | POA: Insufficient documentation

## 2019-03-04 DIAGNOSIS — R059 Cough, unspecified: Secondary | ICD-10-CM | POA: Insufficient documentation

## 2019-03-04 NOTE — Assessment & Plan Note (Signed)
Eval with X-ray given persistence of cough and new productive mucus component.  Concerning for bacterial superinfection/ atypical PNA.Marland Kitchen treat with doxy x 10 days.  Cannot tolerate prednisone.. trial of albuterol as needed.  Rest, fluids.

## 2019-03-13 ENCOUNTER — Telehealth: Payer: Self-pay | Admitting: *Deleted

## 2019-03-13 MED ORDER — AZITHROMYCIN 250 MG PO TABS: ORAL_TABLET | ORAL | 0 refills | Status: DC

## 2019-03-13 NOTE — Telephone Encounter (Signed)
Autumn [name] is still coughing up nasty tasting phelgm that is yellow/greenish.  SOB is gone and patient is feeling better otherwise.  Does she need take antibiotic?  Doxycycline gave her abdominal pain so she stopped it after 3 doses.  Last night she woke up and heard wheezing sound in her lungs.  Used albuterol inhaler once last week and it made her dizzy and gave her a headache.  No other symptoms other than chronic post nasal drainage.  Please advise.

## 2019-03-13 NOTE — Telephone Encounter (Signed)
Daphene notified as instructed by Dr. Diona Browner.  Patient states understanding.

## 2019-03-13 NOTE — Telephone Encounter (Signed)
Yes.. I will send in azithromycin to [facility]. Have her let me know if she has trouble getting a hold of this.

## 2019-03-16 ENCOUNTER — Ambulatory Visit: Payer: Self-pay

## 2019-03-16 ENCOUNTER — Other Ambulatory Visit: Payer: Self-pay

## 2019-03-16 ENCOUNTER — Ambulatory Visit (INDEPENDENT_AMBULATORY_CARE_PROVIDER_SITE_OTHER): Payer: 59 | Admitting: Family Medicine

## 2019-03-16 ENCOUNTER — Encounter: Payer: Self-pay | Admitting: Family Medicine

## 2019-03-16 VITALS — BP 110/84 | HR 91 | Ht 70.0 in | Wt 234.0 lb

## 2019-03-16 DIAGNOSIS — M25561 Pain in right knee: Secondary | ICD-10-CM

## 2019-03-16 DIAGNOSIS — M999 Biomechanical lesion, unspecified: Secondary | ICD-10-CM | POA: Diagnosis not present

## 2019-03-16 DIAGNOSIS — G8929 Other chronic pain: Secondary | ICD-10-CM

## 2019-03-16 DIAGNOSIS — M542 Cervicalgia: Secondary | ICD-10-CM

## 2019-03-16 NOTE — Assessment & Plan Note (Signed)
Stable.  Discussed posture and ergonomics.  Discussed which activities to do which wants to avoid.  Patient will increase activity.  Follow-up again in 4 to 8 weeks

## 2019-03-16 NOTE — Patient Instructions (Signed)
Good to see you.  Hamstring exercises pennsaid pinkie amount topically 2 times daily as needed.  Thigh compression sleeve with a lot of activity, Avoid walking down hills and shortened stride length See me again in 6 weeks for further evaluation and manipulation

## 2019-03-16 NOTE — Progress Notes (Signed)
[facility] [place], [name] [id number] Phone: [phone] Subjective:   Autumn [name], am serving as a scribe for Dr. Hulan Saas.  I'm seeing this patient by the request  of:    CC: Low back, neck pain follow-up  [id number]    08/20/18: Postural as well as likely psychosomatic.  Discussed with patient again in great length about posture and ergonomics.  Patient is following up with primary care provider for recent chest pain.  Patient had a work-up in the emergency department that was ruling out any type of pulmonary embolism.  EKG fairly unremarkable as well.  Do have some underlying anxiety that could be contributing.  Discussed with patient in great length patient does feel like she is responding well to manipulation follow-up again in 6 to 8 weeks  Update- 03/16/19: Autumn [name] is a 33 y.o. female coming in with complaint of neck pain. Is here for OMT to manage her neck pain. Feeling tightness.  Patient states it feels like she needs more manipulation.  Nothing severe.  Starting to affect some daily activities.  Patient states that she is also having right knee pain. Patient has popping in both knees. Woke up on the 17th of September. Had sharp stabbing pain over medial aspect of knee. Had pain for 2 days and she had to use her hands to move the leg. Patient continues to have soreness in the medial patella.      Past Medical History:  Diagnosis Date  . Anxiety   . Back pain    chronic  . Depression   . Disc disease, degenerative, lumbar or lumbosacral   . History of ovarian cyst   . Migraine   . Scoliosis    Past Surgical History:  Procedure Laterality Date  . APPENDECTOMY    . BREAST SURGERY Right 2008   lump/ benign   Social History   Socioeconomic History  . Marital status: Married    Spouse name: Not on file  . Number of children: 1  . Years of education: Not on file  . Highest education level: Not on  file  Occupational History  . Occupation: CMA  Social Needs  . Financial resource strain: Not on file  . Food insecurity    Worry: Not on file    Inability: Not on file  . Transportation needs    Medical: Not on file    Non-medical: Not on file  Tobacco Use  . Smoking status: Former Smoker    Types: Cigarettes    Quit date: 08/18/2016    Years since quitting: 2.5  . Smokeless tobacco: Never Used  . Tobacco comment: 3 cigarettes a day  Substance and Sexual Activity  . Alcohol use: Yes    Alcohol/week: 0.0 standard drinks    Comment: occasional  . Drug use: [name]  . Sexual activity: Yes    Birth control/protection: Pill  Lifestyle  . Physical activity    Days per week: Not on file    Minutes per session: Not on file  . Stress: Not on file  Relationships  . Social Herbalist on phone: Not on file    Gets together: Not on file    Attends religious service: Not on file    Active member of club or organization: Not on file    Attends meetings of clubs or organizations: Not on file    Relationship status: Not on file  Other  Topics Concern  . Not on file  Social History Narrative  . Not on file   Allergies  Allergen Reactions  . Imitrex [Sumatriptan] Other (See Comments)    Vomiting, Slurred Speech and Facial Numbness   Family History  Problem Relation Age of Onset  . Cancer Mother 16       breast  . Breast cancer Mother 81       and 52  . Alcohol abuse Father   . Vision loss Maternal Grandmother   . Heart disease Maternal Grandmother   . Heart failure Maternal Grandmother   . Cancer Maternal Grandfather   . Alcohol abuse Paternal Grandfather   . Arthritis Maternal Aunt     Current Outpatient Medications (Endocrine & Metabolic):  .  norethindrone-ethinyl estradiol 1/35 (ALAYCEN 1/35) tablet, Take 1 tablet by mouth daily.   Current Outpatient Medications (Respiratory):  .  albuterol (VENTOLIN HFA) 108 (90 Base) MCG/ACT inhaler, Inhale 2 puffs into the  lungs every 6 (six) hours as needed for wheezing or shortness of breath.  Current Outpatient Medications (Analgesics):  .  aspirin-acetaminophen-caffeine (EXCEDRIN MIGRAINE) 250-250-65 MG tablet, Take by mouth every 6 (six) hours as needed for headache. .  ibuprofen (ADVIL) 200 MG tablet, Take 200 mg by mouth every 6 (six) hours as needed.   Current Outpatient Medications (Other):  .  azithromycin (ZITHROMAX) 250 MG tablet, 2 tab po x 1 day then 1 tab po daily .  doxycycline (VIBRA-TABS) 100 MG tablet, Take 1 tablet (100 mg total) by mouth 2 (two) times daily. Marland Kitchen  escitalopram (LEXAPRO) 20 MG tablet, Take 1 tablet (20 mg total) by mouth daily. .  valACYclovir (VALTREX) 1000 MG tablet,     Past medical history, social, surgical and family history all reviewed in electronic medical record.  [name] pertanent information unless stated regarding to the chief complaint.   Review of Systems:  [name] headache, visual changes, nausea, vomiting, diarrhea, constipation, dizziness, abdominal pain, skin rash, fevers, chills, night sweats, weight loss, swollen lymph nodes, body aches, joint swelling, muscle aches, chest pain, shortness of breath, mood changes.   Objective  Blood pressure 110/84, pulse 91, height 5\' 10"  (1.778 m), weight 234 lb (106.1 kg), last menstrual period 03/03/2019, SpO2 98 %.    General: [name] apparent distress alert and oriented x3 mood and affect normal, dressed appropriately.  HEENT: Pupils equal, extraocular movements intact  Respiratory: Patient's speak in full sentences and does not appear short of breath  Cardiovascular: [name] lower extremity edema, non tender, [name] erythema  Skin: Warm dry intact with [name] signs of infection or rash on extremities or on axial skeleton.  Abdomen: Soft nontender  Neuro: Cranial nerves II through XII are intact, neurovascularly intact in all extremities with 2+ DTRs and 2+ pulses.  Lymph: [name] lymphadenopathy of posterior or anterior cervical chain or  axillae bilaterally.  Gait normal with good balance and coordination.  MSK:  Non tender with full range of motion and good stability and symmetric strength and tone of shoulders, elbows, wrist, hip and ankles bilaterally.  Right knee exam has full range of motion.  Some tightness of the hamstring.  Tender to palpation over the peds anserine area.  Negative McMurray's   Neck: Inspection mild loss of lordosis. [name] palpable stepoffs. Negative Spurling's maneuver. Mild limited sidebending bilaterally Grip strength and sensation normal in bilateral hands Strength good C4 to T1 distribution [name] sensory change to C4 to T1 Negative Hoffman sign bilaterally Reflexes normal  Osteopathic findings C2  flexed rotated and side bent right C5 flexed rotated and side bent left T6 extended rotated and side bent right inhaled rib  Limited musculoskeletal ultrasound was performed and interpreted by Autumn [name]  Limited ultrasound of patient's knee shows [name] abnormality of the patellofemoral, medial or lateral joint space.  Hypoechoic changes noted in the pes anserine area with some mild intrasubstance tearing of the hamstring. Impression: Pes anserine bursitis    Impression and Recommendations:     This case required medical decision making of moderate complexity. The above documentation has been reviewed and is accurate and complete Autumn Pulley, DO       Note: This dictation was prepared with Dragon dictation along with smaller phrase technology. Any transcriptional errors that result from this process are unintentional.

## 2019-03-16 NOTE — Assessment & Plan Note (Signed)
Decision today to treat with OMT was based on Physical Exam  After verbal consent patient was treated with HVLA, ME, FPR techniques in cervical, thoracic, rib areas  Patient tolerated the procedure well with improvement in symptoms  Patient given exercises, stretches and lifestyle modifications  See medications in patient instructions if given  Patient will follow up in 4-8 weeks 

## 2019-03-27 ENCOUNTER — Other Ambulatory Visit: Payer: Self-pay

## 2019-03-27 ENCOUNTER — Encounter: Payer: Self-pay | Admitting: Family Medicine

## 2019-03-27 ENCOUNTER — Ambulatory Visit (INDEPENDENT_AMBULATORY_CARE_PROVIDER_SITE_OTHER): Payer: 59 | Admitting: Family Medicine

## 2019-03-27 VITALS — BP 110/70 | HR 96 | Temp 99.2°F | Ht 70.0 in | Wt 236.5 lb

## 2019-03-27 DIAGNOSIS — J9801 Acute bronchospasm: Secondary | ICD-10-CM | POA: Diagnosis not present

## 2019-03-27 MED ORDER — QVAR REDIHALER 40 MCG/ACT IN AERB: 2.0000 | INHALATION_SPRAY | Freq: Two times a day (BID) | RESPIRATORY_TRACT | 1 refills | Status: DC

## 2019-03-27 NOTE — Assessment & Plan Note (Signed)
No clear ongoing infection.. most likely reactive airways. Treat with antihistamine and  Nasal steroid. Given cannot tolerate prednisone and albuterol.. will try course of inhaled steroid

## 2019-03-27 NOTE — Patient Instructions (Addendum)
Can try Xyzal or flonse 2 spray per nostril daily. Try trail of inhaled steroid Call if not improving as expected.  Go to ER if severe shortness of breath.

## 2019-03-27 NOTE — Progress Notes (Signed)
Chief Complaint  Patient presents with  . Follow-up    Cough    History of Present Illness: HPI    33 year old female presents  Following viral bronchitis with possible bacterial superinfeciton in mid September.  Treated with prednisone (few days of doxy( did not tolerate), and completed course of  Azithromycin.  She  Is still having moist productive cough, clear sputum.Marland Kitchen 50% better overall.  Still with mild SOB with exerting herself,and wheezing feeling. No fever, no ST, no runny nose, no ear pain.  Not currently taking zyrtec  SE to albuterol and prednisone.   COVID 19 screen No recent travel or known exposure to COVID19 The patient denies respiratory symptoms of COVID 19 at this time.  The importance of social distancing was discussed today.   Review of Systems  Constitutional: Negative for chills and fever.  HENT: Negative for congestion and ear pain.   Eyes: Negative for pain and redness.  Respiratory: Negative for cough and shortness of breath.   Cardiovascular: Negative for chest pain, palpitations and leg swelling.  Gastrointestinal: Negative for abdominal pain, blood in stool, constipation, diarrhea, nausea and vomiting.  Genitourinary: Negative for dysuria.  Musculoskeletal: Negative for falls and myalgias.  Skin: Negative for rash.  Neurological: Negative for dizziness.  Psychiatric/Behavioral: Negative for depression. The patient is not nervous/anxious.       Past Medical History:  Diagnosis Date  . Anxiety   . Back pain    chronic  . Depression   . Disc disease, degenerative, lumbar or lumbosacral   . History of ovarian cyst   . Migraine   . Scoliosis     reports that she quit smoking about 2 years ago. Her smoking use included cigarettes. She has never used smokeless tobacco. She reports current alcohol use. She reports that she does not use drugs.   Current Outpatient Medications:  .  albuterol (VENTOLIN HFA) 108 (90 Base) MCG/ACT inhaler, Inhale  2 puffs into the lungs every 6 (six) hours as needed for wheezing or shortness of breath., Disp: 6.7 g, Rfl: 0 .  aspirin-acetaminophen-caffeine (EXCEDRIN MIGRAINE) 250-250-65 MG tablet, Take by mouth every 6 (six) hours as needed for headache., Disp: , Rfl:  .  escitalopram (LEXAPRO) 20 MG tablet, Take 1 tablet (20 mg total) by mouth daily., Disp: 90 tablet, Rfl: 1 .  ibuprofen (ADVIL) 200 MG tablet, Take 200 mg by mouth every 6 (six) hours as needed., Disp: , Rfl:  .  norethindrone-ethinyl estradiol 1/35 (ALAYCEN 1/35) tablet, Take 1 tablet by mouth daily., Disp: 84 tablet, Rfl: 3 .  valACYclovir (VALTREX) 1000 MG tablet, , Disp: , Rfl:    Observations/Objective: Blood pressure 110/70, pulse 96, temperature 99.2 F (37.3 C), temperature source Oral, height 5\' 10"  (1.778 m), weight 236 lb 8 oz (107.3 kg), last menstrual period 03/03/2019, SpO2 96 %.  Physical Exam Constitutional:      General: She is not in acute distress.    Appearance: Normal appearance. She is well-developed. She is not ill-appearing or toxic-appearing.  HENT:     Head: Normocephalic.     Right Ear: Hearing, tympanic membrane, ear canal and external ear normal. Tympanic membrane is not erythematous, retracted or bulging.     Left Ear: Hearing, tympanic membrane, ear canal and external ear normal. Tympanic membrane is not erythematous, retracted or bulging.     Nose: No mucosal edema or rhinorrhea.     Right Sinus: No maxillary sinus tenderness or frontal sinus tenderness.  Left Sinus: No maxillary sinus tenderness or frontal sinus tenderness.     Mouth/Throat:     Pharynx: Uvula midline.  Eyes:     General: Lids are normal. Lids are everted, no foreign bodies appreciated.     Conjunctiva/sclera: Conjunctivae normal.     Pupils: Pupils are equal, round, and reactive to light.  Neck:     Musculoskeletal: Normal range of motion and neck supple.     Thyroid: No thyroid mass or thyromegaly.     Vascular: No carotid  bruit.     Trachea: Trachea normal.  Cardiovascular:     Rate and Rhythm: Normal rate and regular rhythm.     Pulses: Normal pulses.     Heart sounds: Normal heart sounds, S1 normal and S2 normal. No murmur. No friction rub. No gallop.   Pulmonary:     Effort: Pulmonary effort is normal. No tachypnea or respiratory distress.     Breath sounds: Examination of the right-upper field reveals wheezing. Examination of the left-upper field reveals wheezing. Examination of the right-middle field reveals wheezing. Examination of the left-middle field reveals wheezing. Examination of the right-lower field reveals wheezing. Examination of the left-lower field reveals wheezing. Wheezing present. No decreased breath sounds, rhonchi or rales.  Abdominal:     General: Bowel sounds are normal.     Palpations: Abdomen is soft.     Tenderness: There is no abdominal tenderness.  Skin:    General: Skin is warm and dry.     Findings: No rash.  Neurological:     Mental Status: She is alert.  Psychiatric:        Mood and Affect: Mood is not anxious or depressed.        Speech: Speech normal.        Behavior: Behavior normal. Behavior is cooperative.        Thought Content: Thought content normal.        Judgment: Judgment normal.      Assessment and Plan Post-infection bronchospasm No clear ongoing infection.. most likely reactive airways. Treat with antihistamine and  Nasal steroid. Given cannot tolerate prednisone and albuterol.. will try course of inhaled steroid       Eliezer Lofts, MD

## 2019-04-08 ENCOUNTER — Telehealth: Payer: Self-pay | Admitting: *Deleted

## 2019-04-08 NOTE — Telephone Encounter (Signed)
Autumn [name] states her respiratory symptoms returned over the weekend.  She is having Rattling, wheezing, waking up during the night and post nasal drip is still present. She complains for some SOB.  She is using her inhaler and Flonase daily as instructed. She denies any fever, chills or body aches.  She is wanting to know if she should go ahead and be referred to pulmonary and do you have any further recommendations?  Please advise.

## 2019-04-09 MED ORDER — LEVOCETIRIZINE DIHYDROCHLORIDE 5 MG PO TABS: 5.0000 mg | ORAL_TABLET | Freq: Every evening | ORAL | 3 refills | Status: DC

## 2019-04-09 NOTE — Telephone Encounter (Addendum)
Autumn [name] states she has not been taking Zyrtec or Xyzal.  Will start Xyzal as instructed.  Rx for Xyzal 5 mg sent to [facility] per patient's request.

## 2019-04-09 NOTE — Addendum Note (Signed)
Addended by: Carter Kitten on: 04/09/2019 05:00 PM   Modules accepted: Orders

## 2019-04-09 NOTE — Telephone Encounter (Signed)
Is she taking allergy med? I would take zyrtec  Or Xyzal with the inhaler and flonase.  If already taking let me know.. we can try singuliar in addition ... if not better then.. refer to pulm.

## 2019-04-26 NOTE — Progress Notes (Signed)
[name] [place], [place] [address] Phone: [phone] Subjective:   I Autumn [name] am serving as a Education administrator for Dr. Hulan Saas.    CC: Neck, back pain follow-up as well as knee pain follow-up  [id number]   03/16/2019 Stable.  Discussed posture and ergonomics.  Discussed which activities to do which wants to avoid.  Patient will increase activity.  Follow-up again in 4 to 8 weeks  Update 04/27/2019 Autumn [name] is a 33 y.o. female coming in with complaint of right knee pain and back pain. Patient states she's feeling about the same. Hasn't been doing the exercises and hasn't been able to find a compression sleeve. Posterior knee pain. Stiffness with sitting.        Past Medical History:  Diagnosis Date  . Anxiety   . Back pain    chronic  . Depression   . Disc disease, degenerative, lumbar or lumbosacral   . History of ovarian cyst   . Migraine   . Scoliosis    Past Surgical History:  Procedure Laterality Date  . APPENDECTOMY    . BREAST SURGERY Right 2008   lump/ benign   Social History   Socioeconomic History  . Marital status: Married    Spouse name: Not on file  . Number of children: 1  . Years of education: Not on file  . Highest education level: Not on file  Occupational History  . Occupation: CMA  Social Needs  . Financial resource strain: Not on file  . Food insecurity    Worry: Not on file    Inability: Not on file  . Transportation needs    Medical: Not on file    Non-medical: Not on file  Tobacco Use  . Smoking status: Former Smoker    Types: Cigarettes    Quit date: 08/18/2016    Years since quitting: 2.6  . Smokeless tobacco: Never Used  . Tobacco comment: 3 cigarettes a day  Substance and Sexual Activity  . Alcohol use: Yes    Alcohol/week: 0.0 standard drinks    Comment: occasional  . Drug use: No  . Sexual activity: Yes    Birth control/protection: Pill  Lifestyle  .  Physical activity    Days per week: Not on file    Minutes per session: Not on file  . Stress: Not on file  Relationships  . Social Herbalist on phone: Not on file    Gets together: Not on file    Attends religious service: Not on file    Active member of club or organization: Not on file    Attends meetings of clubs or organizations: Not on file    Relationship status: Not on file  Other Topics Concern  . Not on file  Social History Narrative  . Not on file   Allergies  Allergen Reactions  . Imitrex [Sumatriptan] Other (See Comments)    Vomiting, Slurred Speech and Facial Numbness   Family History  Problem Relation Age of Onset  . Cancer Mother 29       breast  . Breast cancer Mother 43       and 93  . Alcohol abuse Father   . Vision loss Maternal Grandmother   . Heart disease Maternal Grandmother   . Heart failure Maternal Grandmother   . Cancer Maternal Grandfather   . Alcohol abuse Paternal Grandfather   . Arthritis Maternal Aunt  Current Outpatient Medications (Endocrine & Metabolic):  .  norethindrone-ethinyl estradiol 1/35 (ALAYCEN 1/35) tablet, Take 1 tablet by mouth daily.   Current Outpatient Medications (Respiratory):  .  albuterol (VENTOLIN HFA) 108 (90 Base) MCG/ACT inhaler, Inhale 2 puffs into the lungs every 6 (six) hours as needed for wheezing or shortness of breath. .  beclomethasone (QVAR REDIHALER) 40 MCG/ACT inhaler, Inhale 2 puffs into the lungs 2 (two) times daily. Marland Kitchen  levocetirizine (XYZAL) 5 MG tablet, Take 1 tablet (5 mg total) by mouth every evening.  Current Outpatient Medications (Analgesics):  .  aspirin-acetaminophen-caffeine (EXCEDRIN MIGRAINE) 250-250-65 MG tablet, Take by mouth every 6 (six) hours as needed for headache. .  ibuprofen (ADVIL) 200 MG tablet, Take 200 mg by mouth every 6 (six) hours as needed.   Current Outpatient Medications (Other):  .  escitalopram (LEXAPRO) 20 MG tablet, Take 1 tablet (20 mg total) by  mouth daily. .  valACYclovir (VALTREX) 1000 MG tablet,     Past medical history, social, surgical and family history all reviewed in electronic medical record.  No pertanent information unless stated regarding to the chief complaint.   Review of Systems:  No headache, visual changes, nausea, vomiting, diarrhea, constipation, dizziness, abdominal pain, skin rash, fevers, chills, night sweats, weight loss, swollen lymph nodes, body aches, joint swelling, muscle aches, chest pain, shortness of breath, mood changes.   Objective  There were no vitals taken for this visit. Systems examined below as of    General: No apparent distress alert and oriented x3 mood and affect normal, dressed appropriately.  HEENT: Pupils equal, extraocular movements intact  Respiratory: Patient's speak in full sentences and does not appear short of breath  Cardiovascular: No lower extremity edema, non tender, no erythema  Skin: Warm dry intact with no signs of infection or rash on extremities or on axial skeleton.  Abdomen: Soft nontender  Neuro: Cranial nerves II through XII are intact, neurovascularly intact in all extremities with 2+ DTRs and 2+ pulses.  Lymph: No lymphadenopathy of posterior or anterior cervical chain or axillae bilaterally.  Gait normal with good balance and coordination.  MSK:  Non tender with full range of motion and good stability and symmetric strength and tone of shoulders, elbows, wrist, hip and ankles bilaterally.  Neck: Inspection mild loss of lordosis. No palpable stepoffs. Negative Spurling's maneuver. Full neck range of motion Grip strength and sensation normal in bilateral hands Strength good C4 to T1 distribution No sensory change to C4 to T1 Negative Hoffman sign bilaterally Reflexes normal Tightness of the trapezius bilaterally  Back Exam:  Inspection: Unremarkable  Motion: Flexion 45 deg, Extension 25 deg, Side Bending to 35 deg bilaterally,  Rotation to 30 deg  bilaterally  SLR laying: Negative  XSLR laying: Negative  Palpable tenderness: Tender to palpation in the paraspinal musculature.Marland Kitchen FABER: negative. Sensory change: Gross sensation intact to all lumbar and sacral dermatomes.  Reflexes: 2+ at both patellar tendons, 2+ at achilles tendons, Babinski's downgoing.  Strength at foot  Plantar-flexion: 5/5 Dorsi-flexion: 5/5 Eversion: 5/5 Inversion: 5/5  Leg strength  Quad: 5/5 Hamstring: 5/5 Hip flexor: 5/5 Hip abductors: 5/5  Gait unremarkable.  Knee: Right Normal to inspection with no erythema or effusion or obvious bony abnormalities. Palpation normal with no warmth, joint line tenderness, patellar tenderness, or condyle tenderness. ROM full in flexion and extension and lower leg rotation. Ligaments with solid consistent endpoints including ACL, PCL, LCL, MCL. Negative Mcmurray's, Apley's, and Thessalonian tests. Non painful patellar compression. Patellar glide  with moderate crepitus. Patellar and quadriceps tendons unremarkable. Hamstring and quadriceps strength is normal. Contralateral knee unremarkable  Osteopathic findings C2 flexed rotated and side bent right C7 flexed rotated and side bent left T3 extended rotated and side bent right inhaled third rib     Impression and Recommendations:     This case required medical decision making of moderate complexity. The above documentation has been reviewed and is accurate and complete Lyndal Pulley, DO       Note: This dictation was prepared with Dragon dictation along with smaller phrase technology. Any transcriptional errors that result from this process are unintentional.

## 2019-04-27 ENCOUNTER — Ambulatory Visit (INDEPENDENT_AMBULATORY_CARE_PROVIDER_SITE_OTHER): Payer: 59 | Admitting: Family Medicine

## 2019-04-27 ENCOUNTER — Encounter: Payer: Self-pay | Admitting: Family Medicine

## 2019-04-27 ENCOUNTER — Other Ambulatory Visit: Payer: Self-pay

## 2019-04-27 VITALS — BP 110/70 | HR 78 | Ht 70.0 in

## 2019-04-27 DIAGNOSIS — M999 Biomechanical lesion, unspecified: Secondary | ICD-10-CM

## 2019-04-27 DIAGNOSIS — M542 Cervicalgia: Secondary | ICD-10-CM

## 2019-04-27 NOTE — Assessment & Plan Note (Signed)
Neck pain.  Discussed with patient in great length.  Will patient will continue to work on posture and ergonomics, discussed which activities to do which was to avoid.  Patient is to increase activity slowly over the course of next several weeks.  Patient will follow up with me again 2 months.

## 2019-04-27 NOTE — Patient Instructions (Signed)
Good to see you [email or web address] for thigh compression Try to exercise at least  3 times a week Ice is your friend Try the pennsaid See me again in 2 months

## 2019-04-27 NOTE — Assessment & Plan Note (Signed)
Decision today to treat with OMT was based on Physical Exam  After verbal consent patient was treated with HVLA, ME, FPR techniques in cervical, thoracic, rib lumbar and sacral areas  Patient tolerated the procedure well with improvement in symptoms  Patient given exercises, stretches and lifestyle modifications  See medications in patient instructions if given  Patient will follow up in 4-8 weeks 

## 2019-05-03 IMAGING — CT CT ANGIO CHEST
2 of 6 series · 18 of 46 positions shown · IV contrast (APPLIED)
Comparison: Chest x-ray August 15, 2018

CLINICAL DATA: Patient recently started birth control. Concern for
blood clot. PE suspected. Chest x-ray yesterday for right-sided
chest pain.

EXAM:
CT ANGIOGRAPHY CHEST WITH CONTRAST
TECHNIQUE: Multidetector CT imaging of the chest was performed using the
standard protocol during bolus administration of intravenous
contrast. Multiplanar CT image reconstructions and MIPs were
obtained to evaluate the vascular anatomy.
CONTRAST:  75mL OMNIPAQUE IOHEXOL 350 MG/ML SOLN

[Series 5: thins · axial · 0.67mm/px · z∈[-262,-45]mm · 15 of 239 slices shown]
[im 11/239  lung]
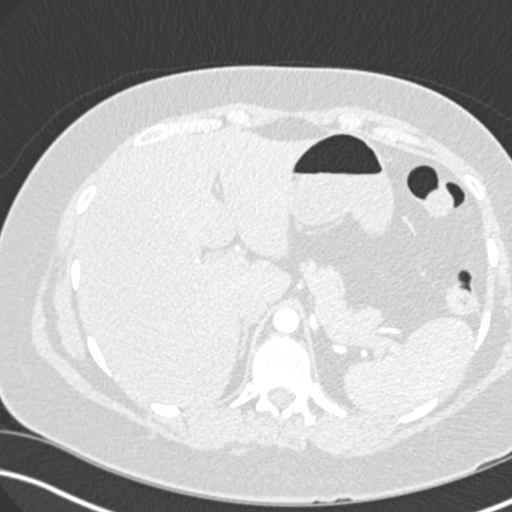
[im 32/239  soft-tissue]
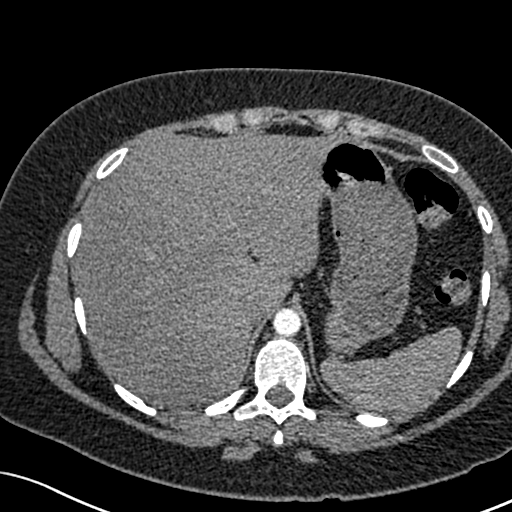
[im 42/239  lung]
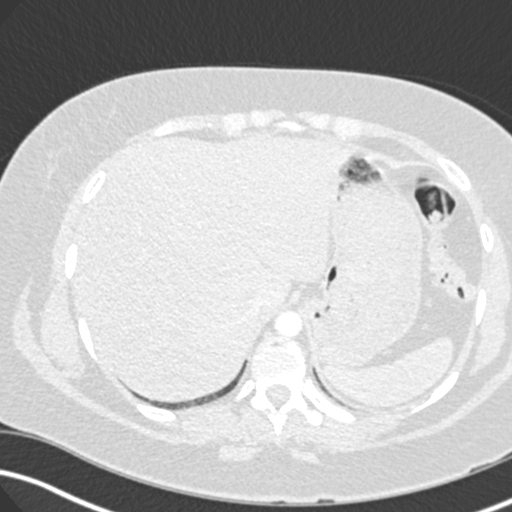
[im 63/239  soft-tissue]
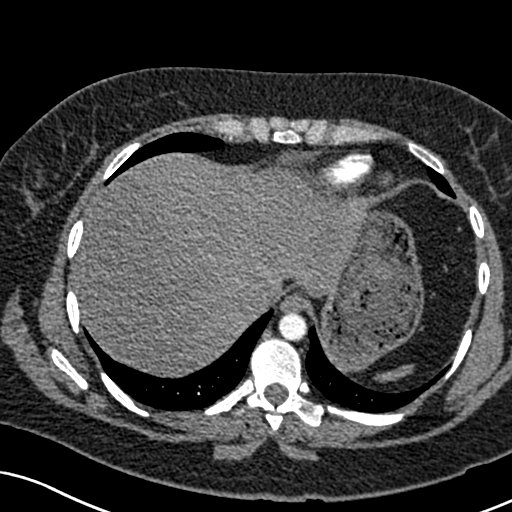
[im 73/239  lung]
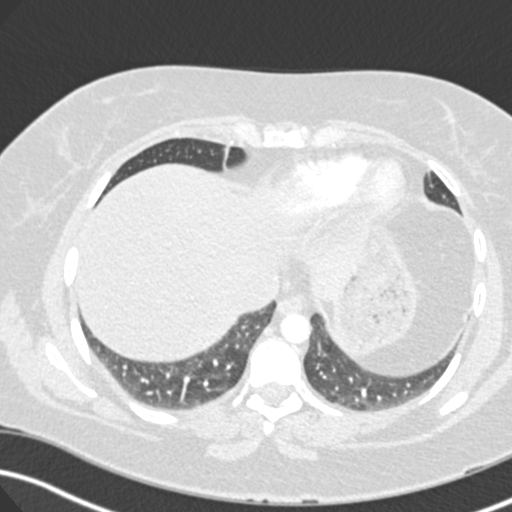
[im 94/239  soft-tissue]
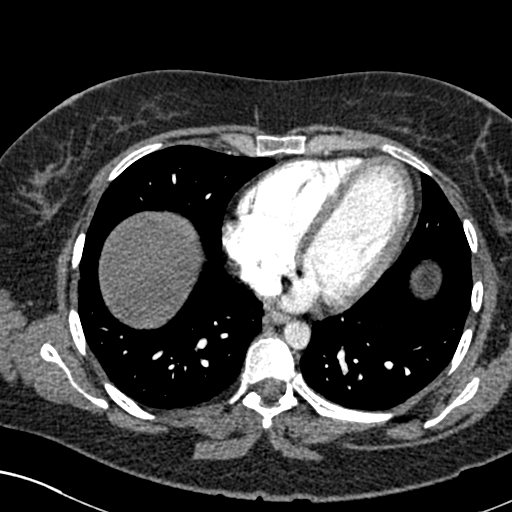
[im 104/239  lung]
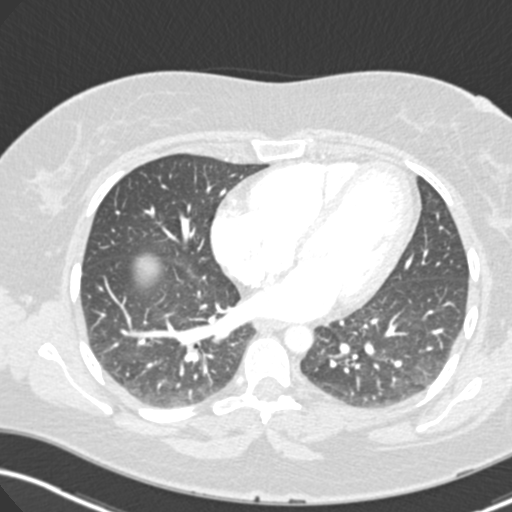
[im 125/239  soft-tissue]
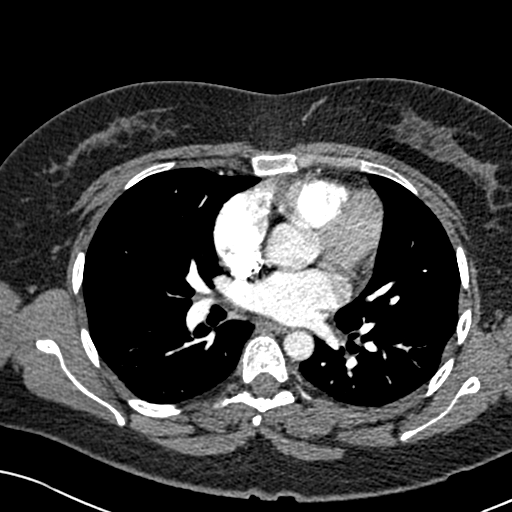
[im 135/239  lung]
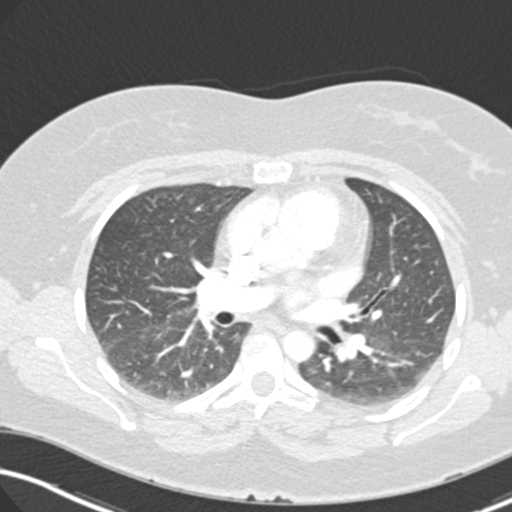
[im 145/239  soft-tissue]
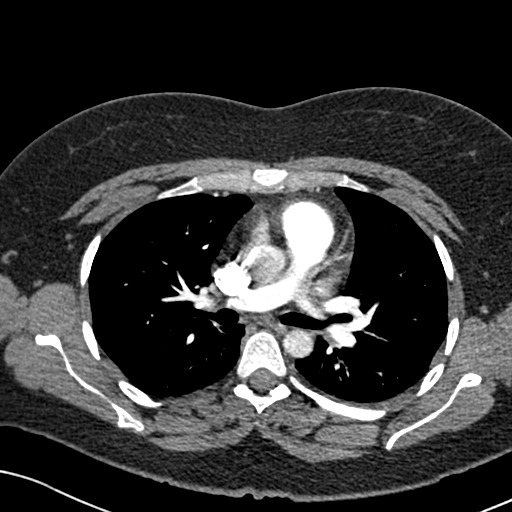
[im 166/239  lung]
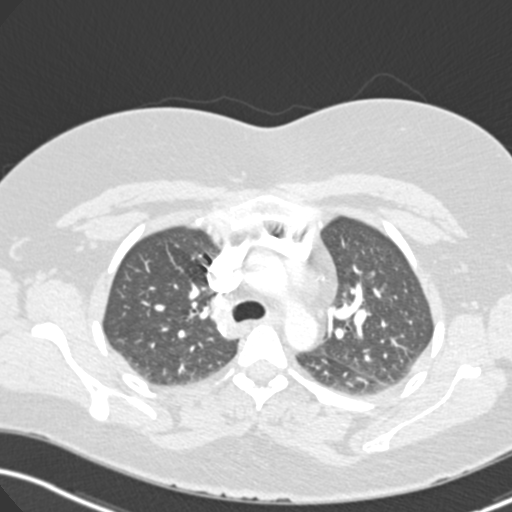
[im 176/239  soft-tissue]
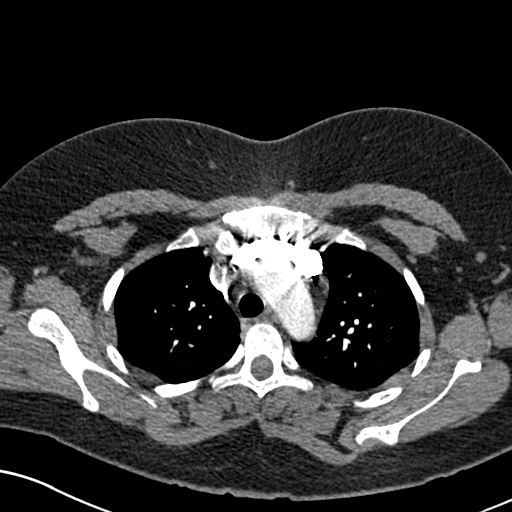
[im 197/239  lung]
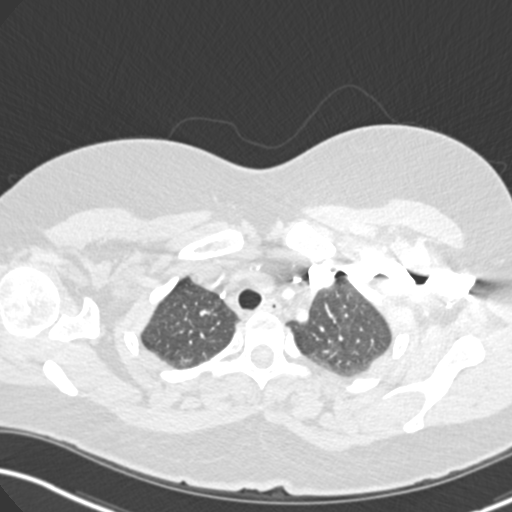
[im 207/239  soft-tissue]
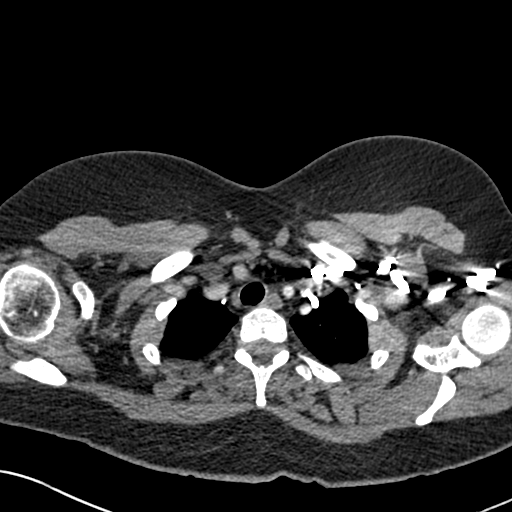
[im 228/239  lung]
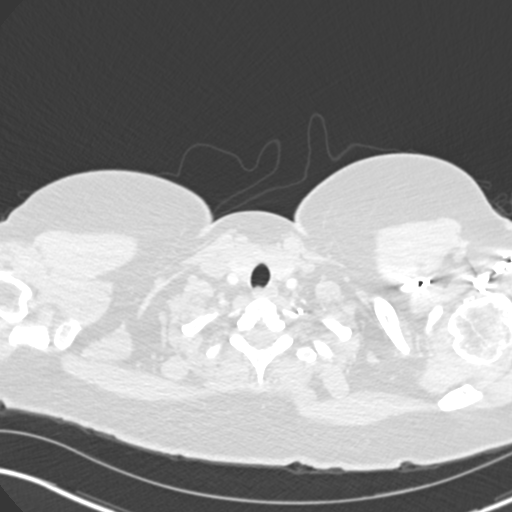

[Series 7: coronal mpr · coronal · 0.49mm/px · 3 of 91 slices shown]
[im 23/91  soft-tissue]
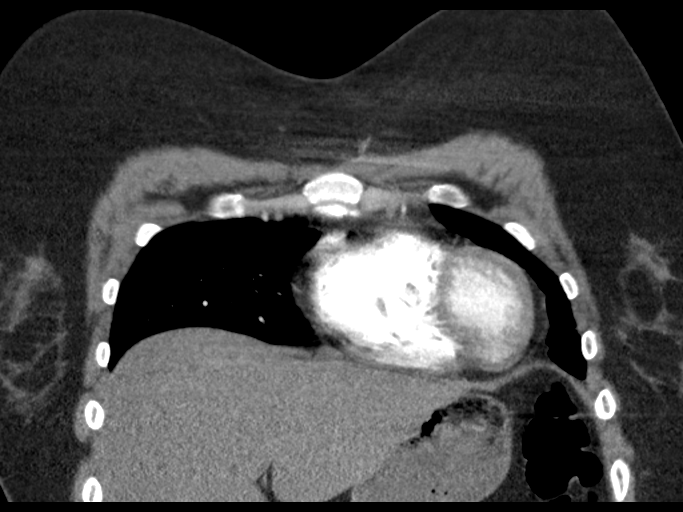
[im 46/91  soft-tissue]
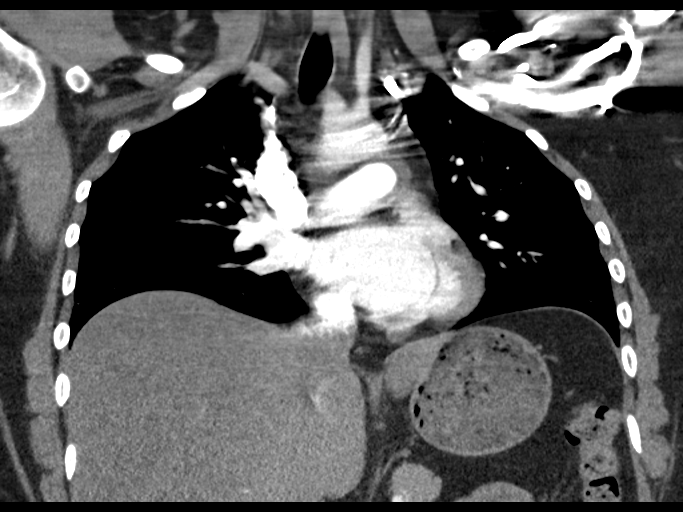
[im 68/91  soft-tissue]
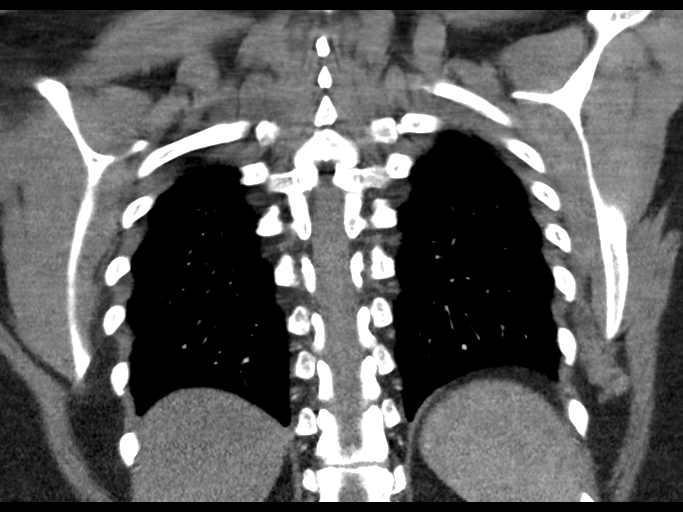

[18 of 46 positions shown; findings below may reference images not displayed]

FINDINGS: Cardiovascular: Satisfactory opacification of the pulmonary arteries
to the segmental level. No evidence of pulmonary embolism. Normal
heart size. No pericardial effusion.

Mediastinum/Nodes: No enlarged mediastinal, hilar, or axillary lymph
nodes. Thyroid gland, trachea, and esophagus demonstrate no
significant findings.

Lungs/Pleura: There is a 5 mm nodule in the anterior right lung on
series 6, image 45. No other nodules. No masses or focal
infiltrates. No evidence of pneumonia.

Upper Abdomen: No acute abnormality.

Musculoskeletal: No chest wall abnormality. No acute or significant
osseous findings.

Review of the MIP images confirms the above findings.
IMPRESSION: 1. No pulmonary emboli.  No acute abnormalities.
2. 5 mm nodule in the anterior right lung. No follow-up needed if
patient is low-risk. Non-contrast chest CT can be considered in 12
months if patient is high-risk. This recommendation follows the
consensus statement: Guidelines for Management of Incidental
Pulmonary Nodules Detected on CT Images: From the [HOSPITAL]

## 2019-05-18 ENCOUNTER — Telehealth: Payer: Self-pay | Admitting: *Deleted

## 2019-05-18 ENCOUNTER — Encounter: Payer: Self-pay | Admitting: Family Medicine

## 2019-05-18 ENCOUNTER — Other Ambulatory Visit: Payer: Self-pay | Admitting: *Deleted

## 2019-05-18 ENCOUNTER — Ambulatory Visit (INDEPENDENT_AMBULATORY_CARE_PROVIDER_SITE_OTHER): Payer: 59 | Admitting: Family Medicine

## 2019-05-18 VITALS — BP 100/80 | HR 75 | Temp 97.4°F | Ht 70.0 in | Wt 237.0 lb

## 2019-05-18 DIAGNOSIS — R350 Frequency of micturition: Secondary | ICD-10-CM | POA: Diagnosis not present

## 2019-05-18 DIAGNOSIS — N3020 Other chronic cystitis without hematuria: Secondary | ICD-10-CM | POA: Diagnosis not present

## 2019-05-18 LAB — POC URINALSYSI DIPSTICK (AUTOMATED)
Bilirubin, UA: NEGATIVE
Blood, UA: NEGATIVE
Glucose, UA: NEGATIVE
Ketones, UA: NEGATIVE
Leukocytes, UA: NEGATIVE
Nitrite, UA: NEGATIVE
Protein, UA: NEGATIVE
Spec Grav, UA: 1.02 (ref 1.010–1.025)
Urobilinogen, UA: 0.2 E.U./dL
pH, UA: 6 (ref 5.0–8.0)

## 2019-05-18 MED ORDER — ALBUTEROL SULFATE HFA 108 (90 BASE) MCG/ACT IN AERS: 2.0000 | INHALATION_SPRAY | Freq: Four times a day (QID) | RESPIRATORY_TRACT | 0 refills | Status: DC | PRN

## 2019-05-18 NOTE — Progress Notes (Signed)
   Subjective:     Autumn [name] is a 33 y.o. female presenting for Urinary Frequency (x 1 week)     Urinary Frequency  This is a new problem. The current episode started in the past 7 days. Associated symptoms include frequency and urgency. Pertinent negatives include no chills, nausea or vomiting.   Lots of abdominal pressure  Recent negative pregnancy test  Hx of trichomonas   Review of Systems  Constitutional: Negative for chills and fever.  Gastrointestinal: Negative for nausea and vomiting.  Genitourinary: Positive for dysuria, frequency and urgency. Negative for vaginal discharge and vaginal pain.       Odor     Social History   Tobacco Use  Smoking Status Former Smoker  . Types: Cigarettes  . Quit date: 08/18/2016  . Years since quitting: 2.7  Smokeless Tobacco Never Used  Tobacco Comment   3 cigarettes a day        Objective:    BP Readings from Last 3 Encounters:  05/18/19 100/80  04/27/19 110/70  03/27/19 110/70   Wt Readings from Last 3 Encounters:  05/18/19 237 lb (107.5 kg)  03/27/19 236 lb 8 oz (107.3 kg)  03/16/19 234 lb (106.1 kg)    BP 100/80   Pulse 75   Temp (!) 97.4 F (36.3 C) (Temporal)   Ht 5\' 10"  (1.778 m)   Wt 237 lb (107.5 kg)   LMP 04/27/2019 (Approximate)   SpO2 96%   BMI 34.01 kg/m    Physical Exam Constitutional:      General: She is not in acute distress.    Appearance: She is well-developed. She is not diaphoretic.  HENT:     Right Ear: External ear normal.     Left Ear: External ear normal.     Nose: Nose normal.  Eyes:     Conjunctiva/sclera: Conjunctivae normal.  Neck:     Musculoskeletal: Neck supple.  Cardiovascular:     Rate and Rhythm: Normal rate and regular rhythm.     Heart sounds: No murmur.  Pulmonary:     Effort: Pulmonary effort is normal.  Abdominal:     General: Abdomen is flat. There is no distension.     Palpations: Abdomen is soft.     Tenderness: There is no abdominal  tenderness. There is no guarding or rebound.     Comments: Notes some pressure symptoms w/ palpation  Skin:    General: Skin is warm and dry.     Capillary Refill: Capillary refill takes less than 2 seconds.  Neurological:     Mental Status: She is alert. Mental status is at baseline.  Psychiatric:        Mood and Affect: Mood normal.        Behavior: Behavior normal.    UA: normal       Assessment & Plan:   Problem List Items Addressed This Visit    None    Visit Diagnoses    Urinary frequency    -  Primary   Relevant Orders   POCT Urinalysis Dipstick (Automated) (Completed)   WET PREP BY MOLECULAR PROBE   Urine Culture     UA was normal, however, in June with similar symptoms was positive for Trichomonas. Will send wet prep. Will also send for Urine culture on off chance that UA was not accurate.   F/u wet prep  Return if symptoms worsen or fail to improve.  [name], MD

## 2019-05-18 NOTE — Telephone Encounter (Signed)
Since Dr. Deborra Medina is leaving pt requested that I send a note asking to establish care with Dr. Glori Bickers. Pt requested Dr. Glori Bickers specifically she said she's her 1st choice.

## 2019-05-19 ENCOUNTER — Encounter: Payer: Self-pay | Admitting: Family Medicine

## 2019-05-19 ENCOUNTER — Telehealth: Payer: Self-pay

## 2019-05-19 LAB — WET PREP BY MOLECULAR PROBE
Candida species: NOT DETECTED
Gardnerella vaginalis: NOT DETECTED
MICRO NUMBER:: 1170594
SPECIMEN QUALITY:: ADEQUATE
Trichomonas vaginosis: NOT DETECTED

## 2019-05-19 LAB — URINE CULTURE
MICRO NUMBER:: 1170595
SPECIMEN QUALITY:: ADEQUATE

## 2019-05-19 NOTE — Telephone Encounter (Signed)
Pt wants appt this week for vaginal pain & Frequency Pt states UA normal, wet prep normal. Pressure discomfort burning. PCP has no recommendations. Pt states something is wrong pt wants seen this week.

## 2019-05-19 NOTE — Telephone Encounter (Signed)
I'm not taking new patients but if she cannot find anyone else that is ok

## 2019-05-19 NOTE — Telephone Encounter (Signed)
Noted. Patient aware 

## 2019-05-19 NOTE — Telephone Encounter (Signed)
Scheduled patient to come in Thursday morning.

## 2019-05-19 NOTE — Telephone Encounter (Signed)
Absolutely, I'm happy to take her as a patient!

## 2019-05-19 NOTE — Telephone Encounter (Signed)
Pt is aware that Dr Glori Bickers is not taking on new patients at this time.   Pt has requested to transfer care to Allie Bossier.   Please advise Anda Kraft for documentation purposes. Thanks!

## 2019-05-20 ENCOUNTER — Encounter: Payer: Self-pay | Admitting: Family Medicine

## 2019-05-21 ENCOUNTER — Ambulatory Visit (INDEPENDENT_AMBULATORY_CARE_PROVIDER_SITE_OTHER): Payer: 59 | Admitting: Obstetrics and Gynecology

## 2019-05-21 ENCOUNTER — Encounter: Payer: Self-pay | Admitting: Obstetrics and Gynecology

## 2019-05-21 ENCOUNTER — Other Ambulatory Visit (HOSPITAL_COMMUNITY)
Admission: RE | Admit: 2019-05-21 | Discharge: 2019-05-21 | Disposition: A | Discharge status: Home / Self Care | Payer: 59 | Source: Ambulatory Visit | Attending: Obstetrics and Gynecology | Admitting: Obstetrics and Gynecology

## 2019-05-21 ENCOUNTER — Ambulatory Visit (INDEPENDENT_AMBULATORY_CARE_PROVIDER_SITE_OTHER): Payer: 59

## 2019-05-21 ENCOUNTER — Other Ambulatory Visit: Payer: Self-pay

## 2019-05-21 VITALS — BP 120/84 | HR 76 | Ht 69.75 in | Wt 237.7 lb

## 2019-05-21 DIAGNOSIS — R102 Pelvic and perineal pain: Secondary | ICD-10-CM | POA: Insufficient documentation

## 2019-05-21 NOTE — Progress Notes (Signed)
Patient comes in today for pelvic pressure.

## 2019-05-21 NOTE — Progress Notes (Signed)
HPI:      [name] is a 33 y.o. G1P1 who LMP was Patient's last menstrual period was 04/27/2019 (approximate).  Subjective:   She presents today complaining of sudden onset of pelvic pressure.  She feels a constant pelvic pressure that is intermittently worse.  She points to the midline suprapubic area.  States she also has occasional left-sided pelvic pain. Patient continues to take OCPs and has normal regular cycles. Reports no unusual bleeding. Reports no new sexual partners and has no doubts about her current partner. (She does state that a different doctor diagnosed her with trichomonas approximately 6 months ago -this was treated)  She had a urine culture and sensitivity performed 2 days ago which was negative.    Hx: The following portions of the patient's history were reviewed and updated as appropriate:             She  has a past medical history of Anxiety, Back pain, Depression, Disc disease, degenerative, lumbar or lumbosacral, History of ovarian cyst, Migraine, and Scoliosis. She does not have any pertinent problems on file. She  has a past surgical history that includes Appendectomy and Breast surgery (Right, 2008). Her family history includes Alcohol abuse in her father and paternal grandfather; Arthritis in her maternal aunt; Breast cancer (age of onset: 77) in her mother; Cancer in her maternal grandfather; Cancer (age of onset: 26) in her mother; Heart disease in her maternal grandmother; Heart failure in her maternal grandmother; Vision loss in her maternal grandmother. She  reports that she quit smoking about 2 years ago. Her smoking use included cigarettes. She has never used smokeless tobacco. She reports current alcohol use. She reports that she does not use drugs. She has a current medication list which includes the following prescription(s): aspirin-acetaminophen-caffeine, qvar redihaler, escitalopram, ibuprofen, levocetirizine, norethindrone-ethinyl  estradiol 1/35, valacyclovir, and albuterol. She is allergic to imitrex [sumatriptan].       Review of Systems:  Review of Systems  Constitutional: Denied constitutional symptoms, night sweats, recent illness, fatigue, fever, insomnia and weight loss.  Eyes: Denied eye symptoms, eye pain, photophobia, vision change and visual disturbance.  Ears/Nose/Throat/Neck: Denied ear, nose, throat or neck symptoms, hearing loss, nasal discharge, sinus congestion and sore throat.  Cardiovascular: Denied cardiovascular symptoms, arrhythmia, chest pain/pressure, edema, exercise intolerance, orthopnea and palpitations.  Respiratory: Denied pulmonary symptoms, asthma, pleuritic pain, productive sputum, cough, dyspnea and wheezing.  Gastrointestinal: Denied, gastro-esophageal reflux, melena, nausea and vomiting.  Genitourinary: See HPI for additional information.  Musculoskeletal: Denied musculoskeletal symptoms, stiffness, swelling, muscle weakness and myalgia.  Dermatologic: Denied dermatology symptoms, rash and scar.  Neurologic: Denied neurology symptoms, dizziness, headache, neck pain and syncope.  Psychiatric: Denied psychiatric symptoms, anxiety and depression.  Endocrine: Denied endocrine symptoms including hot flashes and night sweats.   Meds:   Current Outpatient Medications on File Prior to Visit  Medication Sig Dispense Refill  . aspirin-acetaminophen-caffeine (EXCEDRIN MIGRAINE) 250-250-65 MG tablet Take by mouth every 6 (six) hours as needed for headache.    . beclomethasone (QVAR REDIHALER) 40 MCG/ACT inhaler Inhale 2 puffs into the lungs 2 (two) times daily. 10.6 g 1  . escitalopram (LEXAPRO) 20 MG tablet Take 1 tablet (20 mg total) by mouth daily. 90 tablet 1  . ibuprofen (ADVIL) 200 MG tablet Take 200 mg by mouth every 6 (six) hours as needed.    Marland Kitchen levocetirizine (XYZAL) 5 MG tablet Take 1 tablet (5 mg total) by mouth every evening. 90 tablet 3  .  norethindrone-ethinyl estradiol 1/35  (ALAYCEN 1/35) tablet Take 1 tablet by mouth daily. 84 tablet 3  . valACYclovir (VALTREX) 1000 MG tablet     . albuterol (VENTOLIN HFA) 108 (90 Base) MCG/ACT inhaler Inhale 2 puffs into the lungs every 6 (six) hours as needed for wheezing or shortness of breath. (Patient not taking: Reported on 05/21/2019) 6.7 g 0   No current facility-administered medications on file prior to visit.    Objective:     Vitals:   05/21/19 0805  BP: 120/84  Pulse: 76              Physical examination   Pelvic:   Vulva: Normal appearance.  No lesions.  Vagina: No lesions or abnormalities noted.  Support: Normal pelvic support.  Urethra No masses tenderness or scarring.  Meatus Normal size without lesions or prolapse.  Cervix: Normal appearance.  No lesions.  Anus: Normal exam.  No lesions.  Perineum: Normal exam.  No lesions.        Bimanual   Uterus: Normal size.  Non-tender.  Mobile.  AV.  Adnexae: No masses.  Non-tender to palpation.  Cul-de-sac: Negative for abnormality.   WET PREP: clue cells: absent, KOH (yeast): negative, odor: absent and trichomoniasis: negative Ph:  < 4.5   Assessment:    G1P1 Patient Active Problem List   Diagnosis Date Noted  . Post-infection bronchospasm 03/27/2019  . Cough 03/04/2019  . Urine frequency 11/27/2018  . Vaginitis 11/27/2018  . Fever blister 11/20/2018  . Facial rash 11/20/2018  . Myalgia 08/21/2018  . Shingles 06/20/2018  . Neck pain 04/16/2018  . Nonallopathic lesion of cervical region 04/16/2018  . Nonallopathic lesion of rib cage 04/16/2018  . Nonallopathic lesion of thoracic region 04/16/2018  . Epigastric discomfort 09/02/2017  . Contraception management 08/30/2016  . Family history of breast cancer in mother 08/30/2016  . Well woman exam with routine gynecological exam 08/19/2014  . Obesity (BMI 30.0-34.9) 08/19/2014  . Amenorrhea 12/25/2013  . Dysuria 03/07/2013  . Anxiety and depression 07/15/2012     1. Pelvic pressure in  female     Negative wet prep.  No evidence of cystocele.  Recent negative C&S     Plan:            1.  Ultrasound to rule out ovarian cysts or other pelvic issues.  2.  Cultures sent for GC/CT-trichomonas etc. Orders No orders of the defined types were placed in this encounter.   No orders of the defined types were placed in this encounter.     F/U  Return for We will contact her with any abnormal test results. I spent 27 minutes involved in the care of this patient of which greater than 50% was spent discussing pelvic infection, pelvic pressure symptoms caused by ovarian cysts, trichomonas and associated STDs, prolapse symptoms.  All questions answered  Finis Bud, M.D. 05/21/2019 8:47 AM

## 2019-05-21 NOTE — Addendum Note (Signed)
Addended by: Durwin Glaze on: 05/21/2019 09:33 AM   Modules accepted: Orders

## 2019-05-22 LAB — CERVICOVAGINAL ANCILLARY ONLY
Bacterial Vaginitis (gardnerella): NEGATIVE
Candida Glabrata: NEGATIVE
Candida Vaginitis: NEGATIVE
Chlamydia: NEGATIVE
Comment: NEGATIVE
Comment: NEGATIVE
Comment: NEGATIVE
Comment: NEGATIVE
Comment: NEGATIVE
Comment: NORMAL
Neisseria Gonorrhea: NEGATIVE
Trichomonas: NEGATIVE

## 2019-05-26 ENCOUNTER — Telehealth: Payer: Self-pay

## 2019-05-26 NOTE — Telephone Encounter (Signed)
Pt calling for ultrasound results from last Thursday. Please return call to pt.

## 2019-05-26 NOTE — Telephone Encounter (Signed)
Please advise on results

## 2019-05-28 NOTE — Telephone Encounter (Signed)
Notified patient of lab results.  Patient verbalized understanding.  

## 2019-05-29 ENCOUNTER — Ambulatory Visit: Payer: Self-pay | Admitting: *Deleted

## 2019-06-09 ENCOUNTER — Telehealth: Payer: Self-pay | Admitting: *Deleted

## 2019-06-09 DIAGNOSIS — R350 Frequency of micturition: Secondary | ICD-10-CM

## 2019-06-09 DIAGNOSIS — R300 Dysuria: Secondary | ICD-10-CM

## 2019-06-09 NOTE — Addendum Note (Signed)
Addended by: [name] on: 06/09/2019 02:24 PM   Modules accepted: Orders

## 2019-06-09 NOTE — Telephone Encounter (Signed)
Autumn [name] states burning with urination is not any better.  GYN work up was normal.  Please place referral to [facility] for further work up.

## 2019-06-09 NOTE — Telephone Encounter (Signed)
Referral placed.

## 2019-06-24 ENCOUNTER — Encounter: Payer: Self-pay | Admitting: Family Medicine

## 2019-06-24 ENCOUNTER — Ambulatory Visit: Payer: 59 | Admitting: Family Medicine

## 2019-06-24 ENCOUNTER — Other Ambulatory Visit: Payer: Self-pay

## 2019-06-24 VITALS — BP 118/80 | HR 68 | Temp 98.0°F | Ht 70.0 in | Wt 239.5 lb

## 2019-06-24 DIAGNOSIS — M659 Synovitis and tenosynovitis, unspecified: Secondary | ICD-10-CM | POA: Diagnosis not present

## 2019-06-24 DIAGNOSIS — M79675 Pain in left toe(s): Secondary | ICD-10-CM | POA: Diagnosis not present

## 2019-06-24 NOTE — Progress Notes (Signed)
Anahis Furgeson T. Zekiah Coen, MD Primary Care and [place] at [facility] [place], [address] Phone: [phone]  FAX: [phone]  Autumn [name] - 34 y.o. female  MRN [id number]  Date of Birth: 1986-01-23  Visit Date: 06/24/2019  PCP: Lucille Passy, MD  Referred by: Lucille Passy, MD  Chief Complaint  Patient presents with  . Toe Pain    Left Big Toe    This visit occurred during the SARS-CoV-2 public health emergency.  Safety protocols were in place, including screening questions prior to the visit, additional usage of staff PPE, and extensive cleaning of exam room while observing appropriate contact time as indicated for disinfecting solutions.   Subjective:   Autumn [name] is a 34 y.o. very pleasant female patient with Body mass index is 34.36 kg/m. who presents with the following:  She presents with a history of some ongoing L great toe pain.  About three days and then it felt bad  Sometimes with walking and stepping.   Bottom of the foot has some burning. No trauma.  No red or hot, warm.  This is on the MTP joint on the left.  Otherwise there is minimal forefoot pain.  No pain at the ankle and no pain in the midfoot.  L great toe synovitis  Past Medical History, Surgical History, Social History, Family History, Problem List, Medications, and Allergies have been reviewed and updated if relevant.   GEN: No fevers, chills. Nontoxic. Primarily MSK c/o today. MSK: Detailed in the HPI GI: tolerating PO intake without difficulty Neuro: No numbness, parasthesias, or tingling associated. Otherwise the pertinent positives of the ROS are noted above.   Objective:   BP 118/80   Pulse 68   Temp 98 F (36.7 C) (Temporal)   Ht 5\' 10"  (1.778 m)   Wt 239 lb 8 oz (108.6 kg)   LMP 06/24/2019   SpO2 97%   BMI 34.36 kg/m    GEN: WDWN, NAD, Non-toxic, Alert & Oriented x 3 HEENT: Atraumatic, Normocephalic.    Ears and Nose: No external deformity. EXTR: No clubbing/cyanosis/edema NEURO: Normal gait.  PSYCH: Normally interactive. Conversant. Not depressed or anxious appearing.  Calm demeanor.    The left ankle is entirely nontender.  The bony anatomy of the midfoot is entirely nontender.  There is modest tenderness on metatarsal shafts 3 and 4.  Otherwise all phalanges are nontender and the metatarsal shafts are nontender.  She does have some tenderness at the first toe at the MTP joint with forced flexion and extension.  She also has some decreased sensation in this region.  This is secondary to prior trauma.  Radiology: No results found.  Assessment and Plan:     ICD-10-CM   1. Great toe pain, left  M79.675   2. Synovitis of toe  M65.9    Medical decision-making in this case is LOW.   I made her a first ray post out of [place].  She has a stiff shoe from [place] and I think that this will be all that she needs to do.  Continue this for the next 2 weeks.  She can discuss this with me if she continues to have symptoms.  Follow-up: No follow-ups on file.  No orders of the defined types were placed in this encounter.  No orders of the defined types were placed in this encounter.   Signed,  Maud Deed. Mkenzie Dotts, MD   Outpatient Encounter  Medications as of 06/24/2019  Medication Sig  . aspirin-acetaminophen-caffeine (EXCEDRIN MIGRAINE) 250-250-65 MG tablet Take by mouth every 6 (six) hours as needed for headache.  . beclomethasone (QVAR REDIHALER) 40 MCG/ACT inhaler Inhale 2 puffs into the lungs 2 (two) times daily.  Marland Kitchen escitalopram (LEXAPRO) 20 MG tablet Take 1 tablet (20 mg total) by mouth daily.  Marland Kitchen ibuprofen (ADVIL) 200 MG tablet Take 200 mg by mouth every 6 (six) hours as needed.  Marland Kitchen levocetirizine (XYZAL) 5 MG tablet Take 1 tablet (5 mg total) by mouth every evening.  . norethindrone-ethinyl estradiol 1/35 (ALAYCEN 1/35) tablet Take 1 tablet by mouth daily.  . valACYclovir (VALTREX) 1000  MG tablet   . [DISCONTINUED] albuterol (VENTOLIN HFA) 108 (90 Base) MCG/ACT inhaler Inhale 2 puffs into the lungs every 6 (six) hours as needed for wheezing or shortness of breath. (Patient not taking: Reported on 05/21/2019)   No facility-administered encounter medications on file as of 06/24/2019.

## 2019-06-25 ENCOUNTER — Other Ambulatory Visit: Payer: Self-pay | Admitting: Family Medicine

## 2019-06-30 ENCOUNTER — Encounter: Payer: Self-pay | Admitting: Primary Care

## 2019-06-30 ENCOUNTER — Other Ambulatory Visit: Payer: Self-pay

## 2019-06-30 ENCOUNTER — Ambulatory Visit: Payer: 59 | Admitting: Primary Care

## 2019-06-30 VITALS — BP 100/64 | HR 70 | Temp 97.8°F | Ht 69.5 in | Wt 237.5 lb

## 2019-06-30 DIAGNOSIS — J9801 Acute bronchospasm: Secondary | ICD-10-CM

## 2019-06-30 DIAGNOSIS — M549 Dorsalgia, unspecified: Secondary | ICD-10-CM

## 2019-06-30 DIAGNOSIS — M542 Cervicalgia: Secondary | ICD-10-CM

## 2019-06-30 DIAGNOSIS — F329 Major depressive disorder, single episode, unspecified: Secondary | ICD-10-CM

## 2019-06-30 DIAGNOSIS — G8929 Other chronic pain: Secondary | ICD-10-CM | POA: Diagnosis not present

## 2019-06-30 DIAGNOSIS — Z1231 Encounter for screening mammogram for malignant neoplasm of breast: Secondary | ICD-10-CM

## 2019-06-30 DIAGNOSIS — Z803 Family history of malignant neoplasm of breast: Secondary | ICD-10-CM

## 2019-06-30 DIAGNOSIS — F419 Anxiety disorder, unspecified: Secondary | ICD-10-CM

## 2019-06-30 DIAGNOSIS — R202 Paresthesia of skin: Secondary | ICD-10-CM

## 2019-06-30 DIAGNOSIS — B009 Herpesviral infection, unspecified: Secondary | ICD-10-CM | POA: Diagnosis not present

## 2019-06-30 HISTORY — DX: Paresthesia of skin: R20.2

## 2019-06-30 LAB — HEMOGLOBIN A1C: Hgb A1c MFr Bld: 5.8 % (ref 4.6–6.5)

## 2019-06-30 LAB — COMPREHENSIVE METABOLIC PANEL
ALT: 38 U/L — ABNORMAL HIGH (ref 0–35)
AST: 25 U/L (ref 0–37)
Albumin: 4.4 g/dL (ref 3.5–5.2)
Alkaline Phosphatase: 59 U/L (ref 39–117)
BUN: 15 mg/dL (ref 6–23)
CO2: 27 mEq/L (ref 19–32)
Calcium: 9.6 mg/dL (ref 8.4–10.5)
Chloride: 102 mEq/L (ref 96–112)
Creatinine, Ser: 0.86 mg/dL (ref 0.40–1.20)
GFR: 75.85 mL/min (ref >60.00)
Glucose, Bld: 90 mg/dL (ref 70–99)
Potassium: 4.3 mEq/L (ref 3.5–5.1)
Sodium: 137 mEq/L (ref 135–145)
Total Bilirubin: 0.4 mg/dL (ref 0.2–1.2)
Total Protein: 7.7 g/dL (ref 6.0–8.3)

## 2019-06-30 LAB — CBC
HCT: 44.3 % (ref 36.0–46.0)
Hemoglobin: 15 g/dL (ref 12.0–15.0)
MCHC: 33.9 g/dL (ref 30.0–36.0)
MCV: 90.7 fl (ref 78.0–100.0)
Platelets: 336 10*3/uL (ref 150.0–400.0)
RBC: 4.89 Mil/uL (ref 3.87–5.11)
RDW: 12.6 % (ref 11.5–15.5)
WBC: 7.2 10*3/uL (ref 4.0–10.5)

## 2019-06-30 LAB — VITAMIN B12: Vitamin B-12: 328 pg/mL (ref 211–911)

## 2019-06-30 NOTE — Assessment & Plan Note (Signed)
Doubt heel spur as she doesn't have symptoms with weight bearing. Check labs today to rule out other causes. Suspect this to be nerve related, she will check with sports medicine.

## 2019-06-30 NOTE — Progress Notes (Signed)
Subjective:    Patient ID: Autumn [name], female    DOB: 1985/08/11, 34 y.o.   MRN: [id number]  HPI  This visit occurred during the SARS-CoV-2 public health emergency.  Safety protocols were in place, including screening questions prior to the visit, additional usage of staff PPE, and extensive cleaning of exam room while observing appropriate contact time as indicated for disinfecting solutions.   Autumn [name] is a 34 year old female who presents today to transfer care from Dr. Deborra Medina.  1) Anxiety and Depression: Currently managed on Lexapro 20 mg, feels well managed on this dose. Denies SI/HI. She does have decreased libido. She's tried Wellbutrin, Effexor, Zoloft in the past and has found that she does best on Lexapro.  2) Family History of Breast Cancer: In Mother who was diagnosed at the age of 40. The patient underwent diagnostic mammogram and ultrasound in March 2020 due to focal upper right breast pain, imaging was without suspicious masses, there were scattered areas of fibroglandular density. Given the age of her mother at diagnosis it was recommended she start annual screenings. She is due in March of 2021.  3) Post infection Bronchospasm: Respiratory infection in September 2020, has had intermittent cough with wheeze since. Initially was treated with albuterol but she experienced palpitations, severe headache, felt jittery so she was transitioned to Qvar. Every several weeks in the evening she's noticed wheezing with cough, drainage so she'll use Qvar for short periods of time with improvement. She denies daily SOB but does notice sounds of wheezing at night. She is compliant to her Xyzal daily. She does have intermittent reflux. Denies a childhood history of asthma.   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 Cold sore outbreaks occurring once  monthly since Covid and having to wear masks. Previously she'd have breakouts twice annually.   5) Chronic Neck/Back Pain: Chronic and currently following with sports medicine. 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 Now with left heel pain that she mostly feels when driving/sitting, describes as a "burning" sensation. She's not yet mentioned this to sports medicine.   Review of Systems  Constitutional: Negative for fever.  HENT: Positive for postnasal drip.   Respiratory: Positive for cough and wheezing. Negative for shortness of breath.   Cardiovascular: Negative for chest pain.  Musculoskeletal:       Burning sensation to left heel.  Skin: Negative for color change.  Psychiatric/Behavioral: Negative for suicidal ideas.       Past Medical History:  Diagnosis Date  . Anxiety   . Back pain    chronic  . Depression   . Disc disease, degenerative, lumbar or lumbosacral   . History of ovarian cyst   . Migraine   . Scoliosis      Social History   Socioeconomic History  . Marital status: Married    Spouse name: Not on file  . Number of children: 1  . Years of education: Not on file  . Highest education level: Not on file  Occupational History  . Occupation: CMA  Tobacco Use  . Smoking status: Former Smoker    Types: Cigarettes    Quit date: 08/18/2016    Years since quitting: 2.8  . Smokeless tobacco: Never Used  . Tobacco comment: 3 cigarettes a day  Substance and Sexual Activity  . Alcohol use: Yes    Alcohol/week: 0.0 standard drinks    Comment: occasional  . Drug use: No  . Sexual activity: Yes    Birth control/protection: Pill  Other Topics Concern  . Not on file  Social History Narrative  . Not on file   Social Determinants of Health   Financial Resource Strain:   . Difficulty of Paying Living Expenses: Not on file  Food Insecurity:   . Worried About Charity fundraiser in the Last  Year: Not on file  . Ran Out of Food in the Last Year: Not on file  Transportation Needs:   . Lack of Transportation (Medical): Not on file  . Lack of Transportation (Non-Medical): Not on file  Physical Activity:   . Days of Exercise per Week: Not on file  . Minutes of Exercise per Session: Not on file  Stress:   . Feeling of Stress : Not on file  Social Connections:   . Frequency of Communication with Friends and Family: Not on file  . Frequency of Social Gatherings with Friends and Family: Not on file  . Attends Religious Services: Not on file  . Active Member of Clubs or Organizations: Not on file  . Attends Archivist Meetings: Not on file  . Marital Status: Not on file  Intimate Partner Violence:   . Fear of Current or Ex-Partner: Not on file  . Emotionally Abused: Not on file  . Physically Abused: Not on file  . Sexually Abused: Not on file    Past Surgical History:  Procedure Laterality Date  . APPENDECTOMY    . BREAST SURGERY Right 2008   lump/ benign    Family History  Problem Relation Age of Onset  . Cancer Mother 1       breast  . Breast cancer Mother 110       and 57  . Alcohol abuse Father   . Vision loss Maternal Grandmother   . Heart disease Maternal Grandmother   . Heart failure Maternal Grandmother   . Cancer Maternal Grandfather   . Alcohol abuse Paternal Grandfather   . Arthritis Maternal Aunt     Allergies  Allergen Reactions  . Imitrex [Sumatriptan] Other (See Comments)    Vomiting, Slurred Speech and Facial Numbness    Current Outpatient Medications on File Prior to Visit  Medication Sig Dispense Refill  . aspirin-acetaminophen-caffeine (EXCEDRIN MIGRAINE) 250-250-65 MG tablet Take by mouth every 6 (six) hours as needed for headache.    . beclomethasone (QVAR REDIHALER) 40 MCG/ACT inhaler Inhale 2 puffs into the lungs 2 (two) times daily. 10.6 g 1  . escitalopram (LEXAPRO) 20 MG tablet TAKE 1 TABLET BY MOUTH DAILY. 90 tablet 1   . ibuprofen (ADVIL) 200 MG tablet Take 200 mg by mouth every 6 (six) hours as needed.    Marland Kitchen levocetirizine (XYZAL) 5 MG tablet Take 1 tablet (5 mg total) by mouth every evening. 90 tablet 3  . norethindrone-ethinyl estradiol 1/35 (ALAYCEN 1/35) tablet Take 1 tablet by mouth daily. 84 tablet 3  . valACYclovir (VALTREX) 1000 MG tablet      No current facility-administered medications on file prior to visit.    BP 100/64   Pulse 70   Temp 97.8 F (36.6 C) (Temporal)   Ht 5' 9.5" (1.765 m)   Wt 237 lb 8 oz (107.7 kg)   LMP 06/24/2019   SpO2 97%   BMI 34.57 kg/m  Objective:   Physical Exam  Constitutional: She appears well-nourished.  Cardiovascular: Normal rate and regular rhythm.  Respiratory: Effort normal. She has wheezes.  Mild to moderate expiratory wheezing noted in all fields.  Musculoskeletal:     Cervical back: Neck supple.  Skin: Skin is warm and dry.  Psychiatric: She has a normal mood and affect.           Assessment & Plan:

## 2019-06-30 NOTE — Assessment & Plan Note (Signed)
Due for screening mammogram in March 2021, orders placed.

## 2019-06-30 NOTE — Assessment & Plan Note (Signed)
Following with sports medicine. Recommended she mention heel burning/discomfort as I suspect this to be secondary to nerve involvement.

## 2019-06-30 NOTE — Assessment & Plan Note (Signed)
Doing well on lexapro 20 mg, continue same. Denies Si/HI.

## 2019-06-30 NOTE — Assessment & Plan Note (Signed)
More recurrent episodes since having to wear daily masks, continue OTC treatment.

## 2019-06-30 NOTE — Assessment & Plan Note (Signed)
This likely represents undiagnosed asthma especially given exam today and improvement with ICS use.  Discussed to start consistent daily use of Qvar. Offered pulmonology referral as we cannot complete spirometry in the office, she kindly declines and would like to start with daily Qvar use.  She will update.

## 2019-06-30 NOTE — Patient Instructions (Signed)
Stop by the lab prior to leaving today. I will notify you of your results once received.   Use your Qvar daily as disucssed.  It was a pleasure to see you today!

## 2019-07-06 ENCOUNTER — Ambulatory Visit (INDEPENDENT_AMBULATORY_CARE_PROVIDER_SITE_OTHER): Payer: 59 | Admitting: Urology

## 2019-07-06 ENCOUNTER — Encounter: Payer: Self-pay | Admitting: Urology

## 2019-07-06 ENCOUNTER — Other Ambulatory Visit: Payer: Self-pay

## 2019-07-06 VITALS — BP 125/86 | HR 84 | Ht 69.5 in | Wt 237.6 lb

## 2019-07-06 DIAGNOSIS — N3020 Other chronic cystitis without hematuria: Secondary | ICD-10-CM | POA: Diagnosis not present

## 2019-07-06 DIAGNOSIS — R300 Dysuria: Secondary | ICD-10-CM

## 2019-07-06 LAB — URINALYSIS, COMPLETE
Bilirubin, UA: NEGATIVE
Glucose, UA: NEGATIVE
Ketones, UA: NEGATIVE
Leukocytes,UA: NEGATIVE
Nitrite, UA: NEGATIVE
Protein,UA: NEGATIVE
RBC, UA: NEGATIVE
Specific Gravity, UA: >1.03 — ABNORMAL HIGH (ref 1.005–1.030)
Urobilinogen, Ur: 0.2 mg/dL (ref 0.2–1.0)
pH, UA: 5 (ref 5.0–7.5)

## 2019-07-06 LAB — MICROSCOPIC EXAMINATION: RBC, Urine: NONE SEEN /hpf (ref 0–2)

## 2019-07-06 NOTE — Progress Notes (Signed)
07/06/2019 9:25 AM   Autumn [name] 1986/03/30 [id number]  Referring provider: Lucille Passy, MD [place],  [place] [address]  Chief Complaint  Patient presents with  . Urinary Frequency  . Dysuria    HPI: The patient has 17-month history of dysuria off and on with suprapubic pressure like she needs to go.  Sometimes urine output is good other times its low.  It can be quite significant at times that she feels he needs to spread her legs and there seems to be burning at the level of the vulva as well.  She has been treated and assessed by gynecology.  I am not convinced she has been on antibiotics because urinalysis by history of been normal.  They treat her once for trichomonas.  The flare can be for half day every few days but it is quite variable.  Sometimes it feels like she is almost involuntarily clenching in the vaginal area.  No pain with intercourse  She voids every 1 or 2 hours and sometimes gets up once.  She can leak if she coughs hard with a full bladder.  I reviewed medical record and the last 3 urine cultures negative and pelvic ultrasound normal  No previous kidney stones or bladder surgery   PMH: Past Medical History:  Diagnosis Date  . Anxiety   . Back pain    chronic  . Depression   . Disc disease, degenerative, lumbar or lumbosacral   . History of ovarian cyst   . Migraine   . Scoliosis   . Shingles 06/20/2018    Surgical History: Past Surgical History:  Procedure Laterality Date  . APPENDECTOMY    . BREAST SURGERY Right 2008   lump/ benign    Home Medications:  Allergies as of 07/06/2019      Reactions   Imitrex [sumatriptan] Other (See Comments)   Vomiting, Slurred Speech and Facial Numbness      Medication List       Accurate as of July 06, 2019  9:25 AM. If you have any questions, ask your nurse or doctor.        aspirin-acetaminophen-caffeine [id number] MG tablet Commonly known as: EXCEDRIN  MIGRAINE Take by mouth every 6 (six) hours as needed for headache.   escitalopram 20 MG tablet Commonly known as: LEXAPRO TAKE 1 TABLET BY MOUTH DAILY.   ibuprofen 200 MG tablet Commonly known as: ADVIL Take 200 mg by mouth every 6 (six) hours as needed.   levocetirizine 5 MG tablet Commonly known as: Xyzal Take 1 tablet (5 mg total) by mouth every evening.   norethindrone-ethinyl estradiol 1/35 tablet Commonly known as: ALAYCEN 1/35 Take 1 tablet by mouth daily.   Qvar RediHaler 40 MCG/ACT inhaler Generic drug: beclomethasone Inhale 2 puffs into the lungs 2 (two) times daily.   valACYclovir 1000 MG tablet Commonly known as: VALTREX       Allergies:  Allergies  Allergen Reactions  . Imitrex [Sumatriptan] Other (See Comments)    Vomiting, Slurred Speech and Facial Numbness    Family History: Family History  Problem Relation Age of Onset  . Cancer Mother 32       breast  . Breast cancer Mother 39       and 4  . Alcohol abuse Father   . Vision loss Maternal Grandmother   . Heart disease Maternal Grandmother   . Heart failure Maternal Grandmother   . Cancer Maternal Grandfather   . Alcohol abuse Paternal  Grandfather   . Arthritis Maternal Aunt     Social History:  reports that she quit smoking about 2 years ago. Her smoking use included cigarettes. She has never used smokeless tobacco. She reports current alcohol use. She reports that she does not use drugs.  ROS: UROLOGY Frequent Urination?: Yes Hard to postpone urination?: No Burning/pain with urination?: Yes Get up at night to urinate?: No Leakage of urine?: No Urine stream starts and stops?: No Trouble starting stream?: No Do you have to strain to urinate?: No Blood in urine?: No Urinary tract infection?: No Sexually transmitted disease?: No Injury to kidneys or bladder?: No Painful intercourse?: No Weak stream?: No Currently pregnant?: No Vaginal bleeding?: No Last menstrual period?:  06/24/2019  Gastrointestinal Nausea?: No Vomiting?: No Indigestion/heartburn?: No Diarrhea?: No Constipation?: No  Constitutional Fever: No Night sweats?: No Weight loss?: No Fatigue?: No  Skin Skin rash/lesions?: No Itching?: No  Eyes Blurred vision?: No Double vision?: No  Ears/Nose/Throat Sore throat?: No Sinus problems?: No  Hematologic/Lymphatic Swollen glands?: No Easy bruising?: No  Cardiovascular Leg swelling?: No Chest pain?: No  Respiratory Cough?: No Shortness of breath?: No  Endocrine Excessive thirst?: No  Musculoskeletal Back pain?: No Joint pain?: No  Neurological Headaches?: No Dizziness?: No  Psychologic Depression?: No Anxiety?: No  Physical Exam: BP 125/86 (BP Location: Left Arm, Patient Position: Sitting, Cuff Size: Large)   Pulse 84   Ht 5' 9.5" (1.765 m)   Wt 107.8 kg   LMP 06/24/2019   BMI 34.58 kg/m   Constitutional:  Alert and oriented, No acute distress. HEENT: [place] AT, moist mucus membranes.  Trachea midline, no masses. Cardiovascular: No clubbing, cyanosis, or edema. Respiratory: Normal respiratory effort, no increased work of breathing. GI: Abdomen is soft, nontender, nondistended, no abdominal masses GU: No prolapse.  No tenderness.  No vaginitis.  No dryness Skin: No rashes, bruises or suspicious lesions. Lymph: No cervical or inguinal adenopathy. Neurologic: Grossly intact, no focal deficits, moving all 4 extremities. Psychiatric: Normal mood and affect.  Laboratory Data: Lab Results  Component Value Date   WBC 7.2 06/30/2019   HGB 15.0 06/30/2019   HCT 44.3 06/30/2019   MCV 90.7 06/30/2019   PLT 336.0 06/30/2019    Lab Results  Component Value Date   CREATININE 0.86 06/30/2019    No results found for: PSA  No results found for: TESTOSTERONE  Lab Results  Component Value Date   HGBA1C 5.8 06/30/2019    Urinalysis    Component Value Date/Time   COLORURINE YELLOW 02/21/2019 1504    APPEARANCEUR CLEAR 02/21/2019 1504   APPEARANCEUR Cloudy 12/30/2011 0051   LABSPEC 1.020 02/21/2019 1504   LABSPEC 1.030 12/30/2011 0051   PHURINE 5.5 02/21/2019 1504   GLUCOSEU NEGATIVE 02/21/2019 1504   GLUCOSEU Negative 12/30/2011 0051   HGBUR NEGATIVE 02/21/2019 1504   BILIRUBINUR Negative 05/18/2019 1304   BILIRUBINUR Negative 12/30/2011 0051   KETONESUR NEGATIVE 02/21/2019 1504   PROTEINUR Negative 05/18/2019 1304   PROTEINUR NEGATIVE 02/21/2019 1504   UROBILINOGEN 0.2 05/18/2019 1304   NITRITE Negative 05/18/2019 1304   NITRITE NEGATIVE 02/21/2019 1504   LEUKOCYTESUR Negative 05/18/2019 1304   LEUKOCYTESUR NEGATIVE 02/21/2019 1504   LEUKOCYTESUR 3+ 12/30/2011 0051    Pertinent Imaging: No recent renal x-ray.  Reviewed chart.  Reviewed urine and sent for culture  Assessment & Plan: Patient could have interstitial cystitis.  Role of hydrodistention discussed.  Patient was nontender but could benefit from pelvic floor therapy.  Usual template used  for hydrodistention.  Index of suspicion mild to moderate.  Some of her symptoms may be more external versus true urethritis but is difficult to say  1. Dysuria   - Urinalysis, Complete   No follow-ups on file.  Reece Packer, MD  [facility] [address], [place], [place] [address] [phone]

## 2019-07-08 LAB — CULTURE, URINE COMPREHENSIVE

## 2019-07-19 DIAGNOSIS — J029 Acute pharyngitis, unspecified: Secondary | ICD-10-CM | POA: Diagnosis not present

## 2019-07-19 DIAGNOSIS — Z20822 Contact with and (suspected) exposure to covid-19: Secondary | ICD-10-CM | POA: Diagnosis not present

## 2019-07-29 ENCOUNTER — Encounter: Payer: Self-pay | Admitting: Family Medicine

## 2019-07-29 ENCOUNTER — Ambulatory Visit: Payer: 59 | Admitting: Family Medicine

## 2019-07-29 ENCOUNTER — Other Ambulatory Visit: Payer: Self-pay

## 2019-07-29 VITALS — BP 100/80 | HR 81 | Ht 69.5 in | Wt 240.0 lb

## 2019-07-29 DIAGNOSIS — M549 Dorsalgia, unspecified: Secondary | ICD-10-CM | POA: Diagnosis not present

## 2019-07-29 DIAGNOSIS — G8929 Other chronic pain: Secondary | ICD-10-CM

## 2019-07-29 DIAGNOSIS — M999 Biomechanical lesion, unspecified: Secondary | ICD-10-CM

## 2019-07-29 DIAGNOSIS — M542 Cervicalgia: Secondary | ICD-10-CM

## 2019-07-29 NOTE — Assessment & Plan Note (Signed)
Decision today to treat with OMT was based on Physical Exam  After verbal consent patient was treated with HVLA, ME, FPR techniques in cervical, thoracic, rib,  areas  Patient tolerated the procedure well with improvement in symptoms  Patient given exercises, stretches and lifestyle modifications  See medications in patient instructions if given  Patient will follow up in 4-8 weeks 

## 2019-07-29 NOTE — Patient Instructions (Addendum)
Good to see you [employer] Tennis ball back left pocket with driving See me again in 8-10 weeks

## 2019-07-29 NOTE — Assessment & Plan Note (Signed)
Chronic neck and back issues.  Seems to be more secondary to patient's positioning at work.  Social determinants of health including financial which does not allow patient to go to physical therapy on a regular basis.  Discussed icing regimen and home exercise, patient is to increase activity slowly.  Follow-up with me again in 4 to 8 weeks

## 2019-07-29 NOTE — Progress Notes (Signed)
[place] Phone: [phone] Subjective:    I'm seeing this patient by the request  of:  Pleas Koch, NP  CC: Neck pain follow-up  [id number]  Autumn [name] is a 34 y.o. female coming in with complaint of neck pain. Seen 04/27/2019 for OMT. Left foot pain today. Heel pain burning sensation for 3 months with driving on in passenger seat. Blood work taken. Sometimes she has a shooting pain in the first toe with walking. Has purchased new shoes. 7 years ago a glass jar fell on her toe and has had a loss of sensation in that toe since then.   Onset- Chronic  Location - left heel  Character- burning sensation Aggravating factors- Reliving factors-  Therapies tried-      Past Medical History:  Diagnosis Date  . Anxiety   . Back pain    chronic  . Depression   . Disc disease, degenerative, lumbar or lumbosacral   . History of ovarian cyst   . Migraine   . Scoliosis   . Shingles 06/20/2018   Past Surgical History:  Procedure Laterality Date  . APPENDECTOMY    . BREAST SURGERY Right 2008   lump/ benign   Social History   Socioeconomic History  . Marital status: Married    Spouse name: Not on file  . Number of children: 1  . Years of education: Not on file  . Highest education level: Not on file  Occupational History  . Occupation: CMA  Tobacco Use  . Smoking status: Former Smoker    Types: Cigarettes    Quit date: 08/18/2016    Years since quitting: 2.9  . Smokeless tobacco: Never Used  . Tobacco comment: 3 cigarettes a day  Substance and Sexual Activity  . Alcohol use: Yes    Alcohol/week: 0.0 standard drinks    Comment: occasional  . Drug use: No  . Sexual activity: Yes    Birth control/protection: Pill  Other Topics Concern  . Not on file  Social History Narrative  . Not on file   Social Determinants of Health   Financial Resource Strain:   . Difficulty of Paying  Living Expenses: Not on file  Food Insecurity:   . Worried About Charity fundraiser in the Last Year: Not on file  . Ran Out of Food in the Last Year: Not on file  Transportation Needs:   . Lack of Transportation (Medical): Not on file  . Lack of Transportation (Non-Medical): Not on file  Physical Activity:   . Days of Exercise per Week: Not on file  . Minutes of Exercise per Session: Not on file  Stress:   . Feeling of Stress : Not on file  Social Connections:   . Frequency of Communication with Friends and Family: Not on file  . Frequency of Social Gatherings with Friends and Family: Not on file  . Attends Religious Services: Not on file  . Active Member of Clubs or Organizations: Not on file  . Attends Archivist Meetings: Not on file  . Marital Status: Not on file   Allergies  Allergen Reactions  . Imitrex [Sumatriptan] Other (See Comments)    Vomiting, Slurred Speech and Facial Numbness   Family History  Problem Relation Age of Onset  . Cancer Mother 74       breast  . Breast cancer Mother 14       and 36  .  Alcohol abuse Father   . Vision loss Maternal Grandmother   . Heart disease Maternal Grandmother   . Heart failure Maternal Grandmother   . Cancer Maternal Grandfather   . Alcohol abuse Paternal Grandfather   . Arthritis Maternal Aunt     Current Outpatient Medications (Endocrine & Metabolic):  .  norethindrone-ethinyl estradiol 1/35 (ALAYCEN 1/35) tablet, Take 1 tablet by mouth daily.   Current Outpatient Medications (Respiratory):  .  beclomethasone (QVAR REDIHALER) 40 MCG/ACT inhaler, Inhale 2 puffs into the lungs 2 (two) times daily. Marland Kitchen  levocetirizine (XYZAL) 5 MG tablet, Take 1 tablet (5 mg total) by mouth every evening.  Current Outpatient Medications (Analgesics):  .  aspirin-acetaminophen-caffeine (EXCEDRIN MIGRAINE) 250-250-65 MG tablet, Take by mouth every 6 (six) hours as needed for headache. .  ibuprofen (ADVIL) 200 MG tablet, Take 200  mg by mouth every 6 (six) hours as needed.   Current Outpatient Medications (Other):  .  escitalopram (LEXAPRO) 20 MG tablet, TAKE 1 TABLET BY MOUTH DAILY. .  valACYclovir (VALTREX) 1000 MG tablet,    Reviewed prior external information including notes and imaging from  primary care provider As well as notes that were available from care everywhere and other healthcare systems.  Past medical history, social, surgical and family history all reviewed in electronic medical record.  No pertanent information unless stated regarding to the chief complaint.   Review of Systems:  No headache, visual changes, nausea, vomiting, diarrhea, constipation, dizziness, abdominal pain, skin rash, fevers, chills, night sweats, weight loss, swollen lymph nodes, body aches, joint swelling, chest pain, shortness of breath, mood changes. POSITIVE muscle aches  Objective  There were no vitals taken for this visit.   General: No apparent distress alert and oriented x3 mood and affect normal, dressed appropriately.  HEENT: Pupils equal, extraocular movements intact  Respiratory: Patient's speak in full sentences and does not appear short of breath  Cardiovascular: No lower extremity edema, non tender, no erythema  Skin: Warm dry intact with no signs of infection or rash on extremities or on axial skeleton.  Abdomen: Soft nontender  Neuro: Cranial nerves II through XII are intact, neurovascularly intact in all extremities with 2+ DTRs and 2+ pulses.  Lymph: No lymphadenopathy of posterior or anterior cervical chain or axillae bilaterally.  Gait normal with good balance and coordination.  MSK:  Non tender with full range of motion and good stability and symmetric strength and tone of shoulders, elbows, wrist, hip, knee and ankles bilaterally.   Left heel exam shows the patient does have a longitudinal breakdown noted with mild overpronation of the hindfoot.  Patient is tender to palpation minorly over the plantar  aspect.  Large toe has some mild hallux limitus. Neck: Inspection mild loss of lordosis. No palpable stepoffs. Negative Spurling's maneuver. Full neck range of motion Grip strength and sensation normal in bilateral hands Strength good C4 to T1 distribution No sensory change to C4 to T1 Negative Hoffman sign bilaterally Reflexes normal Tightness in the parascapular region bilaterally  Osteopathic findings  C2 flexed rotated and side bent right C4 flexed rotated and side bent left C6 flexed rotated and side bent left T3 extended rotated and side bent right inhaled third rib    Impression and Recommendations:     This case required medical decision making of moderate complexity. The above documentation has been reviewed and is accurate and complete Lyndal Pulley, DO       Note: This dictation was prepared with Dragon dictation  along with smaller phrase technology. Any transcriptional errors that result from this process are unintentional.

## 2019-08-12 ENCOUNTER — Telehealth: Payer: Self-pay | Admitting: Urology

## 2019-08-12 NOTE — Telephone Encounter (Signed)
Called pt. To schedule surgery and preop. Pt stated she has decided to cancel surgery at this time and she will call back if she would like to proceed in the future.I told the patient to call the office PRN.

## 2019-08-27 ENCOUNTER — Ambulatory Visit
Admission: RE | Admit: 2019-08-27 | Discharge: 2019-08-27 | Disposition: A | Discharge status: Home / Self Care | Payer: 59 | Source: Ambulatory Visit | Attending: Primary Care | Admitting: Primary Care

## 2019-08-27 ENCOUNTER — Other Ambulatory Visit: Payer: Self-pay

## 2019-08-27 DIAGNOSIS — Z803 Family history of malignant neoplasm of breast: Secondary | ICD-10-CM

## 2019-08-27 DIAGNOSIS — Z1231 Encounter for screening mammogram for malignant neoplasm of breast: Secondary | ICD-10-CM | POA: Diagnosis not present

## 2019-09-30 ENCOUNTER — Other Ambulatory Visit: Payer: Self-pay

## 2019-09-30 ENCOUNTER — Encounter: Payer: Self-pay | Admitting: Family Medicine

## 2019-09-30 ENCOUNTER — Ambulatory Visit (INDEPENDENT_AMBULATORY_CARE_PROVIDER_SITE_OTHER): Payer: 59 | Admitting: Family Medicine

## 2019-09-30 VITALS — BP 108/72 | HR 90 | Temp 97.5°F | Ht 69.5 in | Wt 241.5 lb

## 2019-09-30 DIAGNOSIS — M7662 Achilles tendinitis, left leg: Secondary | ICD-10-CM | POA: Diagnosis not present

## 2019-09-30 NOTE — Progress Notes (Signed)
Autumn Mance T. Alexsandra Shontz, MD, [place]  [facility] and [place] at [facility] [place], [address]  Phone: [phone]  FAX: [phone]  Autumn [name] - 34 y.o. female  MRN [id number]  Date of Birth: 12-14-1985  Date: 09/30/2019  PCP: Pleas Koch, NP  Referral: Pleas Koch, NP  Chief Complaint  Patient presents with  . Foot Pain    Left    This visit occurred during the SARS-CoV-2 public health emergency.  Safety protocols were in place, including screening questions prior to the visit, additional usage of staff PPE, and extensive cleaning of exam room while observing appropriate contact time as indicated for disinfecting solutions.   Subjective:   Pleasant patient who presents with a 3-week history of posterior heel pain.  No occult, abrupt onset. Has been more insidious in character. There is a dull ache present and worse with activity:  Prior home rehab: None Prior meds: OTC Tylenol and NSAIDs Orthosis / Braces: none  Plantar fasc pain and some pain at the acilles.  Started some beach body on demand.  Hurts a lot when she gets up and will limp.  With getting up, it will bother her a lot.   At this point this is all predominantly in the posterior heel.  Review of Systems is noted in the HPI, as appropriate  Objective:   Blood pressure 108/72, pulse 90, temperature (!) 97.5 F (36.4 C), temperature source Temporal, height 5' 9.5" (1.765 m), weight 241 lb 8 oz (109.5 kg), last menstrual period 09/16/2019, SpO2 96 %.  GEN: No acute distress; alert,appropriate. PULM: Breathing comfortably in no respiratory distress PSYCH: Normally interactive.   Foot: L Echymosis: no Edema: no ROM: full LE B Gait: heel toe, non-antalgic MT pain: no Callus pattern: none Lateral Mall: NT Medial Mall: NT Talus: NT Navicular: NT Cuboid: NT Calcaneous: NT Metatarsals: NT 5th MT:  NT Phalanges: NT Achilles: PAINFUL TO PALPATE AT INSERTION ON LEFT, SMALL NODULE Plantar Fascia: NT Fat Pad: NT Peroneals: NT Post Tib: NT Great Toe: Nml motion Ant Drawer: neg ATFL: NT CFL: NT Deltoid: NT Sensation: intact  Radiology: No results found.  Assessment and Plan:     ICD-10-CM   1. Achilles tendinitis, left leg  M76.62     Level of Medical Decision-Making in this case is MODERATE.  Pathophysiology of achilles tendinopathy reviewed.  Additionally, I have given the patient the program emphasizing eccentric overloading detailed in the instructions based on Dr. Trudi Ida work and protocols.  Supportive footwear reviewed.   I am good to hold off on nitroglycerin in this patient who does get some migraines.  I appreciate the opportunity to evaluate this very friendly patient. If you have any question regarding her care or prognosis, do not hesitate to ask.  Patient Instructions  Achilles Rehab  Start while seated, then progress to a step Calf raises on a step First lower and work on the slow lowering process When both feet are easy, then go to doing in on a step  Begin with 3 sets of 10 repetitions Increase by 5 repetitions every 3 days  Goal is 3 sets of 30 repetitions  Do with both knee straight and knee at 20 degrees of flexion  If pain persists at 3 sets of 30 - add backpack with 5 lbs Increase by 5 lbs per week to max of 30 lbs   Get a rubberized heel cup I like  the [employer] brand  Alleve 2 tabs by mouth two times a day over the counter: Take at least for 2 - 3 weeks. This is equal to a prescripton strength dose (GENERIC CHEAPER EQUIVALENT IS NAPROXEN SODIUM)    Follow-up: No follow-ups on file.  No orders of the defined types were placed in this encounter.  Medications Discontinued During This Encounter  Medication Reason  . beclomethasone (QVAR REDIHALER) 40 MCG/ACT inhaler Completed Course   No orders of the defined types were placed  in this encounter.   Signed,  Maud Deed. Ashwika Freels, MD   Outpatient Encounter Medications as of 09/30/2019  Medication Sig  . aspirin-acetaminophen-caffeine (EXCEDRIN MIGRAINE) 250-250-65 MG tablet Take by mouth every 6 (six) hours as needed for headache.  . escitalopram (LEXAPRO) 20 MG tablet TAKE 1 TABLET BY MOUTH DAILY.  Marland Kitchen ibuprofen (ADVIL) 200 MG tablet Take 200 mg by mouth every 6 (six) hours as needed.  Marland Kitchen levocetirizine (XYZAL) 5 MG tablet Take 1 tablet (5 mg total) by mouth every evening.  . norethindrone-ethinyl estradiol 1/35 (ALAYCEN 1/35) tablet Take 1 tablet by mouth daily.  . valACYclovir (VALTREX) 1000 MG tablet   . [DISCONTINUED] beclomethasone (QVAR REDIHALER) 40 MCG/ACT inhaler Inhale 2 puffs into the lungs 2 (two) times daily.   No facility-administered encounter medications on file as of 09/30/2019.

## 2019-09-30 NOTE — Patient Instructions (Addendum)
Achilles Rehab  Start while seated, then progress to a step Calf raises on a step First lower and work on the slow lowering process When both feet are easy, then go to doing in on a step  Begin with 3 sets of 10 repetitions Increase by 5 repetitions every 3 days  Goal is 3 sets of 30 repetitions  Do with both knee straight and knee at 20 degrees of flexion  If pain persists at 3 sets of 30 - add backpack with 5 lbs Increase by 5 lbs per week to max of 30 lbs   Get a rubberized heel cup I like the [employer] brand  Alleve 2 tabs by mouth two times a day over the counter: Take at least for 2 - 3 weeks. This is equal to a prescripton strength dose (GENERIC CHEAPER EQUIVALENT IS NAPROXEN SODIUM)

## 2019-10-09 ENCOUNTER — Ambulatory Visit: Payer: 59 | Admitting: Family Medicine

## 2019-10-09 NOTE — Assessment & Plan Note (Deleted)
   Decision today to treat with OMT was based on Physical Exam  After verbal consent patient was treated with HVLA, ME, FPR techniques in cervical, thoracic, rib,areas, all areas are chronic   Patient tolerated the procedure well with improvement in symptoms  Patient given exercises, stretches and lifestyle modifications  See medications in patient instructions if given  Patient will follow up in 4-8 weeks 

## 2019-10-09 NOTE — Progress Notes (Deleted)
[place] Phone: [phone] Subjective:    I'm seeing this patient by the request  of:  Pleas Koch, NP  CC: Chronic neck and back  [id number]  Autumn [name] is a 34 y.o. female coming in with complaint of neck and back pain. Last seen on 07/29/2019 for OMT. Patient states     Past Medical History:  Diagnosis Date  . Anxiety   . Back pain    chronic  . Depression   . Disc disease, degenerative, lumbar or lumbosacral   . History of ovarian cyst   . Migraine   . Scoliosis   . Shingles 06/20/2018   Past Surgical History:  Procedure Laterality Date  . APPENDECTOMY    . BREAST SURGERY Right 2008   lump/ benign   Social History   Socioeconomic History  . Marital status: Married    Spouse name: Not on file  . Number of children: 1  . Years of education: Not on file  . Highest education level: Not on file  Occupational History  . Occupation: CMA  Tobacco Use  . Smoking status: Former Smoker    Types: Cigarettes    Quit date: 08/18/2016    Years since quitting: 3.1  . Smokeless tobacco: Never Used  . Tobacco comment: 3 cigarettes a day  Substance and Sexual Activity  . Alcohol use: Yes    Alcohol/week: 0.0 standard drinks    Comment: occasional  . Drug use: No  . Sexual activity: Yes    Birth control/protection: Pill  Other Topics Concern  . Not on file  Social History Narrative  . Not on file   Social Determinants of Health   Financial Resource Strain:   . Difficulty of Paying Living Expenses:   Food Insecurity:   . Worried About Charity fundraiser in the Last Year:   . Arboriculturist in the Last Year:   Transportation Needs:   . Film/video editor (Medical):   Marland Kitchen Lack of Transportation (Non-Medical):   Physical Activity:   . Days of Exercise per Week:   . Minutes of Exercise per Session:   Stress:   . Feeling of Stress :   Social Connections:   . Frequency  of Communication with Friends and Family:   . Frequency of Social Gatherings with Friends and Family:   . Attends Religious Services:   . Active Member of Clubs or Organizations:   . Attends Archivist Meetings:   Marland Kitchen Marital Status:    Allergies  Allergen Reactions  . Imitrex [Sumatriptan] Other (See Comments)    Vomiting, Slurred Speech and Facial Numbness   Family History  Problem Relation Age of Onset  . Cancer Mother 54       breast  . Breast cancer Mother 39       and 46  . Alcohol abuse Father   . Vision loss Maternal Grandmother   . Heart disease Maternal Grandmother   . Heart failure Maternal Grandmother   . Cancer Maternal Grandfather   . Alcohol abuse Paternal Grandfather   . Arthritis Maternal Aunt     Current Outpatient Medications (Endocrine & Metabolic):  .  norethindrone-ethinyl estradiol 1/35 (ALAYCEN 1/35) tablet, Take 1 tablet by mouth daily.   Current Outpatient Medications (Respiratory):  .  levocetirizine (XYZAL) 5 MG tablet, Take 1 tablet (5 mg total) by mouth every evening.  Current Outpatient Medications (Analgesics):  .  aspirin-acetaminophen-caffeine (EXCEDRIN MIGRAINE) 250-250-65 MG tablet, Take by mouth every 6 (six) hours as needed for headache. .  ibuprofen (ADVIL) 200 MG tablet, Take 200 mg by mouth every 6 (six) hours as needed.   Current Outpatient Medications (Other):  .  escitalopram (LEXAPRO) 20 MG tablet, TAKE 1 TABLET BY MOUTH DAILY. .  valACYclovir (VALTREX) 1000 MG tablet,    Reviewed prior external information including notes and imaging from  primary care provider As well as notes that were available from care everywhere and other healthcare systems.  Past medical history, social, surgical and family history all reviewed in electronic medical record.  No pertanent information unless stated regarding to the chief complaint.   Review of Systems:  No headache, visual changes, nausea, vomiting, diarrhea, constipation,  dizziness, abdominal pain, skin rash, fevers, chills, night sweats, weight loss, swollen lymph nodes, body aches, joint swelling, chest pain, shortness of breath, mood changes. POSITIVE muscle aches  Objective  Last menstrual period 09/16/2019.   General: No apparent distress alert and oriented x3 mood and affect normal, dressed appropriately.  HEENT: Pupils equal, extraocular movements intact  Respiratory: Patient's speak in full sentences and does not appear short of breath  Cardiovascular: No lower extremity edema, non tender, no erythema  Neuro: Cranial nerves II through XII are intact, neurovascularly intact in all extremities with 2+ DTRs and 2+ pulses.  Gait normal with good balance and coordination.  MSK:  Non tender with full range of motion and good stability and symmetric strength and tone of shoulders, elbows, wrist, hip, knee and ankles bilaterally.   Neck exam shows the patient does have some loss of lordosis.  Some mild increase in kyphosis of the upper thoracic spine.  Low back exam shows significant tightness in the thoracolumbar juncture in 1 mild over the sacroiliac joint.  Osteopathic findings  C6 flexed rotated and side bent left T3 extended rotated and side bent right inhaled third rib T9 extended rotated and side bent left   Impression and Recommendations:     This case required medical decision making of moderate complexity. The above documentation has been reviewed and is accurate and complete Lyndal Pulley, DO       Note: This dictation was prepared with Dragon dictation along with smaller phrase technology. Any transcriptional errors that result from this process are unintentional.

## 2019-10-09 NOTE — Assessment & Plan Note (Deleted)
Chronic issue with mild exacerbation.  Discussed with patient medications including ibuprofen at this time.  Consider the possibility of changing Lexapro to Effexor.  Patient will increase activity slowly.  We will continue to work on posture.  Follow-up again in 4 to 12 weeks

## 2019-10-23 ENCOUNTER — Ambulatory Visit: Payer: 59 | Admitting: Primary Care

## 2019-10-23 ENCOUNTER — Other Ambulatory Visit: Payer: Self-pay

## 2019-10-23 ENCOUNTER — Encounter: Payer: Self-pay | Admitting: Primary Care

## 2019-10-23 VITALS — BP 122/82 | HR 82 | Temp 96.2°F | Ht 69.5 in | Wt 242.2 lb

## 2019-10-23 DIAGNOSIS — R1012 Left upper quadrant pain: Secondary | ICD-10-CM

## 2019-10-23 HISTORY — DX: Left upper quadrant pain: R10.12

## 2019-10-23 LAB — COMPREHENSIVE METABOLIC PANEL
ALT: 31 U/L (ref 0–35)
AST: 23 U/L (ref 0–37)
Albumin: 4.2 g/dL (ref 3.5–5.2)
Alkaline Phosphatase: 62 U/L (ref 39–117)
BUN: 14 mg/dL (ref 6–23)
CO2: 28 mEq/L (ref 19–32)
Calcium: 9.1 mg/dL (ref 8.4–10.5)
Chloride: 101 mEq/L (ref 96–112)
Creatinine, Ser: 0.74 mg/dL (ref 0.40–1.20)
GFR: 90.04 mL/min (ref >60.00)
Glucose, Bld: 85 mg/dL (ref 70–99)
Potassium: 4.2 mEq/L (ref 3.5–5.1)
Sodium: 136 mEq/L (ref 135–145)
Total Bilirubin: 0.3 mg/dL (ref 0.2–1.2)
Total Protein: 7.3 g/dL (ref 6.0–8.3)

## 2019-10-23 LAB — CBC WITH DIFFERENTIAL/PLATELET
Basophils Absolute: 0.1 10*3/uL (ref 0.0–0.1)
Basophils Relative: 1.1 % (ref 0.0–3.0)
Eosinophils Absolute: 0.2 10*3/uL (ref 0.0–0.7)
Eosinophils Relative: 3.2 % (ref 0.0–5.0)
HCT: 40.4 % (ref 36.0–46.0)
Hemoglobin: 13.9 g/dL (ref 12.0–15.0)
Lymphocytes Relative: 46.7 % — ABNORMAL HIGH (ref 12.0–46.0)
Lymphs Abs: 3.6 10*3/uL (ref 0.7–4.0)
MCHC: 34.4 g/dL (ref 30.0–36.0)
MCV: 90.1 fl (ref 78.0–100.0)
Monocytes Absolute: 0.5 10*3/uL (ref 0.1–1.0)
Monocytes Relative: 7.1 % (ref 3.0–12.0)
Neutro Abs: 3.2 10*3/uL (ref 1.4–7.7)
Neutrophils Relative %: 41.9 % — ABNORMAL LOW (ref 43.0–77.0)
Platelets: 301 10*3/uL (ref 150.0–400.0)
RBC: 4.49 Mil/uL (ref 3.87–5.11)
RDW: 12.6 % (ref 11.5–15.5)
WBC: 7.6 10*3/uL (ref 4.0–10.5)

## 2019-10-23 LAB — LIPASE: Lipase: 42 U/L (ref 11.0–59.0)

## 2019-10-23 NOTE — Patient Instructions (Signed)
Stop by the lab prior to leaving today. I will notify you of your results once received.   Avoid fried/fatty/spicy food.   It was a pleasure to see you today!

## 2019-10-23 NOTE — Progress Notes (Signed)
Subjective:    Patient ID: Autumn [name], female    DOB: [birth year], 34 y.o.   MRN: [id number]  HPI  This visit occurred during the SARS-CoV-2 public health emergency.  Safety protocols were in place, including screening questions prior to the visit, additional usage of staff PPE, and extensive cleaning of exam room while observing appropriate contact time as indicated for disinfecting solutions.   Autumn [name] is a 34 year old female with a history of IBS who presents today with a chief complaint of abdominal pain.  Her pain is located to the left upper and left lower abdomen which began about three months ago. Her symptoms are intermittent for which occurs within 30 minutes after eating greasy/fried food. She describes her pain as a "nagging/achying" and sometimes "stabbing" sensation. Also with bilateral upper abdominal bloating over this past week, will have to release her bra due to discomfort. Also with nausea that occurs with her abdominal pain.  She does not notice the abdominal pain with more bland food.  Today she had a bacon/egg/cheese biscuit, french fries, and coffee, and about 30 minutes later she noticed pain to her left upper abdomen. She also she felt fullness to the central chest with nausea.   She has had no changes to her bowel movements, she denies bloody stools, changes in urine symptoms, vomiting. She takes NSAID's sparingly for headaches.  She has not taken anything OTC for symptoms.  She does not know if she has a family or personal history of diverticulosis.  Review of Systems  Constitutional: Negative for fever.  Gastrointestinal: Positive for abdominal pain. Negative for blood in stool, constipation and diarrhea.  Genitourinary: Negative for dysuria.       Past Medical History:  Diagnosis Date  . Anxiety   . Back pain    chronic  . Depression   . Disc disease, degenerative, lumbar or lumbosacral   . History of ovarian cyst   . Migraine   . Scoliosis    . Shingles 06/20/2018     Social History   Socioeconomic History  . Marital status: Married    Spouse name: Not on file  . Number of children: 1  . Years of education: Not on file  . Highest education level: Not on file  Occupational History  . Occupation: CMA  Tobacco Use  . Smoking status: Former Smoker    Types: Cigarettes    Quit date: 08/18/2016    Years since quitting: 3.1  . Smokeless tobacco: Never Used  . Tobacco comment: 3 cigarettes a day  Substance and Sexual Activity  . Alcohol use: Yes    Alcohol/week: 0.0 standard drinks    Comment: occasional  . Drug use: No  . Sexual activity: Yes    Birth control/protection: Pill  Other Topics Concern  . Not on file  Social History Narrative  . Not on file   Social Determinants of Health   Financial Resource Strain:   . Difficulty of Paying Living Expenses:   Food Insecurity:   . Worried About Charity fundraiser in the Last Year:   . Arboriculturist in the Last Year:   Transportation Needs:   . Film/video editor (Medical):   Marland Kitchen Lack of Transportation (Non-Medical):   Physical Activity:   . Days of Exercise per Week:   . Minutes of Exercise per Session:   Stress:   . Feeling of Stress :   Social Connections:   . Frequency of  Communication with Friends and Family:   . Frequency of Social Gatherings with Friends and Family:   . Attends Religious Services:   . Active Member of Clubs or Organizations:   . Attends Archivist Meetings:   Marland Kitchen Marital Status:   Intimate Partner Violence:   . Fear of Current or Ex-Partner:   . Emotionally Abused:   Marland Kitchen Physically Abused:   . Sexually Abused:     Past Surgical History:  Procedure Laterality Date  . APPENDECTOMY    . BREAST SURGERY Right 2008   lump/ benign    Family History  Problem Relation Age of Onset  . Cancer Mother 71       breast  . Breast cancer Mother 7       and 62  . Alcohol abuse Father   . Vision loss Maternal Grandmother   .  Heart disease Maternal Grandmother   . Heart failure Maternal Grandmother   . Cancer Maternal Grandfather   . Alcohol abuse Paternal Grandfather   . Arthritis Maternal Aunt     Allergies  Allergen Reactions  . Imitrex [Sumatriptan] Other (See Comments)    Vomiting, Slurred Speech and Facial Numbness    Current Outpatient Medications on File Prior to Visit  Medication Sig Dispense Refill  . aspirin-acetaminophen-caffeine (EXCEDRIN MIGRAINE) 250-250-65 MG tablet Take by mouth every 6 (six) hours as needed for headache.    . escitalopram (LEXAPRO) 20 MG tablet TAKE 1 TABLET BY MOUTH DAILY. 90 tablet 1  . ibuprofen (ADVIL) 200 MG tablet Take 200 mg by mouth every 6 (six) hours as needed.    Marland Kitchen levocetirizine (XYZAL) 5 MG tablet Take 1 tablet (5 mg total) by mouth every evening. 90 tablet 3  . norethindrone-ethinyl estradiol 1/35 (ALAYCEN 1/35) tablet Take 1 tablet by mouth daily. 84 tablet 3  . valACYclovir (VALTREX) 1000 MG tablet      No current facility-administered medications on file prior to visit.    BP 122/82   Pulse 82   Temp (!) 96.2 F (35.7 C) (Temporal)   Ht 5' 9.5" (1.765 m)   Wt 242 lb 4 oz (109.9 kg)   LMP 10/12/2019   SpO2 98%   BMI 35.26 kg/m  ' Objective:   Physical Exam  Constitutional: She appears well-nourished.  Respiratory: Effort normal.  GI: Soft. Bowel sounds are normal.    No tenderness but "pressure" felt to the site during palpation to LLQ.           Assessment & Plan:

## 2019-10-23 NOTE — Assessment & Plan Note (Signed)
Also with pain to the left lower abdomen.  Chronic for the last 3 months, noted within 30 minutes after eating a greasy/fatty meal.  Exam today without tenderness to the right upper quadrant.  Mild pressure to the left lower quadrant.  Differentials include peptic ulcer disease, H. pylori infection, gallbladder etiology.  Suspect more gastric involvement given HPI.  Checking labs today including CMP, CBC with differential, lipase, H. pylori stool specimen.  Discussed to eat a bland diet, avoiding greasy/fried/spicy foods.  She is stable for outpatient treatment.  Await results.

## 2019-10-26 DIAGNOSIS — R1012 Left upper quadrant pain: Secondary | ICD-10-CM

## 2019-10-26 DIAGNOSIS — R1010 Upper abdominal pain, unspecified: Secondary | ICD-10-CM

## 2019-10-26 LAB — HELICOBACTER PYLORI  SPECIAL ANTIGEN
MICRO NUMBER:: 10479306
SPECIMEN QUALITY: ADEQUATE

## 2019-10-27 ENCOUNTER — Telehealth: Payer: Self-pay | Admitting: Primary Care

## 2019-10-27 ENCOUNTER — Other Ambulatory Visit: Payer: Self-pay | Admitting: Primary Care

## 2019-10-27 DIAGNOSIS — R1010 Upper abdominal pain, unspecified: Secondary | ICD-10-CM

## 2019-10-27 MED ORDER — OMEPRAZOLE 20 MG PO CPDR: 20.0000 mg | DELAYED_RELEASE_CAPSULE | Freq: Every day | ORAL | 0 refills | Status: DC

## 2019-10-27 NOTE — Telephone Encounter (Signed)
Please place New RUQ [place] order and please choose [place] as the location.

## 2019-10-27 NOTE — Telephone Encounter (Signed)
Orders placed.

## 2019-10-28 ENCOUNTER — Ambulatory Visit: Payer: 59

## 2019-10-28 ENCOUNTER — Other Ambulatory Visit: Payer: Self-pay

## 2019-10-28 ENCOUNTER — Ambulatory Visit (INDEPENDENT_AMBULATORY_CARE_PROVIDER_SITE_OTHER): Payer: 59

## 2019-10-28 DIAGNOSIS — R1010 Upper abdominal pain, unspecified: Secondary | ICD-10-CM | POA: Diagnosis not present

## 2019-10-28 DIAGNOSIS — K8020 Calculus of gallbladder without cholecystitis without obstruction: Secondary | ICD-10-CM | POA: Diagnosis not present

## 2019-10-28 DIAGNOSIS — K760 Fatty (change of) liver, not elsewhere classified: Secondary | ICD-10-CM | POA: Diagnosis not present

## 2019-11-11 ENCOUNTER — Other Ambulatory Visit: Payer: Self-pay | Admitting: Primary Care

## 2019-11-11 DIAGNOSIS — Z793 Long term (current) use of hormonal contraceptives: Secondary | ICD-10-CM

## 2019-11-11 NOTE — Telephone Encounter (Signed)
Last prescribed on 11/12/2018 by Dr Deborra Medina . Last OV on 10/23/2019. Future Lab appt scheduled on 01/04/2020

## 2019-11-11 NOTE — Telephone Encounter (Signed)
Refills sent to pharmacy. Pap smear UTD, completed in 2020.

## 2019-12-09 ENCOUNTER — Encounter: Payer: Self-pay | Admitting: Primary Care

## 2019-12-09 ENCOUNTER — Telehealth (INDEPENDENT_AMBULATORY_CARE_PROVIDER_SITE_OTHER): Payer: 59 | Admitting: Primary Care

## 2019-12-09 DIAGNOSIS — F419 Anxiety disorder, unspecified: Secondary | ICD-10-CM

## 2019-12-09 DIAGNOSIS — F32A Depression, unspecified: Secondary | ICD-10-CM

## 2019-12-09 DIAGNOSIS — F329 Major depressive disorder, single episode, unspecified: Secondary | ICD-10-CM

## 2019-12-09 MED ORDER — BUPROPION HCL 75 MG PO TABS: 75.0000 mg | ORAL_TABLET | Freq: Two times a day (BID) | ORAL | 1 refills | Status: DC

## 2019-12-09 NOTE — Progress Notes (Signed)
Subjective:    Patient ID: Autumn [name], female    DOB: 08-04-1985, 34 y.o.   MRN: [id number]  HPI  Virtual Visit via Video Note  I connected with Autumn [name] on 12/09/19 at 11:40 AM EDT by a video enabled telemedicine application and verified that I am speaking with the correct person using two identifiers.  Location: Patient: Work Provider: Office  Both patient and I are participating in visit.    I discussed the limitations of evaluation and management by telemedicine and the availability of in person appointments. The patient expressed understanding and agreed to proceed.  History of Present Illness:  Autumn [name] is a 34 year old female with a history of anxiety and depression who presents today to discuss depression.  She is currently managed on Lexapro 20 mg for which she's been taking for the last 7-8 years. She's tried Buspar,Celexa, Zoloft, and Effexor in the past but has felt better on Lexapro. She was previously seeing Dr. Nicolasa Ducking given her long history of depression.   Over the last four years she's felt "numb" on Lexapro, has a hard time feeling "genuinely happy". Other symptoms include feeling down/sad, tearfulness. She also finds herself with low libido which has caused some tension in her marriage. She does feel as though Lexapro helps with anxiety.   Observations/Objective:  Alert and oriented. Appears well, not sickly. No distress. Speaking in complete sentences.   Assessment and Plan:  Deteriorated. Also feels "numb" and without happy emotion on Lexapro, but anxiety is well controlled.  She has failed numerous other medications. Continue Lexapro as this helps to control anxiety. Add on low dose bupropion 75 mg BID for depression.  Given that she has failed numerous medications and has ongoing symptoms, we will send her to psychiatry for evaluation. Referral placed.  She will update in one month.   Follow Up Instructions:  You will be  contacted regarding your referral to psychiatry.  Please let Autumn [name] know if you have not been contacted within two weeks.   Start bupropion 75 mg twice daily for depression.  Continue taking Lexapro.   Please update me in one month.  It was a pleasure to see you today! Autumn Bossier, NP-C    I discussed the assessment and treatment plan with the patient. The patient was provided an opportunity to ask questions and all were answered. The patient agreed with the plan and demonstrated an understanding of the instructions.   The patient was advised to call back or seek an in-person evaluation if the symptoms worsen or if the condition fails to improve as anticipated.    Pleas Koch, NP    Review of Systems  Respiratory: Negative for shortness of breath.   Cardiovascular: Negative for chest pain.  Gastrointestinal: Negative for nausea.  Psychiatric/Behavioral:       See HPI       Past Medical History:  Diagnosis Date  . Anxiety   . Back pain    chronic  . Depression   . Disc disease, degenerative, lumbar or lumbosacral   . History of ovarian cyst   . Migraine   . Scoliosis   . Shingles 06/20/2018     Social History   Socioeconomic History  . Marital status: Married    Spouse name: Not on file  . Number of children: 1  . Years of education: Not on file  . Highest education level: Not on file  Occupational History  . Occupation: CMA  Tobacco Use  . Smoking status: Former Smoker    Types: Cigarettes    Quit date: 08/18/2016    Years since quitting: 3.3  . Smokeless tobacco: Never Used  . Tobacco comment: 3 cigarettes a day  Vaping Use  . Vaping Use: Never used  Substance and Sexual Activity  . Alcohol use: Yes    Alcohol/week: 0.0 standard drinks    Comment: occasional  . Drug use: No  . Sexual activity: Yes    Birth control/protection: Pill  Other Topics Concern  . Not on file  Social History Narrative  . Not on file   Social Determinants of Health    Financial Resource Strain:   . Difficulty of Paying Living Expenses:   Food Insecurity:   . Worried About Charity fundraiser in the Last Year:   . Arboriculturist in the Last Year:   Transportation Needs:   . Film/video editor (Medical):   Marland Kitchen Lack of Transportation (Non-Medical):   Physical Activity:   . Days of Exercise per Week:   . Minutes of Exercise per Session:   Stress:   . Feeling of Stress :   Social Connections:   . Frequency of Communication with Friends and Family:   . Frequency of Social Gatherings with Friends and Family:   . Attends Religious Services:   . Active Member of Clubs or Organizations:   . Attends Archivist Meetings:   Marland Kitchen Marital Status:   Intimate Partner Violence:   . Fear of Current or Ex-Partner:   . Emotionally Abused:   Marland Kitchen Physically Abused:   . Sexually Abused:     Past Surgical History:  Procedure Laterality Date  . APPENDECTOMY    . BREAST SURGERY Right 2008   lump/ benign    Family History  Problem Relation Age of Onset  . Cancer Mother 53       breast  . Breast cancer Mother 22       and 10  . Alcohol abuse Father   . Vision loss Maternal Grandmother   . Heart disease Maternal Grandmother   . Heart failure Maternal Grandmother   . Cancer Maternal Grandfather   . Alcohol abuse Paternal Grandfather   . Arthritis Maternal Aunt     Allergies  Allergen Reactions  . Imitrex [Sumatriptan] Other (See Comments)    Vomiting, Slurred Speech and Facial Numbness    Current Outpatient Medications on File Prior to Visit  Medication Sig Dispense Refill  . ALAYCEN 1/35 tablet TAKE 1 TABLET BY MOUTH DAILY. 84 tablet 3  . aspirin-acetaminophen-caffeine (EXCEDRIN MIGRAINE) 824-235-36 MG tablet Take by mouth every 6 (six) hours as needed for headache.    . escitalopram (LEXAPRO) 20 MG tablet TAKE 1 TABLET BY MOUTH DAILY. 90 tablet 1  . ibuprofen (ADVIL) 200 MG tablet Take 200 mg by mouth every 6 (six) hours as needed.    Marland Kitchen  levocetirizine (XYZAL) 5 MG tablet Take 1 tablet (5 mg total) by mouth every evening. 90 tablet 3  . omeprazole (PRILOSEC) 20 MG capsule Take 1 capsule (20 mg total) by mouth daily. 30 capsule 0  . valACYclovir (VALTREX) 1000 MG tablet      No current facility-administered medications on file prior to visit.    LMP 12/09/2019    Objective:   Physical Exam Constitutional:      General: She is not in acute distress.    Appearance: Normal appearance.  Pulmonary:  Effort: Pulmonary effort is normal.  Neurological:     Mental Status: She is alert and oriented to person, place, and time.  Psychiatric:        Mood and Affect: Mood normal.            Assessment & Plan:

## 2019-12-09 NOTE — Assessment & Plan Note (Signed)
Deteriorated. Also feels "numb" and without happy emotion on Lexapro, but anxiety is well controlled.  She has failed numerous other medications. Continue Lexapro as this helps to control anxiety. Add on low dose bupropion 75 mg BID for depression.  Given that she has failed numerous medications and has ongoing symptoms, we will send her to psychiatry for evaluation. Referral placed.  She will update in one month.

## 2019-12-09 NOTE — Patient Instructions (Signed)
You will be contacted regarding your referral to psychiatry.  Please let us know if you have not been contacted within two weeks.   Start bupropion 75 mg twice daily for depression.  Continue taking Lexapro.   Please update me in one month.  It was a pleasure to see you today! Allie Bossier, NP-C

## 2019-12-24 ENCOUNTER — Other Ambulatory Visit: Payer: Self-pay | Admitting: Primary Care

## 2019-12-24 DIAGNOSIS — R7303 Prediabetes: Secondary | ICD-10-CM

## 2020-01-04 ENCOUNTER — Other Ambulatory Visit: Payer: 59

## 2020-01-12 ENCOUNTER — Other Ambulatory Visit: Payer: Self-pay

## 2020-01-12 ENCOUNTER — Encounter: Payer: Self-pay | Admitting: Family Medicine

## 2020-01-12 ENCOUNTER — Ambulatory Visit: Payer: Self-pay

## 2020-01-12 ENCOUNTER — Ambulatory Visit (INDEPENDENT_AMBULATORY_CARE_PROVIDER_SITE_OTHER): Payer: 59 | Admitting: Family Medicine

## 2020-01-12 VITALS — BP 122/84 | HR 89 | Ht 69.5 in | Wt 245.0 lb

## 2020-01-12 DIAGNOSIS — M25532 Pain in left wrist: Secondary | ICD-10-CM | POA: Diagnosis not present

## 2020-01-12 DIAGNOSIS — G5602 Carpal tunnel syndrome, left upper limb: Secondary | ICD-10-CM | POA: Diagnosis not present

## 2020-01-12 DIAGNOSIS — M999 Biomechanical lesion, unspecified: Secondary | ICD-10-CM

## 2020-01-12 NOTE — Patient Instructions (Addendum)
Injection today for carpal tunnel Exercises 3x a week Avoid direct impact for next 10 days See me in 5-6 weeks

## 2020-01-12 NOTE — Progress Notes (Signed)
[place] Phone: [phone] Subjective:   Autumn [name], am serving as a scribe for Dr. Hulan Saas. This visit occurred during the SARS-CoV-2 public health emergency.  Safety protocols were in place, including screening questions prior to the visit, additional usage of staff PPE, and extensive cleaning of exam room while observing appropriate contact time as indicated for disinfecting solutions.   I'm seeing this patient by the request  of:  Pleas Koch, NP  CC: Wrist pain, neck and back pain follow-up  OBS:[id number]  Autumn [name] is a 34 y.o. female coming in with complaint of left wrist pain. OMT 07/29/2019. Patient states that in June her wrist started to bother her. Pain in [facility] joint that radiates into the 2nd metacarpal. Pain was severe enough where she was unable to pick up something as light as a bag of chips. Pain also in push up position. Pain is intermittent but sharp. Has been wearing cock-up wrist splint with hard metal stay over palmer aspect. Patient wears brace during the day. Uses computer throughout the day.  Medications patient has been prescribed: Ibuprofen  Taking: Intermittently         Reviewed prior external information including notes and imaging from previsou exam, outside providers and external EMR if available.   As well as notes that were available from care everywhere and other healthcare systems.  Past medical history, social, surgical and family history all reviewed in electronic medical record.  [name] pertanent information unless stated regarding to the chief complaint.   Past Medical History:  Diagnosis Date  . Anxiety   . Back pain    chronic  . Depression   . Disc disease, degenerative, lumbar or lumbosacral   . History of ovarian cyst   . Migraine   . Scoliosis   . Shingles 06/20/2018    Allergies  Allergen Reactions  . Imitrex [Sumatriptan] Other  (See Comments)    Vomiting, Slurred Speech and Facial Numbness     Review of Systems:  [name] headache, visual changes, nausea, vomiting, diarrhea, constipation, dizziness, abdominal pain, skin rash, fevers, chills, night sweats, weight loss, swollen lymph nodes, body aches, joint swelling, chest pain, shortness of breath, mood changes. POSITIVE muscle aches  Objective  There were [name] vitals taken for this visit.   General: [name] apparent distress alert and oriented x3 mood and affect normal, dressed appropriately.  HEENT: Pupils equal, extraocular movements intact  Respiratory: Patient's speak in full sentences and does not appear short of breath  Cardiovascular: [name] lower extremity edema, non tender, [name] erythema  Neuro: Cranial nerves II through XII are intact, neurovascularly intact in all extremities with 2+ DTRs and 2+ pulses.  Gait normal with good balance and coordination.  MSK:   Left wrist has a positive Tinel as well as a positive Phalen's.  More tenderness over the palmar aspect of the wrist and then the [facility] joint today.  Negative grind test noted today. Back -neck exam does have some mild loss of lordosis.  Some tenderness to palpation in the parascapular region right greater than left.  Patient does have a negative Spurling's.   Procedure: Real-time Ultrasound Guided Injection of left carpal tunnel Device: GE Logiq Q7 Ultrasound guided injection is preferred based studies that show increased duration, increased effect, greater accuracy, decreased procedural pain, increased response rate with ultrasound guided versus blind injection.  Verbal informed consent obtained.  Time-out conducted.  Noted [name] overlying  erythema, induration, or other signs of local infection.  Skin prepped in a sterile fashion.  Local anesthesia: Topical Ethyl chloride.  With sterile technique and under real time ultrasound guidance:  median nerve visualized.  23g 5/8 inch needle inserted distal to proximal  approach into nerve sheath. Pictures taken nfor needle placement. Patient did have injection of 2 cc of 0.5% Marcaine, and 1 cc of Kenalog 40 mg/dL. Completed without difficulty  Pain immediately resolved suggesting accurate placement of the medication.  Advised to call if fevers/chills, erythema, induration, drainage, or persistent bleeding.  Images permanently stored and available for review in the ultrasound unit.  Impression: Technically successful ultrasound guided injection.   Osteopathic findings  C2 flexed rotated and side bent right C6 flexed rotated and side bent left T3 extended rotated and side bent right inhaled rib T9 extended rotated and side bent left        Assessment and Plan:    Nonallopathic problems  Decision today to treat with OMT was based on Physical Exam  After verbal consent patient was treated with HVLA, ME, FPR techniques in cervical, rib, thoracic, lumbar, and sacral  areas  Patient tolerated the procedure well with improvement in symptoms  Patient given exercises, stretches and lifestyle modifications  See medications in patient instructions if given  Patient will follow up in 4-8 weeks      The above documentation has been reviewed and is accurate and complete Autumn Pulley, DO       Note: This dictation was prepared with Dragon dictation along with smaller phrase technology. Any transcriptional errors that result from this process are unintentional.

## 2020-01-12 NOTE — Assessment & Plan Note (Signed)
Patient given injection today and tolerated the procedure well.  Patient has had paresthesia for some time we will see if this is beneficial.  Seems to be an atypical presentation with more sensation of the thumb but no significant findings of any CMC arthritis noted.  Discussed with patient about icing regimen, home exercise, which activities to do which wants to avoid.  Discussed bracing at night.  Follow-up again in 4 to 6 weeks.

## 2020-02-02 ENCOUNTER — Other Ambulatory Visit: Payer: Self-pay | Admitting: Primary Care

## 2020-02-02 MED ORDER — ESCITALOPRAM OXALATE 20 MG PO TABS: 20.0000 mg | ORAL_TABLET | Freq: Every day | ORAL | 1 refills | Status: DC

## 2020-02-10 ENCOUNTER — Encounter: Payer: Self-pay | Admitting: Psychiatry

## 2020-02-10 ENCOUNTER — Telehealth (INDEPENDENT_AMBULATORY_CARE_PROVIDER_SITE_OTHER): Payer: 59 | Admitting: Psychiatry

## 2020-02-10 ENCOUNTER — Other Ambulatory Visit: Payer: Self-pay

## 2020-02-10 DIAGNOSIS — F3342 Major depressive disorder, recurrent, in full remission: Secondary | ICD-10-CM

## 2020-02-10 DIAGNOSIS — F418 Other specified anxiety disorders: Secondary | ICD-10-CM | POA: Insufficient documentation

## 2020-02-10 DIAGNOSIS — F419 Anxiety disorder, unspecified: Secondary | ICD-10-CM | POA: Diagnosis not present

## 2020-02-10 DIAGNOSIS — F439 Reaction to severe stress, unspecified: Secondary | ICD-10-CM | POA: Insufficient documentation

## 2020-02-10 MED ORDER — ESCITALOPRAM OXALATE 10 MG PO TABS: 15.0000 mg | ORAL_TABLET | Freq: Every day | ORAL | 1 refills | Status: DC

## 2020-02-10 MED ORDER — PROPRANOLOL HCL 10 MG PO TABS: 10.0000 mg | ORAL_TABLET | Freq: Two times a day (BID) | ORAL | 1 refills | Status: DC | PRN
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
## 2020-02-17 ENCOUNTER — Ambulatory Visit: Payer: 59 | Admitting: Family Medicine

## 2020-03-08 DIAGNOSIS — Z135 Encounter for screening for eye and ear disorders: Secondary | ICD-10-CM | POA: Diagnosis not present

## 2020-03-08 DIAGNOSIS — H5213 Myopia, bilateral: Secondary | ICD-10-CM | POA: Diagnosis not present

## 2020-03-10 ENCOUNTER — Other Ambulatory Visit: Payer: Self-pay

## 2020-03-10 ENCOUNTER — Telehealth (INDEPENDENT_AMBULATORY_CARE_PROVIDER_SITE_OTHER): Payer: 59 | Admitting: Primary Care

## 2020-03-10 DIAGNOSIS — R059 Cough, unspecified: Secondary | ICD-10-CM

## 2020-03-10 DIAGNOSIS — Z03818 Encounter for observation for suspected exposure to other biological agents ruled out: Secondary | ICD-10-CM | POA: Diagnosis not present

## 2020-03-10 DIAGNOSIS — Z1152 Encounter for screening for COVID-19: Secondary | ICD-10-CM | POA: Diagnosis not present

## 2020-03-10 DIAGNOSIS — R05 Cough: Secondary | ICD-10-CM | POA: Diagnosis not present

## 2020-03-10 MED ORDER — HYDROCOD POLST-CPM POLST ER 10-8 MG/5ML PO SUER: 5.0000 mL | Freq: Two times a day (BID) | ORAL | 0 refills | Status: DC | PRN

## 2020-03-10 NOTE — Patient Instructions (Signed)
Please update me once you receive your test result.  You may take the cough suppressant every 12 hours as needed for cough and rest. Caution this medication contains codeine which may cause drowsiness.   It was a pleasure to see you today! Allie Bossier, NP-C

## 2020-03-10 NOTE — Assessment & Plan Note (Signed)
Symptoms suspicious for Covid-19, testing is pending. She would qualify for MAB treatment pending positive result. Discussed appropriate treatment OTC. Will send in Tussionex cough suppressant for rest. She will update later once she has results.

## 2020-03-10 NOTE — Progress Notes (Signed)
Subjective:    Patient ID: Autumn [name], female    DOB: 07-03-1985, 34 y.o.   MRN: [id number]  HPI  This visit occurred during the SARS-CoV-2 public health emergency.  Safety protocols were in place, including screening questions prior to the visit, additional usage of staff PPE, and extensive cleaning of exam room while observing appropriate contact time as indicated for disinfecting solutions.   Virtual Visit via Video Note  I connected with Autumn [name] on 03/10/20 at 12:00 PM EDT by a video enabled telemedicine application and verified that I am speaking with the correct person using two identifiers.  Location: Patient: Home Provider: Office   I discussed the limitations of evaluation and management by telemedicine and the availability of in person appointments. The patient expressed understanding and agreed to proceed.  History of Present Illness:  Autumn [name] is a 34 year old female who presents today with a chief complaint of cough.  Symptoms began two days ago with nasal congestion, sneezing, rhinorrhea, sore throat. Yesterday she began to notice sweating, fatigue, weakness, had to change her clothes. This morning she woke up with chest burning, cough, sore throat, dry throat with irritation, facial flushing, headache. She continues to notice her other symptoms as well.  She's been using nasal saline spray, throat lozenges, nasal decongestant tablets, Benadryl, vapor rub, hot tea with honey, Dayquil, Delsym, Tylenol and Ibuprofen.   She's noticed a fever just now of 99.1. She presented to a drive through [id number] testing this morning, will have the test results tonight. She works from home, denies known exposure to [place]. Her son is in school, several children from his school have had Covid-19. She did go to [employer] this past weekend, most everyone was wearing a mask.   Her son had similar symptoms a few days ago which have improved.      Observations/Objective:  Alert and oriented. Appears well, not sickly. No distress. Speaking in complete sentences.   Assessment and Plan:  Symptoms suspicious for Covid-19, testing is pending. She would qualify for MAB treatment pending positive result. Discussed appropriate treatment OTC. Will send in Tussionex cough suppressant for rest. She will update later once she has results.   Follow Up Instructions:  Please update me once you receive your test result.  You may take the cough suppressant every 12 hours as needed for cough and rest. Caution this medication contains codeine which may cause drowsiness.   It was a pleasure to see you today! Allie Bossier, NP-C    I discussed the assessment and treatment plan with the patient. The patient was provided an opportunity to ask questions and all were answered. The patient agreed with the plan and demonstrated an understanding of the instructions.   The patient was advised to call back or seek an in-person evaluation if the symptoms worsen or if the condition fails to improve as anticipated.    Pleas Koch, NP    Review of Systems  Constitutional: Positive for fatigue and fever. Negative for chills.  HENT: Positive for congestion, sneezing and sore throat.   Respiratory: Positive for cough.   Musculoskeletal: Positive for myalgias.  Neurological: Positive for headaches.       Past Medical History:  Diagnosis Date  . Anxiety   . Back pain    chronic  . Depression   . Disc disease, degenerative, lumbar or lumbosacral   . History of ovarian cyst   . Migraine   . Scoliosis   .  Shingles 06/20/2018     Social History   Socioeconomic History  . Marital status: Married    Spouse name: Not on file  . Number of children: 1  . Years of education: Not on file  . Highest education level: Not on file  Occupational History  . Occupation: Transport planner  Tobacco Use  . Smoking status: Former Smoker    Types:  Cigarettes    Quit date: 08/18/2016    Years since quitting: 3.5  . Smokeless tobacco: Never Used  . Tobacco comment: 3 cigarettes a day  Vaping Use  . Vaping Use: Never used  Substance and Sexual Activity  . Alcohol use: Yes    Alcohol/week: 0.0 standard drinks    Comment: occasional  . Drug use: No  . Sexual activity: Yes    Birth control/protection: Pill  Other Topics Concern  . Not on file  Social History Narrative  . Not on file   Social Determinants of Health   Financial Resource Strain:   . Difficulty of Paying Living Expenses: Not on file  Food Insecurity:   . Worried About Charity fundraiser in the Last Year: Not on file  . Ran Out of Food in the Last Year: Not on file  Transportation Needs:   . Lack of Transportation (Medical): Not on file  . Lack of Transportation (Non-Medical): Not on file  Physical Activity:   . Days of Exercise per Week: Not on file  . Minutes of Exercise per Session: Not on file  Stress:   . Feeling of Stress : Not on file  Social Connections:   . Frequency of Communication with Friends and Family: Not on file  . Frequency of Social Gatherings with Friends and Family: Not on file  . Attends Religious Services: Not on file  . Active Member of Clubs or Organizations: Not on file  . Attends Archivist Meetings: Not on file  . Marital Status: Not on file  Intimate Partner Violence:   . Fear of Current or Ex-Partner: Not on file  . Emotionally Abused: Not on file  . Physically Abused: Not on file  . Sexually Abused: Not on file    Past Surgical History:  Procedure Laterality Date  . APPENDECTOMY    . BREAST SURGERY Right 2008   lump/ benign    Family History  Problem Relation Age of Onset  . Cancer Mother 26       breast  . Breast cancer Mother 24       and 27  . Alcohol abuse Father   . Vision loss Maternal Grandmother   . Heart disease Maternal Grandmother   . Heart failure Maternal Grandmother   . Cancer Maternal  Grandfather   . Alcohol abuse Paternal Grandfather   . Arthritis Maternal Aunt   . Anxiety disorder Maternal Aunt   . Depression Maternal Aunt     Allergies  Allergen Reactions  . Imitrex [Sumatriptan] Other (See Comments)    Vomiting, Slurred Speech and Facial Numbness    Current Outpatient Medications on File Prior to Visit  Medication Sig Dispense Refill  . ALAYCEN 1/35 tablet TAKE 1 TABLET BY MOUTH DAILY. 84 tablet 3  . aspirin-acetaminophen-caffeine (EXCEDRIN MIGRAINE) 009-233-00 MG tablet Take by mouth every 6 (six) hours as needed for headache.    . escitalopram (LEXAPRO) 10 MG tablet Take 1.5 tablets (15 mg total) by mouth daily. 45 tablet 1  . ibuprofen (ADVIL) 200 MG tablet Take 200 mg  by mouth every 6 (six) hours as needed.    Marland Kitchen levocetirizine (XYZAL) 5 MG tablet Take 1 tablet (5 mg total) by mouth every evening. 90 tablet 3  . omeprazole (PRILOSEC) 20 MG capsule Take 1 capsule (20 mg total) by mouth daily. 30 capsule 0  . propranolol (INDERAL) 10 MG tablet Take 1 tablet (10 mg total) by mouth 2 (two) times daily as needed. For anxiety attacks 60 tablet 1  . valACYclovir (VALTREX) 1000 MG tablet      No current facility-administered medications on file prior to visit.    Temp 99.1 F (37.3 C) (Oral)   Ht 5' 9.5" (1.765 m)   Wt 245 lb (111.1 kg)   BMI 35.66 kg/m    Objective:   Physical Exam Constitutional:      General: She is not in acute distress.    Appearance: She is ill-appearing.  Pulmonary:     Effort: Pulmonary effort is normal.     Comments: No cough during visit Neurological:     Mental Status: She is alert and oriented to person, place, and time.  Psychiatric:        Mood and Affect: Mood normal.            Assessment & Plan:

## 2020-03-11 DIAGNOSIS — R059 Cough, unspecified: Secondary | ICD-10-CM

## 2020-03-13 MED ORDER — BENZONATATE 200 MG PO CAPS: 200.0000 mg | ORAL_CAPSULE | Freq: Three times a day (TID) | ORAL | 0 refills | Status: DC | PRN

## 2020-03-13 NOTE — Telephone Encounter (Signed)
Spoke with patient today regarding symptoms, she sounded well and was in no distress. We agreed that if her symptoms don't improve over the next 1-2 days, then chest xray would be warranted. We can ask Tamra and Terri about their comfort level with her coming in for an xray. She tested negative for Covid-19 last week.  Joellen, will you call to check on her in the afternoon on 03/14/20? I told her you would be calling.

## 2020-03-15 NOTE — Telephone Encounter (Signed)
Called patient she is feeling better. Will hold off and keep an eye on things and let us know how she is feeling at end of week.

## 2020-03-17 ENCOUNTER — Telehealth (INDEPENDENT_AMBULATORY_CARE_PROVIDER_SITE_OTHER): Payer: 59 | Admitting: Psychiatry

## 2020-03-17 ENCOUNTER — Other Ambulatory Visit: Payer: Self-pay

## 2020-03-17 ENCOUNTER — Encounter: Payer: Self-pay | Admitting: Psychiatry

## 2020-03-17 DIAGNOSIS — F3342 Major depressive disorder, recurrent, in full remission: Secondary | ICD-10-CM | POA: Diagnosis not present

## 2020-03-17 DIAGNOSIS — F419 Anxiety disorder, unspecified: Secondary | ICD-10-CM

## 2020-03-17 MED ORDER — ESCITALOPRAM OXALATE 10 MG PO TABS: 10.0000 mg | ORAL_TABLET | Freq: Every day | ORAL | 1 refills | Status: DC

## 2020-03-17 NOTE — Progress Notes (Signed)
Provider Location : [facility] Patient Location : Home  Participants: Patient , Provider  Virtual Visit via Video Note  I connected with Autumn [name] on 03/17/20 at  1:30 PM EDT by a video enabled telemedicine application and verified that I am speaking with the correct person using two identifiers.   I discussed the limitations of evaluation and management by telemedicine and the availability of in person appointments. The patient expressed understanding and agreed to proceed.     I discussed the assessment and treatment plan with the patient. The patient was provided an opportunity to ask questions and all were answered. The patient agreed with the plan and demonstrated an understanding of the instructions.   The patient was advised to call back or seek an in-person evaluation if the symptoms worsen or if the condition fails to improve as anticipated.   [name]  03/17/2020 1:48 PM Autumn [name]  MRN:  [id number]  Chief Complaint:  Chief Complaint    Follow-up     HPI: Autumn [name] is a 34 year old Caucasian female, employed, married, lives in [place], has a history of depression, anxiety, migraine headaches, history of ovarian cyst, degenerative disc disease was evaluated by telemedicine today.  Patient today reports she has started taking a lower dosage of Autumn [name].  She reports she is doing well.  The first few days she felt dizzy however it got better as she continued to take the lower dosage.  She has not noticed any changes in her mood like worsening depression or anxiety since going down on the dosage.  She reports sleep as good.  She gets around 8 to 9 hours of sleep every night.  She feels rested when she wakes up in the morning.  Patient denies any significant anxiety symptoms at this time.  She denies any suicidality, homicidality or perceptual disturbances.  She is currently recovering from bronchitis and continues to feel congested  although improving.  Patient denies any other concerns today.  Visit Diagnosis:    ICD-10-CM   1. MDD (major depressive disorder), recurrent, in full remission ([place])  F33.42 escitalopram (Autumn [name]) 10 MG tablet  2. Anxiety disorder, unspecified type  F41.9 escitalopram (Autumn [name]) 10 MG tablet    Past Psychiatric History: I have reviewed past psychiatric history from my progress note on 02/10/2020.  Past trials of Autumn [name], Wellbutrin-noncompliant  Past Medical History:  Past Medical History:  Diagnosis Date  . Anxiety   . Back pain    chronic  . Depression   . Disc disease, degenerative, lumbar or lumbosacral   . History of ovarian cyst   . Migraine   . Scoliosis   . Shingles 06/20/2018    Past Surgical History:  Procedure Laterality Date  . APPENDECTOMY    . BREAST SURGERY Right 2008   lump/ benign    Family Psychiatric History: I have reviewed family psychiatric history from my progress note on 02/10/2020.  Family History:  Family History  Problem Relation Age of Onset  . Cancer Mother 78       breast  . Breast cancer Mother 87       and 48  . Alcohol abuse Father   . Vision loss Maternal Grandmother   . Heart disease Maternal Grandmother   . Heart failure Maternal Grandmother   . Cancer Maternal Grandfather   . Alcohol abuse Paternal Grandfather   . Arthritis Maternal Aunt   . Anxiety disorder Maternal Aunt   . Depression Maternal Aunt  Social History: I have reviewed social history from my progress note on 02/10/2020. Social History   Socioeconomic History  . Marital status: Married    Spouse name: Not on file  . Number of children: 1  . Years of education: Not on file  . Highest education level: Not on file  Occupational History  . Occupation: Transport planner  Tobacco Use  . Smoking status: Former Smoker    Types: Cigarettes    Quit date: 08/18/2016    Years since quitting: 3.5  . Smokeless tobacco: Never Used  . Tobacco comment: 3 cigarettes a day   Vaping Use  . Vaping Use: Never used  Substance and Sexual Activity  . Alcohol use: Yes    Alcohol/week: 0.0 standard drinks    Comment: occasional  . Drug use: No  . Sexual activity: Yes    Birth control/protection: Pill  Other Topics Concern  . Not on file  Social History Narrative  . Not on file   Social Determinants of Health   Financial Resource Strain:   . Difficulty of Paying Living Expenses: Not on file  Food Insecurity:   . Worried About Charity fundraiser in the Last Year: Not on file  . Ran Out of Food in the Last Year: Not on file  Transportation Needs:   . Lack of Transportation (Medical): Not on file  . Lack of Transportation (Non-Medical): Not on file  Physical Activity:   . Days of Exercise per Week: Not on file  . Minutes of Exercise per Session: Not on file  Stress:   . Feeling of Stress : Not on file  Social Connections:   . Frequency of Communication with Friends and Family: Not on file  . Frequency of Social Gatherings with Friends and Family: Not on file  . Attends Religious Services: Not on file  . Active Member of Clubs or Organizations: Not on file  . Attends Archivist Meetings: Not on file  . Marital Status: Not on file    Allergies:  Allergies  Allergen Reactions  . Imitrex [Sumatriptan] Other (See Comments)    Vomiting, Slurred Speech and Facial Numbness    Metabolic Disorder Labs: Lab Results  Component Value Date   HGBA1C 5.8 06/30/2019   No results found for: PROLACTIN Lab Results  Component Value Date   CHOL 174 11/05/2018   TRIG 139.0 11/05/2018   HDL 53.80 11/05/2018   CHOLHDL 3 11/05/2018   VLDL 27.8 11/05/2018   LDLCALC 92 11/05/2018   LDLCALC 96 08/30/2016   Lab Results  Component Value Date   TSH 1.10 08/15/2018   TSH 1.69 08/30/2016    Therapeutic Level Labs: No results found for: LITHIUM No results found for: VALPROATE No components found for:  CBMZ  Current Medications: Current Outpatient  Medications  Medication Sig Dispense Refill  . ALAYCEN 1/35 tablet TAKE 1 TABLET BY MOUTH DAILY. 84 tablet 3  . aspirin-acetaminophen-caffeine (EXCEDRIN MIGRAINE) 474-259-56 MG tablet Take by mouth every 6 (six) hours as needed for headache.    . ibuprofen (ADVIL) 200 MG tablet Take 200 mg by mouth every 6 (six) hours as needed.    Marland Kitchen omeprazole (PRILOSEC) 20 MG capsule Take 1 capsule (20 mg total) by mouth daily. 30 capsule 0  . valACYclovir (VALTREX) 1000 MG tablet     . benzonatate (TESSALON) 200 MG capsule Take 1 capsule (200 mg total) by mouth 3 (three) times daily as needed for cough. (Patient not taking: Reported on  03/17/2020) 21 capsule 0  . chlorpheniramine-HYDROcodone (TUSSIONEX PENNKINETIC ER) 10-8 MG/5ML SUER Take 5 mLs by mouth every 12 (twelve) hours as needed for cough. (Patient not taking: Reported on 03/17/2020) 50 mL 0  . escitalopram (Autumn [name]) 10 MG tablet Take 1 tablet (10 mg total) by mouth daily. 30 tablet 1  . levocetirizine (XYZAL) 5 MG tablet Take 1 tablet (5 mg total) by mouth every evening. (Patient not taking: Reported on 03/17/2020) 90 tablet 3  . propranolol (INDERAL) 10 MG tablet Take 1 tablet (10 mg total) by mouth 2 (two) times daily as needed. For anxiety attacks (Patient not taking: Reported on 03/17/2020) 60 tablet 1   No current facility-administered medications for this visit.     Musculoskeletal: Strength & Muscle Tone: UTA Gait & Station: normal Patient leans: N/A  Psychiatric Specialty Exam: Review of Systems  HENT: Positive for congestion.   Psychiatric/Behavioral: Negative for agitation, behavioral problems, confusion, decreased concentration, dysphoric mood, hallucinations, self-injury, sleep disturbance and suicidal ideas. The patient is not nervous/anxious and is not hyperactive.   All other systems reviewed and are negative.   There were no vitals taken for this visit.There is no height or weight on file to calculate BMI.  General Appearance:  Casual  Eye Contact:  Fair  Speech:  Clear and Coherent  Volume:  Normal  Mood:  Euthymic  Affect:  Congruent  Thought Process:  Goal Directed and Descriptions of Associations: Intact  Orientation:  Full (Time, Place, and Person)  Thought Content: Logical   Suicidal Thoughts:  No  Homicidal Thoughts:  No  Memory:  Immediate;   Fair Recent;   Fair Remote;   Fair  Judgement:  Fair  Insight:  Fair  Psychomotor Activity:  Normal  Concentration:  Concentration: Fair and Attention Span: Fair  Recall:  [employer] of Knowledge: Fair  Language: Fair  Akathisia:  No  Handed:  Right  AIMS (if indicated): UTA  Assets:  Communication Skills Desire for Improvement Housing Social Support  ADL's:  Intact  Cognition: WNL  Sleep:  Fair   Screenings: GAD-7     Telemedicine from 02/10/2020 in [place] Office Visit from 10/28/2017 in [place]  Total GAD-7 Score 3 4    PHQ2-9     Telemedicine from 02/10/2020 in [place] Office Visit from 11/12/2018 in [place] Visit from 10/28/2017 in [place] Visit from 06/19/2017 in [place] Visit from 08/30/2016 in [place] at [facility]  PHQ-2 Total Score 1 0 0 2 0  PHQ-9 Total Score -- -- 3 9 1        Assessment and Plan: Autumn [name] is a 34 year old Caucasian female, employed, married, lives in [place], has a history of depression, anxiety was evaluated by telemedicine today.  She is biologically predisposed given her family history and history of trauma.  Patient with psychosocial stressors of the current pandemic.  Patient is interested in being tapered off of the Autumn [name].  Discussed plan as noted below.  Plan MDD in remission Reduce Autumn [name] to 10 mg p.o. daily   Anxiety disorder unspecified-improving Propranolol 10 mg p.o. twice daily as  needed. Patient reports she never had to take it.  Follow-up in clinic in 2 months or sooner if needed.  I have spent atleast 20 minutes face to face by video with patient today. More than 50 % of the time was spent for preparing  to see the patient ( e.g., review of test, records ), ordering medications and test ,psychoeducation and supportive psychotherapy and care coordination,as well as documenting clinical information in electronic health record. This note was generated in part or whole with voice recognition software. Voice recognition is usually quite accurate but there are transcription errors that can and very often do occur. I apologize for any typographical errors that were not detected and corrected.     Ursula Alert, MD 03/17/2020, 1:48 PM

## 2020-03-24 ENCOUNTER — Ambulatory Visit: Payer: 59 | Admitting: Family Medicine

## 2020-03-24 ENCOUNTER — Other Ambulatory Visit: Payer: Self-pay

## 2020-03-24 ENCOUNTER — Ambulatory Visit: Payer: Self-pay

## 2020-03-24 ENCOUNTER — Encounter: Payer: Self-pay | Admitting: Family Medicine

## 2020-03-24 VITALS — BP 102/70 | HR 87 | Ht 69.0 in | Wt 249.0 lb

## 2020-03-24 DIAGNOSIS — M542 Cervicalgia: Secondary | ICD-10-CM | POA: Diagnosis not present

## 2020-03-24 DIAGNOSIS — M25532 Pain in left wrist: Secondary | ICD-10-CM | POA: Diagnosis not present

## 2020-03-24 DIAGNOSIS — M999 Biomechanical lesion, unspecified: Secondary | ICD-10-CM | POA: Diagnosis not present

## 2020-03-24 DIAGNOSIS — M549 Dorsalgia, unspecified: Secondary | ICD-10-CM

## 2020-03-24 DIAGNOSIS — G8929 Other chronic pain: Secondary | ICD-10-CM | POA: Diagnosis not present

## 2020-03-24 DIAGNOSIS — G5602 Carpal tunnel syndrome, left upper limb: Secondary | ICD-10-CM | POA: Diagnosis not present

## 2020-03-24 NOTE — Assessment & Plan Note (Signed)
Patient responded very well to the injection.  We will continue to monitor.

## 2020-03-24 NOTE — Progress Notes (Signed)
[place] Phone: [phone] Subjective:   [name], am serving as a scribe for Dr. Hulan Saas. This visit occurred during the SARS-CoV-2 public health emergency.  Safety protocols were in place, including screening questions prior to the visit, additional usage of staff PPE, and extensive cleaning of exam room while observing appropriate contact time as indicated for disinfecting solutions.  I'm seeing this patient by the request  of:  Pleas Koch, NP  CC: Neck pain follow-up  [id number]   01/12/2020 Patient given injection today and tolerated the procedure well.  Patient has had paresthesia for some time we will see if this is beneficial.  Seems to be an atypical presentation with more sensation of the thumb but no significant findings of any CMC arthritis noted.  Discussed with patient about icing regimen, home exercise, which activities to do which wants to avoid.  Discussed bracing at night.  Follow-up again in 4 to 6 weeks.   Update 03/24/2020 ANIESHA HAUGHN is a 34 y.o. female coming in with complaint of back and neck pain and left wrist pain. Patient states that her wrist pain has improved since injection. Pain in wrist, radial side with wrist extension.  Patient states that the wrist is significantly better 9800% better overall just the things noted above  No major flares up since last visit in her back. Stretches to alleviate her pain.  Mild tightness in the back but nothing severe.  Has been able to be a little more active recently.           Reviewed prior external information including notes and imaging from previsou exam, outside providers and external EMR if available.   As well as notes that were available from care everywhere and other healthcare systems.  Past medical history, social, surgical and family history all reviewed in electronic medical record.  No pertanent  information unless stated regarding to the chief complaint.   Past Medical History:  Diagnosis Date  . Anxiety   . Back pain    chronic  . Depression   . Disc disease, degenerative, lumbar or lumbosacral   . History of ovarian cyst   . Migraine   . Scoliosis   . Shingles 06/20/2018    Allergies  Allergen Reactions  . Imitrex [Sumatriptan] Other (See Comments)    Vomiting, Slurred Speech and Facial Numbness     Review of Systems:  No headache, visual changes, nausea, vomiting, diarrhea, constipation, dizziness, abdominal pain, skin rash, fevers, chills, night sweats, weight loss, swollen lymph nodes, body aches, joint swelling, chest pain, shortness of breath, mood changes. POSITIVE muscle aches  Objective  Blood pressure 102/70, pulse 87, height 5\' 9"  (1.753 m), weight 249 lb (112.9 kg), SpO2 98 %.   General: No apparent distress alert and oriented x3 mood and affect normal, dressed appropriately.  HEENT: Pupils equal, extraocular movements intact  Respiratory: Patient's speak in full sentences and does not appear short of breath  Cardiovascular: No lower extremity edema, non tender, no erythema  Neuro: Cranial nerves II through XII are intact, neurovascularly intact in all extremities with 2+ DTRs and 2+ pulses.  Gait normal with good balance and coordination.  MSK:   Low back exam does have some mild loss of lordosis, still some mild poor core strength.  Hip abductor strength 4+ out of 5 but symmetric. Neck exam does show some mild loss of lordosis, negative Spurling's.  Patient does  have some tightness noted in the left side of the neck only today which is an improvement  Osteopathic findings  C6 flexed rotated and side bent left T3 extended rotated and side bent right inhaled rib T9 extended rotated and side bent left       Assessment and Plan:  Chronic neck and back pain Chronic neck and back pain. Chronic with mild exacerbation  Attempted OMT.  Discussed HEP  and ergonomoics  meds ibuprofen  rtc in 6-8 weeks   Left carpal tunnel syndrome Patient responded very well to the injection.  We will continue to monitor.    Nonallopathic problems  Decision today to treat with OMT was based on Physical Exam  After verbal consent patient was treated with HVLA, ME, FPR techniques in cervical, rib, thoracic areas  Patient tolerated the procedure well with improvement in symptoms  Patient given exercises, stretches and lifestyle modifications  See medications in patient instructions if given  Patient will follow up in 4-8 weeks      The above documentation has been reviewed and is accurate and complete Lyndal Pulley, DO       Note: This dictation was prepared with Dragon dictation along with smaller phrase technology. Any transcriptional errors that result from this process are unintentional.

## 2020-03-24 NOTE — Patient Instructions (Signed)
Good to see you  Doing well  See me again in 8-10 weeks

## 2020-03-24 NOTE — Assessment & Plan Note (Signed)
Chronic neck and back pain. Chronic with mild exacerbation  Attempted OMT.  Discussed HEP and ergonomoics  meds ibuprofen  rtc in 6-8 weeks

## 2020-04-08 ENCOUNTER — Other Ambulatory Visit: Payer: Self-pay | Admitting: Family Medicine

## 2020-04-08 ENCOUNTER — Telehealth (INDEPENDENT_AMBULATORY_CARE_PROVIDER_SITE_OTHER): Payer: 59 | Admitting: Family Medicine

## 2020-04-08 ENCOUNTER — Encounter: Payer: Self-pay | Admitting: Family Medicine

## 2020-04-08 ENCOUNTER — Telehealth: Payer: Self-pay

## 2020-04-08 DIAGNOSIS — G43701 Chronic migraine without aura, not intractable, with status migrainosus: Secondary | ICD-10-CM

## 2020-04-08 DIAGNOSIS — G43909 Migraine, unspecified, not intractable, without status migrainosus: Secondary | ICD-10-CM | POA: Insufficient documentation

## 2020-04-08 MED ORDER — METAXALONE 400 MG PO TABS: 400.0000 mg | ORAL_TABLET | Freq: Three times a day (TID) | ORAL | 0 refills | Status: DC | PRN

## 2020-04-08 MED ORDER — PROMETHAZINE HCL 25 MG PO TABS: 25.0000 mg | ORAL_TABLET | Freq: Three times a day (TID) | ORAL | 0 refills | Status: DC | PRN

## 2020-04-08 NOTE — Patient Instructions (Signed)
Use ice on right temple  Rest/ drink fluids A little caffeine may help  Try phenergan for nausea and headache Skelaxin for neck spasm and headache  Watch for sedation with these medications  Update if not starting to improve in a week or if worsening  If severe please go to ER

## 2020-04-08 NOTE — Telephone Encounter (Signed)
I will see her then  

## 2020-04-08 NOTE — Progress Notes (Signed)
Virtual Visit via Video Note  I connected with Autumn [name] on 04/08/20 at 12:30 PM EDT by a video enabled telemedicine application and verified that I am speaking with the correct person using two identifiers.  Location: Patient: home Provider: office   I discussed the limitations of evaluation and management by telemedicine and the availability of in person appointments. The patient expressed understanding and agreed to proceed.  Parties involved in encounter  Patient: Doctor, general practice   Provider:  Loura Pardon MD    History of Present Illness: Pt presents with c/o headache   Wt Readings from Last 3 Encounters:  04/08/20 246 lb 8 oz (111.8 kg)  03/24/20 249 lb (112.9 kg)  03/10/20 245 lb (111.1 kg)   36.40 kg/m  BP Readings from Last 3 Encounters:  04/08/20 124/82  03/24/20 102/70  01/12/20 122/84    H/o migraine headaches  Woke up with pain yesterday  Throbbing R temple  Ibuprofen 800 mg did not help much  Took excedrin 2 tabs later in the day  Then another 800 mg of ibuprofen at 7 pm Lay down with pressure on head- better for an hour  It came back   Nausea all day  Feels sensation in the back of her eye / both pressure and pain  Position change worsens it - bending down   Had a crick in her neck 2 days ago -on the R side  Headaches goes to neck a bit  Sore temple area   Same symptoms last weekend   Has been stressed -worry about family  Changing jobs  Caffeine in am -not much / then occ a diet coke  Drinks a lot of water  OC is alaycen 1/35- does not notice worse during period      Patient Active Problem List   Diagnosis Date Noted  . MDD (major depressive disorder), recurrent, in full remission ([place]) 02/10/2020  . Anxiety disorder 02/10/2020  . Left carpal tunnel syndrome 01/12/2020  . Left upper quadrant abdominal pain 10/23/2019  . Paresthesia 06/30/2019  . Post-infection bronchospasm 03/27/2019  . Cough 03/04/2019  . Chronic  neck and back pain 04/16/2018  . Nonallopathic lesion of cervical region 04/16/2018  . Nonallopathic lesion of rib cage 04/16/2018  . Nonallopathic lesion of thoracic region 04/16/2018  . Contraception management 08/30/2016  . Family history of breast cancer in mother 08/30/2016  . Well woman exam with routine gynecological exam 08/19/2014  . Obesity (BMI 30.0-34.9) 08/19/2014  . Amenorrhea 12/25/2013  . Anxiety and depression 07/15/2012  . Herpes simplex virus (HSV) infection 11/01/2009   Past Medical History:  Diagnosis Date  . Anxiety   . Back pain    chronic  . Depression   . Disc disease, degenerative, lumbar or lumbosacral   . History of ovarian cyst   . Migraine   . Scoliosis   . Shingles 06/20/2018   Past Surgical History:  Procedure Laterality Date  . APPENDECTOMY    . BREAST SURGERY Right 2008   lump/ benign   Social History   Tobacco Use  . Smoking status: Former Smoker    Types: Cigarettes    Quit date: 08/18/2016    Years since quitting: 3.6  . Smokeless tobacco: Never Used  . Tobacco comment: 3 cigarettes a day  Vaping Use  . Vaping Use: Never used  Substance Use Topics  . Alcohol use: Yes    Alcohol/week: 0.0 standard drinks    Comment: occasional  . Drug use: No  Family History  Problem Relation Age of Onset  . Cancer Mother 71       breast  . Breast cancer Mother 64       and 19  . Alcohol abuse Father   . Vision loss Maternal Grandmother   . Heart disease Maternal Grandmother   . Heart failure Maternal Grandmother   . Cancer Maternal Grandfather   . Alcohol abuse Paternal Grandfather   . Arthritis Maternal Aunt   . Anxiety disorder Maternal Aunt   . Depression Maternal Aunt    Allergies  Allergen Reactions  . Imitrex [Sumatriptan] Other (See Comments)    Vomiting, Slurred Speech and Facial Numbness   Current Outpatient Medications on File Prior to Visit  Medication Sig Dispense Refill  . ALAYCEN 1/35 tablet TAKE 1 TABLET BY MOUTH  DAILY. 84 tablet 3  . aspirin-acetaminophen-caffeine (EXCEDRIN MIGRAINE) [id number] MG tablet Take by mouth every 6 (six) hours as needed for headache.    . escitalopram (LEXAPRO) 10 MG tablet Take 1 tablet (10 mg total) by mouth daily. 30 tablet 1  . ibuprofen (ADVIL) 200 MG tablet Take 200 mg by mouth every 6 (six) hours as needed.    . propranolol (INDERAL) 10 MG tablet Take 1 tablet (10 mg total) by mouth 2 (two) times daily as needed. For anxiety attacks (Patient not taking: Reported on 04/08/2020) 60 tablet 1  . valACYclovir (VALTREX) 1000 MG tablet  (Patient not taking: Reported on 04/08/2020)     No current facility-administered medications on file prior to visit.   Review of Systems  Constitutional: Negative for chills, fever and malaise/fatigue.  HENT: Negative for congestion, ear pain, sinus pain and sore throat.   Eyes: Negative for blurred vision, discharge and redness.  Respiratory: Negative for cough, shortness of breath and stridor.   Cardiovascular: Negative for chest pain, palpitations and leg swelling.  Gastrointestinal: Positive for nausea. Negative for abdominal pain, diarrhea and vomiting.  Musculoskeletal: Positive for neck pain. Negative for myalgias.  Skin: Negative for rash.  Neurological: Positive for headaches. Negative for dizziness.    Observations/Objective: Patient appears well, in no distress Weight is baseline  No facial swelling or asymmetry She notes mild tenderness over R temple Normal voice-not hoarse and no slurred speech No obvious tremor or mobility impairment Moving neck and UEs normally Able to hear the call well  No cough or shortness of breath during interview  Talkative and mentally sharp with no cognitive changes No skin changes on face or neck , no rash or pallor Affect is normal    Assessment and Plan: Problem List Items Addressed This Visit      Cardiovascular and Mediastinum   Migraine    Migraine for over a day- not relieved  by standard nsaid tx  Disc habits and caffeine /water intake  Disc role of neck muscles Adv to hydrate/ try a caff beverage/ use ice on painful area  Watch for addnl s/s  Px phenergan 25 mg tid prn (caution of sedation) and also skelaxin 400-800 tid prn to help headache with nausea and neck spasm  Update if not starting to improve in a week or if worsening   If this becomes more recurrent, may need to discuss preventative px      Relevant Medications   metaxalone (SKELAXIN) 400 MG tablet       Follow Up Instructions: Use ice on right temple  Rest/ drink fluids A little caffeine may help  Try phenergan for nausea and headache  Skelaxin for neck spasm and headache  Watch for sedation with these medications  Update if not starting to improve in a week or if worsening  If severe please go to ER    I discussed the assessment and treatment plan with the patient. The patient was provided an opportunity to ask questions and all were answered. The patient agreed with the plan and demonstrated an understanding of the instructions.   The patient was advised to call back or seek an in-person evaluation if the symptoms worsen or if the condition fails to improve as anticipated.     Loura Pardon, MD

## 2020-04-08 NOTE — Telephone Encounter (Signed)
Pt said starting on 04/07/20 pt has pulsating H/A on rt side of head that goes to rt eye; no vision changes and no dizziness. Pt has hx of migraine; now pain level is 8. Pt is nauseated but has not vomited.pt had similar symptoms last Fri and Sat and pt questions the weather being a factor. The Ibuprofen 800 mg and excedrin migraine is not helping this time. Pt has allergy to imitrex. Dr Glori Bickers oked adding pt on as video visit 04/08/20 at 12:30. UC precautions given and pt voiced understanding.

## 2020-04-08 NOTE — Assessment & Plan Note (Signed)
Migraine for over a day- not relieved by standard nsaid tx  Disc habits and caffeine /water intake  Disc role of neck muscles Adv to hydrate/ try a caff beverage/ use ice on painful area  Watch for addnl s/s  Px phenergan 25 mg tid prn (caution of sedation) and also skelaxin 400-800 tid prn to help headache with nausea and neck spasm  Update if not starting to improve in a week or if worsening   If this becomes more recurrent, may need to discuss preventative px

## 2020-05-10 DIAGNOSIS — F419 Anxiety disorder, unspecified: Secondary | ICD-10-CM

## 2020-05-10 DIAGNOSIS — F32A Depression, unspecified: Secondary | ICD-10-CM

## 2020-05-12 ENCOUNTER — Encounter: Payer: Self-pay | Admitting: Family Medicine

## 2020-05-12 ENCOUNTER — Ambulatory Visit (INDEPENDENT_AMBULATORY_CARE_PROVIDER_SITE_OTHER): Payer: 59 | Admitting: Family Medicine

## 2020-05-12 VITALS — BP 124/86 | HR 77 | Temp 97.6°F | Ht 69.0 in | Wt 245.4 lb

## 2020-05-12 DIAGNOSIS — M778 Other enthesopathies, not elsewhere classified: Secondary | ICD-10-CM | POA: Diagnosis not present

## 2020-05-12 NOTE — Progress Notes (Signed)
Eldrige Pitkin T. Mairin Lindsley, MD, [place]  [facility] and [place] at [facility] [place], [address]  Phone: [phone]  FAX: [phone]  Autumn [name] - 34 y.o. female  MRN [id number]  Date of Birth: 05-16-1986  Date: 05/12/2020  PCP: Pleas Koch, NP  Referral: Pleas Koch, NP  Chief Complaint  Patient presents with  . Hand Pain    Left    This visit occurred during the SARS-CoV-2 public health emergency.  Safety protocols were in place, including screening questions prior to the visit, additional usage of staff PPE, and extensive cleaning of exam room while observing appropriate contact time as indicated for disinfecting solutions.   Subjective:   Autumn [name] is a 34 y.o. very pleasant female patient with Body mass index is 36.24 kg/m. who presents with the following:  Ongoing left-sided hand pain:  Woke up this morning and her thumb has hurt really bad.  No known injury that she can recall.  Just at the L thumb.  There is no pain in the joint.  No pain throughout the radius or ulna.  No pain otherwise in the hand.  Some pain with radial deviation.  All of this happened acutely in the morning after waking from sleep.  She does have some mild numbness as well in the thumb.  Review of Systems is noted in the HPI, as appropriate   Objective:   BP 124/86   Pulse 77   Temp 97.6 F (36.4 C) (Temporal)   Ht 5\' 9"  (1.753 m)   Wt 245 lb 6 oz (111.3 kg)   LMP 04/28/2020   SpO2 96%   BMI 36.24 kg/m   Nontender with all phalanges, metacarpals.  Nontender at the distal radius and ulna, nontender throughout the radius and ulna nontender at the elbow and full elbow motion.  Finkelstein's is negative. She does have pain with stressing the second dorsal compartment.  Nontender in the wrist joint and nontender at the scaphoid, hook of the hamate.  There is no redness or  warmth.  Radiology: No results found.  Assessment and Plan:     ICD-10-CM   1. Wrist tendonitis  M77.8    Second dorsal compartment.  Suspicion that this did occur from malpositioning during sleep, and certainly could stress under this way or mild swelling could also impaired nerve function.  I suspect that this will do well.  Placed the patient in a thumb spica splint.  Patient Instructions  Voltaren 1% gel. Over the counter You can apply up to 4 times a day Minimal is absorbed in the bloodstream Cost is about 9 dollars    No orders of the defined types were placed in this encounter.  Medications Discontinued During This Encounter  Medication Reason  . metaxalone (SKELAXIN) 400 MG tablet Completed Course  . promethazine (PHENERGAN) 25 MG tablet Completed Course  . propranolol (INDERAL) 10 MG tablet Completed Course   No orders of the defined types were placed in this encounter.   Follow-up: No follow-ups on file.  Signed,  Maud Deed. Temitope Griffing, MD   Outpatient Encounter Medications as of 05/12/2020  Medication Sig  . ALAYCEN 1/35 tablet TAKE 1 TABLET BY MOUTH DAILY.  Marland Kitchen aspirin-acetaminophen-caffeine (EXCEDRIN MIGRAINE) 250-250-65 MG tablet Take by mouth every 6 (six) hours as needed for headache.  . escitalopram (LEXAPRO) 10 MG tablet Take 1 tablet (10 mg total) by mouth daily.  Marland Kitchen ibuprofen (  ADVIL) 200 MG tablet Take 200 mg by mouth every 6 (six) hours as needed.  . valACYclovir (VALTREX) 1000 MG tablet   . [DISCONTINUED] metaxalone (SKELAXIN) 400 MG tablet Take 1-2 tablets (400-800 mg total) by mouth 3 (three) times daily as needed. Headache and neck spasm  . [DISCONTINUED] promethazine (PHENERGAN) 25 MG tablet Take 1 tablet (25 mg total) by mouth every 8 (eight) hours as needed for nausea or vomiting (migraine).  . [DISCONTINUED] propranolol (INDERAL) 10 MG tablet Take 1 tablet (10 mg total) by mouth 2 (two) times daily as needed. For anxiety attacks (Patient not  taking: Reported on 04/08/2020)   No facility-administered encounter medications on file as of 05/12/2020.

## 2020-05-12 NOTE — Patient Instructions (Signed)
Voltaren 1% gel. Over the counter You can apply up to 4 times a day Minimal is absorbed in the bloodstream Cost is about 9 dollars 

## 2020-05-18 ENCOUNTER — Telehealth: Payer: 59 | Admitting: Psychiatry

## 2020-05-26 ENCOUNTER — Telehealth: Payer: Self-pay

## 2020-05-26 ENCOUNTER — Other Ambulatory Visit: Payer: Self-pay | Admitting: Psychiatry

## 2020-05-26 ENCOUNTER — Ambulatory Visit (INDEPENDENT_AMBULATORY_CARE_PROVIDER_SITE_OTHER): Payer: 59 | Admitting: Psychology

## 2020-05-26 DIAGNOSIS — F419 Anxiety disorder, unspecified: Secondary | ICD-10-CM

## 2020-05-26 DIAGNOSIS — F411 Generalized anxiety disorder: Secondary | ICD-10-CM | POA: Diagnosis not present

## 2020-05-26 DIAGNOSIS — F3342 Major depressive disorder, recurrent, in full remission: Secondary | ICD-10-CM

## 2020-05-26 MED ORDER — PROPRANOLOL HCL 10 MG PO TABS: 10.0000 mg | ORAL_TABLET | Freq: Two times a day (BID) | ORAL | 1 refills | Status: DC | PRN

## 2020-05-26 MED ORDER — ESCITALOPRAM OXALATE 10 MG PO TABS: 15.0000 mg | ORAL_TABLET | Freq: Every day | ORAL | 1 refills | Status: DC

## 2020-05-26 NOTE — Telephone Encounter (Signed)
Returned call to patient.  Patient reports she has noticed a lot of anxiety symptoms, racing heart rate often, going into a panic mode.  She started psychotherapy sessions recently and agrees to keep the therapy appointments.  She is agreeable to increasing the Lexapro to 15 mg p.o. daily.  She has been noncompliant on the propranolol and agrees to use it as needed for anxiety attacks.

## 2020-05-26 NOTE — Telephone Encounter (Signed)
Medication problem - Patient left a message stating she has had increased anxiety since cutting down on Lexapro to 10 mg and questions if she should go back up to 15 mg or 20 mg a day. States if she needs 15 mg then she will need a new order.  Patient's next appointment is set for 06/25/19 as she was rescheduled from 06/03/20 and 05/18/20.

## 2020-06-01 ENCOUNTER — Ambulatory Visit: Payer: 59 | Admitting: Family Medicine

## 2020-06-03 ENCOUNTER — Telehealth: Payer: 59 | Admitting: Psychiatry

## 2020-06-08 ENCOUNTER — Ambulatory Visit: Payer: 59 | Admitting: Psychology

## 2020-06-11 DIAGNOSIS — U071 COVID-19: Secondary | ICD-10-CM

## 2020-06-11 DIAGNOSIS — I639 Cerebral infarction, unspecified: Secondary | ICD-10-CM

## 2020-06-11 HISTORY — DX: COVID-19: U07.1

## 2020-06-11 HISTORY — DX: Cerebral infarction, unspecified: I63.9

## 2020-06-13 ENCOUNTER — Other Ambulatory Visit: Payer: 59 | Admitting: Urology

## 2020-06-21 ENCOUNTER — Emergency Department: Payer: 59

## 2020-06-21 ENCOUNTER — Other Ambulatory Visit: Payer: Self-pay

## 2020-06-21 ENCOUNTER — Encounter: Payer: Self-pay | Admitting: Emergency Medicine

## 2020-06-21 ENCOUNTER — Emergency Department
Admission: EM | Admit: 2020-06-21 | Discharge: 2020-06-21 | Disposition: A | Discharge status: Home / Self Care | Payer: 59 | Attending: Emergency Medicine | Admitting: Emergency Medicine

## 2020-06-21 ENCOUNTER — Telehealth: Payer: Self-pay

## 2020-06-21 DIAGNOSIS — R079 Chest pain, unspecified: Secondary | ICD-10-CM | POA: Diagnosis not present

## 2020-06-21 DIAGNOSIS — I2694 Multiple subsegmental pulmonary emboli without acute cor pulmonale: Secondary | ICD-10-CM | POA: Diagnosis not present

## 2020-06-21 DIAGNOSIS — R52 Pain, unspecified: Secondary | ICD-10-CM | POA: Diagnosis not present

## 2020-06-21 DIAGNOSIS — R0789 Other chest pain: Secondary | ICD-10-CM | POA: Diagnosis not present

## 2020-06-21 DIAGNOSIS — I2693 Single subsegmental pulmonary embolism without acute cor pulmonale: Secondary | ICD-10-CM | POA: Diagnosis not present

## 2020-06-21 DIAGNOSIS — R1012 Left upper quadrant pain: Secondary | ICD-10-CM

## 2020-06-21 DIAGNOSIS — R1084 Generalized abdominal pain: Secondary | ICD-10-CM | POA: Diagnosis not present

## 2020-06-21 DIAGNOSIS — Z87891 Personal history of nicotine dependence: Secondary | ICD-10-CM | POA: Insufficient documentation

## 2020-06-21 DIAGNOSIS — Z79899 Other long term (current) drug therapy: Secondary | ICD-10-CM | POA: Diagnosis not present

## 2020-06-21 DIAGNOSIS — R5381 Other malaise: Secondary | ICD-10-CM | POA: Diagnosis not present

## 2020-06-21 DIAGNOSIS — R071 Chest pain on breathing: Secondary | ICD-10-CM

## 2020-06-21 DIAGNOSIS — U071 COVID-19: Secondary | ICD-10-CM

## 2020-06-21 LAB — CBC WITH DIFFERENTIAL/PLATELET
Abs Immature Granulocytes: 0.03 10*3/uL (ref 0.00–0.07)
Basophils Absolute: 0.1 10*3/uL (ref 0.0–0.1)
Basophils Relative: 1 %
Eosinophils Absolute: 0.3 10*3/uL (ref 0.0–0.5)
Eosinophils Relative: 3 %
HCT: 40.3 % (ref 36.0–46.0)
Hemoglobin: 13.9 g/dL (ref 12.0–15.0)
Immature Granulocytes: 0 %
Lymphocytes Relative: 31 %
Lymphs Abs: 2.8 10*3/uL (ref 0.7–4.0)
MCH: 30.3 pg (ref 26.0–34.0)
MCHC: 34.5 g/dL (ref 30.0–36.0)
MCV: 87.8 fL (ref 80.0–100.0)
Monocytes Absolute: 0.6 10*3/uL (ref 0.1–1.0)
Monocytes Relative: 7 %
Neutro Abs: 5.3 10*3/uL (ref 1.7–7.7)
Neutrophils Relative %: 58 %
Platelets: 305 10*3/uL (ref 150–400)
RBC: 4.59 MIL/uL (ref 3.87–5.11)
RDW: 12 % (ref 11.5–15.5)
WBC: 9 10*3/uL (ref 4.0–10.5)
nRBC: 0 % (ref 0.0–0.2)

## 2020-06-21 LAB — COMPREHENSIVE METABOLIC PANEL
ALT: 23 U/L (ref 0–44)
AST: 21 U/L (ref 15–41)
Albumin: 3.9 g/dL (ref 3.5–5.0)
Alkaline Phosphatase: 48 U/L (ref 38–126)
Anion gap: 13 (ref 5–15)
BUN: 13 mg/dL (ref 6–20)
CO2: 20 mmol/L — ABNORMAL LOW (ref 22–32)
Calcium: 9.2 mg/dL (ref 8.9–10.3)
Chloride: 105 mmol/L (ref 98–111)
Creatinine, Ser: 0.75 mg/dL (ref 0.44–1.00)
GFR, Estimated: >60 mL/min (ref >60)
Glucose, Bld: 101 mg/dL — ABNORMAL HIGH (ref 70–99)
Potassium: 4.3 mmol/L (ref 3.5–5.1)
Sodium: 138 mmol/L (ref 135–145)
Total Bilirubin: 0.7 mg/dL (ref 0.3–1.2)
Total Protein: 7.6 g/dL (ref 6.5–8.1)

## 2020-06-21 LAB — TROPONIN I (HIGH SENSITIVITY): Troponin I (High Sensitivity): <2 ng/L (ref <18)

## 2020-06-21 MED ORDER — SUCRALFATE 1 G PO TABS: 1.0000 g | ORAL_TABLET | Freq: Four times a day (QID) | ORAL | 0 refills | Status: DC

## 2020-06-21 MED ORDER — SUCRALFATE 1 G PO TABS
1.0000 g | ORAL_TABLET | Freq: Once | ORAL | Status: AC
  Administered 2020-06-21: 1 g via ORAL
  Filled 2020-06-21: qty 1

## 2020-06-21 MED ORDER — PANTOPRAZOLE SODIUM 40 MG PO TBEC: 40.0000 mg | DELAYED_RELEASE_TABLET | Freq: Every day | ORAL | 0 refills | Status: DC

## 2020-06-21 MED ORDER — ALUM & MAG HYDROXIDE-SIMETH 200-200-20 MG/5ML PO SUSP
30.0000 mL | Freq: Once | ORAL | Status: AC
  Administered 2020-06-21: 30 mL via ORAL
  Filled 2020-06-21: qty 30

## 2020-06-21 NOTE — ED Provider Notes (Signed)
[facility] Emergency Department Provider Note  ____________________________________________   Event Date/Time   First MD Initiated Contact with Patient 06/21/20 [phone]     (approximate)  I have reviewed the triage vital signs and the nursing notes.   HISTORY  Chief Complaint Chest Pain   HPI Autumn [name] is a 35 y.o. female with a past medical history of migraines, depression, chronic back pain, scoliosis, anxiety and recent COVID-19 diagnosis on 12/28 presents for assessment of some left upper quadrant abdominal pain rating to her left shoulder and left arm that began after she took 800 mg of ibuprofen for headache she had last night that she describes is very typical of her usual migraine headaches.  She states her headache is now resolved and her abdominal pain is much improved from when it began.  No prior similar episodes.  No clearly today contributing factors.  Patient denies any fevers, chills, sore throat, lower abdominal pain, back pain, diarrhea, vomiting, dysuria, rash or extremity pain.  She notes she has a slight residual cough from COVID diagnosis but this has also been improving over the last couple of days.  Denies any current tobacco abuse, daily EtOH use or daily NSAID use.         Past Medical History:  Diagnosis Date  . Anxiety   . Back pain    chronic  . Depression   . Disc disease, degenerative, lumbar or lumbosacral   . History of ovarian cyst   . Migraine   . Scoliosis   . Shingles 06/20/2018    Patient Active Problem List   Diagnosis Date Noted  . Migraine 04/08/2020  . MDD (major depressive disorder), recurrent, in full remission ([place]) 02/10/2020  . Anxiety disorder 02/10/2020  . Left carpal tunnel syndrome 01/12/2020  . Left upper quadrant abdominal pain 10/23/2019  . Paresthesia 06/30/2019  . Post-infection bronchospasm 03/27/2019  . Cough 03/04/2019  . Chronic neck and back pain 04/16/2018  . Nonallopathic  lesion of cervical region 04/16/2018  . Nonallopathic lesion of rib cage 04/16/2018  . Nonallopathic lesion of thoracic region 04/16/2018  . Contraception management 08/30/2016  . Family history of breast cancer in mother 08/30/2016  . Well woman exam with routine gynecological exam 08/19/2014  . Obesity (BMI 30.0-34.9) 08/19/2014  . Amenorrhea 12/25/2013  . Anxiety and depression 07/15/2012  . Herpes simplex virus (HSV) infection 11/01/2009    Past Surgical History:  Procedure Laterality Date  . APPENDECTOMY    . BREAST SURGERY Right 2008   lump/ benign    Prior to Admission medications   Medication Sig Start Date End Date Taking? Authorizing Provider  pantoprazole (PROTONIX) 40 MG tablet Take 1 tablet (40 mg total) by mouth daily. 06/21/20 07/21/20 Yes Lucrezia Starch, MD  sucralfate (CARAFATE) 1 g tablet Take 1 tablet (1 g total) by mouth 4 (four) times daily for 5 days. 06/21/20 06/26/20 Yes Lucrezia Starch, MD  ALAYCEN 1/35 tablet TAKE 1 TABLET BY MOUTH DAILY. 11/11/19   Pleas Koch, NP  aspirin-acetaminophen-caffeine (EXCEDRIN MIGRAINE) [phone] MG tablet Take by mouth every 6 (six) hours as needed for headache.    [provider]  escitalopram (LEXAPRO) 10 MG tablet Take 1.5 tablets (15 mg total) by mouth daily. 05/26/20   Ursula Alert, MD  ibuprofen (ADVIL) 200 MG tablet Take 200 mg by mouth every 6 (six) hours as needed.    [provider]  propranolol (INDERAL) 10 MG tablet Take 1 tablet (10  mg total) by mouth 2 (two) times daily as needed. For anxiety 05/26/20   Ursula Alert, MD  valACYclovir (VALTREX) 1000 MG tablet  03/02/19   [provider]    Allergies Imitrex [sumatriptan]  Family History  Problem Relation Age of Onset  . Cancer Mother 82       breast  . Breast cancer Mother 59       and 20  . Alcohol abuse Father   . Vision loss Maternal Grandmother   . Heart disease Maternal Grandmother   . Heart failure Maternal  Grandmother   . Cancer Maternal Grandfather   . Alcohol abuse Paternal Grandfather   . Arthritis Maternal Aunt   . Anxiety disorder Maternal Aunt   . Depression Maternal Aunt     Social History Social History   Tobacco Use  . Smoking status: Former Smoker    Types: Cigarettes    Quit date: 08/18/2016    Years since quitting: 3.8  . Smokeless tobacco: Never Used  . Tobacco comment: 3 cigarettes a day  Vaping Use  . Vaping Use: Never used  Substance Use Topics  . Alcohol use: Yes    Alcohol/week: 0.0 standard drinks    Comment: occasional  . Drug use: No    Review of Systems  Review of Systems  Constitutional: Negative for chills and fever.  HENT: Negative for sore throat.   Eyes: Negative for pain.  Respiratory: Negative for cough and stridor.   Cardiovascular: Positive for chest pain.  Gastrointestinal: Positive for abdominal pain and nausea. Negative for vomiting.  Genitourinary: Negative for dysuria.  Musculoskeletal: Negative for myalgias.  Skin: Negative for rash.  Neurological: Negative for seizures, loss of consciousness and headaches.  Psychiatric/Behavioral: Negative for suicidal ideas.  All other systems reviewed and are negative.     ____________________________________________   PHYSICAL EXAM:  VITAL SIGNS: ED Triage Vitals  Enc Vitals Group     BP 06/21/20 0654 131/82     Pulse Rate 06/21/20 0654 84     Resp 06/21/20 0654 18     Temp 06/21/20 0654 98.1 F (36.7 C)     Temp Source 06/21/20 0654 Oral     SpO2 06/21/20 0654 98 %     Weight 06/21/20 0651 245 lb (111.1 kg)     Height 06/21/20 0651 5\' 10"  (1.778 m)     Head Circumference --      Peak Flow --      Pain Score 06/21/20 0651 9     Pain Loc --      Pain Edu? --      Excl. in [place]? --    Vitals:   06/21/20 0654 06/21/20 0806  BP: 131/82 119/87  Pulse: 84 75  Resp: 18 18  Temp: 98.1 F (36.7 C)   SpO2: 98% 97%   Physical Exam Vitals and nursing note reviewed.   Constitutional:      General: She is not in acute distress.    Appearance: She is well-developed and well-nourished.  HENT:     Head: Normocephalic and atraumatic.     Right Ear: External ear normal.     Left Ear: External ear normal.     Nose: Nose normal.  Eyes:     Conjunctiva/sclera: Conjunctivae normal.  Cardiovascular:     Rate and Rhythm: Normal rate and regular rhythm.     Pulses: Normal pulses.     Heart sounds: No murmur heard.   Pulmonary:  Effort: Pulmonary effort is normal. No respiratory distress.     Breath sounds: Normal breath sounds.  Abdominal:     Palpations: Abdomen is soft.     Tenderness: There is no abdominal tenderness.  Musculoskeletal:        General: No edema.     Cervical back: Neck supple.     Right lower leg: No edema.     Left lower leg: No edema.  Skin:    General: Skin is warm and dry.     Capillary Refill: Capillary refill takes less than 2 seconds.  Neurological:     Mental Status: She is alert and oriented to person, place, and time.  Psychiatric:        Mood and Affect: Mood and affect and mood normal.      ____________________________________________   LABS (all labs ordered are listed, but only abnormal results are displayed)  Labs Reviewed  COMPREHENSIVE METABOLIC PANEL - Abnormal; Notable for the following components:      Result Value   CO2 20 (*)    Glucose, Bld 101 (*)    All other components within normal limits  CBC WITH DIFFERENTIAL/PLATELET  TROPONIN I (HIGH SENSITIVITY)  TROPONIN I (HIGH SENSITIVITY)   ____________________________________________  EKG  Sinus rhythm with a ventricular rate of 85, normal axis, intervals, no evidence of acute ischemia. ____________________________________________  RADIOLOGY  ED MD interpretation: No pneumothorax, large effusion, overt edema, or significant focal consolidation although there is a very small infiltrate over the left lower lobe.  Official radiology  report(s): DG Chest 2 View  Result Date: 06/21/2020 CLINICAL DATA:  Chest pain. EXAM: CHEST - 2 VIEW COMPARISON:  03/03/2019. FINDINGS: Mediastinum and hilar structures normal. Heart size normal. Low lung volumes. A minimal infiltrate projected over the lower lobes on lateral view cannot be completely excluded. No pleural effusion. No pneumothorax. Mild thoracic spine scoliosis. No acute bony abnormality. IMPRESSION: It minimal infiltrate projected over the lower lobes on lateral view cannot be completely excluded. Electronically Signed   By: Marcello Moores  Register   On: 06/21/2020 07:28    ____________________________________________   PROCEDURES  Procedure(s) performed (including Critical Care):  Procedures   ____________________________________________   INITIAL IMPRESSION / ASSESSMENT AND PLAN / ED COURSE      Patient presents for assessment of some left upper quadrant abdominal pain.  To the left shoulder and chest that began after she took a new milligrams of ibuprofen for headache last night.  Her headache has since resolved and her upper abdominal pain and chest pain have significantly improved since they began patient is afebrile and hemodynamically stable on arrival.  Differential includes but is not limited to ACS, PE, pneumonia, pneumothorax, dissection, peptic ulcer disease, and GERD/esophagitis.  Low suspicion for ACS given reassuring EKG and nonelevated troponin pain greater and 3 hours after symptom onset.  ECG, troponin and history are not consistent with myocarditis.  Chest x-ray shows no evidence of pneumothorax I suspect a very small infiltrate in left lower lung is resolving COVID-pneumonia given history of improving cough medical suspicion for acute bacterial pneumonia at this time given absence of fever or elevated white blood cell count.  CBC is otherwise unremarkable.  CMP shows no significant atrial or metabolic derangements.  No evidence of cholestasis or hepatitis.   No epigastric tenderness or clear risk factors for acute pancreatitis.  Low suspicion for pancreatitis at this time.  Patient given Maalox and super fate with concern for possible peptic ulcer disease versus gastritis  secondary to NSAID use given these symptoms all seem to have begun after patient took 800 mg ibuprofen and medically improved over the last 6 hours.  Rx written for Protonix and a couple days of sucralfate.  Advised patient to refrain from further use of ibuprofen.  Discharged stable condition.  Strict precautions advised.  Advised patient follow-up with her PCP for any persistent symptoms in the next 5 to 7 days    ____________________________________________   FINAL CLINICAL IMPRESSION(S) / ED DIAGNOSES  Final diagnoses:  Chest pain, unspecified type  LUQ pain    Medications  alum & mag hydroxide-simeth (MAALOX/MYLANTA) 200-200-20 MG/5ML suspension 30 mL (30 mLs Oral Given 06/21/20 0824)  sucralfate (CARAFATE) tablet 1 g (1 g Oral Given 06/21/20 0825)     ED Discharge Orders         Ordered    pantoprazole (PROTONIX) 40 MG tablet  Daily        06/21/20 0823    sucralfate (CARAFATE) 1 g tablet  4 times daily        06/21/20 2395           Note:  This document was prepared using Dragon voice recognition software and may include unintentional dictation errors.   Lucrezia Starch, MD 06/21/20 [phone]

## 2020-06-21 NOTE — ED Triage Notes (Addendum)
Patient ambulatory to triage with steady gait, without difficulty or distress noted; pt reports left sided CP radiating into neck & shoulder; +COVID 12/28

## 2020-06-21 NOTE — Telephone Encounter (Signed)
Reviewed patient's visit from ED including labs, imaging, notes. Recommend we obtain a d-dimer given patient's symptoms and recent diagnosis of Covid-19. She could very well have pleurisy given Covid infection. We discussed this too. If d-dimer is not convincing then will treat for pleurisy.    She will come in tomorrow for lab and evaluation.

## 2020-06-21 NOTE — Telephone Encounter (Signed)
Patient calls in to report ongoing chest pain that started 5am this morning with difficulty taking a deep breath.  EMS was called but because EKG and vital signs (other than mildly elevated bp) was normal, they decided to drive to ER rather than go by EMS.  Pain stated as migraine at 3am before progressing to chest at 5am.   She was evaluated in ER with EKG, CHEST XRAY, labs and physician did note lung changes from hx of smoking and acute changes from recovering from COVID pneumonia.  Physician dx that the pain she is having was from referred pain from her stomach from taking a lot of 800mg  ibuprofen in recent weeks.   Patient is very concerned and anxious.  Crying on the phone due to being concerned.  She continues to rate her pain 8/9 in the left chest/shoulder area that shoots through her back continuing to make it difficult to breathe.  She does not know what to do about the pain.   She is just very concerned something further is going on and she doesn't know what to do.  meds given at ER were carafate and maalox for stomach but she is not having stomach discomfort.    Can Anda Kraft contact patient to discuss next steps or should we set up for in office visit tomorrow for further evaluation?    Allie Bossier, NP please advise.

## 2020-06-22 ENCOUNTER — Encounter: Payer: Self-pay | Admitting: Emergency Medicine

## 2020-06-22 ENCOUNTER — Observation Stay
Admission: EM | Admit: 2020-06-22 | Discharge: 2020-06-25 | Disposition: A | Discharge status: Home / Self Care | Payer: 59 | Attending: Internal Medicine | Admitting: Internal Medicine

## 2020-06-22 ENCOUNTER — Observation Stay: Payer: 59

## 2020-06-22 ENCOUNTER — Other Ambulatory Visit: Payer: Self-pay

## 2020-06-22 ENCOUNTER — Emergency Department: Payer: 59

## 2020-06-22 DIAGNOSIS — Z7982 Long term (current) use of aspirin: Secondary | ICD-10-CM | POA: Diagnosis not present

## 2020-06-22 DIAGNOSIS — Z20822 Contact with and (suspected) exposure to covid-19: Secondary | ICD-10-CM | POA: Insufficient documentation

## 2020-06-22 DIAGNOSIS — R52 Pain, unspecified: Secondary | ICD-10-CM | POA: Diagnosis not present

## 2020-06-22 DIAGNOSIS — D72829 Elevated white blood cell count, unspecified: Secondary | ICD-10-CM | POA: Diagnosis not present

## 2020-06-22 DIAGNOSIS — F32A Depression, unspecified: Secondary | ICD-10-CM | POA: Diagnosis not present

## 2020-06-22 DIAGNOSIS — F419 Anxiety disorder, unspecified: Secondary | ICD-10-CM | POA: Diagnosis not present

## 2020-06-22 DIAGNOSIS — Z87891 Personal history of nicotine dependence: Secondary | ICD-10-CM | POA: Insufficient documentation

## 2020-06-22 DIAGNOSIS — K760 Fatty (change of) liver, not elsewhere classified: Secondary | ICD-10-CM | POA: Insufficient documentation

## 2020-06-22 DIAGNOSIS — R1012 Left upper quadrant pain: Secondary | ICD-10-CM | POA: Diagnosis not present

## 2020-06-22 DIAGNOSIS — G43701 Chronic migraine without aura, not intractable, with status migrainosus: Secondary | ICD-10-CM

## 2020-06-22 DIAGNOSIS — E8889 Other specified metabolic disorders: Secondary | ICD-10-CM

## 2020-06-22 DIAGNOSIS — I2694 Multiple subsegmental pulmonary emboli without acute cor pulmonale: Secondary | ICD-10-CM | POA: Diagnosis not present

## 2020-06-22 DIAGNOSIS — I2699 Other pulmonary embolism without acute cor pulmonale: Secondary | ICD-10-CM | POA: Diagnosis present

## 2020-06-22 DIAGNOSIS — R911 Solitary pulmonary nodule: Secondary | ICD-10-CM | POA: Diagnosis not present

## 2020-06-22 DIAGNOSIS — E7889 Other lipoprotein metabolism disorders: Secondary | ICD-10-CM

## 2020-06-22 DIAGNOSIS — I7090 Unspecified atherosclerosis: Secondary | ICD-10-CM | POA: Diagnosis not present

## 2020-06-22 DIAGNOSIS — R079 Chest pain, unspecified: Secondary | ICD-10-CM | POA: Diagnosis not present

## 2020-06-22 DIAGNOSIS — R0602 Shortness of breath: Secondary | ICD-10-CM | POA: Diagnosis not present

## 2020-06-22 DIAGNOSIS — F329 Major depressive disorder, single episode, unspecified: Secondary | ICD-10-CM | POA: Diagnosis not present

## 2020-06-22 DIAGNOSIS — G43909 Migraine, unspecified, not intractable, without status migrainosus: Secondary | ICD-10-CM | POA: Diagnosis present

## 2020-06-22 DIAGNOSIS — R0789 Other chest pain: Secondary | ICD-10-CM | POA: Diagnosis not present

## 2020-06-22 DIAGNOSIS — I10 Essential (primary) hypertension: Secondary | ICD-10-CM | POA: Diagnosis not present

## 2020-06-22 DIAGNOSIS — F418 Other specified anxiety disorders: Secondary | ICD-10-CM | POA: Diagnosis present

## 2020-06-22 DIAGNOSIS — I2693 Single subsegmental pulmonary embolism without acute cor pulmonale: Secondary | ICD-10-CM | POA: Diagnosis not present

## 2020-06-22 DIAGNOSIS — J9811 Atelectasis: Secondary | ICD-10-CM | POA: Diagnosis not present

## 2020-06-22 DIAGNOSIS — R109 Unspecified abdominal pain: Secondary | ICD-10-CM | POA: Diagnosis not present

## 2020-06-22 DIAGNOSIS — R0902 Hypoxemia: Secondary | ICD-10-CM | POA: Diagnosis not present

## 2020-06-22 HISTORY — DX: Solitary pulmonary nodule: R91.1

## 2020-06-22 LAB — CBC
HCT: 40 % (ref 36.0–46.0)
Hemoglobin: 13.9 g/dL (ref 12.0–15.0)
MCH: 30.9 pg (ref 26.0–34.0)
MCHC: 34.8 g/dL (ref 30.0–36.0)
MCV: 88.9 fL (ref 80.0–100.0)
Platelets: 306 10*3/uL (ref 150–400)
RBC: 4.5 MIL/uL (ref 3.87–5.11)
RDW: 12.2 % (ref 11.5–15.5)
WBC: 12.9 10*3/uL — ABNORMAL HIGH (ref 4.0–10.5)
nRBC: 0 % (ref 0.0–0.2)

## 2020-06-22 LAB — COMPREHENSIVE METABOLIC PANEL
ALT: 25 U/L (ref 0–44)
AST: 19 U/L (ref 15–41)
Albumin: 3.7 g/dL (ref 3.5–5.0)
Alkaline Phosphatase: 52 U/L (ref 38–126)
Anion gap: 12 (ref 5–15)
BUN: 13 mg/dL (ref 6–20)
CO2: 23 mmol/L (ref 22–32)
Calcium: 9.2 mg/dL (ref 8.9–10.3)
Chloride: 105 mmol/L (ref 98–111)
Creatinine, Ser: 0.75 mg/dL (ref 0.44–1.00)
GFR, Estimated: >60 mL/min (ref >60)
Glucose, Bld: 111 mg/dL — ABNORMAL HIGH (ref 70–99)
Potassium: 3.9 mmol/L (ref 3.5–5.1)
Sodium: 140 mmol/L (ref 135–145)
Total Bilirubin: 0.7 mg/dL (ref 0.3–1.2)
Total Protein: 7.3 g/dL (ref 6.5–8.1)

## 2020-06-22 LAB — HIV ANTIBODY (ROUTINE TESTING W REFLEX): HIV Screen 4th Generation wRfx: NONREACTIVE

## 2020-06-22 LAB — URINALYSIS, ROUTINE W REFLEX MICROSCOPIC
Bilirubin Urine: NEGATIVE
Glucose, UA: NEGATIVE mg/dL
Ketones, ur: NEGATIVE mg/dL
Nitrite: NEGATIVE
Protein, ur: NEGATIVE mg/dL
Specific Gravity, Urine: 1.024 (ref 1.005–1.030)
pH: 5 (ref 5.0–8.0)

## 2020-06-22 LAB — HEPARIN LEVEL (UNFRACTIONATED): Heparin Unfractionated: 0.37 IU/mL (ref 0.30–0.70)

## 2020-06-22 LAB — ANTITHROMBIN III: AntiThromb III Func: 100 % (ref 75–120)

## 2020-06-22 LAB — PROCALCITONIN: Procalcitonin: <0.1 ng/mL

## 2020-06-22 LAB — APTT: aPTT: 25 seconds (ref 24–36)

## 2020-06-22 LAB — HCG, QUANTITATIVE, PREGNANCY: hCG, Beta Chain, Quant, S: 4 m[IU]/mL (ref <5)

## 2020-06-22 LAB — PROTIME-INR
INR: 0.9 (ref 0.8–1.2)
Prothrombin Time: 12 seconds (ref 11.4–15.2)

## 2020-06-22 LAB — POC URINE PREG, ED: Preg Test, Ur: NEGATIVE

## 2020-06-22 LAB — SARS CORONAVIRUS 2 (TAT 6-24 HRS): SARS Coronavirus 2: NEGATIVE

## 2020-06-22 LAB — TROPONIN I (HIGH SENSITIVITY): Troponin I (High Sensitivity): 2 ng/L (ref <18)

## 2020-06-22 LAB — BRAIN NATRIURETIC PEPTIDE: B Natriuretic Peptide: 20.4 pg/mL (ref 0.0–100.0)

## 2020-06-22 MED ORDER — HEPARIN BOLUS VIA INFUSION
6000.0000 [IU] | Freq: Once | INTRAVENOUS | Status: AC
  Administered 2020-06-22: 6000 [IU] via INTRAVENOUS
  Filled 2020-06-22: qty 6000

## 2020-06-22 MED ORDER — OXYCODONE-ACETAMINOPHEN 5-325 MG PO TABS
1.0000 | ORAL_TABLET | ORAL | Status: DC | PRN
  Administered 2020-06-22 – 2020-06-23 (×3): 1 via ORAL
  Filled 2020-06-22 (×3): qty 1

## 2020-06-22 MED ORDER — ACETAMINOPHEN 500 MG PO TABS
1000.0000 mg | ORAL_TABLET | Freq: Once | ORAL | Status: DC
  Filled 2020-06-22: qty 2

## 2020-06-22 MED ORDER — ALBUTEROL SULFATE HFA 108 (90 BASE) MCG/ACT IN AERS
2.0000 | INHALATION_SPRAY | RESPIRATORY_TRACT | Status: DC | PRN
  Filled 2020-06-22: qty 6.7

## 2020-06-22 MED ORDER — DM-GUAIFENESIN ER 30-600 MG PO TB12: 1.0000 | ORAL_TABLET | Freq: Two times a day (BID) | ORAL | Status: DC | PRN

## 2020-06-22 MED ORDER — HYDROMORPHONE HCL 1 MG/ML IJ SOLN
1.0000 mg | Freq: Once | INTRAMUSCULAR | Status: AC
  Administered 2020-06-22: 1 mg via INTRAVENOUS
  Filled 2020-06-22: qty 1

## 2020-06-22 MED ORDER — ACETAMINOPHEN 650 MG RE SUPP: 650.0000 mg | Freq: Four times a day (QID) | RECTAL | Status: DC | PRN

## 2020-06-22 MED ORDER — MORPHINE SULFATE (PF) 2 MG/ML IV SOLN
2.0000 mg | INTRAVENOUS | Status: DC | PRN
  Administered 2020-06-22 – 2020-06-24 (×5): 2 mg via INTRAVENOUS
  Filled 2020-06-22 (×5): qty 1

## 2020-06-22 MED ORDER — HEPARIN (PORCINE) 25000 UT/250ML-% IV SOLN
1500.0000 [IU]/h | INTRAVENOUS | Status: DC
  Administered 2020-06-22: 1500 [IU]/h via INTRAVENOUS
  Filled 2020-06-22 (×2): qty 250

## 2020-06-22 MED ORDER — ONDANSETRON HCL 4 MG/2ML IJ SOLN
4.0000 mg | Freq: Three times a day (TID) | INTRAMUSCULAR | Status: DC | PRN
  Administered 2020-06-22: 4 mg via INTRAVENOUS
  Filled 2020-06-22: qty 2

## 2020-06-22 MED ORDER — IOHEXOL 350 MG/ML SOLN
100.0000 mL | Freq: Once | INTRAVENOUS | Status: AC | PRN
  Administered 2020-06-22: 100 mL via INTRAVENOUS

## 2020-06-22 NOTE — ED Notes (Signed)
Pt up to toilet 

## 2020-06-22 NOTE — Consult Note (Signed)
ANTICOAGULATION CONSULT NOTE  Pharmacy Consult for Heparin Infusion Indication: pulmonary embolus  Patient Measurements: Heparin Dosing Weight: 93.3 kg  Labs: Recent Labs    06/21/20 0657 06/22/20 0615 06/22/20 1155 06/22/20 1853  HGB 13.9 13.9  --   --   HCT 40.3 40.0  --   --   PLT 305 306  --   --   APTT  --   --  25  --   LABPROT  --   --  12.0  --   INR  --   --  0.9  --   HEPARINUNFRC  --   --   --  0.37  CREATININE 0.75 0.75  --   --   TROPONINIHS <2 2  --   --     Estimated Creatinine Clearance: 133.7 mL/min (by C-G formula based on SCr of 0.75 mg/dL).   Medical History: Past Medical History:  Diagnosis Date  . Anxiety   . Back pain    chronic  . Depression   . Disc disease, degenerative, lumbar or lumbosacral   . History of ovarian cyst   . Migraine   . Scoliosis   . Shingles 06/20/2018    Medications:  No anticoagulation prior to admission per my chart review  Assessment: Patient is a 35 y/o F with medical history as above who presented to the ED complaining of left flank pain. CTA with findings of Bilateral submental and subsegmental pulmonary emboli. Pharmacy consulted to initiate heparin infusion for PE.  Baseline H&H within normal limits. Baseline aPTT and PT-INR pending.   1/12 1200 INITIAL= 6000 unit IV bolus x 1 followed by continuous infusion at 1500 units/hr 1/12 1853 HL = 0.37 - therapeutic x 1   Goal of Therapy:  Heparin level 0.3-0.7 units/ml Monitor platelets by anticoagulation protocol: Yes   Plan:  --Heparin level on lower end of therapeutic range. Will continue infusion at current rate  --Recheck heparin level in 6 hours to confirm therapeutic range --Daily CBC per protocol while on heparin infusion   Dorothe Pea, PharmD, BCPS Clinical Pharmacist  06/22/2020,7:25 PM

## 2020-06-22 NOTE — ED Notes (Signed)
Pt states that she tested positive for Covid on 06/08/20 with an at home test. No test results are available in Epic at this time. Pt swabbed again and sample sent to lab.

## 2020-06-22 NOTE — Consult Note (Signed)
ANTICOAGULATION CONSULT NOTE  Pharmacy Consult for Heparin Infusion Indication: pulmonary embolus  Patient Measurements: Heparin Dosing Weight: 93.3 kg  Labs: Recent Labs    06/21/20 0657 06/22/20 0615  HGB 13.9 13.9  HCT 40.3 40.0  PLT 305 306  CREATININE 0.75 0.75  TROPONINIHS <2 2    Estimated Creatinine Clearance: 133.7 mL/min (by C-G formula based on SCr of 0.75 mg/dL).   Medical History: Past Medical History:  Diagnosis Date  . Anxiety   . Back pain    chronic  . Depression   . Disc disease, degenerative, lumbar or lumbosacral   . History of ovarian cyst   . Migraine   . Scoliosis   . Shingles 06/20/2018    Medications:  No anticoagulation prior to admission per my chart review  Assessment: Patient is a 35 y/o F with medical history as above who presented to the ED complaining of left flank pain. CTA with findings of Bilateral submental and subsegmental pulmonary emboli. Pharmacy consulted to initiate heparin infusion for PE.  Baseline H&H within normal limits. Baseline aPTT and PT-INR pending.   Goal of Therapy:  Heparin level 0.3-0.7 units/ml Monitor platelets by anticoagulation protocol: Yes   Plan:  --Heparin 6000 unit IV bolus x 1 followed by continuous infusion at 1500 units/hr --Heparin level 6 hours after initiation of infusion --Daily CBC per protocol while on heparin infusion   Benita Gutter 06/22/2020,10:26 AM

## 2020-06-22 NOTE — ED Triage Notes (Signed)
Pt to ED via [facility] from home c/o left flank pain 0500 yesterday.  Pain is worse with inspirations and movement.  Pain radiating to entire left side and into left arm.  EMS gave total 100 fentanyl.  Pt A&Ox4, chest rise even and unlabored, in NAD at this time.  Pt seen here earlier for the same, pain relieved some but came back worse.  Pt A&Ox4, chest rise even and unlabored, skin WNL, in NAD at this time.

## 2020-06-22 NOTE — ED Provider Notes (Signed)
[facility] Emergency Department Provider Note  ____________________________________________   Event Date/Time   First MD Initiated Contact with Patient 06/22/20 0725     (approximate)  I have reviewed the triage vital signs and the nursing notes.   HISTORY  Chief Complaint Flank Pain   HPI Autumn [name] is a 35 y.o. female with a past medical history of anxiety, chronic back pain, depression, migraine, scoliosis and depression as well as GERD who presents back to the emergency room after initially being seen by this provider yesterday for further assessment of left upper quadrant chest pain/left lower anterior chest pain radiating to the back and left shoulder.  Patient states the pain she had yesterday during her ED visit subsided initially but then came back worse overnight.  She states the pain made it feel as if he was short of breath.  She has not had any fevers, chills, cough, nausea, vomiting, diarrhea, dysuria, rash, extremity pain, urinary symptoms, earache, sore throat or any other acute sick symptoms.  No clear leaving [place] factors.  Patient has not taken any ibuprofen or NSAIDs in the interim.  She did speak with her PCP who was concerned about possible PE and recommended a D-dimer which was ordered on outpatient basis but not has been obtained.  Patient did receive 100 mcg of fentanyl from EMS secondary to pain.         Past Medical History:  Diagnosis Date  . Anxiety   . Back pain    chronic  . Depression   . Disc disease, degenerative, lumbar or lumbosacral   . History of ovarian cyst   . Migraine   . Scoliosis   . Shingles 06/20/2018    Patient Active Problem List   Diagnosis Date Noted  . Migraine 04/08/2020  . MDD (major depressive disorder), recurrent, in full remission ([place]) 02/10/2020  . Anxiety disorder 02/10/2020  . Left carpal tunnel syndrome 01/12/2020  . Left upper quadrant abdominal pain 10/23/2019  .  Paresthesia 06/30/2019  . Post-infection bronchospasm 03/27/2019  . Cough 03/04/2019  . Chronic neck and back pain 04/16/2018  . Nonallopathic lesion of cervical region 04/16/2018  . Nonallopathic lesion of rib cage 04/16/2018  . Nonallopathic lesion of thoracic region 04/16/2018  . Contraception management 08/30/2016  . Family history of breast cancer in mother 08/30/2016  . Well woman exam with routine gynecological exam 08/19/2014  . Obesity (BMI 30.0-34.9) 08/19/2014  . Amenorrhea 12/25/2013  . Anxiety and depression 07/15/2012  . Herpes simplex virus (HSV) infection 11/01/2009    Past Surgical History:  Procedure Laterality Date  . APPENDECTOMY    . BREAST SURGERY Right 2008   lump/ benign    Prior to Admission medications   Medication Sig Start Date End Date Taking? Authorizing Provider  ALAYCEN 1/35 tablet TAKE 1 TABLET BY MOUTH DAILY. 11/11/19   Pleas Koch, NP  aspirin-acetaminophen-caffeine (EXCEDRIN MIGRAINE) [phone] MG tablet Take by mouth every 6 (six) hours as needed for headache.    [provider]  escitalopram (LEXAPRO) 10 MG tablet Take 1.5 tablets (15 mg total) by mouth daily. 05/26/20   Ursula Alert, MD  ibuprofen (ADVIL) 200 MG tablet Take 200 mg by mouth every 6 (six) hours as needed.    [provider]  pantoprazole (PROTONIX) 40 MG tablet Take 1 tablet (40 mg total) by mouth daily. 06/21/20 07/21/20  Lucrezia Starch, MD  propranolol (INDERAL) 10 MG tablet Take 1 tablet (10 mg total) by  mouth 2 (two) times daily as needed. For anxiety 05/26/20   Ursula Alert, MD  sucralfate (CARAFATE) 1 g tablet Take 1 tablet (1 g total) by mouth 4 (four) times daily for 5 days. 06/21/20 06/26/20  Lucrezia Starch, MD  valACYclovir (VALTREX) 1000 MG tablet  03/02/19   [provider]    Allergies Imitrex [sumatriptan]  Family History  Problem Relation Age of Onset  . Cancer Mother 66       breast  . Breast cancer Mother 77        and 68  . Alcohol abuse Father   . Vision loss Maternal Grandmother   . Heart disease Maternal Grandmother   . Heart failure Maternal Grandmother   . Cancer Maternal Grandfather   . Alcohol abuse Paternal Grandfather   . Arthritis Maternal Aunt   . Anxiety disorder Maternal Aunt   . Depression Maternal Aunt     Social History Social History   Tobacco Use  . Smoking status: Former Smoker    Types: Cigarettes    Quit date: 08/18/2016    Years since quitting: 3.8  . Smokeless tobacco: Never Used  . Tobacco comment: 3 cigarettes a day  Vaping Use  . Vaping Use: Never used  Substance Use Topics  . Alcohol use: Yes    Alcohol/week: 0.0 standard drinks    Comment: occasional  . Drug use: No    Review of Systems  Review of Systems  Constitutional: Negative for chills and fever.  HENT: Negative for sore throat.   Eyes: Negative for pain.  Respiratory: Positive for shortness of breath. Negative for cough and stridor.   Cardiovascular: Positive for chest pain.  Gastrointestinal: Positive for abdominal pain ( LUQ). Negative for vomiting.  Musculoskeletal: Positive for back pain.  Skin: Negative for rash.  Neurological: Negative for seizures, loss of consciousness and headaches.  Psychiatric/Behavioral: Negative for suicidal ideas.  All other systems reviewed and are negative.     ____________________________________________   PHYSICAL EXAM:  VITAL SIGNS: ED Triage Vitals  Enc Vitals Group     BP 06/22/20 0544 121/84     Pulse Rate 06/22/20 0544 92     Resp 06/22/20 0544 16     Temp 06/22/20 0544 98.2 F (36.8 C)     Temp Source 06/22/20 0544 Oral     SpO2 06/22/20 0544 97 %     Weight 06/22/20 0546 245 lb (111.1 kg)     Height 06/22/20 0546 5\' 10"  (1.778 m)     Head Circumference --      Peak Flow --      Pain Score 06/22/20 0544 5     Pain Loc --      Pain Edu? --      Excl. in [place]? --    Vitals:   06/22/20 0930 06/22/20 0935  BP:  110/65  Pulse: 72 71   Resp: 16 17  Temp:    SpO2: 92% 93%   Physical Exam Vitals and nursing note reviewed.  Constitutional:      General: She is not in acute distress.    Appearance: She is well-developed and well-nourished. She is obese.  HENT:     Head: Normocephalic and atraumatic.     Right Ear: External ear normal.     Left Ear: External ear normal.     Nose: Nose normal.  Eyes:     Conjunctiva/sclera: Conjunctivae normal.  Cardiovascular:     Rate and Rhythm: Normal rate  and regular rhythm.     Pulses: Normal pulses.     Heart sounds: No murmur heard.   Pulmonary:     Effort: Pulmonary effort is normal. No respiratory distress.     Breath sounds: Normal breath sounds.  Abdominal:     Palpations: Abdomen is soft.     Tenderness: There is abdominal tenderness in the left upper quadrant. There is no right CVA tenderness or left CVA tenderness.  Musculoskeletal:        General: No edema.     Cervical back: Neck supple.  Skin:    General: Skin is warm and dry.     Capillary Refill: Capillary refill takes less than 2 seconds.  Neurological:     Mental Status: She is alert and oriented to person, place, and time.  Psychiatric:        Mood and Affect: Mood and affect and mood normal.      ____________________________________________   LABS (all labs ordered are listed, but only abnormal results are displayed)  Labs Reviewed  CBC - Abnormal; Notable for the following components:      Result Value   WBC 12.9 (*)    All other components within normal limits  COMPREHENSIVE METABOLIC PANEL - Abnormal; Notable for the following components:   Glucose, Bld 111 (*)    All other components within normal limits  URINALYSIS, ROUTINE W REFLEX MICROSCOPIC - Abnormal; Notable for the following components:   Color, Urine YELLOW (*)    APPearance CLOUDY (*)    Hgb urine dipstick MODERATE (*)    Leukocytes,Ua LARGE (*)    Bacteria, UA MANY (*)    All other components within normal limits  URINE  CULTURE  SARS CORONAVIRUS 2 (TAT 6-24 HRS)  PROCALCITONIN  HCG, QUANTITATIVE, PREGNANCY  APTT  PROTIME-INR  POC URINE PREG, ED  TROPONIN I (HIGH SENSITIVITY)   ____________________________________________  EKG  Sinus rhythm with a ventricular rate of 85, normal axis, unremarkable intervals, no evidence of acute ischemia or other underlying arrhythmia. ____________________________________________  RADIOLOGY  ED MD interpretation: CT with some atelectasis in the bilateral subsegmental and segmental pulmonary emboli without evidence of volume overload, pneumothorax, rib fracture or acute bacterial pneumonia.  Abdomen pelvis is unremarkable.  Official radiology report(s): CT Angio Chest PE W and/or Wo Contrast  Result Date: 06/22/2020 CLINICAL DATA:  Pain radiating to LEFT side, pain with inspiration and movement. EXAM: CT ANGIOGRAPHY CHEST WITH CONTRAST TECHNIQUE: Multidetector CT imaging of the chest was performed using the standard protocol during bolus administration of intravenous contrast. Multiplanar CT image reconstructions and MIPs were obtained to evaluate the vascular anatomy. CONTRAST:  158mL OMNIPAQUE IOHEXOL 350 MG/ML SOLN COMPARISON:  August 16, 2018 FINDINGS: Cardiovascular: Normal caliber thoracic aorta. The heart size top normal without pericardial effusion. RV to LV ratio of 0.88. Central pulmonary vasculature is normal caliber. Segmental and subsegmental filling defects RIGHT posteromedial lower lobe. Filling posterior segmental distally and extending into subsegmental branches. Images 136 through 148 of series 5. Segmental filling defect in the lateral segment of the LEFT lower lobe as well. Segmental filling defect in the LEFT upper lobe posterior segmental branch, apicoposterior segmental branch. This is occlusive and seen on images 135 through 146 no central filling defect. Filling defect in subsegmental branches in the RIGHT middle lobe involving both medial and lateral  RIGHT middle lobe on images 119 and 128 and image 124 of series 5 Mediastinum/Nodes: No thoracic inlet adenopathy. No axillary lymphadenopathy. No mediastinal lymphadenopathy. Esophagus  grossly normal. No hilar lymphadenopathy. Lungs/Pleura: 5 mm RIGHT middle lobe pulmonary nodule is unchanged. Basilar volume loss and ground-glass attenuation worse in the LEFT lower lobe. Airways are patent. Upper Abdomen: Hepatic steatosis. No pericholecystic stranding. Gallbladder liver incompletely imaged. Spine select spine imaged portions of pancreas, spleen and adrenal glands are normal. No acute gastrointestinal process on very limited assessment of the upper abdomen. Musculoskeletal: No acute or destructive bone finding. Review of the MIP images confirms the above findings. IMPRESSION: 1. Bilateral submental and subsegmental pulmonary emboli. 2. RV to LV ratio at the upper range of normal just below what is considered for RIGHT heart strain. Configuration of the heart and RV to LV ratio is actually unchanged since March of 2020. Attention for any symptoms that would suggest cardiac dysfunction with echocardiography as warranted. 3. Basilar atelectasis with developing pulmonary infarct in the LEFT lower lobe. 4. Stable 5 mm RIGHT middle lobe pulmonary nodule. Stability over greater than 1 years time compatible with benign nodule. 5. Hepatic steatosis. 6. Aortic atherosclerosis. Critical Value/emergent results were called by telephone at the time of interpretation on 06/22/2020 at 10:24 am to provider [facility] , who verbally acknowledged these results. Aortic Atherosclerosis (ICD10-I70.0). Electronically Signed   By: Zetta Bills M.D.   On: 06/22/2020 10:24   CT ABDOMEN PELVIS W CONTRAST  Result Date: 06/22/2020 CLINICAL DATA:  35 year old female with abdominal pain. EXAM: CT ABDOMEN AND PELVIS WITH CONTRAST TECHNIQUE: Multidetector CT imaging of the abdomen and pelvis was performed using the standard protocol  following bolus administration of intravenous contrast. CONTRAST:  122mL OMNIPAQUE IOHEXOL 350 MG/ML SOLN in conjunction with contrast enhanced imaging of the chest reported separately. COMPARISON:  CTA chest today reported separately. FINDINGS: Lower chest: See chest CTA reported separately today. Hepatobiliary: Evidence of hepatic steatosis, otherwise negative. Pancreas: Negative. Spleen: Negative. Adrenals/Urinary Tract: Normal adrenal glands. Renal enhancement is symmetric, kidneys are within normal limits. No nephrolithiasis. Decompressed ureters. Diminutive and negative urinary bladder. Stomach/Bowel: Large bowel appears negative, with diminutive or absent appendix. Normal terminal ileum. No dilated small bowel. Decompressed stomach and duodenum. No free air, free fluid, mesenteric stranding. Vascular/Lymphatic: Major arterial structures in the abdomen and pelvis appear patent and normal. Portal venous system is patent. No lymphadenopathy. Reproductive: Negative. Other: No pelvic free fluid. Musculoskeletal: Negative. IMPRESSION: 1. No acute or inflammatory process identified in the abdomen or pelvis. 2. See also Chest CTA reported separately. 3. Hepatic steatosis. Electronically Signed   By: Genevie Ann M.D.   On: 06/22/2020 10:02    ____________________________________________   PROCEDURES  Procedure(s) performed (including Critical Care):  .Critical Care Performed by: Lucrezia Starch, MD Authorized by: Lucrezia Starch, MD   Critical care provider statement:    Critical care time (minutes):  45   Critical care time was exclusive of:  Separately billable procedures and treating other patients   Critical care was necessary to treat or prevent imminent or life-threatening deterioration of the following conditions:  Circulatory failure   Critical care was time spent personally by me on the following activities:  Discussions with consultants, evaluation of patient's response to treatment,  examination of patient, ordering and performing treatments and interventions, ordering and review of laboratory studies, ordering and review of radiographic studies, pulse oximetry, re-evaluation of patient's condition, obtaining history from patient or surrogate and review of old charts   I assumed direction of critical care for this patient from another provider in my specialty: no       ____________________________________________  INITIAL IMPRESSION / ASSESSMENT AND PLAN / ED COURSE      Patient presents with above-stated exam for persistent left upper quadrant pain, left lower chest pain and left back pain.  On arrival patient is afebrile and hemodynamically stable.  Primary differential includes but is not limited to ACS, arrhythmia, myocarditis, pneumothorax, developing effusion, pneumonia, PE, myocarditis, kidney stone, splenic infarct, and PUD versus GERD.  No history of recent injury.  No exam findings to suggest cellulitis.  CT abdomen pelvis shows no evidence of splenic pathology or kidney stone.  CT chest does show evidence of bilateral PEs without right heart strain as well as some's atelectasis and no evidence of volume overload, pneumothorax, rib fracture or other clear acute intrathoracic process.  ECG is not suggestive of ischemia and no suspicion for ACS or myocarditis given nonelevated troponin.  CBC shows WBC count of 12.9 with normal hemoglobin and platelets.  CMP shows no significant electrolyte or metabolic derangements.  Procalcitonin is undetectable suspicion for bacterial infection at this time.  UA does have some blood and large LES as well as 21-50 WBCs but given patient denies any urinary symptoms will defer any treatment at this time.  Patient started on heparin.  Follow-up noted analgesia given.  I will plan to admit to hospitalist service for further evaluation management.   ____________________________________________   FINAL CLINICAL IMPRESSION(S) / ED  DIAGNOSES  Final diagnoses:  Multiple subsegmental pulmonary emboli without acute cor pulmonale (HCC)  Steatosis  Atherosclerosis    Medications  acetaminophen (TYLENOL) tablet 1,000 mg (has no administration in time range)  HYDROmorphone (DILAUDID) injection 1 mg (1 mg Intravenous Given 06/22/20 0835)  iohexol (OMNIPAQUE) 350 MG/ML injection 100 mL (100 mLs Intravenous Contrast Given 06/22/20 0940)     ED Discharge Orders    None       Note:  This document was prepared using Dragon voice recognition software and may include unintentional dictation errors.   Lucrezia Starch, MD 06/22/20 [id number]

## 2020-06-22 NOTE — ED Notes (Signed)
Called lab and they will add onto blood in lab.

## 2020-06-22 NOTE — H&P (Signed)
History and Physical    Autumn [name] MVH:[id number] DOB: September 29, 1985 DOA: 06/22/2020  Referring MD/NP/PA:   PCP: Pleas Koch, NP   Patient coming from:  The patient is coming from home.  At baseline, pt is independent for most of ADL.        Chief Complaint: Left flank and lower chest pain  HPI: Autumn [name] is a 35 y.o. female with medical history significant of GERD, depression, anxiety, migraine headache, scoliosis, shingles, ovarian cyst, lumbar disc disease, who presents with left flank and upper left lower chest pain  Patient states that her pain started yesterday morning, mainly in left flank area, also involving left lower chest.  The pain is constant, sharp, 7-10 out of 10 in severity, pleuritic, aggravated with deep breaths and inspiration.  Patient does not have cough, no shortness of breath.  No fever or chills.  Does not have nausea or vomiting or diarrhea.  Of note, patient states that she had positive COVID test 12 days ago (no test result available).  initially she just had some runny nose, no fever, chills, shortness breath, chest pain or cough.  Currently no cough, shortness of breath, fever or chills. Patient states that she has chronic bladder issue. She is supposed to have cystoscopy on next Monday with urology.  Currently denies symptoms of UTI. Pt states that she is taking OCP currently.    ED Course: pt was found to have WBC 12.9, pending COVID PCR, urinalysis (cloudy appearance, large amount of leukocyte, many bacteria, squamous cell 6/10, WBC 21-50), troponin level 2, electrolytes renal function okay, temperature normal, blood pressure 110/65, heart rate 71, RR 23, oxygen 793-95% on room air.  Chest x-ray is not impressive.  CT scan of abdomen/pelvis negative for acute issues.  Lower extremity Dopplers negative for DVT.  CT angiogram of chest that showed bilateral PE without CT evidence of right heart strain. Pt is placed on  MedSurg  Observation.  CTA of chest: 1. Bilateral submental and subsegmental pulmonary emboli. 2. RV to LV ratio at the upper range of normal just below what is considered for RIGHT heart strain. Configuration of the heart and RV to LV ratio is actually unchanged since March of 2020. Attention for any symptoms that would suggest cardiac dysfunction with echocardiography as warranted. 3. Basilar atelectasis with developing pulmonary infarct in the LEFT lower lobe. 4. Stable 5 mm RIGHT middle lobe pulmonary nodule. Stability over greater than 1 years time compatible with benign nodule. 5. Hepatic steatosis. 6. Aortic atherosclerosis.   Review of Systems:   General: no fevers, chills, no body weight gain, has fatigue HEENT: no blurry vision, hearing changes or sore throat Respiratory: no dyspnea, coughing, wheezing CV: has left lower chest pain, no palpitations GI: no nausea, vomiting, abdominal pain, diarrhea, constipation. Has left flank pain GU: no dysuria, burning on urination, increased urinary frequency, hematuria  Ext: no leg edema Neuro: no unilateral weakness, numbness, or tingling, no vision change or hearing loss Skin: no rash, no skin tear. MSK: No muscle spasm, no deformity, no limitation of range of movement in spin Heme: No easy bruising.  Travel history: No recent long distant travel.  Allergy:  Allergies  Allergen Reactions  . Imitrex [Sumatriptan] Other (See Comments)    Vomiting, Slurred Speech and Facial Numbness    Past Medical History:  Diagnosis Date  . Anxiety   . Back pain    chronic  . Depression   . Disc disease, degenerative, lumbar or  lumbosacral   . History of ovarian cyst   . Migraine   . Scoliosis   . Shingles 06/20/2018    Past Surgical History:  Procedure Laterality Date  . APPENDECTOMY    . BREAST SURGERY Right 2008   lump/ benign    Social History:  reports that she quit smoking about 3 years ago. Her smoking use included  cigarettes. She has never used smokeless tobacco. She reports current alcohol use. She reports that she does not use drugs.  Family History:  Family History  Problem Relation Age of Onset  . Cancer Mother 1040       breast  . Breast cancer Mother 1940       and 5253  . Alcohol abuse Father   . Vision loss Maternal Grandmother   . Heart disease Maternal Grandmother   . Heart failure Maternal Grandmother   . Cancer Maternal Grandfather   . Alcohol abuse Paternal Grandfather   . Arthritis Maternal Aunt   . Anxiety disorder Maternal Aunt   . Depression Maternal Aunt      Prior to Admission medications   Medication Sig Start Date End Date Taking? Authorizing Provider  ALAYCEN 1/35 tablet TAKE 1 TABLET BY MOUTH DAILY. 11/11/19   Doreene Nestlark, Katherine K, NP  aspirin-acetaminophen-caffeine (EXCEDRIN MIGRAINE) [phone] MG tablet Take by mouth every 6 (six) hours as needed for headache.    [provider]  escitalopram (LEXAPRO) 10 MG tablet Take 1.5 tablets (15 mg total) by mouth daily. 05/26/20   Jomarie LongsEappen, Saramma, MD  ibuprofen (ADVIL) 200 MG tablet Take 200 mg by mouth every 6 (six) hours as needed.    [provider]  pantoprazole (PROTONIX) 40 MG tablet Take 1 tablet (40 mg total) by mouth daily. 06/21/20 07/21/20  Gilles ChiquitoSmith, Zachary P, MD  propranolol (INDERAL) 10 MG tablet Take 1 tablet (10 mg total) by mouth 2 (two) times daily as needed. For anxiety 05/26/20   Jomarie LongsEappen, Saramma, MD  sucralfate (CARAFATE) 1 g tablet Take 1 tablet (1 g total) by mouth 4 (four) times daily for 5 days. 06/21/20 06/26/20  Gilles ChiquitoSmith, Zachary P, MD  valACYclovir (VALTREX) 1000 MG tablet  03/02/19   [provider]    Physical Exam: Vitals:   06/22/20 1045 06/22/20 1100 06/22/20 1115 06/22/20 1130  BP:  108/63  107/76  Pulse: 93 85 77 85  Resp: (!) 22 (!) 21 19 (!) 22  Temp:      TempSrc:      SpO2: 100% 94% 96% 95%  Weight:      Height:       General: Not in acute distress HEENT:       Eyes:  PERRL, EOMI, no scleral icterus.       ENT: No discharge from the ears and nose, no pharynx injection, no tonsillar enlargement.        Neck: No JVD, no bruit, no mass felt. Heme: No neck lymph node enlargement. Cardiac: S1/S2, RRR, No murmurs, No gallops or rubs. Respiratory:  No rales, wheezing, rhonchi or rubs. GI: Soft, nondistended, no rebound pain, no organomegaly, BS present. Has tenderness in LUQ and left flank area GU: No hematuria Ext: No pitting leg edema bilaterally. 2+DP/PT pulse bilaterally. Musculoskeletal: No joint deformities, No joint redness or warmth, no limitation of ROM in spin. Skin: No rashes.  Neuro: Alert, oriented X3, cranial nerves II-XII grossly intact, moves all extremities normally.  Psych: Patient is not psychotic, no suicidal or hemocidal ideation.  Labs on  Admission: I have personally reviewed following labs and imaging studies  CBC: Recent Labs  Lab 06/21/20 0657 06/22/20 0615  WBC 9.0 12.9*  NEUTROABS 5.3  --   HGB 13.9 13.9  HCT 40.3 40.0  MCV 87.8 88.9  PLT 305 [id number]   Basic Metabolic Panel: Recent Labs  Lab 06/21/20 0657 06/22/20 0615  NA 138 140  K 4.3 3.9  CL 105 105  CO2 20* 23  GLUCOSE 101* 111*  BUN 13 13  CREATININE 0.75 0.75  CALCIUM 9.2 9.2   GFR: Estimated Creatinine Clearance: 133.7 mL/min (by C-G formula based on SCr of 0.75 mg/dL). Liver Function Tests: Recent Labs  Lab 06/21/20 0657 06/22/20 0615  AST 21 19  ALT 23 25  ALKPHOS 48 52  BILITOT 0.7 0.7  PROT 7.6 7.3  ALBUMIN 3.9 3.7   No results for input(s): LIPASE, AMYLASE in the last 168 hours. No results for input(s): AMMONIA in the last 168 hours. Coagulation Profile: Recent Labs  Lab 06/22/20 1155  INR 0.9   Cardiac Enzymes: No results for input(s): CKTOTAL, CKMB, CKMBINDEX, TROPONINI in the last 168 hours. BNP (last 3 results) No results for input(s): PROBNP in the last 8760 hours. HbA1C: No results for input(s): HGBA1C in the last 72  hours. CBG: No results for input(s): GLUCAP in the last 168 hours. Lipid Profile: No results for input(s): CHOL, HDL, LDLCALC, TRIG, CHOLHDL, LDLDIRECT in the last 72 hours. Thyroid Function Tests: No results for input(s): TSH, T4TOTAL, FREET4, T3FREE, THYROIDAB in the last 72 hours. Anemia Panel: No results for input(s): VITAMINB12, FOLATE, FERRITIN, TIBC, IRON, RETICCTPCT in the last 72 hours. Urine analysis:    Component Value Date/Time   COLORURINE YELLOW (A) 06/22/2020 0848   APPEARANCEUR CLOUDY (A) 06/22/2020 0848   APPEARANCEUR Cloudy (A) 07/06/2019 0911   LABSPEC 1.024 06/22/2020 0848   LABSPEC 1.030 12/30/2011 0051   PHURINE 5.0 06/22/2020 0848   GLUCOSEU NEGATIVE 06/22/2020 0848   GLUCOSEU Negative 12/30/2011 0051   HGBUR MODERATE (A) 06/22/2020 0848   BILIRUBINUR NEGATIVE 06/22/2020 0848   BILIRUBINUR Negative 07/06/2019 0911   BILIRUBINUR Negative 12/30/2011 0051   KETONESUR NEGATIVE 06/22/2020 0848   PROTEINUR NEGATIVE 06/22/2020 0848   UROBILINOGEN 0.2 05/18/2019 1304   NITRITE NEGATIVE 06/22/2020 0848   LEUKOCYTESUR LARGE (A) 06/22/2020 0848   LEUKOCYTESUR 3+ 12/30/2011 0051   Sepsis Labs: @LABRCNTIP (procalcitonin:4,lacticidven:4) )No results found for this or any previous visit (from the past 240 hour(s)).   Radiological Exams on Admission: DG Chest 2 View  Result Date: 06/21/2020 CLINICAL DATA:  Chest pain. EXAM: CHEST - 2 VIEW COMPARISON:  03/03/2019. FINDINGS: Mediastinum and hilar structures normal. Heart size normal. Low lung volumes. A minimal infiltrate projected over the lower lobes on lateral view cannot be completely excluded. No pleural effusion. No pneumothorax. Mild thoracic spine scoliosis. No acute bony abnormality. IMPRESSION: It minimal infiltrate projected over the lower lobes on lateral view cannot be completely excluded. Electronically Signed   By: Marcello Moores  Register   On: 06/21/2020 07:28   CT Angio Chest PE W and/or Wo Contrast  Result  Date: 06/22/2020 CLINICAL DATA:  Pain radiating to LEFT side, pain with inspiration and movement. EXAM: CT ANGIOGRAPHY CHEST WITH CONTRAST TECHNIQUE: Multidetector CT imaging of the chest was performed using the standard protocol during bolus administration of intravenous contrast. Multiplanar CT image reconstructions and MIPs were obtained to evaluate the vascular anatomy. CONTRAST:  142mL OMNIPAQUE IOHEXOL 350 MG/ML SOLN COMPARISON:  August 16, 2018 FINDINGS: Cardiovascular: Normal  caliber thoracic aorta. The heart size top normal without pericardial effusion. RV to LV ratio of 0.88. Central pulmonary vasculature is normal caliber. Segmental and subsegmental filling defects RIGHT posteromedial lower lobe. Filling posterior segmental distally and extending into subsegmental branches. Images 136 through 148 of series 5. Segmental filling defect in the lateral segment of the LEFT lower lobe as well. Segmental filling defect in the LEFT upper lobe posterior segmental branch, apicoposterior segmental branch. This is occlusive and seen on images 135 through 146 no central filling defect. Filling defect in subsegmental branches in the RIGHT middle lobe involving both medial and lateral RIGHT middle lobe on images 119 and 128 and image 124 of series 5 Mediastinum/Nodes: No thoracic inlet adenopathy. No axillary lymphadenopathy. No mediastinal lymphadenopathy. Esophagus grossly normal. No hilar lymphadenopathy. Lungs/Pleura: 5 mm RIGHT middle lobe pulmonary nodule is unchanged. Basilar volume loss and ground-glass attenuation worse in the LEFT lower lobe. Airways are patent. Upper Abdomen: Hepatic steatosis. No pericholecystic stranding. Gallbladder liver incompletely imaged. Spine select spine imaged portions of pancreas, spleen and adrenal glands are normal. No acute gastrointestinal process on very limited assessment of the upper abdomen. Musculoskeletal: No acute or destructive bone finding. Review of the MIP images  confirms the above findings. IMPRESSION: 1. Bilateral submental and subsegmental pulmonary emboli. 2. RV to LV ratio at the upper range of normal just below what is considered for RIGHT heart strain. Configuration of the heart and RV to LV ratio is actually unchanged since March of 2020. Attention for any symptoms that would suggest cardiac dysfunction with echocardiography as warranted. 3. Basilar atelectasis with developing pulmonary infarct in the LEFT lower lobe. 4. Stable 5 mm RIGHT middle lobe pulmonary nodule. Stability over greater than 1 years time compatible with benign nodule. 5. Hepatic steatosis. 6. Aortic atherosclerosis. Critical Value/emergent results were called by telephone at the time of interpretation on 06/22/2020 at 10:24 am to provider [facility] , who verbally acknowledged these results. Aortic Atherosclerosis (ICD10-I70.0). Electronically Signed   By: Zetta Bills M.D.   On: 06/22/2020 10:24   CT ABDOMEN PELVIS W CONTRAST  Result Date: 06/22/2020 CLINICAL DATA:  35 year old female with abdominal pain. EXAM: CT ABDOMEN AND PELVIS WITH CONTRAST TECHNIQUE: Multidetector CT imaging of the abdomen and pelvis was performed using the standard protocol following bolus administration of intravenous contrast. CONTRAST:  153mL OMNIPAQUE IOHEXOL 350 MG/ML SOLN in conjunction with contrast enhanced imaging of the chest reported separately. COMPARISON:  CTA chest today reported separately. FINDINGS: Lower chest: See chest CTA reported separately today. Hepatobiliary: Evidence of hepatic steatosis, otherwise negative. Pancreas: Negative. Spleen: Negative. Adrenals/Urinary Tract: Normal adrenal glands. Renal enhancement is symmetric, kidneys are within normal limits. No nephrolithiasis. Decompressed ureters. Diminutive and negative urinary bladder. Stomach/Bowel: Large bowel appears negative, with diminutive or absent appendix. Normal terminal ileum. No dilated small bowel. Decompressed stomach and  duodenum. No free air, free fluid, mesenteric stranding. Vascular/Lymphatic: Major arterial structures in the abdomen and pelvis appear patent and normal. Portal venous system is patent. No lymphadenopathy. Reproductive: Negative. Other: No pelvic free fluid. Musculoskeletal: Negative. IMPRESSION: 1. No acute or inflammatory process identified in the abdomen or pelvis. 2. See also Chest CTA reported separately. 3. Hepatic steatosis. Electronically Signed   By: Genevie Ann M.D.   On: 06/22/2020 10:02   US Venous Img Lower Bilateral (DVT)  Result Date: 06/22/2020 CLINICAL DATA:  BILATERAL pulmonary emboli EXAM: BILATERAL LOWER EXTREMITY VENOUS DOPPLER ULTRASOUND TECHNIQUE: Gray-scale sonography with compression, as well as color and duplex  ultrasound, were performed to evaluate the deep venous system(s) from the level of the common femoral vein through the popliteal and proximal calf veins. COMPARISON:  None FINDINGS: VENOUS Normal compressibility of the BILATERAL common femoral, superficial femoral, and popliteal veins, as well as the visualized calf veins. Visualized portions of BILATERAL profunda femoral veins and great saphenous veins are unremarkable. No filling defects to suggest DVT on grayscale or color Doppler imaging. Doppler waveforms show normal direction of venous flow, normal respiratory phasicity and response to augmentation. OTHER None. Limitations: none IMPRESSION: No evidence of deep venous thrombosis in either lower extremity. Electronically Signed   By: Lavonia Dana M.D.   On: 06/22/2020 11:43     EKG: I have personally reviewed.  Sinus rhythm, QTC 423, no ischemic change  Assessment/Plan Principal Problem:   Bilateral pulmonary embolism (HCC) Active Problems:   Anxiety and depression   Migraine   Lung nodule   Leukocytosis   Bilateral pulmonary embolism ([place]): No oxygen desaturation.  No CT evidence of right heart strain, currently hemodynamically stable.  Lower extremity venous  Dopplers negative for DVT.  -Will place on med-surg bed for obs -heparin drip initiated -2D echocardiogram ordered -Hypercoag panel -pain control: When necessary Percocet and morphine -prn albuterol nebs and mucinex  -hold OCP  Anxiety and depression -Continue Lexapro  Migraine -Continue home Excedrin.  Lung nodule -Follow-up with PCP  Leukocytosis: WBC 12.9, no fever.  Likely reactive -Follow-up of a CBC  Positive urinalysis: Patient does not have symptoms of a UTI.  Patient is supposed to get cystoscopy on Monday with neurology -Will not start antibiotics -Follow-up urine culture  ?covid 19 infection: Patient has self-reported positive COVID-19 12 days ago.  No test results available.  Patient does not have symptoms of COVID. -Follow-up PCR of COVID-19       DVT ppx: on IV Heparin  Code Status: Full code Family Communication: not done, no family member is at bed side Disposition Plan:  Anticipate discharge back to previous environment Consults called:  none Admission status: Med-surg bed for obs   Status is: Observation  The patient remains OBS appropriate and will d/c before 2 midnights.  Dispo: The patient is from: Home              Anticipated d/c is to: Home              Anticipated d/c date is: 1 day              Patient currently is not medically stable to d/c.          Date of Service 06/22/2020    Ivor Costa [facility]   If 7PM-7AM, please contact night-coverage [email or web address] 06/22/2020, 1:38 PM

## 2020-06-22 NOTE — ED Notes (Signed)
Dietary contacted to send dinner tray 

## 2020-06-23 ENCOUNTER — Observation Stay (HOSPITAL_BASED_OUTPATIENT_CLINIC_OR_DEPARTMENT_OTHER)
Admit: 2020-06-23 | Discharge: 2020-06-23 | Disposition: A | Discharge status: Home / Self Care | Payer: 59 | Attending: Internal Medicine | Admitting: Internal Medicine

## 2020-06-23 DIAGNOSIS — I2694 Multiple subsegmental pulmonary emboli without acute cor pulmonale: Secondary | ICD-10-CM | POA: Diagnosis not present

## 2020-06-23 DIAGNOSIS — F419 Anxiety disorder, unspecified: Secondary | ICD-10-CM | POA: Diagnosis not present

## 2020-06-23 DIAGNOSIS — F329 Major depressive disorder, single episode, unspecified: Secondary | ICD-10-CM | POA: Diagnosis not present

## 2020-06-23 DIAGNOSIS — G43701 Chronic migraine without aura, not intractable, with status migrainosus: Secondary | ICD-10-CM | POA: Diagnosis not present

## 2020-06-23 DIAGNOSIS — F32A Depression, unspecified: Secondary | ICD-10-CM | POA: Diagnosis not present

## 2020-06-23 DIAGNOSIS — K760 Fatty (change of) liver, not elsewhere classified: Secondary | ICD-10-CM | POA: Diagnosis not present

## 2020-06-23 DIAGNOSIS — I2602 Saddle embolus of pulmonary artery with acute cor pulmonale: Secondary | ICD-10-CM | POA: Diagnosis not present

## 2020-06-23 DIAGNOSIS — Z87891 Personal history of nicotine dependence: Secondary | ICD-10-CM | POA: Diagnosis not present

## 2020-06-23 DIAGNOSIS — I7090 Unspecified atherosclerosis: Secondary | ICD-10-CM | POA: Diagnosis not present

## 2020-06-23 DIAGNOSIS — Z20822 Contact with and (suspected) exposure to covid-19: Secondary | ICD-10-CM | POA: Diagnosis not present

## 2020-06-23 DIAGNOSIS — F3289 Other specified depressive episodes: Secondary | ICD-10-CM | POA: Diagnosis not present

## 2020-06-23 DIAGNOSIS — R0602 Shortness of breath: Secondary | ICD-10-CM | POA: Diagnosis not present

## 2020-06-23 DIAGNOSIS — I2699 Other pulmonary embolism without acute cor pulmonale: Secondary | ICD-10-CM | POA: Diagnosis not present

## 2020-06-23 DIAGNOSIS — Z7982 Long term (current) use of aspirin: Secondary | ICD-10-CM | POA: Diagnosis not present

## 2020-06-23 LAB — BASIC METABOLIC PANEL
Anion gap: 11 (ref 5–15)
BUN: 11 mg/dL (ref 6–20)
CO2: 25 mmol/L (ref 22–32)
Calcium: 9.3 mg/dL (ref 8.9–10.3)
Chloride: 100 mmol/L (ref 98–111)
Creatinine, Ser: 0.63 mg/dL (ref 0.44–1.00)
GFR, Estimated: >60 mL/min (ref >60)
Glucose, Bld: 111 mg/dL — ABNORMAL HIGH (ref 70–99)
Potassium: 3.8 mmol/L (ref 3.5–5.1)
Sodium: 136 mmol/L (ref 135–145)

## 2020-06-23 LAB — ECHOCARDIOGRAM COMPLETE
AR max vel: 1.99 cm2
AV Area VTI: 1.96 cm2
AV Area mean vel: 2.06 cm2
AV Mean grad: 3 mmHg
AV Peak grad: 5.3 mmHg
Ao pk vel: 1.15 m/s
Area-P 1/2: 5.7 cm2
Calc EF: 68.6 %
Height: 70 in
MV VTI: 2.69 cm2
S' Lateral: 3.2 cm
Single Plane A2C EF: 68.8 %
Single Plane A4C EF: 68.2 %
Weight: 3920 oz

## 2020-06-23 LAB — CBC
HCT: 40.3 % (ref 36.0–46.0)
Hemoglobin: 13.6 g/dL (ref 12.0–15.0)
MCH: 30 pg (ref 26.0–34.0)
MCHC: 33.7 g/dL (ref 30.0–36.0)
MCV: 88.8 fL (ref 80.0–100.0)
Platelets: 320 10*3/uL (ref 150–400)
RBC: 4.54 MIL/uL (ref 3.87–5.11)
RDW: 12.3 % (ref 11.5–15.5)
WBC: 8.9 10*3/uL (ref 4.0–10.5)
nRBC: 0 % (ref 0.0–0.2)

## 2020-06-23 LAB — URINE CULTURE

## 2020-06-23 LAB — HEPARIN LEVEL (UNFRACTIONATED)
Heparin Unfractionated: 0.14 IU/mL — ABNORMAL LOW (ref 0.30–0.70)
Heparin Unfractionated: 0.33 IU/mL (ref 0.30–0.70)
Heparin Unfractionated: 0.35 IU/mL (ref 0.30–0.70)
Heparin Unfractionated: 0.54 IU/mL (ref 0.30–0.70)

## 2020-06-23 MED ORDER — HEPARIN (PORCINE) 25000 UT/250ML-% IV SOLN
1700.0000 [IU]/h | INTRAVENOUS | Status: DC
  Administered 2020-06-23 (×2): 1700 [IU]/h via INTRAVENOUS
  Filled 2020-06-23: qty 250

## 2020-06-23 MED ORDER — PANTOPRAZOLE SODIUM 40 MG PO TBEC
40.0000 mg | DELAYED_RELEASE_TABLET | Freq: Every day | ORAL | Status: DC | PRN
Start: 2020-06-23 — End: 2020-06-25

## 2020-06-23 MED ORDER — TRAMADOL HCL 50 MG PO TABS
50.0000 mg | ORAL_TABLET | Freq: Four times a day (QID) | ORAL | Status: DC | PRN
  Administered 2020-06-23 – 2020-06-24 (×3): 50 mg via ORAL
  Filled 2020-06-23 (×4): qty 1

## 2020-06-23 MED ORDER — KETOROLAC TROMETHAMINE 15 MG/ML IJ SOLN
15.0000 mg | Freq: Four times a day (QID) | INTRAMUSCULAR | Status: DC | PRN
  Administered 2020-06-23 – 2020-06-25 (×3): 15 mg via INTRAVENOUS
  Filled 2020-06-23 (×3): qty 1

## 2020-06-23 MED ORDER — ASPIRIN-ACETAMINOPHEN-CAFFEINE 250-250-65 MG PO TABS
1.0000 | ORAL_TABLET | Freq: Four times a day (QID) | ORAL | Status: DC | PRN
  Filled 2020-06-23: qty 1

## 2020-06-23 MED ORDER — HEPARIN BOLUS VIA INFUSION
2800.0000 [IU] | Freq: Once | INTRAVENOUS | Status: AC
  Administered 2020-06-23: 2800 [IU] via INTRAVENOUS
  Filled 2020-06-23: qty 2800

## 2020-06-23 MED ORDER — ESCITALOPRAM OXALATE 10 MG PO TABS
15.0000 mg | ORAL_TABLET | Freq: Every day | ORAL | Status: DC
  Administered 2020-06-23 – 2020-06-25 (×3): 15 mg via ORAL
  Filled 2020-06-23 (×3): qty 1.5

## 2020-06-23 NOTE — Plan of Care (Signed)
  Problem: Clinical Measurements: Goal: Diagnostic test results will improve Outcome: Not Progressing   Problem: Activity: Goal: Risk for activity intolerance will decrease Outcome: Not Progressing   Problem: Pain Managment: Goal: General experience of comfort will improve Outcome: Not Progressing

## 2020-06-23 NOTE — Progress Notes (Signed)
PROGRESS NOTE    EMRI CADD  [id number] DOB: March 13, 1986 DOA: 06/22/2020 PCP: Pleas Koch, NP    Assessment & Plan:   Principal Problem:   Bilateral pulmonary embolism ([place]) Active Problems:   Anxiety and depression   Migraine   Lung nodule   Leukocytosis  Bilateral pulmonary embolism: w/ developing pulmonary infarct. Etiology unclear. Continue on IV heparin drip. Echo pending. B/l LE [place] was neg for DVT. Hypercoagulation work-up is pending. Toradol, tramadol prn for pain. Was taking hormonal birth control at home, discontinue birth control    Depression: severity unknown. Continue on home dose of lexapro.  Migraine: continue on home dose of excedrin prn   Lung nodule: stable 65mm right middle lobe nodule, compatible w/ benign nodule as per rads   Leukocytosis: resolved   Asymptomatic bacteriuria: pt supposed to get cystoscopy on Monday. No GU symptoms currently. Will hold off on abxs for now. Urine cx shows containment   Recent covid 19 infection:  self-reported positive COVID-19 12 days ago.  Repeat COVID19 here is neg  Obesity: BMI 35. Complicates overall care & prognosis   DVT prophylaxis: heparin drip  Code Status: full  Family Communication:  Disposition Plan: likely d/c back home   Status is: Observation  The patient remains OBS appropriate and will d/c before 2 midnights.  Dispo: The patient is from: Home              Anticipated d/c is to: Home              Anticipated d/c date is: 1 day              Patient currently is not medically stable to d/c.    Consultants:      Procedures:    Antimicrobials:    Subjective: Pt c/o left sided chest & back pain   Objective: Vitals:   06/22/20 2000 06/22/20 2354 06/23/20 0304 06/23/20 0442  BP: 105/65 105/65 105/65 112/80  Pulse: 72 81 75 82  Resp: 16 18 18 20   Temp:    98 F (36.7 C)  TempSrc:    Oral  SpO2: 95% 94% 95% 97%  Weight:      Height:       No intake or output  data in the 24 hours ending 06/23/20 0737 Filed Weights   06/22/20 0546  Weight: 111.1 kg    Examination:  General exam: Appears calm but uncomfortable  Respiratory system: diminished breath sounds b/l, scattered crackles  Cardiovascular system: S1 & S2 +. No rubs, gallops or clicks. Gastrointestinal system: Abdomen is nondistended, soft and nontender.  Normal bowel sounds heard. Central nervous system: Alert and oriented. Moves all 4 extremities  Psychiatry: Judgement and insight appear normal. Mood & affect appropriate.     Data Reviewed: I have personally reviewed following labs and imaging studies  CBC: Recent Labs  Lab 06/21/20 0657 06/22/20 0615 06/23/20 0500  WBC 9.0 12.9* 8.9  NEUTROABS 5.3  --   --   HGB 13.9 13.9 13.6  HCT 40.3 40.0 40.3  MCV 87.8 88.9 88.8  PLT 305 306 [id number]   Basic Metabolic Panel: Recent Labs  Lab 06/21/20 0657 06/22/20 0615 06/23/20 0500  NA 138 140 136  K 4.3 3.9 3.8  CL 105 105 100  CO2 20* 23 25  GLUCOSE 101* 111* 111*  BUN 13 13 11   CREATININE 0.75 0.75 0.63  CALCIUM 9.2 9.2 9.3   GFR: Estimated Creatinine Clearance: 133.7 mL/min (by  C-G formula based on SCr of 0.63 mg/dL). Liver Function Tests: Recent Labs  Lab 06/21/20 0657 06/22/20 0615  AST 21 19  ALT 23 25  ALKPHOS 48 52  BILITOT 0.7 0.7  PROT 7.6 7.3  ALBUMIN 3.9 3.7   No results for input(s): LIPASE, AMYLASE in the last 168 hours. No results for input(s): AMMONIA in the last 168 hours. Coagulation Profile: Recent Labs  Lab 06/22/20 1155  INR 0.9   Cardiac Enzymes: No results for input(s): CKTOTAL, CKMB, CKMBINDEX, TROPONINI in the last 168 hours. BNP (last 3 results) No results for input(s): PROBNP in the last 8760 hours. HbA1C: No results for input(s): HGBA1C in the last 72 hours. CBG: No results for input(s): GLUCAP in the last 168 hours. Lipid Profile: No results for input(s): CHOL, HDL, LDLCALC, TRIG, CHOLHDL, LDLDIRECT in the last 72  hours. Thyroid Function Tests: No results for input(s): TSH, T4TOTAL, FREET4, T3FREE, THYROIDAB in the last 72 hours. Anemia Panel: No results for input(s): VITAMINB12, FOLATE, FERRITIN, TIBC, IRON, RETICCTPCT in the last 72 hours. Sepsis Labs: Recent Labs  Lab 06/22/20 0615  PROCALCITON <0.10    Recent Results (from the past 240 hour(s))  SARS CORONAVIRUS 2 (TAT 6-24 HRS) Nasopharyngeal Nasopharyngeal Swab     Status: None   Collection Time: 06/22/20 11:19 AM   Specimen: Nasopharyngeal Swab  Result Value Ref Range Status   SARS Coronavirus 2 NEGATIVE NEGATIVE Final    Comment: (NOTE) SARS-CoV-2 target nucleic acids are NOT DETECTED.  The SARS-CoV-2 RNA is generally detectable in upper and lower respiratory specimens during the acute phase of infection. Negative results do not preclude SARS-CoV-2 infection, do not rule out co-infections with other pathogens, and should not be used as the sole basis for treatment or other patient management decisions. Negative results must be combined with clinical observations, patient history, and epidemiological information. The expected result is Negative.  Fact Sheet for Patients: SugarRoll.be  Fact Sheet for Healthcare Providers: https://www.woods-mathews.com/  This test is not yet approved or cleared by the [place] FDA and  has been authorized for detection and/or diagnosis of SARS-CoV-2 by FDA under an Emergency Use Authorization (EUA). This EUA will remain  in effect (meaning this test can be used) for the duration of the COVID-19 declaration under Se ction 564(b)(1) of the Act, 21 U.S.C. section 360bbb-3(b)(1), unless the authorization is terminated or revoked sooner.  Performed at [facility], [place] [address], [place], [place] [address]          Radiology Studies: CT Angio Chest PE W and/or Wo Contrast  Result Date: 06/22/2020 CLINICAL DATA:  Pain radiating to LEFT  side, pain with inspiration and movement. EXAM: CT ANGIOGRAPHY CHEST WITH CONTRAST TECHNIQUE: Multidetector CT imaging of the chest was performed using the standard protocol during bolus administration of intravenous contrast. Multiplanar CT image reconstructions and MIPs were obtained to evaluate the vascular anatomy. CONTRAST:  184mL OMNIPAQUE IOHEXOL 350 MG/ML SOLN COMPARISON:  August 16, 2018 FINDINGS: Cardiovascular: Normal caliber thoracic aorta. The heart size top normal without pericardial effusion. RV to LV ratio of 0.88. Central pulmonary vasculature is normal caliber. Segmental and subsegmental filling defects RIGHT posteromedial lower lobe. Filling posterior segmental distally and extending into subsegmental branches. Images 136 through 148 of series 5. Segmental filling defect in the lateral segment of the LEFT lower lobe as well. Segmental filling defect in the LEFT upper lobe posterior segmental branch, apicoposterior segmental branch. This is occlusive and seen on images 135 through  146 no central filling defect. Filling defect in subsegmental branches in the RIGHT middle lobe involving both medial and lateral RIGHT middle lobe on images 119 and 128 and image 124 of series 5 Mediastinum/Nodes: No thoracic inlet adenopathy. No axillary lymphadenopathy. No mediastinal lymphadenopathy. Esophagus grossly normal. No hilar lymphadenopathy. Lungs/Pleura: 5 mm RIGHT middle lobe pulmonary nodule is unchanged. Basilar volume loss and ground-glass attenuation worse in the LEFT lower lobe. Airways are patent. Upper Abdomen: Hepatic steatosis. No pericholecystic stranding. Gallbladder liver incompletely imaged. Spine select spine imaged portions of pancreas, spleen and adrenal glands are normal. No acute gastrointestinal process on very limited assessment of the upper abdomen. Musculoskeletal: No acute or destructive bone finding. Review of the MIP images confirms the above findings. IMPRESSION: 1. Bilateral  submental and subsegmental pulmonary emboli. 2. RV to LV ratio at the upper range of normal just below what is considered for RIGHT heart strain. Configuration of the heart and RV to LV ratio is actually unchanged since March of 2020. Attention for any symptoms that would suggest cardiac dysfunction with echocardiography as warranted. 3. Basilar atelectasis with developing pulmonary infarct in the LEFT lower lobe. 4. Stable 5 mm RIGHT middle lobe pulmonary nodule. Stability over greater than 1 years time compatible with benign nodule. 5. Hepatic steatosis. 6. Aortic atherosclerosis. Critical Value/emergent results were called by telephone at the time of interpretation on 06/22/2020 at 10:24 am to provider [facility] , who verbally acknowledged these results. Aortic Atherosclerosis (ICD10-I70.0). Electronically Signed   By: Zetta Bills M.D.   On: 06/22/2020 10:24   CT ABDOMEN PELVIS W CONTRAST  Result Date: 06/22/2020 CLINICAL DATA:  35 year old female with abdominal pain. EXAM: CT ABDOMEN AND PELVIS WITH CONTRAST TECHNIQUE: Multidetector CT imaging of the abdomen and pelvis was performed using the standard protocol following bolus administration of intravenous contrast. CONTRAST:  155mL OMNIPAQUE IOHEXOL 350 MG/ML SOLN in conjunction with contrast enhanced imaging of the chest reported separately. COMPARISON:  CTA chest today reported separately. FINDINGS: Lower chest: See chest CTA reported separately today. Hepatobiliary: Evidence of hepatic steatosis, otherwise negative. Pancreas: Negative. Spleen: Negative. Adrenals/Urinary Tract: Normal adrenal glands. Renal enhancement is symmetric, kidneys are within normal limits. No nephrolithiasis. Decompressed ureters. Diminutive and negative urinary bladder. Stomach/Bowel: Large bowel appears negative, with diminutive or absent appendix. Normal terminal ileum. No dilated small bowel. Decompressed stomach and duodenum. No free air, free fluid, mesenteric  stranding. Vascular/Lymphatic: Major arterial structures in the abdomen and pelvis appear patent and normal. Portal venous system is patent. No lymphadenopathy. Reproductive: Negative. Other: No pelvic free fluid. Musculoskeletal: Negative. IMPRESSION: 1. No acute or inflammatory process identified in the abdomen or pelvis. 2. See also Chest CTA reported separately. 3. Hepatic steatosis. Electronically Signed   By: Genevie Ann M.D.   On: 06/22/2020 10:02   US Venous Img Lower Bilateral (DVT)  Result Date: 06/22/2020 CLINICAL DATA:  BILATERAL pulmonary emboli EXAM: BILATERAL LOWER EXTREMITY VENOUS DOPPLER ULTRASOUND TECHNIQUE: Gray-scale sonography with compression, as well as color and duplex ultrasound, were performed to evaluate the deep venous system(s) from the level of the common femoral vein through the popliteal and proximal calf veins. COMPARISON:  None FINDINGS: VENOUS Normal compressibility of the BILATERAL common femoral, superficial femoral, and popliteal veins, as well as the visualized calf veins. Visualized portions of BILATERAL profunda femoral veins and great saphenous veins are unremarkable. No filling defects to suggest DVT on grayscale or color Doppler imaging. Doppler waveforms show normal direction of venous flow, normal respiratory phasicity and  response to augmentation. OTHER None. Limitations: none IMPRESSION: No evidence of deep venous thrombosis in either lower extremity. Electronically Signed   By: Lavonia Dana M.D.   On: 06/22/2020 11:43        Scheduled Meds: . acetaminophen  1,000 mg Oral Once  . escitalopram  15 mg Oral Daily   Continuous Infusions: . heparin 1,500 Units/hr (06/22/20 1200)     LOS: 0 days    Time spent: 35 mins     Wyvonnia Dusky, MD [facility] Pager [phone]  If 7PM-7AM, please contact night-coverage 06/23/2020, 7:37 AM

## 2020-06-23 NOTE — ED Notes (Signed)
Peri wick placed

## 2020-06-23 NOTE — Consult Note (Signed)
ANTICOAGULATION CONSULT NOTE  Pharmacy Consult for Heparin Infusion Indication: pulmonary embolus  Patient Measurements: Heparin Dosing Weight: 93.3 kg  Labs: Recent Labs    06/21/20 0657 06/22/20 0615 06/22/20 1155 06/22/20 1853 06/23/20 0059 06/23/20 0500 06/23/20 1704  HGB 13.9 13.9  --   --   --  13.6  --   HCT 40.3 40.0  --   --   --  40.3  --   PLT 305 306  --   --   --  320  --   APTT  --   --  25  --   --   --   --   LABPROT  --   --  12.0  --   --   --   --   INR  --   --  0.9  --   --   --   --   HEPARINUNFRC  --   --   --    < > 0.33 0.14* 0.54  CREATININE 0.75 0.75  --   --   --  0.63  --   TROPONINIHS <2 2  --   --   --   --   --    < > = values in this interval not displayed.    Estimated Creatinine Clearance: 133.7 mL/min (by C-G formula based on SCr of 0.63 mg/dL).   Medical History: Past Medical History:  Diagnosis Date  . Anxiety   . Back pain    chronic  . Depression   . Disc disease, degenerative, lumbar or lumbosacral   . History of ovarian cyst   . Migraine   . Scoliosis   . Shingles 06/20/2018    Medications:  No anticoagulation prior to admission per my chart review  Assessment: Patient is a 35 y/o F with medical history as above who presented to the ED complaining of left flank pain and lower chest pain. Pt tested positive for COVID ~1/1. CTA with findings of Bilateral submental and subsegmental pulmonary emboli. Pharmacy consulted to initiate heparin infusion for PE.  Baseline H&H within normal limits. Baseline aPTT 25 and INR 0.9.   1/12 1200 INITIAL= 6000 unit IV bolus x 1 followed by continuous infusion at 1500 units/hr 1/12 1853 HL = 0.37 - therapeutic x 1 1/13 0059 HL = 0.33 - therapeutic x 2 01/13 0500 HL = 0.14, subtherapeutic, Called RN, drip not stopped, no line issues reported. 01/14 1704 HL = 0.54, therapeutic x 1   Goal of Therapy:  Heparin level 0.3-0.7 units/ml Monitor platelets by anticoagulation protocol: Yes    Plan:  --Continue heparin infusion rate of 1700 units/hr --Recheck HL in 6 hours. --Daily CBC per protocol while on heparin infusion   Benn Moulder, PharmD Pharmacy Resident  06/23/2020 5:35 PM

## 2020-06-23 NOTE — Consult Note (Signed)
ANTICOAGULATION CONSULT NOTE  Pharmacy Consult for Heparin Infusion Indication: pulmonary embolus  Patient Measurements: Heparin Dosing Weight: 93.3 kg  Labs: Recent Labs    06/21/20 0657 06/22/20 0615 06/22/20 1155 06/22/20 1853 06/23/20 0059 06/23/20 0500  HGB 13.9 13.9  --   --   --  13.6  HCT 40.3 40.0  --   --   --  40.3  PLT 305 306  --   --   --  320  APTT  --   --  25  --   --   --   LABPROT  --   --  12.0  --   --   --   INR  --   --  0.9  --   --   --   HEPARINUNFRC  --   --   --  0.37 0.33 0.14*  CREATININE 0.75 0.75  --   --   --  0.63  TROPONINIHS <2 2  --   --   --   --     Estimated Creatinine Clearance: 133.7 mL/min (by C-G formula based on SCr of 0.63 mg/dL).   Medical History: Past Medical History:  Diagnosis Date  . Anxiety   . Back pain    chronic  . Depression   . Disc disease, degenerative, lumbar or lumbosacral   . History of ovarian cyst   . Migraine   . Scoliosis   . Shingles 06/20/2018    Medications:  No anticoagulation prior to admission per my chart review  Assessment: Patient is a 35 y/o F with medical history as above who presented to the ED complaining of left flank pain. CTA with findings of Bilateral submental and subsegmental pulmonary emboli. Pharmacy consulted to initiate heparin infusion for PE.  Baseline H&H within normal limits. Baseline aPTT and PT-INR pending.   1/12 1200 INITIAL= 6000 unit IV bolus x 1 followed by continuous infusion at 1500 units/hr 1/12 1853 HL = 0.37 - therapeutic x 1 1/13 0059 HL = 0.33 - therapeutic x 2 01/13 0500 HL = 0.14, subtherapeutic, Called RN, drip not stopped, no line issues reported.  Goal of Therapy:  Heparin level 0.3-0.7 units/ml Monitor platelets by anticoagulation protocol: Yes   Plan:  --Order bolus of 2800 units --Increase heparin infusion rate to 1700 units/hr --Recheck HL in 6 hours. --Daily CBC per protocol while on heparin infusion   Renda Rolls, PharmD,  [facility] 06/23/2020 6:48 AM

## 2020-06-23 NOTE — Consult Note (Signed)
ANTICOAGULATION CONSULT NOTE  Pharmacy Consult for Heparin Infusion Indication: pulmonary embolus  Patient Measurements: Heparin Dosing Weight: 93.3 kg  Labs: Recent Labs    06/21/20 0657 06/22/20 0615 06/22/20 1155 06/22/20 1853 06/23/20 0059  HGB 13.9 13.9  --   --   --   HCT 40.3 40.0  --   --   --   PLT 305 306  --   --   --   APTT  --   --  25  --   --   LABPROT  --   --  12.0  --   --   INR  --   --  0.9  --   --   HEPARINUNFRC  --   --   --  0.37 0.33  CREATININE 0.75 0.75  --   --   --   TROPONINIHS <2 2  --   --   --     Estimated Creatinine Clearance: 133.7 mL/min (by C-G formula based on SCr of 0.75 mg/dL).   Medical History: Past Medical History:  Diagnosis Date  . Anxiety   . Back pain    chronic  . Depression   . Disc disease, degenerative, lumbar or lumbosacral   . History of ovarian cyst   . Migraine   . Scoliosis   . Shingles 06/20/2018    Medications:  No anticoagulation prior to admission per my chart review  Assessment: Patient is a 35 y/o F with medical history as above who presented to the ED complaining of left flank pain. CTA with findings of Bilateral submental and subsegmental pulmonary emboli. Pharmacy consulted to initiate heparin infusion for PE.  Baseline H&H within normal limits. Baseline aPTT and PT-INR pending.   1/12 1200 INITIAL= 6000 unit IV bolus x 1 followed by continuous infusion at 1500 units/hr 1/12 1853 HL = 0.37 - therapeutic x 1 1/13 0059 HL = 0.33 - therapeutic x 2   Goal of Therapy:  Heparin level 0.3-0.7 units/ml Monitor platelets by anticoagulation protocol: Yes   Plan:  --Heparin level even lower on low end of therapeutic range. Will continue infusion at current rate  --Recheck heparin level in with AM labs to confirm level remains therapeutic --Daily CBC per protocol while on heparin infusion   Renda Rolls, PharmD, [facility] 06/23/2020 2:07 AM

## 2020-06-23 NOTE — Progress Notes (Signed)
*  PRELIMINARY RESULTS* Echocardiogram 2D Echocardiogram has been performed.  Autumn [name] 06/23/2020, 10:26 AM

## 2020-06-24 ENCOUNTER — Telehealth: Payer: Self-pay | Admitting: Primary Care

## 2020-06-24 ENCOUNTER — Telehealth: Payer: 59 | Admitting: Psychiatry

## 2020-06-24 DIAGNOSIS — K760 Fatty (change of) liver, not elsewhere classified: Secondary | ICD-10-CM | POA: Diagnosis not present

## 2020-06-24 DIAGNOSIS — F32A Depression, unspecified: Secondary | ICD-10-CM | POA: Diagnosis not present

## 2020-06-24 DIAGNOSIS — Z87891 Personal history of nicotine dependence: Secondary | ICD-10-CM | POA: Diagnosis not present

## 2020-06-24 DIAGNOSIS — I7090 Unspecified atherosclerosis: Secondary | ICD-10-CM | POA: Diagnosis not present

## 2020-06-24 DIAGNOSIS — I2699 Other pulmonary embolism without acute cor pulmonale: Secondary | ICD-10-CM | POA: Diagnosis not present

## 2020-06-24 DIAGNOSIS — Z20822 Contact with and (suspected) exposure to covid-19: Secondary | ICD-10-CM | POA: Diagnosis not present

## 2020-06-24 DIAGNOSIS — G43701 Chronic migraine without aura, not intractable, with status migrainosus: Secondary | ICD-10-CM | POA: Diagnosis not present

## 2020-06-24 DIAGNOSIS — R0602 Shortness of breath: Secondary | ICD-10-CM | POA: Diagnosis not present

## 2020-06-24 DIAGNOSIS — F3289 Other specified depressive episodes: Secondary | ICD-10-CM | POA: Diagnosis not present

## 2020-06-24 DIAGNOSIS — I2694 Multiple subsegmental pulmonary emboli without acute cor pulmonale: Secondary | ICD-10-CM | POA: Diagnosis not present

## 2020-06-24 DIAGNOSIS — F329 Major depressive disorder, single episode, unspecified: Secondary | ICD-10-CM | POA: Diagnosis not present

## 2020-06-24 DIAGNOSIS — Z7982 Long term (current) use of aspirin: Secondary | ICD-10-CM | POA: Diagnosis not present

## 2020-06-24 DIAGNOSIS — F419 Anxiety disorder, unspecified: Secondary | ICD-10-CM | POA: Diagnosis not present

## 2020-06-24 LAB — CBC
HCT: 37 % (ref 36.0–46.0)
Hemoglobin: 12.7 g/dL (ref 12.0–15.0)
MCH: 30.6 pg (ref 26.0–34.0)
MCHC: 34.3 g/dL (ref 30.0–36.0)
MCV: 89.2 fL (ref 80.0–100.0)
Platelets: 287 10*3/uL (ref 150–400)
RBC: 4.15 MIL/uL (ref 3.87–5.11)
RDW: 12 % (ref 11.5–15.5)
WBC: 9.3 10*3/uL (ref 4.0–10.5)
nRBC: 0 % (ref 0.0–0.2)

## 2020-06-24 LAB — BASIC METABOLIC PANEL
Anion gap: 10 (ref 5–15)
BUN: 9 mg/dL (ref 6–20)
CO2: 25 mmol/L (ref 22–32)
Calcium: 8.7 mg/dL — ABNORMAL LOW (ref 8.9–10.3)
Chloride: 102 mmol/L (ref 98–111)
Creatinine, Ser: 0.69 mg/dL (ref 0.44–1.00)
GFR, Estimated: >60 mL/min (ref >60)
Glucose, Bld: 107 mg/dL — ABNORMAL HIGH (ref 70–99)
Potassium: 4.1 mmol/L (ref 3.5–5.1)
Sodium: 137 mmol/L (ref 135–145)

## 2020-06-24 LAB — HEPARIN LEVEL (UNFRACTIONATED): Heparin Unfractionated: 0.38 IU/mL (ref 0.30–0.70)

## 2020-06-24 LAB — LUPUS ANTICOAGULANT PANEL
DRVVT: 24.8 s (ref 0.0–47.0)
PTT Lupus Anticoagulant: 40.7 s (ref 0.0–51.9)

## 2020-06-24 LAB — HOMOCYSTEINE: Homocysteine: 6.5 umol/L (ref 0.0–14.5)

## 2020-06-24 LAB — PROTEIN S ACTIVITY: Protein S Activity: 61 % — ABNORMAL LOW (ref 63–140)

## 2020-06-24 LAB — PROTEIN C, TOTAL: Protein C, Total: 100 % (ref 60–150)

## 2020-06-24 LAB — PROTEIN C ACTIVITY: Protein C Activity: 127 % (ref 73–180)

## 2020-06-24 LAB — PROTEIN S, TOTAL: Protein S Ag, Total: 77 % (ref 60–150)

## 2020-06-24 MED ORDER — APIXABAN 5 MG PO TABS
10.0000 mg | ORAL_TABLET | Freq: Two times a day (BID) | ORAL | Status: DC
  Administered 2020-06-24 – 2020-06-25 (×3): 10 mg via ORAL
  Filled 2020-06-24 (×3): qty 2

## 2020-06-24 MED ORDER — CYCLOBENZAPRINE HCL 10 MG PO TABS
5.0000 mg | ORAL_TABLET | Freq: Two times a day (BID) | ORAL | Status: DC | PRN
  Administered 2020-06-25: 5 mg via ORAL
  Filled 2020-06-24 (×2): qty 1

## 2020-06-24 MED ORDER — DOCUSATE SODIUM 100 MG PO CAPS
200.0000 mg | ORAL_CAPSULE | Freq: Two times a day (BID) | ORAL | Status: DC
  Administered 2020-06-24 (×2): 200 mg via ORAL
  Filled 2020-06-24 (×2): qty 2

## 2020-06-24 MED ORDER — POLYETHYLENE GLYCOL 3350 17 G PO PACK
17.0000 g | PACK | Freq: Every day | ORAL | Status: DC
  Administered 2020-06-24 – 2020-06-25 (×2): 17 g via ORAL
  Filled 2020-06-24 (×2): qty 1

## 2020-06-24 MED ORDER — APIXABAN 5 MG PO TABS: 5.0000 mg | ORAL_TABLET | Freq: Two times a day (BID) | ORAL | Status: DC

## 2020-06-24 NOTE — Telephone Encounter (Signed)
Spoke with patient via phone, tentative plan is for discharge tomorrow.  We will get her scheduled for Tuesday next week for hospital follow up and finish [employer] paperwork.   [place] or [place], can one of you ladies get patient scheduled for hospital follow up for 06/28/20?

## 2020-06-24 NOTE — Telephone Encounter (Signed)
Called patient and scheduled.

## 2020-06-24 NOTE — Telephone Encounter (Signed)
FMLA Paperwork received via fax for patient. Patient currently hospitalized in [facility]. Paperwork semi filled, as pt is still hospitalized and unsure how long leave will need to be, and placed on providers inbox. Please review and sign.

## 2020-06-24 NOTE — Consult Note (Signed)
ANTICOAGULATION CONSULT NOTE  Pharmacy Consult for Heparin Infusion Indication: pulmonary embolus  Patient Measurements: Heparin Dosing Weight: 93.3 kg  Labs: Recent Labs    06/21/20 0657 06/22/20 0615 06/22/20 1155 06/22/20 1853 06/23/20 0500 06/23/20 1704 06/23/20 2315  HGB 13.9 13.9  --   --  13.6  --   --   HCT 40.3 40.0  --   --  40.3  --   --   PLT 305 306  --   --  320  --   --   APTT  --   --  25  --   --   --   --   LABPROT  --   --  12.0  --   --   --   --   INR  --   --  0.9  --   --   --   --   HEPARINUNFRC  --   --   --    < > 0.14* 0.54 0.35  CREATININE 0.75 0.75  --   --  0.63  --   --   TROPONINIHS <2 2  --   --   --   --   --    < > = values in this interval not displayed.    Estimated Creatinine Clearance: 133.7 mL/min (by C-G formula based on SCr of 0.63 mg/dL).  Medical History: Past Medical History:  Diagnosis Date  . Anxiety   . Back pain    chronic  . Depression   . Disc disease, degenerative, lumbar or lumbosacral   . History of ovarian cyst   . Migraine   . Scoliosis   . Shingles 06/20/2018    Medications:  No anticoagulation prior to admission per my chart review  Assessment: Patient is a 35 y/o F with medical history as above who presented to the ED complaining of left flank pain and lower chest pain. Pt tested positive for COVID ~1/1. CTA with findings of Bilateral submental and subsegmental pulmonary emboli. Pharmacy consulted to initiate heparin infusion for PE.  Baseline H&H within normal limits. Baseline aPTT 25 and INR 0.9.   1/12 1200 INITIAL= 6000 unit IV bolus x 1 followed by continuous infusion at 1500 units/hr 1/12 1853 HL = 0.37 - therapeutic x 1 1/13 0059 HL = 0.33 - therapeutic x 2 01/13 0500 HL = 0.14, subtherapeutic, Called RN, drip not stopped, no line issues reported. 01/14 1704 HL = 0.54, therapeutic x 1  01/13 2315 HL = 0.35, therapeutic x 2  Goal of Therapy:  Heparin level 0.3-0.7 units/ml Monitor platelets  by anticoagulation protocol: Yes   Plan:  --Continue heparin infusion rate of 1700 units/hr --Recheck HL in am. --Daily CBC per protocol while on heparin infusion   Ena Dawley, PharmD 06/24/2020 12:08 AM

## 2020-06-24 NOTE — Progress Notes (Signed)
PROGRESS NOTE    Autumn [name]  YWV:[id number] DOB: Jul 27, 1985 DOA: 06/22/2020 PCP: Pleas Koch, NP    Assessment & Plan:   Principal Problem:   Bilateral pulmonary embolism ([place]) Active Problems:   Anxiety and depression   Migraine   Lung nodule   Leukocytosis  Bilateral pulmonary embolism: w/ developing pulmonary infarct. Etiology unclear. D/c IV heparin drip and start eliquis 10mg  BID x 7 days then 5mg  BID x 3 months at least. Echo shows EF 85-46%, normal diastolic function, no right heart strain & no shunts. B/l LE [place] was neg for DVT. Hypercoagulation work-up is pending. Still w/ pain today, continue on toradol, tramadol prn. Was taking hormonal birth control at home, discontinue birth control    Depression: severity unknown. Continue on home dose of lexapro   Migraine: continue on home dose of excedrin prn   Lung nodule: stable 23mm right middle lobe nodule, compatible w/ benign nodule as per rads   Leukocytosis: resolved   Asymptomatic bacteriuria: pt supposed to get cystoscopy on Monday. No GU symptoms currently. Will hold off on abxs for now. Urine cx shows containment & repeat urine cx ordered  Recent covid 19 infection:  self-reported positive COVID-19 12 days ago.  Repeat COVID19 here is neg  Obesity: BMI 35. Complicates overall care & prognosis   DVT prophylaxis: heparin drip  Code Status: full  Family Communication: pt's mother was at bedside and did not have any questions Disposition Plan: likely d/c back home   Status is: Observation  The patient remains OBS appropriate and will d/c before 2 midnights.  Dispo: The patient is from: Home              Anticipated d/c is to: Home              Anticipated d/c date is: likely d/c home tomorrow               Patient currently is not medically stable to d/c.    Consultants:      Procedures:    Antimicrobials:    Subjective: Pt still c/o left sided chest pain & back  pain  Objective: Vitals:   06/23/20 1527 06/23/20 2004 06/23/20 2339 06/24/20 0514  BP: 99/74 110/82 91/63 103/74  Pulse: 84 86 80 77  Resp: 16 18 16 18   Temp: 99 F (37.2 C) 98.3 F (36.8 C) 98.1 F (36.7 C) 98.6 F (37 C)  TempSrc: Oral  Oral Oral  SpO2: 97% 95% 94% 96%  Weight:      Height:        Intake/Output Summary (Last 24 hours) at 06/24/2020 0732 Last data filed at 06/24/2020 0428 Gross per 24 hour  Intake 540.88 ml  Output -  Net 540.88 ml   Filed Weights   06/22/20 0546  Weight: 111.1 kg    Examination:  General exam: Appears calm but uncomfortable  Respiratory system: decreased breath sounds b/l. No rales  Cardiovascular system: S1/S2+. No rubs or clicks  Gastrointestinal system: Abd is soft, obese, NT & hypoactive bowel sounds  Central nervous system: Alert and oriented. Moves all 4 extremities  Psychiatry: Judgement and insight appear normal. Normal mood and affect      Data Reviewed: I have personally reviewed following labs and imaging studies  CBC: Recent Labs  Lab 06/21/20 0657 06/22/20 0615 06/23/20 0500 06/24/20 0655  WBC 9.0 12.9* 8.9 9.3  NEUTROABS 5.3  --   --   --   HGB  13.9 13.9 13.6 12.7  HCT 40.3 40.0 40.3 37.0  MCV 87.8 88.9 88.8 89.2  PLT 305 306 320 [id number]   Basic Metabolic Panel: Recent Labs  Lab 06/21/20 0657 06/22/20 0615 06/23/20 0500 06/24/20 0655  NA 138 140 136 137  K 4.3 3.9 3.8 4.1  CL 105 105 100 102  CO2 20* 23 25 25   GLUCOSE 101* 111* 111* 107*  BUN 13 13 11 9   CREATININE 0.75 0.75 0.63 0.69  CALCIUM 9.2 9.2 9.3 8.7*   GFR: Estimated Creatinine Clearance: 133.7 mL/min (by C-G formula based on SCr of 0.69 mg/dL). Liver Function Tests: Recent Labs  Lab 06/21/20 0657 06/22/20 0615  AST 21 19  ALT 23 25  ALKPHOS 48 52  BILITOT 0.7 0.7  PROT 7.6 7.3  ALBUMIN 3.9 3.7   No results for input(s): LIPASE, AMYLASE in the last 168 hours. No results for input(s): AMMONIA in the last 168  hours. Coagulation Profile: Recent Labs  Lab 06/22/20 1155  INR 0.9   Cardiac Enzymes: No results for input(s): CKTOTAL, CKMB, CKMBINDEX, TROPONINI in the last 168 hours. BNP (last 3 results) No results for input(s): PROBNP in the last 8760 hours. HbA1C: No results for input(s): HGBA1C in the last 72 hours. CBG: No results for input(s): GLUCAP in the last 168 hours. Lipid Profile: No results for input(s): CHOL, HDL, LDLCALC, TRIG, CHOLHDL, LDLDIRECT in the last 72 hours. Thyroid Function Tests: No results for input(s): TSH, T4TOTAL, FREET4, T3FREE, THYROIDAB in the last 72 hours. Anemia Panel: No results for input(s): VITAMINB12, FOLATE, FERRITIN, TIBC, IRON, RETICCTPCT in the last 72 hours. Sepsis Labs: Recent Labs  Lab 06/22/20 0615  PROCALCITON <0.10    Recent Results (from the past 240 hour(s))  Urine Culture     Status: Abnormal   Collection Time: 06/22/20  8:48 AM   Specimen: Urine, Clean Catch  Result Value Ref Range Status   Specimen Description   Final    URINE, CLEAN CATCH Performed at [facility], [address], [place], [place] [address]    Special Requests   Final    NONE Performed at [facility], [address] [place], [place] [address]    Culture MULTIPLE SPECIES PRESENT, SUGGEST RECOLLECTION (A)  Final   Report Status 06/23/2020 FINAL  Final  SARS CORONAVIRUS 2 (TAT 6-24 HRS) Nasopharyngeal Nasopharyngeal Swab     Status: None   Collection Time: 06/22/20 11:19 AM   Specimen: Nasopharyngeal Swab  Result Value Ref Range Status   SARS Coronavirus 2 NEGATIVE NEGATIVE Final    Comment: (NOTE) SARS-CoV-2 target nucleic acids are NOT DETECTED.  The SARS-CoV-2 RNA is generally detectable in upper and lower respiratory specimens during the acute phase of infection. Negative results do not preclude SARS-CoV-2 infection, do not rule out co-infections with other pathogens, and should not be used as the sole basis for treatment or  other patient management decisions. Negative results must be combined with clinical observations, patient history, and epidemiological information. The expected result is Negative.  Fact Sheet for Patients: SugarRoll.be  Fact Sheet for Healthcare Providers: https://www.woods-mathews.com/  This test is not yet approved or cleared by the [place] FDA and  has been authorized for detection and/or diagnosis of SARS-CoV-2 by FDA under an Emergency Use Authorization (EUA). This EUA will remain  in effect (meaning this test can be used) for the duration of the COVID-19 declaration under Se ction 564(b)(1) of the Act, 21 U.S.C. section 360bbb-3(b)(1), unless the authorization  is terminated or revoked sooner.  Performed at [facility], [place] [address], [place], [place] [address]          Radiology Studies: CT Angio Chest PE W and/or Wo Contrast  Result Date: 06/22/2020 CLINICAL DATA:  Pain radiating to LEFT side, pain with inspiration and movement. EXAM: CT ANGIOGRAPHY CHEST WITH CONTRAST TECHNIQUE: Multidetector CT imaging of the chest was performed using the standard protocol during bolus administration of intravenous contrast. Multiplanar CT image reconstructions and MIPs were obtained to evaluate the vascular anatomy. CONTRAST:  131mL OMNIPAQUE IOHEXOL 350 MG/ML SOLN COMPARISON:  August 16, 2018 FINDINGS: Cardiovascular: Normal caliber thoracic aorta. The heart size top normal without pericardial effusion. RV to LV ratio of 0.88. Central pulmonary vasculature is normal caliber. Segmental and subsegmental filling defects RIGHT posteromedial lower lobe. Filling posterior segmental distally and extending into subsegmental branches. Images 136 through 148 of series 5. Segmental filling defect in the lateral segment of the LEFT lower lobe as well. Segmental filling defect in the LEFT upper lobe posterior segmental branch, apicoposterior segmental  branch. This is occlusive and seen on images 135 through 146 no central filling defect. Filling defect in subsegmental branches in the RIGHT middle lobe involving both medial and lateral RIGHT middle lobe on images 119 and 128 and image 124 of series 5 Mediastinum/Nodes: No thoracic inlet adenopathy. No axillary lymphadenopathy. No mediastinal lymphadenopathy. Esophagus grossly normal. No hilar lymphadenopathy. Lungs/Pleura: 5 mm RIGHT middle lobe pulmonary nodule is unchanged. Basilar volume loss and ground-glass attenuation worse in the LEFT lower lobe. Airways are patent. Upper Abdomen: Hepatic steatosis. No pericholecystic stranding. Gallbladder liver incompletely imaged. Spine select spine imaged portions of pancreas, spleen and adrenal glands are normal. No acute gastrointestinal process on very limited assessment of the upper abdomen. Musculoskeletal: No acute or destructive bone finding. Review of the MIP images confirms the above findings. IMPRESSION: 1. Bilateral submental and subsegmental pulmonary emboli. 2. RV to LV ratio at the upper range of normal just below what is considered for RIGHT heart strain. Configuration of the heart and RV to LV ratio is actually unchanged since March of 2020. Attention for any symptoms that would suggest cardiac dysfunction with echocardiography as warranted. 3. Basilar atelectasis with developing pulmonary infarct in the LEFT lower lobe. 4. Stable 5 mm RIGHT middle lobe pulmonary nodule. Stability over greater than 1 years time compatible with benign nodule. 5. Hepatic steatosis. 6. Aortic atherosclerosis. Critical Value/emergent results were called by telephone at the time of interpretation on 06/22/2020 at 10:24 am to provider [facility] , who verbally acknowledged these results. Aortic Atherosclerosis (ICD10-I70.0). Electronically Signed   By: Zetta Bills M.D.   On: 06/22/2020 10:24   CT ABDOMEN PELVIS W CONTRAST  Result Date: 06/22/2020 CLINICAL DATA:   35 year old female with abdominal pain. EXAM: CT ABDOMEN AND PELVIS WITH CONTRAST TECHNIQUE: Multidetector CT imaging of the abdomen and pelvis was performed using the standard protocol following bolus administration of intravenous contrast. CONTRAST:  137mL OMNIPAQUE IOHEXOL 350 MG/ML SOLN in conjunction with contrast enhanced imaging of the chest reported separately. COMPARISON:  CTA chest today reported separately. FINDINGS: Lower chest: See chest CTA reported separately today. Hepatobiliary: Evidence of hepatic steatosis, otherwise negative. Pancreas: Negative. Spleen: Negative. Adrenals/Urinary Tract: Normal adrenal glands. Renal enhancement is symmetric, kidneys are within normal limits. No nephrolithiasis. Decompressed ureters. Diminutive and negative urinary bladder. Stomach/Bowel: Large bowel appears negative, with diminutive or absent appendix. Normal terminal ileum. No dilated small bowel. Decompressed stomach and duodenum.  No free air, free fluid, mesenteric stranding. Vascular/Lymphatic: Major arterial structures in the abdomen and pelvis appear patent and normal. Portal venous system is patent. No lymphadenopathy. Reproductive: Negative. Other: No pelvic free fluid. Musculoskeletal: Negative. IMPRESSION: 1. No acute or inflammatory process identified in the abdomen or pelvis. 2. See also Chest CTA reported separately. 3. Hepatic steatosis. Electronically Signed   By: Genevie Ann M.D.   On: 06/22/2020 10:02   US Venous Img Lower Bilateral (DVT)  Result Date: 06/22/2020 CLINICAL DATA:  BILATERAL pulmonary emboli EXAM: BILATERAL LOWER EXTREMITY VENOUS DOPPLER ULTRASOUND TECHNIQUE: Gray-scale sonography with compression, as well as color and duplex ultrasound, were performed to evaluate the deep venous system(s) from the level of the common femoral vein through the popliteal and proximal calf veins. COMPARISON:  None FINDINGS: VENOUS Normal compressibility of the BILATERAL common femoral, superficial  femoral, and popliteal veins, as well as the visualized calf veins. Visualized portions of BILATERAL profunda femoral veins and great saphenous veins are unremarkable. No filling defects to suggest DVT on grayscale or color Doppler imaging. Doppler waveforms show normal direction of venous flow, normal respiratory phasicity and response to augmentation. OTHER None. Limitations: none IMPRESSION: No evidence of deep venous thrombosis in either lower extremity. Electronically Signed   By: Lavonia Dana M.D.   On: 06/22/2020 11:43   ECHOCARDIOGRAM COMPLETE  Result Date: 06/23/2020    ECHOCARDIOGRAM REPORT   Patient Name:   Autumn [name] Date of Exam: 06/23/2020 Medical Rec #:  [id number]            Height:       70.0 in Accession #:    [id number]           Weight:       245.0 lb Date of Birth:  1985-11-04            BSA:          2.275 m Patient Age:    34 years             BP:           112/80 mmHg Patient Gender: F                    HR:           78 bpm. Exam Location:  [facility] Procedure: 2D Echo, Color Doppler and Cardiac Doppler Indications:     I26.00 Pulmonary Embolus  History:         Patient has no prior history of Echocardiogram examinations. Pt                  tested positive for COVID-19 on 06/08/20.  Sonographer:     Charmayne Sheer RDCS (AE) Referring Phys:  Baker Janus Soledad Gerlach NIU Diagnosing Phys: Ida Rogue MD  Sonographer Comments: Suboptimal apical window and no subcostal window. Image acquisition challenging due to patient body habitus. IMPRESSIONS  1. Left ventricular ejection fraction, by estimation, is 55 to 60%. The left ventricle has normal function. The left ventricle has no regional wall motion abnormalities. Left ventricular diastolic parameters were normal.  2. Right ventricular systolic function is normal. The right ventricular size is normal.  3. The inferior vena cava is normal in size with greater than 50% respiratory variability, suggesting right atrial pressure of 3 mmHg. FINDINGS  Left  Ventricle: Left ventricular ejection fraction, by estimation, is 55 to 60%. The left ventricle has normal function. The left ventricle has no regional wall motion abnormalities.  The left ventricular internal cavity size was normal in size. There is  no left ventricular hypertrophy. Left ventricular diastolic parameters were normal. Right Ventricle: The right ventricular size is normal. No increase in right ventricular wall thickness. Right ventricular systolic function is normal. Left Atrium: Left atrial size was normal in size. Right Atrium: Right atrial size was normal in size. Pericardium: There is no evidence of pericardial effusion. Mitral Valve: The mitral valve is normal in structure. No evidence of mitral valve regurgitation. No evidence of mitral valve stenosis. MV peak gradient, 2.5 mmHg. The mean mitral valve gradient is 1.0 mmHg. Tricuspid Valve: The tricuspid valve is normal in structure. Tricuspid valve regurgitation is mild . No evidence of tricuspid stenosis. Aortic Valve: The aortic valve was not well visualized. Aortic valve regurgitation is not visualized. No aortic stenosis is present. Aortic valve mean gradient measures 3.0 mmHg. Aortic valve peak gradient measures 5.3 mmHg. Aortic valve area, by VTI measures 1.96 cm. Pulmonic Valve: The pulmonic valve was normal in structure. Pulmonic valve regurgitation is not visualized. No evidence of pulmonic stenosis. Aorta: The aortic root is normal in size and structure. Venous: The inferior vena cava is normal in size with greater than 50% respiratory variability, suggesting right atrial pressure of 3 mmHg. IAS/Shunts: No atrial level shunt detected by color flow Doppler.  LEFT VENTRICLE PLAX 2D LVIDd:         4.00 cm     Diastology LVIDs:         3.20 cm     LV e' medial:    8.38 cm/s LV PW:         0.90 cm     LV E/e' medial:  8.2 LV IVS:        0.60 cm     LV e' lateral:   12.00 cm/s LVOT diam:     1.90 cm     LV E/e' lateral: 5.8 LV SV:         42  LV SV Index:   18 LVOT Area:     2.84 cm  LV Volumes (MOD) LV vol d, MOD A2C: 58.4 ml LV vol d, MOD A4C: 90.0 ml LV vol s, MOD A2C: 18.2 ml LV vol s, MOD A4C: 28.6 ml LV SV MOD A2C:     40.2 ml LV SV MOD A4C:     90.0 ml LV SV MOD BP:      50.1 ml LEFT ATRIUM             Index LA diam:        3.20 cm 1.41 cm/m LA Vol (A2C):   26.8 ml 11.78 ml/m LA Vol (A4C):   20.1 ml 8.83 ml/m LA Biplane Vol: 24.0 ml 10.55 ml/m  AORTIC VALVE                   PULMONIC VALVE AV Area (Vmax):    1.99 cm    PV Vmax:       0.91 m/s AV Area (Vmean):   2.06 cm    PV Vmean:      63.400 cm/s AV Area (VTI):     1.96 cm    PV VTI:        0.177 m AV Vmax:           115.00 cm/s PV Peak grad:  3.3 mmHg AV Vmean:          77.800 cm/s PV Mean grad:  2.0 mmHg AV VTI:  0.214 m AV Peak Grad:      5.3 mmHg AV Mean Grad:      3.0 mmHg LVOT Vmax:         80.90 cm/s LVOT Vmean:        56.600 cm/s LVOT VTI:          0.148 m LVOT/AV VTI ratio: 0.69  AORTA Ao Root diam: 2.90 cm MITRAL VALVE MV Area (PHT): 5.70 cm    SHUNTS MV Area VTI:   2.69 cm    Systemic VTI:  0.15 m MV Peak grad:  2.5 mmHg    Systemic Diam: 1.90 cm MV Mean grad:  1.0 mmHg MV Vmax:       0.79 m/s MV Vmean:      50.5 cm/s MV Decel Time: 133 msec MV E velocity: 69.00 cm/s MV A velocity: 41.10 cm/s MV E/A ratio:  1.68 Ida Rogue MD Electronically signed by Ida Rogue MD Signature Date/Time: 06/23/2020/11:43:41 AM    Final         Scheduled Meds: . acetaminophen  1,000 mg Oral Once  . apixaban  10 mg Oral BID  . escitalopram  15 mg Oral Daily   Continuous Infusions:    LOS: 0 days    Time spent: 34 mins     Wyvonnia Dusky, MD [facility] Pager [phone]  If 7PM-7AM, please contact night-coverage 06/24/2020, 7:32 AM

## 2020-06-25 ENCOUNTER — Other Ambulatory Visit: Payer: Self-pay | Admitting: Internal Medicine

## 2020-06-25 DIAGNOSIS — F329 Major depressive disorder, single episode, unspecified: Secondary | ICD-10-CM | POA: Diagnosis not present

## 2020-06-25 DIAGNOSIS — F3289 Other specified depressive episodes: Secondary | ICD-10-CM | POA: Diagnosis not present

## 2020-06-25 DIAGNOSIS — I2694 Multiple subsegmental pulmonary emboli without acute cor pulmonale: Secondary | ICD-10-CM | POA: Diagnosis not present

## 2020-06-25 DIAGNOSIS — Z7982 Long term (current) use of aspirin: Secondary | ICD-10-CM | POA: Diagnosis not present

## 2020-06-25 DIAGNOSIS — F32A Depression, unspecified: Secondary | ICD-10-CM | POA: Diagnosis not present

## 2020-06-25 DIAGNOSIS — I7090 Unspecified atherosclerosis: Secondary | ICD-10-CM | POA: Diagnosis not present

## 2020-06-25 DIAGNOSIS — F419 Anxiety disorder, unspecified: Secondary | ICD-10-CM | POA: Diagnosis not present

## 2020-06-25 DIAGNOSIS — Z87891 Personal history of nicotine dependence: Secondary | ICD-10-CM | POA: Diagnosis not present

## 2020-06-25 DIAGNOSIS — R0602 Shortness of breath: Secondary | ICD-10-CM | POA: Diagnosis not present

## 2020-06-25 DIAGNOSIS — I2699 Other pulmonary embolism without acute cor pulmonale: Secondary | ICD-10-CM | POA: Diagnosis not present

## 2020-06-25 DIAGNOSIS — K760 Fatty (change of) liver, not elsewhere classified: Secondary | ICD-10-CM | POA: Diagnosis not present

## 2020-06-25 DIAGNOSIS — Z20822 Contact with and (suspected) exposure to covid-19: Secondary | ICD-10-CM | POA: Diagnosis not present

## 2020-06-25 DIAGNOSIS — G43701 Chronic migraine without aura, not intractable, with status migrainosus: Secondary | ICD-10-CM | POA: Diagnosis not present

## 2020-06-25 LAB — CBC
HCT: 36.7 % (ref 36.0–46.0)
Hemoglobin: 12.8 g/dL (ref 12.0–15.0)
MCH: 30.4 pg (ref 26.0–34.0)
MCHC: 34.9 g/dL (ref 30.0–36.0)
MCV: 87.2 fL (ref 80.0–100.0)
Platelets: 295 10*3/uL (ref 150–400)
RBC: 4.21 MIL/uL (ref 3.87–5.11)
RDW: 12 % (ref 11.5–15.5)
WBC: 8.6 10*3/uL (ref 4.0–10.5)
nRBC: 0 % (ref 0.0–0.2)

## 2020-06-25 LAB — BASIC METABOLIC PANEL
Anion gap: 14 (ref 5–15)
BUN: 10 mg/dL (ref 6–20)
CO2: 21 mmol/L — ABNORMAL LOW (ref 22–32)
Calcium: 8.9 mg/dL (ref 8.9–10.3)
Chloride: 103 mmol/L (ref 98–111)
Creatinine, Ser: 0.66 mg/dL (ref 0.44–1.00)
GFR, Estimated: >60 mL/min (ref >60)
Glucose, Bld: 103 mg/dL — ABNORMAL HIGH (ref 70–99)
Potassium: 4.3 mmol/L (ref 3.5–5.1)
Sodium: 138 mmol/L (ref 135–145)

## 2020-06-25 LAB — CARDIOLIPIN ANTIBODIES, IGG, IGM, IGA
Anticardiolipin IgA: <9 APL U/mL (ref 0–11)
Anticardiolipin IgG: <9 GPL U/mL (ref 0–14)
Anticardiolipin IgM: <9 MPL U/mL (ref 0–12)

## 2020-06-25 LAB — BETA-2-GLYCOPROTEIN I ABS, IGG/M/A
Beta-2 Glyco I IgG: <9 GPI IgG units (ref 0–20)
Beta-2-Glycoprotein I IgA: <9 GPI IgA units (ref 0–25)
Beta-2-Glycoprotein I IgM: <9 GPI IgM units (ref 0–32)

## 2020-06-25 MED ORDER — TRAMADOL HCL 50 MG PO TABS: 50.0000 mg | ORAL_TABLET | Freq: Four times a day (QID) | ORAL | 0 refills | Status: AC | PRN

## 2020-06-25 MED ORDER — APIXABAN 5 MG PO TABS: 10.0000 mg | ORAL_TABLET | Freq: Two times a day (BID) | ORAL | 0 refills | Status: DC

## 2020-06-25 MED ORDER — CYCLOBENZAPRINE HCL 5 MG PO TABS: 5.0000 mg | ORAL_TABLET | Freq: Two times a day (BID) | ORAL | 0 refills | Status: AC | PRN

## 2020-06-25 MED ORDER — APIXABAN 5 MG PO TABS: 5.0000 mg | ORAL_TABLET | Freq: Two times a day (BID) | ORAL | 0 refills | Status: DC

## 2020-06-25 MED ORDER — APIXABAN 5 MG PO TABS
10.0000 mg | ORAL_TABLET | Freq: Two times a day (BID) | ORAL | 0 refills | Status: DC
Start: 2020-06-25 — End: 2020-07-08

## 2020-06-25 NOTE — Plan of Care (Signed)
Discharge order received. Patient mental status is at baseline. Vital signs stable . No signs of acute distress. Discharge instructions given. Patient verbalized understanding. No other issues noted at this time.   

## 2020-06-25 NOTE — Discharge Summary (Signed)
Physician Discharge Summary  Autumn [name] [id number] DOB: 01-Jan-1986 DOA: 06/22/2020  PCP: Pleas Koch, NP  Admit date: 06/22/2020 Discharge date: 06/25/2020  Admitted From: home Disposition: home  Recommendations for Outpatient Follow-up:  1. Follow up with PCP in 1-2 weeks & will need f/u on hypercoagulable work up  [place]: no  Equipment/Devices:  Discharge Condition: stable CODE STATUS: full  Diet recommendation: Regular  Brief/Interim Summary: HPI was taken from Dr. Blaine Hamper: Autumn [name] is a 35 y.o. female with medical history significant of GERD, depression, anxiety, migraine headache, scoliosis, shingles, ovarian cyst, lumbar disc disease, who presents with left flank and upper left lower chest pain  Patient states that her pain started yesterday morning, mainly in left flank area, also involving left lower chest.  The pain is constant, sharp, 7-10 out of 10 in severity, pleuritic, aggravated with deep breaths and inspiration.  Patient does not have cough, no shortness of breath.  No fever or chills.  Does not have nausea or vomiting or diarrhea.  Of note, patient states that she had positive COVID test 12 days ago (no test result available).  initially she just had some runny nose, no fever, chills, shortness breath, chest pain or cough.  Currently no cough, shortness of breath, fever or chills. Patient states that she has chronic bladder issue. She is supposed to have cystoscopy on next Monday with urology.  Currently denies symptoms of UTI. Pt states that she is taking OCP currently.    ED Course: pt was found to have WBC 12.9, pending COVID PCR, urinalysis (cloudy appearance, large amount of leukocyte, many bacteria, squamous cell 6/10, WBC 21-50), troponin level 2, electrolytes renal function okay, temperature normal, blood pressure 110/65, heart rate 71, RR 23, oxygen 793-95% on room air.  Chest x-ray is not impressive.  CT scan of  abdomen/pelvis negative for acute issues.  Lower extremity Dopplers negative for DVT.  CT angiogram of chest that showed bilateral PE without CT evidence of right heart strain. Pt is placed on [facility] Course from Dr. Jimmye Norman 1/13-1/15/22: Pt was found to have b/l pulmonary embolism w/ developing pulmonary infarct of unknown etiology (recent COVID19 infection vs birth control pills vs hypercoagulable disorder). Pt was taking hormonal birth control which has since been discontinued. Pt was initially placed on IV heparin and then transitioned to po eliquis prior to d/c. Echo was done and showed EF 33-29%, normal diastolic function, no right heart strain & no shunts noted. [place] of b/l LE was neg for DVT. Hypercoagulable work-up was still pending at the time of d/c and pt will f/u w/ PCP in 1-2 weeks to follow-up on those results. Pt was told not take any NSAIDs while taking eliquis and to be very cautious when taking excedrin migraine w/ eliquis. Pt will remain on eliquis for at least 3 months. Pt was made aware of increased risk of bleeding when taking eliquis and signs/symptoms to watch out for to know when to come back to the ER. Pt verbalized her understanding.   Discharge Diagnoses:  Principal Problem:   Bilateral pulmonary embolism (HCC) Active Problems:   Anxiety and depression   Migraine   Lung nodule   Leukocytosis  Bilateral pulmonary embolism: w/ developing pulmonary infarct. Etiology unclear, recent COVID19 infection vs birth control vs hypercoagulable disorder. D/c IV heparin drip and continue on eliquis 10mg  BID x 7 days then 5mg  BID x 3 months at least. Echo shows EF 51-88%, normal diastolic function, no right  heart strain & no shunts. B/l LE [place] was neg for DVT. Hypercoagulation work-up is pending. Tramadol, flexeril prn. Was taking hormonal birth control at home, discontinue birth control    Depression: severity unknown. Continue on home dose of lexapro   Migraine: continue on  home dose of excedrin prn   Lung nodule: stable 26mm right middle lobe nodule, compatible w/ benign nodule as per rads   Leukocytosis: resolved   Asymptomatic bacteriuria:pt supposed to get cystoscopy on Monday. No GU symptoms currently. Will hold off on abxs for now. Urine cx shows containment & repeat urine cx ordered  Recent covid 19 infection: self-reported positive COVID-19 12 days ago. Repeat COVID19 here is neg  Obesity: BMI 35. Complicates overall care & prognosis   Discharge Instructions  Discharge Instructions    Diet general   Complete by: As directed    Discharge instructions   Complete by: As directed    F/u w/ PCP in 1 week & will need to follow-up of hypercoagulable lab results and if positive will need to see a hematologist   Increase activity slowly   Complete by: As directed      Allergies as of 06/25/2020      Reactions   Imitrex [sumatriptan] Other (See Comments)   Vomiting, Slurred Speech and Facial Numbness      Medication List    STOP taking these medications   ALAYCEN 1/35 tablet Generic drug: norethindrone-ethinyl estradiol 1/35   ibuprofen 200 MG tablet Commonly known as: ADVIL     TAKE these medications   apixaban 5 MG Tabs tablet Commonly known as: ELIQUIS Take 2 tablets (10 mg total) by mouth 2 (two) times daily for 6 days.   apixaban 5 MG Tabs tablet Commonly known as: ELIQUIS Take 2 tablets (10 mg total) by mouth 2 (two) times daily for 3 days.   apixaban 5 MG Tabs tablet Commonly known as: ELIQUIS Take 1 tablet (5 mg total) by mouth 2 (two) times daily. Only start taking this script after completing eliquis 10mg  BID x 7 days Start taking on: July 01, 2020   aspirin-acetaminophen-caffeine 250-250-65 MG tablet Commonly known as: EXCEDRIN MIGRAINE Take by mouth every 6 (six) hours as needed for headache.   cyclobenzaprine 5 MG tablet Commonly known as: FLEXERIL Take 1 tablet (5 mg total) by mouth 2 (two) times daily as  needed for up to 3 days for muscle spasms.   escitalopram 10 MG tablet Commonly known as: Lexapro Take 1.5 tablets (15 mg total) by mouth daily.   pantoprazole 40 MG tablet Commonly known as: Protonix Take 1 tablet (40 mg total) by mouth daily.   propranolol 10 MG tablet Commonly known as: INDERAL Take 1 tablet (10 mg total) by mouth 2 (two) times daily as needed. For anxiety   sucralfate 1 g tablet Commonly known as: Carafate Take 1 tablet (1 g total) by mouth 4 (four) times daily for 5 days.   traMADol 50 MG tablet Commonly known as: ULTRAM Take 1 tablet (50 mg total) by mouth every 6 (six) hours as needed for up to 5 days for moderate pain or severe pain.   valACYclovir 1000 MG tablet Commonly known as: VALTREX       Allergies  Allergen Reactions  . Imitrex [Sumatriptan] Other (See Comments)    Vomiting, Slurred Speech and Facial Numbness    Consultations:     Procedures/Studies: DG Chest 2 View  Result Date: 06/21/2020 CLINICAL DATA:  Chest pain. EXAM: CHEST -  2 VIEW COMPARISON:  03/03/2019. FINDINGS: Mediastinum and hilar structures normal. Heart size normal. Low lung volumes. A minimal infiltrate projected over the lower lobes on lateral view cannot be completely excluded. No pleural effusion. No pneumothorax. Mild thoracic spine scoliosis. No acute bony abnormality. IMPRESSION: It minimal infiltrate projected over the lower lobes on lateral view cannot be completely excluded. Electronically Signed   By: Marcello Moores  Register   On: 06/21/2020 07:28   CT Angio Chest PE W and/or Wo Contrast  Result Date: 06/22/2020 CLINICAL DATA:  Pain radiating to LEFT side, pain with inspiration and movement. EXAM: CT ANGIOGRAPHY CHEST WITH CONTRAST TECHNIQUE: Multidetector CT imaging of the chest was performed using the standard protocol during bolus administration of intravenous contrast. Multiplanar CT image reconstructions and MIPs were obtained to evaluate the vascular anatomy.  CONTRAST:  113mL OMNIPAQUE IOHEXOL 350 MG/ML SOLN COMPARISON:  August 16, 2018 FINDINGS: Cardiovascular: Normal caliber thoracic aorta. The heart size top normal without pericardial effusion. RV to LV ratio of 0.88. Central pulmonary vasculature is normal caliber. Segmental and subsegmental filling defects RIGHT posteromedial lower lobe. Filling posterior segmental distally and extending into subsegmental branches. Images 136 through 148 of series 5. Segmental filling defect in the lateral segment of the LEFT lower lobe as well. Segmental filling defect in the LEFT upper lobe posterior segmental branch, apicoposterior segmental branch. This is occlusive and seen on images 135 through 146 no central filling defect. Filling defect in subsegmental branches in the RIGHT middle lobe involving both medial and lateral RIGHT middle lobe on images 119 and 128 and image 124 of series 5 Mediastinum/Nodes: No thoracic inlet adenopathy. No axillary lymphadenopathy. No mediastinal lymphadenopathy. Esophagus grossly normal. No hilar lymphadenopathy. Lungs/Pleura: 5 mm RIGHT middle lobe pulmonary nodule is unchanged. Basilar volume loss and ground-glass attenuation worse in the LEFT lower lobe. Airways are patent. Upper Abdomen: Hepatic steatosis. No pericholecystic stranding. Gallbladder liver incompletely imaged. Spine select spine imaged portions of pancreas, spleen and adrenal glands are normal. No acute gastrointestinal process on very limited assessment of the upper abdomen. Musculoskeletal: No acute or destructive bone finding. Review of the MIP images confirms the above findings. IMPRESSION: 1. Bilateral submental and subsegmental pulmonary emboli. 2. RV to LV ratio at the upper range of normal just below what is considered for RIGHT heart strain. Configuration of the heart and RV to LV ratio is actually unchanged since March of 2020. Attention for any symptoms that would suggest cardiac dysfunction with echocardiography as  warranted. 3. Basilar atelectasis with developing pulmonary infarct in the LEFT lower lobe. 4. Stable 5 mm RIGHT middle lobe pulmonary nodule. Stability over greater than 1 years time compatible with benign nodule. 5. Hepatic steatosis. 6. Aortic atherosclerosis. Critical Value/emergent results were called by telephone at the time of interpretation on 06/22/2020 at 10:24 am to provider [facility] , who verbally acknowledged these results. Aortic Atherosclerosis (ICD10-I70.0). Electronically Signed   By: Zetta Bills M.D.   On: 06/22/2020 10:24   CT ABDOMEN PELVIS W CONTRAST  Result Date: 06/22/2020 CLINICAL DATA:  35 year old female with abdominal pain. EXAM: CT ABDOMEN AND PELVIS WITH CONTRAST TECHNIQUE: Multidetector CT imaging of the abdomen and pelvis was performed using the standard protocol following bolus administration of intravenous contrast. CONTRAST:  128mL OMNIPAQUE IOHEXOL 350 MG/ML SOLN in conjunction with contrast enhanced imaging of the chest reported separately. COMPARISON:  CTA chest today reported separately. FINDINGS: Lower chest: See chest CTA reported separately today. Hepatobiliary: Evidence of hepatic steatosis, otherwise negative. Pancreas: Negative.  Spleen: Negative. Adrenals/Urinary Tract: Normal adrenal glands. Renal enhancement is symmetric, kidneys are within normal limits. No nephrolithiasis. Decompressed ureters. Diminutive and negative urinary bladder. Stomach/Bowel: Large bowel appears negative, with diminutive or absent appendix. Normal terminal ileum. No dilated small bowel. Decompressed stomach and duodenum. No free air, free fluid, mesenteric stranding. Vascular/Lymphatic: Major arterial structures in the abdomen and pelvis appear patent and normal. Portal venous system is patent. No lymphadenopathy. Reproductive: Negative. Other: No pelvic free fluid. Musculoskeletal: Negative. IMPRESSION: 1. No acute or inflammatory process identified in the abdomen or pelvis. 2. See  also Chest CTA reported separately. 3. Hepatic steatosis. Electronically Signed   By: Genevie Ann M.D.   On: 06/22/2020 10:02   US Venous Img Lower Bilateral (DVT)  Result Date: 06/22/2020 CLINICAL DATA:  BILATERAL pulmonary emboli EXAM: BILATERAL LOWER EXTREMITY VENOUS DOPPLER ULTRASOUND TECHNIQUE: Gray-scale sonography with compression, as well as color and duplex ultrasound, were performed to evaluate the deep venous system(s) from the level of the common femoral vein through the popliteal and proximal calf veins. COMPARISON:  None FINDINGS: VENOUS Normal compressibility of the BILATERAL common femoral, superficial femoral, and popliteal veins, as well as the visualized calf veins. Visualized portions of BILATERAL profunda femoral veins and great saphenous veins are unremarkable. No filling defects to suggest DVT on grayscale or color Doppler imaging. Doppler waveforms show normal direction of venous flow, normal respiratory phasicity and response to augmentation. OTHER None. Limitations: none IMPRESSION: No evidence of deep venous thrombosis in either lower extremity. Electronically Signed   By: Lavonia Dana M.D.   On: 06/22/2020 11:43   ECHOCARDIOGRAM COMPLETE  Result Date: 06/23/2020    ECHOCARDIOGRAM REPORT   Patient Name:   Autumn [name] Date of Exam: 06/23/2020 Medical Rec #:  [id number]            Height:       70.0 in Accession #:    [id number]           Weight:       245.0 lb Date of Birth:  09/13/1985            BSA:          2.275 m Patient Age:    34 years             BP:           112/80 mmHg Patient Gender: F                    HR:           78 bpm. Exam Location:  [facility] Procedure: 2D Echo, Color Doppler and Cardiac Doppler Indications:     I26.00 Pulmonary Embolus  History:         Patient has no prior history of Echocardiogram examinations. Pt                  tested positive for COVID-19 on 06/08/20.  Sonographer:     Charmayne Sheer RDCS (AE) Referring Phys:  Baker Janus Soledad Gerlach NIU Diagnosing Phys:  Ida Rogue MD  Sonographer Comments: Suboptimal apical window and no subcostal window. Image acquisition challenging due to patient body habitus. IMPRESSIONS  1. Left ventricular ejection fraction, by estimation, is 55 to 60%. The left ventricle has normal function. The left ventricle has no regional wall motion abnormalities. Left ventricular diastolic parameters were normal.  2. Right ventricular systolic function is normal. The right ventricular size is normal.  3. The inferior vena cava  is normal in size with greater than 50% respiratory variability, suggesting right atrial pressure of 3 mmHg. FINDINGS  Left Ventricle: Left ventricular ejection fraction, by estimation, is 55 to 60%. The left ventricle has normal function. The left ventricle has no regional wall motion abnormalities. The left ventricular internal cavity size was normal in size. There is  no left ventricular hypertrophy. Left ventricular diastolic parameters were normal. Right Ventricle: The right ventricular size is normal. No increase in right ventricular wall thickness. Right ventricular systolic function is normal. Left Atrium: Left atrial size was normal in size. Right Atrium: Right atrial size was normal in size. Pericardium: There is no evidence of pericardial effusion. Mitral Valve: The mitral valve is normal in structure. No evidence of mitral valve regurgitation. No evidence of mitral valve stenosis. MV peak gradient, 2.5 mmHg. The mean mitral valve gradient is 1.0 mmHg. Tricuspid Valve: The tricuspid valve is normal in structure. Tricuspid valve regurgitation is mild . No evidence of tricuspid stenosis. Aortic Valve: The aortic valve was not well visualized. Aortic valve regurgitation is not visualized. No aortic stenosis is present. Aortic valve mean gradient measures 3.0 mmHg. Aortic valve peak gradient measures 5.3 mmHg. Aortic valve area, by VTI measures 1.96 cm. Pulmonic Valve: The pulmonic valve was normal in structure.  Pulmonic valve regurgitation is not visualized. No evidence of pulmonic stenosis. Aorta: The aortic root is normal in size and structure. Venous: The inferior vena cava is normal in size with greater than 50% respiratory variability, suggesting right atrial pressure of 3 mmHg. IAS/Shunts: No atrial level shunt detected by color flow Doppler.  LEFT VENTRICLE PLAX 2D LVIDd:         4.00 cm     Diastology LVIDs:         3.20 cm     LV e' medial:    8.38 cm/s LV PW:         0.90 cm     LV E/e' medial:  8.2 LV IVS:        0.60 cm     LV e' lateral:   12.00 cm/s LVOT diam:     1.90 cm     LV E/e' lateral: 5.8 LV SV:         42 LV SV Index:   18 LVOT Area:     2.84 cm  LV Volumes (MOD) LV vol d, MOD A2C: 58.4 ml LV vol d, MOD A4C: 90.0 ml LV vol s, MOD A2C: 18.2 ml LV vol s, MOD A4C: 28.6 ml LV SV MOD A2C:     40.2 ml LV SV MOD A4C:     90.0 ml LV SV MOD BP:      50.1 ml LEFT ATRIUM             Index LA diam:        3.20 cm 1.41 cm/m LA Vol (A2C):   26.8 ml 11.78 ml/m LA Vol (A4C):   20.1 ml 8.83 ml/m LA Biplane Vol: 24.0 ml 10.55 ml/m  AORTIC VALVE                   PULMONIC VALVE AV Area (Vmax):    1.99 cm    PV Vmax:       0.91 m/s AV Area (Vmean):   2.06 cm    PV Vmean:      63.400 cm/s AV Area (VTI):     1.96 cm    PV VTI:  0.177 m AV Vmax:           115.00 cm/s PV Peak grad:  3.3 mmHg AV Vmean:          77.800 cm/s PV Mean grad:  2.0 mmHg AV VTI:            0.214 m AV Peak Grad:      5.3 mmHg AV Mean Grad:      3.0 mmHg LVOT Vmax:         80.90 cm/s LVOT Vmean:        56.600 cm/s LVOT VTI:          0.148 m LVOT/AV VTI ratio: 0.69  AORTA Ao Root diam: 2.90 cm MITRAL VALVE MV Area (PHT): 5.70 cm    SHUNTS MV Area VTI:   2.69 cm    Systemic VTI:  0.15 m MV Peak grad:  2.5 mmHg    Systemic Diam: 1.90 cm MV Mean grad:  1.0 mmHg MV Vmax:       0.79 m/s MV Vmean:      50.5 cm/s MV Decel Time: 133 msec MV E velocity: 69.00 cm/s MV A velocity: 41.10 cm/s MV E/A ratio:  1.68 Ida Rogue MD Electronically  signed by Ida Rogue MD Signature Date/Time: 06/23/2020/11:43:41 AM    Final       Subjective: Pt c/o left & right sided anterior wall pain & back pain but improved from day prior.   Discharge Exam: Vitals:   06/25/20 0432 06/25/20 0752  BP: 103/78 110/67  Pulse: 72 64  Resp: 16   Temp: 98 F (36.7 C) 97.9 F (36.6 C)  SpO2: 98% 95%   Vitals:   06/24/20 1940 06/24/20 2339 06/25/20 0432 06/25/20 0752  BP: 119/77 105/69 103/78 110/67  Pulse: 87 74 72 64  Resp: 18 18 16    Temp: 98.5 F (36.9 C) 98.3 F (36.8 C) 98 F (36.7 C) 97.9 F (36.6 C)  TempSrc: Oral Oral Oral Oral  SpO2: 96% 99% 98% 95%  Weight:      Height:        General: Pt is alert, awake, not in acute distress Cardiovascular: S1/S2 +, no rubs, no gallops Respiratory: diminished breath sounds b/l otherwise clear  Abdominal: Soft, NT, obese, bowel sounds + Extremities:  no cyanosis    The results of significant diagnostics from this hospitalization (including imaging, microbiology, ancillary and laboratory) are listed below for reference.     Microbiology: Recent Results (from the past 240 hour(s))  Urine Culture     Status: Abnormal   Collection Time: 06/22/20  8:48 AM   Specimen: Urine, Clean Catch  Result Value Ref Range Status   Specimen Description   Final    URINE, CLEAN CATCH Performed at [facility], [address] [place], [place] [address]    Special Requests   Final    NONE Performed at [facility], [address] [place], [place] [address]    Culture MULTIPLE SPECIES PRESENT, SUGGEST RECOLLECTION (A)  Final   Report Status 06/23/2020 FINAL  Final  SARS CORONAVIRUS 2 (TAT 6-24 HRS) Nasopharyngeal Nasopharyngeal Swab     Status: None   Collection Time: 06/22/20 11:19 AM   Specimen: Nasopharyngeal Swab  Result Value Ref Range Status   SARS Coronavirus 2 NEGATIVE NEGATIVE Final    Comment: (NOTE) SARS-CoV-2 target nucleic acids are NOT DETECTED.  The  SARS-CoV-2 RNA is generally detectable in upper and lower respiratory specimens during the acute  phase of infection. Negative results do not preclude SARS-CoV-2 infection, do not rule out co-infections with other pathogens, and should not be used as the sole basis for treatment or other patient management decisions. Negative results must be combined with clinical observations, patient history, and epidemiological information. The expected result is Negative.  Fact Sheet for Patients: SugarRoll.be  Fact Sheet for Healthcare Providers: https://www.woods-mathews.com/  This test is not yet approved or cleared by the [place] FDA and  has been authorized for detection and/or diagnosis of SARS-CoV-2 by FDA under an Emergency Use Authorization (EUA). This EUA will remain  in effect (meaning this test can be used) for the duration of the COVID-19 declaration under Se ction 564(b)(1) of the Act, 21 U.S.C. section 360bbb-3(b)(1), unless the authorization is terminated or revoked sooner.  Performed at [facility], [place] [address] [place], [place] [address]      Labs: BNP (last 3 results) Recent Labs    06/22/20 0657  BNP [id number]   Basic Metabolic Panel: Recent Labs  Lab 06/21/20 0657 06/22/20 0615 06/23/20 0500 06/24/20 0655 06/25/20 0353  NA 138 140 136 137 138  K 4.3 3.9 3.8 4.1 4.3  CL 105 105 100 102 103  CO2 20* 23 25 25  21*  GLUCOSE 101* 111* 111* 107* 103*  BUN 13 13 11 9 10   CREATININE 0.75 0.75 0.63 0.69 0.66  CALCIUM 9.2 9.2 9.3 8.7* 8.9   Liver Function Tests: Recent Labs  Lab 06/21/20 0657 06/22/20 0615  AST 21 19  ALT 23 25  ALKPHOS 48 52  BILITOT 0.7 0.7  PROT 7.6 7.3  ALBUMIN 3.9 3.7   No results for input(s): LIPASE, AMYLASE in the last 168 hours. No results for input(s): AMMONIA in the last 168 hours. CBC: Recent Labs  Lab 06/21/20 0657 06/22/20 0615 06/23/20 0500 06/24/20 0655  06/25/20 0353  WBC 9.0 12.9* 8.9 9.3 8.6  NEUTROABS 5.3  --   --   --   --   HGB 13.9 13.9 13.6 12.7 12.8  HCT 40.3 40.0 40.3 37.0 36.7  MCV 87.8 88.9 88.8 89.2 87.2  PLT 305 306 320 287 295   Cardiac Enzymes: No results for input(s): CKTOTAL, CKMB, CKMBINDEX, TROPONINI in the last 168 hours. BNP: Invalid input(s): POCBNP CBG: No results for input(s): GLUCAP in the last 168 hours. D-Dimer No results for input(s): DDIMER in the last 72 hours. Hgb A1c No results for input(s): HGBA1C in the last 72 hours. Lipid Profile No results for input(s): CHOL, HDL, LDLCALC, TRIG, CHOLHDL, LDLDIRECT in the last 72 hours. Thyroid function studies No results for input(s): TSH, T4TOTAL, T3FREE, THYROIDAB in the last 72 hours.  Invalid input(s): FREET3 Anemia work up No results for input(s): VITAMINB12, FOLATE, FERRITIN, TIBC, IRON, RETICCTPCT in the last 72 hours. Urinalysis    Component Value Date/Time   COLORURINE YELLOW (A) 06/22/2020 0848   APPEARANCEUR CLOUDY (A) 06/22/2020 0848   APPEARANCEUR Cloudy (A) 07/06/2019 0911   LABSPEC 1.024 06/22/2020 0848   LABSPEC 1.030 12/30/2011 0051   PHURINE 5.0 06/22/2020 0848   GLUCOSEU NEGATIVE 06/22/2020 0848   GLUCOSEU Negative 12/30/2011 0051   HGBUR MODERATE (A) 06/22/2020 0848   BILIRUBINUR NEGATIVE 06/22/2020 0848   BILIRUBINUR Negative 07/06/2019 0911   BILIRUBINUR Negative 12/30/2011 0051   KETONESUR NEGATIVE 06/22/2020 0848   PROTEINUR NEGATIVE 06/22/2020 0848   UROBILINOGEN 0.2 05/18/2019 1304   NITRITE NEGATIVE 06/22/2020 0848   LEUKOCYTESUR LARGE (A) 06/22/2020 0848   LEUKOCYTESUR 3+  12/30/2011 0051   Sepsis Labs Invalid input(s): PROCALCITONIN,  WBC,  LACTICIDVEN Microbiology Recent Results (from the past 240 hour(s))  Urine Culture     Status: Abnormal   Collection Time: 06/22/20  8:48 AM   Specimen: Urine, Clean Catch  Result Value Ref Range Status   Specimen Description   Final    URINE, CLEAN CATCH Performed at  [facility], [address] [place], [place] [address]    Special Requests   Final    NONE Performed at [facility], [address] [place], [place] [address]    Culture MULTIPLE SPECIES PRESENT, SUGGEST RECOLLECTION (A)  Final   Report Status 06/23/2020 FINAL  Final  SARS CORONAVIRUS 2 (TAT 6-24 HRS) Nasopharyngeal Nasopharyngeal Swab     Status: None   Collection Time: 06/22/20 11:19 AM   Specimen: Nasopharyngeal Swab  Result Value Ref Range Status   SARS Coronavirus 2 NEGATIVE NEGATIVE Final    Comment: (NOTE) SARS-CoV-2 target nucleic acids are NOT DETECTED.  The SARS-CoV-2 RNA is generally detectable in upper and lower respiratory specimens during the acute phase of infection. Negative results do not preclude SARS-CoV-2 infection, do not rule out co-infections with other pathogens, and should not be used as the sole basis for treatment or other patient management decisions. Negative results must be combined with clinical observations, patient history, and epidemiological information. The expected result is Negative.  Fact Sheet for Patients: SugarRoll.be  Fact Sheet for Healthcare Providers: https://www.woods-mathews.com/  This test is not yet approved or cleared by the [place] FDA and  has been authorized for detection and/or diagnosis of SARS-CoV-2 by FDA under an Emergency Use Authorization (EUA). This EUA will remain  in effect (meaning this test can be used) for the duration of the COVID-19 declaration under Se ction 564(b)(1) of the Act, 21 U.S.C. section 360bbb-3(b)(1), unless the authorization is terminated or revoked sooner.  Performed at [facility], [address], [place], [place] [address]      Time coordinating discharge: Over 30 minutes  SIGNED:   Wyvonnia Dusky, MD  [facility] 06/25/2020, 12:56 PM Pager   If 7PM-7AM, please contact night-coverage

## 2020-06-26 LAB — URINE CULTURE: Culture: <10000 — AB

## 2020-06-27 ENCOUNTER — Other Ambulatory Visit: Payer: 59 | Admitting: Urology

## 2020-06-28 ENCOUNTER — Other Ambulatory Visit: Payer: Self-pay

## 2020-06-28 ENCOUNTER — Encounter: Payer: Self-pay | Admitting: Primary Care

## 2020-06-28 ENCOUNTER — Ambulatory Visit: Payer: 59 | Admitting: Primary Care

## 2020-06-28 VITALS — BP 124/76 | HR 72 | Temp 97.6°F | Ht 70.0 in | Wt 241.0 lb

## 2020-06-28 DIAGNOSIS — R7303 Prediabetes: Secondary | ICD-10-CM | POA: Diagnosis not present

## 2020-06-28 DIAGNOSIS — I2699 Other pulmonary embolism without acute cor pulmonale: Secondary | ICD-10-CM

## 2020-06-28 DIAGNOSIS — Z309 Encounter for contraceptive management, unspecified: Secondary | ICD-10-CM | POA: Diagnosis not present

## 2020-06-28 DIAGNOSIS — R911 Solitary pulmonary nodule: Secondary | ICD-10-CM | POA: Diagnosis not present

## 2020-06-28 LAB — BASIC METABOLIC PANEL
BUN: 10 mg/dL (ref 6–23)
CO2: 24 mEq/L (ref 19–32)
Calcium: 9.8 mg/dL (ref 8.4–10.5)
Chloride: 102 mEq/L (ref 96–112)
Creatinine, Ser: 0.77 mg/dL (ref 0.40–1.20)
GFR: 100.73 mL/min (ref >60.00)
Glucose, Bld: 85 mg/dL (ref 70–99)
Potassium: 4.4 mEq/L (ref 3.5–5.1)
Sodium: 137 mEq/L (ref 135–145)

## 2020-06-28 LAB — LIPID PANEL
Cholesterol: 186 mg/dL (ref 0–200)
HDL: 51 mg/dL (ref >39.00)
LDL Cholesterol: 115 mg/dL — ABNORMAL HIGH (ref 0–99)
NonHDL: 134.67
Total CHOL/HDL Ratio: 4
Triglycerides: 99 mg/dL (ref 0.0–149.0)
VLDL: 19.8 mg/dL (ref 0.0–40.0)

## 2020-06-28 LAB — HEMOGLOBIN A1C: Hgb A1c MFr Bld: 5.8 % (ref 4.6–6.5)

## 2020-06-28 NOTE — Assessment & Plan Note (Signed)
Unchanged from prior imaging, considered benign.

## 2020-06-28 NOTE — Progress Notes (Signed)
Subjective:    Patient ID: Autumn [name], female    DOB: August 12, 1985, 35 y.o.   MRN: [id number]  HPI  This visit occurred during the SARS-CoV-2 public health emergency.  Safety protocols were in place, including screening questions prior to the visit, additional usage of staff PPE, and extensive cleaning of exam room while observing appropriate contact time as indicated for disinfecting solutions.   Autumn [name] is a 35 year old female with a history of migraines, pulmonary nodule, recent Covid-19 infection, recent bilateral pulmonary embolism who presents today for hospital follow-up.  She presented to [facility] ED on 06/21/2020 with reports of left upper quadrant abdominal pain radiating to her left shoulder and left upper extremity.  COVID-19 diagnosis on 06/07/2020.  During this visit chest x-ray showed low lung volumes, minimal infiltrate projected over lower lobes to lateral view.  Labs and EKG without evidence of ACS.  No D-dimer collected or CT angio chest performed despite recent COVID-19 diagnosis and current oral birth control pill regimen.  She was treated for gastritis secondary to NSAID use versus peptic ulcer disease.  She was discharged home.  She notified me of her symptoms in the evening after discharge, arrangements were made for her to have Stat d-dimer drawn the following morning to evaluate for PE.   She returned to [facility] ED the following day with same complaints, provider now mentions left lower anterior chest pain rating to the back and left shoulder.  CT abdomen pelvis without acute cause for symptoms, CT chest positive with bilateral PEs without right heart strain.  She was initiated on IV heparin and admitted for further treatment.  During her hospital stay she underwent testing with hypercoag panel to rule out any metabolic cause for PE's.  She also underwent echocardiogram which showed LVEF of 55 to 60%, normal wall motion, no strain from PEs.  Venous ultrasound negative  for DVTs.  She underwent to urine culture tests given chronic urinary symptoms, both cultures negative. OCP's were discontinued.  She was discharged home on 06/25/20 with a prescription for Eliquis 10 mg BID x 7 days, then 5 mg BID thereafter.   Today she is doing better. She is compliant to her Eliquis and is taking 10 mg BID until Thursday this week. She understands to reduce to 5 mg BID thereafter. She has noticed some bloody sputum with clots since starting Eliquis, mild streaks today. Her chest pain has improved, last dose of Tramadol was two days ago. She did have a tough time with pain management during her hospital stay. She does have a heaviness sensation when laying on her side, bending down which improves with rest. She has noticed some positional dizziness when changing positions, is cognizant of this and is changing positions slowly. Bowel movements are regular over the last three days. She is using her incentive spirometer several times daily, is also doing deep breathing exercises throughout the day.   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 Will return full time on Monday January 24th. Last full day of work was 06/07/20, out from 06/08/20 through 06/14/20, worked 06/15/20 through 06/20/20, out on 06/21/20 through 06/28/20, return on 06/1920 with half days.   Review of Systems  Constitutional: Negative for fever.  HENT:       Blood tinged mucous, see notes  Respiratory: Negative for shortness of breath.   Cardiovascular: Positive  for chest pain.  Skin:       Redness from IV site, improving   Neurological: Positive for light-headedness.       Past Medical History:  Diagnosis Date  . Anxiety   . Back pain    chronic  . Depression   . Disc disease, degenerative, lumbar or lumbosacral   . History of ovarian cyst   . Migraine   . Scoliosis   . Shingles  06/20/2018     Social History   Socioeconomic History  . Marital status: Married    Spouse name: Not on file  . Number of children: 1  . Years of education: Not on file  . Highest education level: Not on file  Occupational History  . Occupation: Transport planner  Tobacco Use  . Smoking status: Former Smoker    Types: Cigarettes    Quit date: 08/18/2016    Years since quitting: 3.8  . Smokeless tobacco: Never Used  . Tobacco comment: 3 cigarettes a day  Vaping Use  . Vaping Use: Never used  Substance and Sexual Activity  . Alcohol use: Yes    Alcohol/week: 0.0 standard drinks    Comment: occasional  . Drug use: No  . Sexual activity: Yes    Birth control/protection: Pill  Other Topics Concern  . Not on file  Social History Narrative  . Not on file   Social Determinants of Health   Financial Resource Strain: Not on file  Food Insecurity: Not on file  Transportation Needs: Not on file  Physical Activity: Not on file  Stress: Not on file  Social Connections: Not on file  Intimate Partner Violence: Not on file    Past Surgical History:  Procedure Laterality Date  . APPENDECTOMY    . BREAST SURGERY Right 2008   lump/ benign    Family History  Problem Relation Age of Onset  . Cancer Mother 31       breast  . Breast cancer Mother 30       and 35  . Alcohol abuse Father   . Vision loss Maternal Grandmother   . Heart disease Maternal Grandmother   . Heart failure Maternal Grandmother   . Cancer Maternal Grandfather   . Alcohol abuse Paternal Grandfather   . Arthritis Maternal Aunt   . Anxiety disorder Maternal Aunt   . Depression Maternal Aunt     Allergies  Allergen Reactions  . Imitrex [Sumatriptan] Other (See Comments)    Vomiting, Slurred Speech and Facial Numbness    Current Outpatient Medications on File Prior to Visit  Medication Sig Dispense Refill  . apixaban (ELIQUIS) 5 MG TABS tablet Take 2 tablets (10 mg total) by mouth 2 (two) times daily for  6 days. 24 tablet 0  . [START ON 07/01/2020] apixaban (ELIQUIS) 5 MG TABS tablet Take 1 tablet (5 mg total) by mouth 2 (two) times daily. Only start taking this script after completing eliquis 10mg  BID x 7 days 60 tablet 0  . aspirin-acetaminophen-caffeine (EXCEDRIN MIGRAINE) 250-250-65 MG tablet Take by mouth every 6 (six) hours as needed for headache.    . cyclobenzaprine (FLEXERIL) 5 MG tablet Take 1 tablet (5 mg total) by mouth 2 (two) times daily as needed for up to 3 days for muscle spasms. 6 tablet 0  . escitalopram (LEXAPRO) 10 MG tablet Take 1.5 tablets (15 mg total) by mouth daily. 45 tablet 1  . propranolol (INDERAL) 10 MG tablet Take 1 tablet (10 mg total) by  mouth 2 (two) times daily as needed. For anxiety 60 tablet 1  . traMADol (ULTRAM) 50 MG tablet Take 1 tablet (50 mg total) by mouth every 6 (six) hours as needed for up to 5 days for moderate pain or severe pain. 20 tablet 0  . valACYclovir (VALTREX) 1000 MG tablet      No current facility-administered medications on file prior to visit.    BP 124/76   Pulse 72   Temp 97.6 F (36.4 C) (Temporal)   Ht 5\' 10"  (1.778 m)   Wt 241 lb (109.3 kg)   SpO2 96%   BMI 34.58 kg/m    Objective:   Physical Exam Constitutional:      Appearance: She is well-nourished.  Cardiovascular:     Rate and Rhythm: Normal rate and regular rhythm.  Pulmonary:     Effort: Pulmonary effort is normal.     Breath sounds: Normal breath sounds.  Musculoskeletal:     Cervical back: Neck supple.  Skin:    General: Skin is warm and dry.  Psychiatric:        Mood and Affect: Mood and affect and mood normal.            Assessment & Plan:

## 2020-06-28 NOTE — Assessment & Plan Note (Addendum)
Repeat A1C and lipid panel pending.

## 2020-06-28 NOTE — Assessment & Plan Note (Signed)
Recent hospital admission for likely provoked bilateral PE's. Also on OCP's at the time.   She seems to be doing better gradually, continue incentive spirometry and deep breathing. Continue Eliquis at 10 mg BID until 7 days, then reduce to 5 mg BID.   Will consult with hematology to get thoughts on how long to remain on anticoagulation, unclear if 3 or 6 months. So far hypercoag testing appears benign. Continue off OCP's.   Hospital notes, labs, imaging reviewed.   Will complete FMLA paper work today, return date of 06/29/20 with half days through 07/01/20, full time resumes 07/04/20.

## 2020-06-28 NOTE — Patient Instructions (Signed)
Stop by the lab prior to leaving today. I will notify you of your results once received.   Continue Eliquis 10 mg twice daily for a total of one week, then decrease to 5 mg twice daily. Notify me when you are running low.  We will complete your FMLA paperwork today.   It was a pleasure to see you today!

## 2020-06-28 NOTE — Assessment & Plan Note (Signed)
OCP's discontinued permanently due to recent hospitalization with bilateral PE's.

## 2020-06-29 LAB — PROTHROMBIN GENE MUTATION

## 2020-06-30 DIAGNOSIS — I2699 Other pulmonary embolism without acute cor pulmonale: Secondary | ICD-10-CM

## 2020-06-30 LAB — FACTOR 5 LEIDEN

## 2020-06-30 NOTE — Progress Notes (Signed)
° °  No charge today.  Patient came in for potential osteopathic manipulation.  With most recent diagnosis of pulmonary embolism would like to hold on any type of manipulation of the neck or thoracic spine for 4 weeks.  Encouraged her to follow-up with hematology first and then we will see her again in 4 weeks

## 2020-07-01 ENCOUNTER — Encounter: Payer: Self-pay | Admitting: Family Medicine

## 2020-07-01 ENCOUNTER — Other Ambulatory Visit: Payer: Self-pay

## 2020-07-01 ENCOUNTER — Ambulatory Visit: Payer: 59 | Admitting: Family Medicine

## 2020-07-01 NOTE — Assessment & Plan Note (Signed)
See patient for chronic neck and back pain.  Patient though has a fairly new bilateral pulmonary embolism diagnosis.  been on Eliquis but only for short time.  At this point I would like to hold on any type of manipulation especially with patient having to take large deep breaths from time to time.  At this point we will make this a no charge and see patient back again in 4 weeks but encouraged her to ask the hematologist as well.

## 2020-07-01 NOTE — Patient Instructions (Signed)
Did not do manipulation today Co pay for next visit Ask hematologist about manipulation See me in 4 weeks

## 2020-07-04 ENCOUNTER — Ambulatory Visit: Payer: 59 | Admitting: Family Medicine

## 2020-07-04 ENCOUNTER — Encounter: Payer: Self-pay | Admitting: Oncology

## 2020-07-04 ENCOUNTER — Inpatient Hospital Stay: Payer: 59 | Attending: Oncology | Admitting: Oncology

## 2020-07-04 ENCOUNTER — Inpatient Hospital Stay: Payer: 59

## 2020-07-04 ENCOUNTER — Other Ambulatory Visit: Payer: Self-pay | Admitting: Oncology

## 2020-07-04 VITALS — BP 138/81 | HR 118 | Temp 98.9°F | Resp 18 | Ht 69.0 in | Wt 242.1 lb

## 2020-07-04 DIAGNOSIS — Z803 Family history of malignant neoplasm of breast: Secondary | ICD-10-CM | POA: Diagnosis not present

## 2020-07-04 DIAGNOSIS — Z7901 Long term (current) use of anticoagulants: Secondary | ICD-10-CM | POA: Insufficient documentation

## 2020-07-04 DIAGNOSIS — I2699 Other pulmonary embolism without acute cor pulmonale: Secondary | ICD-10-CM | POA: Diagnosis not present

## 2020-07-04 DIAGNOSIS — F17210 Nicotine dependence, cigarettes, uncomplicated: Secondary | ICD-10-CM | POA: Diagnosis not present

## 2020-07-04 DIAGNOSIS — Z809 Family history of malignant neoplasm, unspecified: Secondary | ICD-10-CM | POA: Diagnosis not present

## 2020-07-04 DIAGNOSIS — U099 Post covid-19 condition, unspecified: Secondary | ICD-10-CM | POA: Insufficient documentation

## 2020-07-04 MED ORDER — APIXABAN 5 MG PO TABS: 5.0000 mg | ORAL_TABLET | Freq: Two times a day (BID) | ORAL | 2 refills | Status: DC

## 2020-07-04 NOTE — Progress Notes (Signed)
Hematology/Oncology Consult note [facility] [phone] [phone]   Patient Care Team: Pleas Koch, NP as PCP - General (Internal Medicine) Minna Merritts, MD as Consulting Physician (Cardiology)  REFERRING PROVIDER: Pleas Koch, NP  CHIEF COMPLAINTS/REASON FOR VISIT:  Evaluation of acute pulmonary embolism  HISTORY OF PRESENTING ILLNESS:   Autumn [name] is a  35 y.o.  female with PMH listed below was seen in consultation at the request of  Pleas Koch, NP  for evaluation of acute pulmonary embolism  Patient was hospitalized from 06/22/2020-06/25/2020 due to acute bilateral pulmonary embolism. Her symptoms includes left chest/flank area sharp pain, pleuritic, aggravated with deep breath and expiration.  She was tested positive using home kit for COVID-19 [results not limited available] at the end of December 2021 and she had upper respiratory symptoms at that time.  Her husband and kids were both tested positive as well.  Patient was tested in emergency room and COVID-19 was negative.  She was also taking OCPs.  Patient reports being on OCP for quite a long time.  OCP was discontinued.  06/22/2020 CT chest abdomen pelvis angiogram was positive for bilateral subsegmental, segmental pulmonary emboli.  CT evidence of right heart strain.    Bibasilar atelectasis with developing pulmonary infarct in the left lower lobe.  Stable 5 mm right middle lobe pulmonary nodules.  Hepatic steatosis and aortic atherosclerosis.  Patient was started on anticoagulation with IV heparin and then transitioned to Eliquis prior to discharge. Patient underwent 2D echocardiogram which showed no right heart strain, normal LVEF 55-60%, no shunts noted.  Ultrasound of bilateral lower extremity was negative for DVTs.  During her admission, hypercoagulable state work-up was obtained She has had negative lupus anticoagulant, cardiolipin antibodies,  negative prothrombin gene, negative factor V Leiden mutation, normal homocystine level, normal beta-2 glycoprotein level, normal protein S total.  Slightly low protein S activity.  Normal protein C antigen and activity.  Normal Antithrombin III Patient denies any family history of thrombosis. Patient was referred to hematology for further evaluation and work-up. Patient currently finished Eliquis 10 mg twice daily for 1 week, has transition to Eliquis 5 mg twice daily.  She reports tolerating well without any acute bleeding events. She has had intermittent heart racing symptoms even before being diagnosed with pulmonary embolism.  She has previously seen By Dr. Rockey Situ cardiology for heart racing a few years back, and then her symptoms are better.  She has not followed up with cardiology recently.  Chest/left flank pain has significant improved.  No shortness of breath.  She has had increased frequency of experiencing heart racing episodes.  Today in the clinic, heart rate was 118. Denies any triggers or associated symptoms.  Review of Systems  Constitutional: Negative for appetite change, chills, fatigue and fever.  HENT:   Negative for hearing loss and voice change.   Eyes: Negative for eye problems.  Respiratory: Negative for chest tightness and cough.   Cardiovascular: Positive for palpitations. Negative for chest pain.  Gastrointestinal: Negative for abdominal distention, abdominal pain and blood in stool.  Endocrine: Negative for hot flashes.  Genitourinary: Negative for difficulty urinating and frequency.   Musculoskeletal: Negative for arthralgias.  Skin: Negative for itching and rash.  Neurological: Negative for extremity weakness.  Hematological: Negative for adenopathy.  Psychiatric/Behavioral: Negative for confusion.    MEDICAL HISTORY:  Past Medical History:  Diagnosis Date  . Anxiety   . Back pain    chronic  .  Depression   . Disc disease, degenerative, lumbar or  lumbosacral   . History of ovarian cyst   . Migraine   . Scoliosis   . Shingles 06/20/2018    SURGICAL HISTORY: Past Surgical History:  Procedure Laterality Date  . APPENDECTOMY    . BREAST SURGERY Right 2008   lump/ benign    SOCIAL HISTORY: Social History   Socioeconomic History  . Marital status: Married    Spouse name: Not on file  . Number of children: 1  . Years of education: Not on file  . Highest education level: Not on file  Occupational History  . Occupation: Transport planner  Tobacco Use  . Smoking status: Former Smoker    Years: 10.00    Types: Cigarettes    Quit date: 08/18/2016    Years since quitting: 3.8  . Smokeless tobacco: Never Used  . Tobacco comment: 3 cigarettes a day  Vaping Use  . Vaping Use: Never used  Substance and Sexual Activity  . Alcohol use: Yes    Alcohol/week: 0.0 standard drinks    Comment: occasional  . Drug use: No  . Sexual activity: Yes    Birth control/protection: Pill  Other Topics Concern  . Not on file  Social History Narrative  . Not on file   Social Determinants of Health   Financial Resource Strain: Not on file  Food Insecurity: Not on file  Transportation Needs: Not on file  Physical Activity: Not on file  Stress: Not on file  Social Connections: Not on file  Intimate Partner Violence: Not on file    FAMILY HISTORY: Family History  Problem Relation Age of Onset  . Cancer Mother 54       breast  . Breast cancer Mother 29       and 14  . Alcohol abuse Father   . Vision loss Maternal Grandmother   . Heart disease Maternal Grandmother   . Heart failure Maternal Grandmother   . Cancer Maternal Grandfather   . Alcohol abuse Paternal Grandfather   . Arthritis Maternal Aunt   . Anxiety disorder Maternal Aunt   . Depression Maternal Aunt     ALLERGIES:  is allergic to imitrex [sumatriptan].  MEDICATIONS:  Current Outpatient Medications  Medication Sig Dispense Refill  . apixaban (ELIQUIS) 5 MG TABS  tablet Take 1 tablet (5 mg total) by mouth 2 (two) times daily. Only start taking this script after completing eliquis 65m BID x 7 days 60 tablet 0  . aspirin-acetaminophen-caffeine (EXCEDRIN MIGRAINE) 250-250-65 MG tablet Take by mouth every 6 (six) hours as needed for headache.    . escitalopram (LEXAPRO) 10 MG tablet Take 1.5 tablets (15 mg total) by mouth daily. 45 tablet 1  . propranolol (INDERAL) 10 MG tablet Take 1 tablet (10 mg total) by mouth 2 (two) times daily as needed. For anxiety 60 tablet 1  . valACYclovir (VALTREX) 1000 MG tablet     . apixaban (ELIQUIS) 5 MG TABS tablet Take 2 tablets (10 mg total) by mouth 2 (two) times daily for 6 days. 24 tablet 0   No current facility-administered medications for this visit.     PHYSICAL EXAMINATION: ECOG PERFORMANCE STATUS: 1 - Symptomatic but completely ambulatory Vitals:   07/04/20 1514  BP: 138/81  Pulse: (!) 118  Resp: 18  Temp: 98.9 F (37.2 C)   Filed Weights   07/04/20 1514  Weight: 242 lb 1.6 oz (109.8 kg)    Physical Exam Constitutional:  General: She is not in acute distress. HENT:     Head: Normocephalic and atraumatic.  Eyes:     General: No scleral icterus. Cardiovascular:     Rate and Rhythm: Regular rhythm. Tachycardia present.     Heart sounds: Normal heart sounds.  Pulmonary:     Effort: Pulmonary effort is normal. No respiratory distress.     Breath sounds: No wheezing.  Abdominal:     General: Bowel sounds are normal. There is no distension.     Palpations: Abdomen is soft.  Musculoskeletal:        General: No deformity. Normal range of motion.     Cervical back: Normal range of motion and neck supple.  Skin:    General: Skin is warm and dry.     Findings: No erythema or rash.  Neurological:     Mental Status: She is alert and oriented to person, place, and time. Mental status is at baseline.     Cranial Nerves: No cranial nerve deficit.     Coordination: Coordination normal.   Psychiatric:        Mood and Affect: Mood normal.     LABORATORY DATA:  I have reviewed the data as listed Lab Results  Component Value Date   WBC 8.6 06/25/2020   HGB 12.8 06/25/2020   HCT 36.7 06/25/2020   MCV 87.2 06/25/2020   PLT 295 06/25/2020   Recent Labs    10/23/19 1250 10/23/19 1250 06/21/20 0657 06/22/20 0615 06/23/20 0500 06/24/20 0655 06/25/20 0353 06/28/20 1313  NA 136  --  138 140 136 137 138 137  K 4.2  --  4.3 3.9 3.8 4.1 4.3 4.4  CL 101  --  105 105 100 102 103 102  CO2 28  --  20* 23 25 25  21* 24  GLUCOSE 85  --  101* 111* 111* 107* 103* 85  BUN 14  --  13 13 11 9 10 10   CREATININE 0.74  --  0.75 0.75 0.63 0.69 0.66 0.77  CALCIUM 9.1  --  9.2 9.2 9.3 8.7* 8.9 9.8  GFRNONAA  --    < > >60 >60 >60 >60 >60  --   PROT 7.3  --  7.6 7.3  --   --   --   --   ALBUMIN 4.2  --  3.9 3.7  --   --   --   --   AST 23  --  21 19  --   --   --   --   ALT 31  --  23 25  --   --   --   --   ALKPHOS 62  --  48 52  --   --   --   --   BILITOT 0.3  --  0.7 0.7  --   --   --   --    < > = values in this interval not displayed.   Iron/TIBC/Ferritin/ %Sat No results found for: IRON, TIBC, FERRITIN, IRONPCTSAT    RADIOGRAPHIC STUDIES: I have personally reviewed the radiological images as listed and agreed with the findings in the report. DG Chest 2 View  Result Date: 06/21/2020 CLINICAL DATA:  Chest pain. EXAM: CHEST - 2 VIEW COMPARISON:  03/03/2019. FINDINGS: Mediastinum and hilar structures normal. Heart size normal. Low lung volumes. A minimal infiltrate projected over the lower lobes on lateral view cannot be completely excluded. No pleural effusion. No pneumothorax. Mild thoracic spine scoliosis. No  acute bony abnormality. IMPRESSION: It minimal infiltrate projected over the lower lobes on lateral view cannot be completely excluded. Electronically Signed   By: Marcello Moores  Register   On: 06/21/2020 07:28   CT Angio Chest PE W and/or Wo Contrast  Result Date:  06/22/2020 CLINICAL DATA:  Pain radiating to LEFT side, pain with inspiration and movement. EXAM: CT ANGIOGRAPHY CHEST WITH CONTRAST TECHNIQUE: Multidetector CT imaging of the chest was performed using the standard protocol during bolus administration of intravenous contrast. Multiplanar CT image reconstructions and MIPs were obtained to evaluate the vascular anatomy. CONTRAST:  163m OMNIPAQUE IOHEXOL 350 MG/ML SOLN COMPARISON:  August 16, 2018 FINDINGS: Cardiovascular: Normal caliber thoracic aorta. The heart size top normal without pericardial effusion. RV to LV ratio of 0.88. Central pulmonary vasculature is normal caliber. Segmental and subsegmental filling defects RIGHT posteromedial lower lobe. Filling posterior segmental distally and extending into subsegmental branches. Images 136 through 148 of series 5. Segmental filling defect in the lateral segment of the LEFT lower lobe as well. Segmental filling defect in the LEFT upper lobe posterior segmental branch, apicoposterior segmental branch. This is occlusive and seen on images 135 through 146 no central filling defect. Filling defect in subsegmental branches in the RIGHT middle lobe involving both medial and lateral RIGHT middle lobe on images 119 and 128 and image 124 of series 5 Mediastinum/Nodes: No thoracic inlet adenopathy. No axillary lymphadenopathy. No mediastinal lymphadenopathy. Esophagus grossly normal. No hilar lymphadenopathy. Lungs/Pleura: 5 mm RIGHT middle lobe pulmonary nodule is unchanged. Basilar volume loss and ground-glass attenuation worse in the LEFT lower lobe. Airways are patent. Upper Abdomen: Hepatic steatosis. No pericholecystic stranding. Gallbladder liver incompletely imaged. Spine select spine imaged portions of pancreas, spleen and adrenal glands are normal. No acute gastrointestinal process on very limited assessment of the upper abdomen. Musculoskeletal: No acute or destructive bone finding. Review of the MIP images confirms  the above findings. IMPRESSION: 1. Bilateral submental and subsegmental pulmonary emboli. 2. RV to LV ratio at the upper range of normal just below what is considered for RIGHT heart strain. Configuration of the heart and RV to LV ratio is actually unchanged since March of 2020. Attention for any symptoms that would suggest cardiac dysfunction with echocardiography as warranted. 3. Basilar atelectasis with developing pulmonary infarct in the LEFT lower lobe. 4. Stable 5 mm RIGHT middle lobe pulmonary nodule. Stability over greater than 1 years time compatible with benign nodule. 5. Hepatic steatosis. 6. Aortic atherosclerosis. Critical Value/emergent results were called by telephone at the time of interpretation on 06/22/2020 at 10:24 am [facility] who verbally acknowledged these results. Aortic Atherosclerosis (ICD10-I70.0). Electronically Signed   By: GZetta BillsM.D.   On: 06/22/2020 10:24   CT ABDOMEN PELVIS W CONTRAST  Result Date: 06/22/2020 CLINICAL DATA:  35year old female with abdominal pain. EXAM: CT ABDOMEN AND PELVIS WITH CONTRAST TECHNIQUE: Multidetector CT imaging of the abdomen and pelvis was performed using the standard protocol following bolus administration of intravenous contrast. CONTRAST:  1079mOMNIPAQUE IOHEXOL 350 MG/ML SOLN in conjunction with contrast enhanced imaging of the chest reported separately. COMPARISON:  CTA chest today reported separately. FINDINGS: Lower chest: See chest CTA reported separately today. Hepatobiliary: Evidence of hepatic steatosis, otherwise negative. Pancreas: Negative. Spleen: Negative. Adrenals/Urinary Tract: Normal adrenal glands. Renal enhancement is symmetric, kidneys are within normal limits. No nephrolithiasis. Decompressed ureters. Diminutive and negative urinary bladder. Stomach/Bowel: Large bowel appears negative, with diminutive or absent appendix. Normal terminal ileum. No dilated small bowel.  Decompressed stomach and duodenum.  No free air, free fluid, mesenteric stranding. Vascular/Lymphatic: Major arterial structures in the abdomen and pelvis appear patent and normal. Portal venous system is patent. No lymphadenopathy. Reproductive: Negative. Other: No pelvic free fluid. Musculoskeletal: Negative. IMPRESSION: 1. No acute or inflammatory process identified in the abdomen or pelvis. 2. See also Chest CTA reported separately. 3. Hepatic steatosis. Electronically Signed   By: Genevie Ann M.D.   On: 06/22/2020 10:02   US Venous Img Lower Bilateral (DVT)  Result Date: 06/22/2020 CLINICAL DATA:  BILATERAL pulmonary emboli EXAM: BILATERAL LOWER EXTREMITY VENOUS DOPPLER ULTRASOUND TECHNIQUE: Gray-scale sonography with compression, as well as color and duplex ultrasound, were performed to evaluate the deep venous system(s) from the level of the common femoral vein through the popliteal and proximal calf veins. COMPARISON:  None FINDINGS: VENOUS Normal compressibility of the BILATERAL common femoral, superficial femoral, and popliteal veins, as well as the visualized calf veins. Visualized portions of BILATERAL profunda femoral veins and great saphenous veins are unremarkable. No filling defects to suggest DVT on grayscale or color Doppler imaging. Doppler waveforms show normal direction of venous flow, normal respiratory phasicity and response to augmentation. OTHER None. Limitations: none IMPRESSION: No evidence of deep venous thrombosis in either lower extremity. Electronically Signed   By: Lavonia Dana M.D.   On: 06/22/2020 11:43   ECHOCARDIOGRAM COMPLETE  Result Date: 06/23/2020    ECHOCARDIOGRAM REPORT   Patient Name:   Autumn [name] Date of Exam: 06/23/2020 Medical Rec #:  [id number]            Height:       70.0 in Accession #:    [id number]           Weight:       245.0 lb Date of Birth:  1986/03/07            BSA:          2.275 m Patient Age:    34 years             BP:           112/80 mmHg Patient Gender: F                     HR:           78 bpm. Exam Location:  [facility] Procedure: 2D Echo, Color Doppler and Cardiac Doppler Indications:     I26.00 Pulmonary Embolus  History:         Patient has no prior history of Echocardiogram examinations. Pt                  tested positive for COVID-19 on 06/08/20.  Sonographer:     Charmayne Sheer RDCS (AE) Referring Phys:  Baker Janus Soledad Gerlach NIU Diagnosing Phys: Ida Rogue MD  Sonographer Comments: Suboptimal apical window and no subcostal window. Image acquisition challenging due to patient body habitus. IMPRESSIONS  1. Left ventricular ejection fraction, by estimation, is 55 to 60%. The left ventricle has normal function. The left ventricle has no regional wall motion abnormalities. Left ventricular diastolic parameters were normal.  2. Right ventricular systolic function is normal. The right ventricular size is normal.  3. The inferior vena cava is normal in size with greater than 50% respiratory variability, suggesting right atrial pressure of 3 mmHg. FINDINGS  Left Ventricle: Left ventricular ejection fraction, by estimation, is 55 to 60%. The left ventricle has normal function. The left ventricle has no  regional wall motion abnormalities. The left ventricular internal cavity size was normal in size. There is  no left ventricular hypertrophy. Left ventricular diastolic parameters were normal. Right Ventricle: The right ventricular size is normal. No increase in right ventricular wall thickness. Right ventricular systolic function is normal. Left Atrium: Left atrial size was normal in size. Right Atrium: Right atrial size was normal in size. Pericardium: There is no evidence of pericardial effusion. Mitral Valve: The mitral valve is normal in structure. No evidence of mitral valve regurgitation. No evidence of mitral valve stenosis. MV peak gradient, 2.5 mmHg. The mean mitral valve gradient is 1.0 mmHg. Tricuspid Valve: The tricuspid valve is normal in structure. Tricuspid valve regurgitation is mild . No  evidence of tricuspid stenosis. Aortic Valve: The aortic valve was not well visualized. Aortic valve regurgitation is not visualized. No aortic stenosis is present. Aortic valve mean gradient measures 3.0 mmHg. Aortic valve peak gradient measures 5.3 mmHg. Aortic valve area, by VTI measures 1.96 cm. Pulmonic Valve: The pulmonic valve was normal in structure. Pulmonic valve regurgitation is not visualized. No evidence of pulmonic stenosis. Aorta: The aortic root is normal in size and structure. Venous: The inferior vena cava is normal in size with greater than 50% respiratory variability, suggesting right atrial pressure of 3 mmHg. IAS/Shunts: No atrial level shunt detected by color flow Doppler.  LEFT VENTRICLE PLAX 2D LVIDd:         4.00 cm     Diastology LVIDs:         3.20 cm     LV e' medial:    8.38 cm/s LV PW:         0.90 cm     LV E/e' medial:  8.2 LV IVS:        0.60 cm     LV e' lateral:   12.00 cm/s LVOT diam:     1.90 cm     LV E/e' lateral: 5.8 LV SV:         42 LV SV Index:   18 LVOT Area:     2.84 cm  LV Volumes (MOD) LV vol d, MOD A2C: 58.4 ml LV vol d, MOD A4C: 90.0 ml LV vol s, MOD A2C: 18.2 ml LV vol s, MOD A4C: 28.6 ml LV SV MOD A2C:     40.2 ml LV SV MOD A4C:     90.0 ml LV SV MOD BP:      50.1 ml LEFT ATRIUM             Index LA diam:        3.20 cm 1.41 cm/m LA Vol (A2C):   26.8 ml 11.78 ml/m LA Vol (A4C):   20.1 ml 8.83 ml/m LA Biplane Vol: 24.0 ml 10.55 ml/m  AORTIC VALVE                   PULMONIC VALVE AV Area (Vmax):    1.99 cm    PV Vmax:       0.91 m/s AV Area (Vmean):   2.06 cm    PV Vmean:      63.400 cm/s AV Area (VTI):     1.96 cm    PV VTI:        0.177 m AV Vmax:           115.00 cm/s PV Peak grad:  3.3 mmHg AV Vmean:          77.800 cm/s PV Mean grad:  2.0 mmHg AV VTI:            0.214 m AV Peak Grad:      5.3 mmHg AV Mean Grad:      3.0 mmHg LVOT Vmax:         80.90 cm/s LVOT Vmean:        56.600 cm/s LVOT VTI:          0.148 m LVOT/AV VTI ratio: 0.69  AORTA Ao Root  diam: 2.90 cm MITRAL VALVE MV Area (PHT): 5.70 cm    SHUNTS MV Area VTI:   2.69 cm    Systemic VTI:  0.15 m MV Peak grad:  2.5 mmHg    Systemic Diam: 1.90 cm MV Mean grad:  1.0 mmHg MV Vmax:       0.79 m/s MV Vmean:      50.5 cm/s MV Decel Time: 133 msec MV E velocity: 69.00 cm/s MV A velocity: 41.10 cm/s MV E/A ratio:  1.68 Ida Rogue MD Electronically signed by Ida Rogue MD Signature Date/Time: 06/23/2020/11:43:41 AM    Final       ASSESSMENT & PLAN:  1. Bilateral pulmonary embolism (HCC)    #Based on the clinical history, bilateral pulmonary embolism is likely provoked secondary to OCP use +/- recent Covid 19 infection. Images independently reviewed by me and discussed with patient.  Given her  bilateral pulmonary embolism clot burden, I recommend her to finish at least 3 months, if tolerable, 4 to 6 months of anticoagulation. She has had hypercoagulable work-up done during her admission and results showed slightly decreased protein S activity level.  Discussed with the patient that the level may be falsely decreased during the acute setting.  Plan to repeat in the future Patient is aware that she is at risk of bleeding.  Avoid contact sporting. For 4 to 6 weeks after diagnosis of PE, recommend to avoid strenuous exercising. Recommend patient to talk to gynecology for contraceptive measures.  She is made aware that Eliquis is not recommended during pregnancy and she needs to notify me as soon as she becomes pregnant. Recommend not to combine with NSAIDs Orders Placed This Encounter  Procedures  . CBC with Differential/Platelet    Standing Status:   Future    Standing Expiration Date:   07/04/2021  . Comprehensive metabolic panel    Standing Status:   Future    Standing Expiration Date:   07/04/2021    All questions were answered. The patient knows to call the clinic with any problems questions or concerns.  cc Pleas Koch, NP    Return of visit: 3 months Thank you for  this kind referral and the opportunity to participate in the care of this patient. A copy of today's note is routed to referring provider    Earlie Server, MD, PhD Hematology Oncology [facility] Pager- [phone] 07/04/2020

## 2020-07-04 NOTE — Progress Notes (Signed)
Pt here to establish care for bilateral pulmonary embolism. Pt reports slight chest pain today. Pt with elevated HR 118, reports that she has been having racing heartrate these past few days.

## 2020-07-07 ENCOUNTER — Ambulatory Visit: Payer: 59 | Admitting: Primary Care

## 2020-07-07 NOTE — Progress Notes (Signed)
New Outpatient Visit Date: 07/08/2020  Primary Care Provider: Pleas Koch, NP [address] [place],  [place] [address]  Chief Complaint: Elevated heart rates  HPI:  Autumn [name] is a 35 y.o. female who is being seen today for the evaluation of tachycardia in the setting of recent pulmonary embolism and COVID-19 infection. She has a history of prediabetes, ovarian cyst, recent bilateral pulmonary emboli, shingles, scoliosis with back pain, migraine headaches, obesity, and anxiety/depression. She was evaluated for the same complaint by Dr. Rockey Situ in our office in 10/2016.  Holter monitor was offered, which Autumn [name] declined.  She was given a prescription for as-needed propranolol.  She has not follow-up since.  Autumn [name] presented to the [facility] ED on 06/21/20 with chest pain and was diagnosed with PUD/gastritis, for which she was prescribed pantoprazole.  She reported having tested positive for COVID-19 ~1.5 weeks earlier.  Due to worsening chest pain, she returned to the ED on 06/22/20 and was found to have bilateral pulmonary emboli.  She was placed on heparin and transitioned to apixaban.  She was seen by Dr. Tasia Catchings (heme/onc) earlier this week, who felt that PE was most likely provoked by OCP use +/- recent COVID-19 infection.  4-6 months of anticoagulation were recommended.  Today, Autumn [name] reports that she has noticed elevated heart rate since early December, predating her COVID-19 infection and subsequent pulmonary embolism.  Initially she would notice that her heart rate would be elevated at random times, though for the last 1 to 2 weeks, this seems to be happening on a daily basis.  Yesterday, she notes that her heart rate was 148 bpm while she was seated, talking on the phone.  She is aware of her heart racing that she does not experience skipped beats.  Sometimes she also feels palpitations in her throat.  She denies associated chest pain or dyspnea.  She has had some migratory shooting  pain in her chest as well as discomfort associated with her PE that is gradually improving.  She denies syncope but is occasionally dizzy for a few seconds at a time, feeling "wobbly."  Autumn [name] was prescribed propranolol by her psychiatrist but does not like to use this frequently.  She drinks 1 cup of coffee per day and occasional other caffeinated beverages.  She also uses Excedrin on rare occasions for headaches.  --------------------------------------------------------------------------------------------------  Cardiovascular History & Procedures: Cardiovascular Problems:  Tachycardia  Pulmonary embolism  Risk Factors:  Obesity  Cath/PCI:  None  CV Surgery:  None  EP Procedures and Devices:  None  Non-Invasive Evaluation(s):  TTE (06/23/2020): Normal LV size and wall thickness.  LVEF 55-60% with normal diastolic function.  Normal RV size and function.  Normal atrial size.  No significant valvular abnormality.  BLE venous duplex (06/22/2020): No LE DVT.  Recent CV Pertinent Labs: Lab Results  Component Value Date   CHOL 186 06/28/2020   HDL 51.00 06/28/2020   LDLCALC 115 (H) 06/28/2020   TRIG 99.0 06/28/2020   CHOLHDL 4 06/28/2020   INR 0.9 06/22/2020   BNP 20.4 06/22/2020   K 4.4 06/28/2020   K 3.5 11/04/2012   BUN 10 06/28/2020   BUN 15 11/04/2012   CREATININE 0.77 06/28/2020   CREATININE 0.84 11/04/2012    --------------------------------------------------------------------------------------------------  Past Medical History:  Diagnosis Date  . Anxiety   . Back pain    chronic  . Bilateral pulmonary embolism ([place])   . Depression   . Disc disease, degenerative,  lumbar or lumbosacral   . History of ovarian cyst   . Migraine   . Prediabetes   . Scoliosis   . Shingles 06/20/2018    Past Surgical History:  Procedure Laterality Date  . APPENDECTOMY    . BREAST SURGERY Right 2008   lump/ benign    Current Meds  Medication Sig  . [START ON  08/01/2020] apixaban (ELIQUIS) 5 MG TABS tablet Take 1 tablet (5 mg total) by mouth 2 (two) times daily.  Marland Kitchen aspirin-acetaminophen-caffeine (EXCEDRIN MIGRAINE) 250-250-65 MG tablet Take by mouth every 6 (six) hours as needed for headache.  . escitalopram (LEXAPRO) 10 MG tablet Take 1.5 tablets (15 mg total) by mouth daily.  . propranolol (INDERAL) 10 MG tablet Take 1 tablet (10 mg total) by mouth 2 (two) times daily as needed. For anxiety  . valACYclovir (VALTREX) 1000 MG tablet     Allergies: Imitrex [sumatriptan]  Social History   Tobacco Use  . Smoking status: Former Smoker    Years: 10.00    Types: Cigarettes    Quit date: 08/18/2016    Years since quitting: 3.8  . Smokeless tobacco: Never Used  . Tobacco comment: 3 cigarettes a day  Vaping Use  . Vaping Use: Never used  Substance Use Topics  . Alcohol use: Yes    Alcohol/week: 0.0 standard drinks    Comment: occasional  . Drug use: No    Family History  Problem Relation Age of Onset  . Cancer Mother 39       breast  . Breast cancer Mother 34       and 69  . Alcohol abuse Father   . Vision loss Maternal Grandmother   . Heart failure Maternal Grandmother   . Cancer Maternal Grandfather   . Alcohol abuse Paternal Grandfather   . Arthritis Maternal Aunt   . Anxiety disorder Maternal Aunt   . Depression Maternal Aunt     Review of Systems: A 12-system review of systems was performed and was negative except as noted in the HPI.  --------------------------------------------------------------------------------------------------  Physical Exam: BP 110/82 (BP Location: Right Arm, Patient Position: Sitting, Cuff Size: Normal)   Pulse 85   Ht 5\' 10"  (1.778 m)   Wt 240 lb (108.9 kg)   SpO2 98%   BMI 34.44 kg/m   General: NAD. HEENT: No conjunctival pallor or scleral icterus. Facemask in place. Neck: Supple without lymphadenopathy, thyromegaly, JVD, or HJR. No carotid bruit. Lungs: Normal work of breathing. Clear to  auscultation bilaterally without wheezes or crackles. Heart: Regular rate and rhythm without murmurs, rubs, or gallops. Non-displaced PMI. Abd: Bowel sounds present. Soft, NT/ND without hepatosplenomegaly Ext: No lower extremity edema. Radial, PT, and DP pulses are 2+ bilaterally Skin: Warm and dry without rash. Neuro: CNIII-XII intact. Strength and fine-touch sensation intact in upper and lower extremities bilaterally. Psych: Normal mood and affect.  EKG: Normal sinus rhythm without significant abnormality.  Lab Results  Component Value Date   WBC 8.6 06/25/2020   HGB 12.8 06/25/2020   HCT 36.7 06/25/2020   MCV 87.2 06/25/2020   PLT 295 06/25/2020    Lab Results  Component Value Date   NA 137 06/28/2020   K 4.4 06/28/2020   CL 102 06/28/2020   CO2 24 06/28/2020   BUN 10 06/28/2020   CREATININE 0.77 06/28/2020   GLUCOSE 85 06/28/2020   ALT 25 06/22/2020    Lab Results  Component Value Date   CHOL 186 06/28/2020   HDL 51.00  06/28/2020   LDLCALC 115 (H) 06/28/2020   TRIG 99.0 06/28/2020   CHOLHDL 4 06/28/2020     --------------------------------------------------------------------------------------------------  ASSESSMENT AND PLAN: Tachycardia and palpitations: This has been a longstanding issue dating back to her visit with Dr. Rockey Situ in 2018.  Elevated heart rates had been relatively quiescent up until a few months ago, predating her COVID-19 infection and pulmonary emboli.  These 2 events, however, have seemed to increase the frequency of rapid heart rates, which are now happening daily.  Physical examination today is normal.  EKG shows normal sinus rhythm with a heart rate of 85 bpm.  No other abnormality is identified.  Echocardiogram during recent hospitalization showed normal LVEF without evidence of right heart strain.  BNP and high-sensitivity troponin I x2 were, arguing against right heart strain.  I have recommended that we check a TSH to exclude developing  hyperthyroidism.  We will also obtain a 14-day event monitor to further assess her heart rate and assess for underlying arrhythmias.  In the meantime, I encouraged Autumn [name] to stay well-hydrated and to minimize her caffeine intake.  She can use as needed propranolol for severe symptoms, though I encouraged her to minimize this while wearing the event monitor so that we can have a better sense of her heart rates without pharmacotherapy.  Pulmonary embolism: Thought to have been provoked by OCP use and recent COVID-19 infection.  She should continue anticoagulation with apixaban under the direction of Dr. Tasia Catchings and Ms. Clark.  Follow-up: Return to clinic in 6 weeks.  Nelva Bush, MD 07/08/2020 8:58 AM

## 2020-07-08 ENCOUNTER — Ambulatory Visit (INDEPENDENT_AMBULATORY_CARE_PROVIDER_SITE_OTHER): Payer: 59

## 2020-07-08 ENCOUNTER — Ambulatory Visit (INDEPENDENT_AMBULATORY_CARE_PROVIDER_SITE_OTHER): Payer: 59 | Admitting: Internal Medicine

## 2020-07-08 ENCOUNTER — Other Ambulatory Visit: Payer: Self-pay

## 2020-07-08 ENCOUNTER — Other Ambulatory Visit
Admission: RE | Admit: 2020-07-08 | Discharge: 2020-07-08 | Disposition: A | Discharge status: Home / Self Care | Payer: 59 | Source: Ambulatory Visit | Attending: Internal Medicine | Admitting: Internal Medicine

## 2020-07-08 ENCOUNTER — Encounter: Payer: Self-pay | Admitting: Internal Medicine

## 2020-07-08 VITALS — BP 110/82 | HR 85 | Ht 70.0 in | Wt 240.0 lb

## 2020-07-08 DIAGNOSIS — R000 Tachycardia, unspecified: Secondary | ICD-10-CM

## 2020-07-08 DIAGNOSIS — R002 Palpitations: Secondary | ICD-10-CM | POA: Diagnosis not present

## 2020-07-08 DIAGNOSIS — I2699 Other pulmonary embolism without acute cor pulmonale: Secondary | ICD-10-CM

## 2020-07-08 LAB — TSH: TSH: 1.029 u[IU]/mL (ref 0.350–4.500)

## 2020-07-08 NOTE — Patient Instructions (Signed)
Medication Instructions:  Your physician recommends that you continue on your current medications as directed. Please refer to the Current Medication list given to you today.  *If you need a refill on your cardiac medications before your next appointment, please call your pharmacy*   Lab Work: Your physician recommends that you return for lab work in: TODAY - TSH.  If you have labs (blood work) drawn today and your tests are completely normal, you will receive your results only by: Marland Kitchen MyChart Message (if you have MyChart) OR . A paper copy in the mail If you have any lab test that is abnormal or we need to change your treatment, we will call you to review the results.   Testing/Procedures:  ZIO MONITOR FOR 14 DAYS Your physician has recommended that you wear a Zio monitor. This monitor is a medical device that records the heart's electrical activity. Doctors most often use these monitors to diagnose arrhythmias. Arrhythmias are problems with the speed or rhythm of the heartbeat. The monitor is a small device applied to your chest. You can wear one while you do your normal daily activities. While wearing this monitor if you have any symptoms to push the button and record what you felt. Once you have worn this monitor for the period of time provider prescribed (Usually 14 days), you will return the monitor device in the postage paid box. Once it is returned they will download the data collected and provide [place] with a report which the provider will then review and we will call you with those results. Important tips:  1. Avoid showering during the first 24 hours of wearing the monitor. 2. Avoid excessive sweating to help maximize wear time. 3. Do not submerge the device, no hot tubs, and no swimming pools. 4. Keep any lotions or oils away from the patch. 5. After 24 hours you may shower with the patch on. Take brief showers with your back facing the shower head.  6. Do not remove patch once it has  been placed because that will interrupt data and decrease adhesive wear time. 7. Push the button when you have any symptoms and write down what you were feeling. 8. Once you have completed wearing your monitor, remove and place into box which has postage paid and place in your outgoing mailbox.  9. If for some reason you have misplaced your box then call our office and we can provide another box and/or mail it off for you.   Follow-Up: At [facility], you and your health needs are our priority.  As part of our continuing mission to provide you with exceptional heart care, we have created designated Provider Care Teams.  These Care Teams include your primary Cardiologist (physician) and Advanced Practice Providers (APPs -  Physician Assistants and Nurse Practitioners) who all work together to provide you with the care you need, when you need it.  We recommend signing up for the patient portal called "MyChart".  Sign up information is provided on this After Visit Summary.  MyChart is used to connect with patients for Virtual Visits (Telemedicine).  Patients are able to view lab/test results, encounter notes, upcoming appointments, etc.  Non-urgent messages can be sent to your provider as well.   To learn more about what you can do with MyChart, go to [email or web address].    Your next appointment:   6 week(s)  The format for your next appointment:   In Person  Provider:   You may see DR Harrell Gave  END or one of the following Advanced Practice Providers on your designated Care Team:    Murray Hodgkins, NP  Christell Faith, PA-C  Marrianne Mood, PA-C  Cadence Gardiner, [place]  Laurann Montana, NP

## 2020-07-11 NOTE — Addendum Note (Signed)
Addended by: Britt Bottom on: 07/11/2020 04:31 PM   Modules accepted: Orders

## 2020-07-14 ENCOUNTER — Other Ambulatory Visit: Payer: Self-pay | Admitting: Primary Care

## 2020-07-14 DIAGNOSIS — Z1231 Encounter for screening mammogram for malignant neoplasm of breast: Secondary | ICD-10-CM

## 2020-07-21 ENCOUNTER — Encounter: Payer: Self-pay | Admitting: Family Medicine

## 2020-07-26 ENCOUNTER — Ambulatory Visit (INDEPENDENT_AMBULATORY_CARE_PROVIDER_SITE_OTHER): Payer: 59 | Admitting: Psychology

## 2020-07-26 DIAGNOSIS — F411 Generalized anxiety disorder: Secondary | ICD-10-CM

## 2020-07-28 DIAGNOSIS — R000 Tachycardia, unspecified: Secondary | ICD-10-CM | POA: Diagnosis not present

## 2020-07-29 ENCOUNTER — Ambulatory Visit: Payer: 59 | Admitting: Family Medicine

## 2020-08-02 ENCOUNTER — Ambulatory Visit (INDEPENDENT_AMBULATORY_CARE_PROVIDER_SITE_OTHER): Payer: 59 | Admitting: Psychology

## 2020-08-02 DIAGNOSIS — F411 Generalized anxiety disorder: Secondary | ICD-10-CM | POA: Diagnosis not present

## 2020-08-09 ENCOUNTER — Ambulatory Visit (INDEPENDENT_AMBULATORY_CARE_PROVIDER_SITE_OTHER): Payer: 59 | Admitting: Psychology

## 2020-08-09 DIAGNOSIS — F411 Generalized anxiety disorder: Secondary | ICD-10-CM | POA: Diagnosis not present

## 2020-08-15 ENCOUNTER — Ambulatory Visit (INDEPENDENT_AMBULATORY_CARE_PROVIDER_SITE_OTHER): Payer: 59 | Admitting: Psychology

## 2020-08-15 DIAGNOSIS — F411 Generalized anxiety disorder: Secondary | ICD-10-CM | POA: Diagnosis not present

## 2020-08-15 DIAGNOSIS — B009 Herpesviral infection, unspecified: Secondary | ICD-10-CM

## 2020-08-16 ENCOUNTER — Other Ambulatory Visit: Payer: Self-pay | Admitting: Primary Care

## 2020-08-16 MED ORDER — VALACYCLOVIR HCL 1 G PO TABS: ORAL_TABLET | ORAL | 0 refills | Status: DC

## 2020-08-18 ENCOUNTER — Encounter: Payer: Self-pay | Admitting: Oncology

## 2020-08-18 NOTE — Progress Notes (Signed)
Cardiology Office Note:    Date:  08/19/2020   ID:  Autumn [name], DOB [birth year], MRN [id number]  PCP:  Autumn Koch, NP  [facility] Cardiologist:  [facility] Electrophysiologist:  None   Referring MD: Autumn Koch, NP   Chief Complaint: Follow-up on the heart monitor  History of Present Illness:    Autumn [name] is a 35 y.o. female with a hx of prediabetes, ovarian cyst, recent bilateral pulmonary emboli on Eliquis, obesity, and anxiety/depression who is being seen for heart monitor follow-up.   She was evaluated by Dr. Rockey Situ in 10/2016 for tachycardia. Holter monitor was offered but patient declined.   The patient presented to [facility] ED on 06/21/20 with chest pain and diagnosed with PUD/gastritis. She had covid 1.5 weeks prior. She returned to the ED 1/12 for worsening chest pain and was found to have bilateral pulmonary edema suspected 2/2 to OCP and recent COVID infection. Echo showed normal LV function without right heart strain. She was discharged on Eliquis.   She was started on propranolol by her psychiatrist, but did not use it frequently.   She was seen 1/28 in the cardiology office and reported elevated heart rate. EKG showed SR. Heart monitor was ordered  Today, heart monitor was discussed with the patient. She reports her heart rates have calmed down and she hasn't been checking it as much. Still has palpitations, but very rarely. Also episodes have not been as intense. She has some right sided chest pressure. She had an episode this weekend. Not worse with exertion. It lasted a couple hours. Also had a fullness sensation and was not eating good, and wonders if chest discomfort GI in origin. She has a lot of stress with family in [place], going through separation, and health issues with her mom. Wonder if stress is causing symptoms. She has shortness of breath on exertion, but reports weight gain and sedentary lifestyle. Denies fever,  chills, swelling, orthopnea. She has not taken propranolol.    Past Medical History:  Diagnosis Date  . Anxiety   . Back pain    chronic  . Bilateral pulmonary embolism ([place])    a. 06/2020 following COVID infection.  Marland Kitchen COVID-19 virus infection 06/2020  . Depression   . Disc disease, degenerative, lumbar or lumbosacral   . History of echocardiogram    a. 06/2020 Echo: EF 55-60%, no rwma, nl RV size/fxn.  Marland Kitchen History of ovarian cyst   . Migraine   . Palpitations    a. 2/022 Zio: RSR, 85 (47-112). Rare PACs/PVCs. No significant arrhythmias/prolonged pauses.  . Prediabetes   . Scoliosis   . Shingles 06/20/2018    Past Surgical History:  Procedure Laterality Date  . APPENDECTOMY    . BREAST SURGERY Right 2008   lump/ benign    Current Medications: Current Meds  Medication Sig  . apixaban (ELIQUIS) 5 MG TABS tablet Take 1 tablet (5 mg total) by mouth 2 (two) times daily.  Marland Kitchen aspirin-acetaminophen-caffeine (EXCEDRIN MIGRAINE) 250-250-65 MG tablet Take by mouth every 6 (six) hours as needed for headache.  . escitalopram (LEXAPRO) 20 MG tablet Take 20 mg by mouth daily.  . propranolol (INDERAL) 10 MG tablet Take 1 tablet (10 mg total) by mouth 2 (two) times daily as needed. For anxiety  . valACYclovir (VALTREX) 1000 MG tablet Take 2 tablets by mouth twice daily for one day, as needed for oral sores.     Allergies:   Imitrex [sumatriptan]  Social History   Socioeconomic History  . Marital status: Married    Spouse name: Not on file  . Number of children: 1  . Years of education: Not on file  . Highest education level: Not on file  Occupational History  . Occupation: Transport planner  Tobacco Use  . Smoking status: Former Smoker    Years: 10.00    Types: Cigarettes    Quit date: 08/18/2016    Years since quitting: 4.0  . Smokeless tobacco: Never Used  . Tobacco comment: 3 cigarettes a day  Vaping Use  . Vaping Use: Never used  Substance and Sexual Activity  . Alcohol use:  Yes    Alcohol/week: 0.0 standard drinks    Comment: occasional  . Drug use: No  . Sexual activity: Yes    Birth control/protection: Pill  Other Topics Concern  . Not on file  Social History Narrative  . Not on file   Social Determinants of Health   Financial Resource Strain: Not on file  Food Insecurity: Not on file  Transportation Needs: Not on file  Physical Activity: Not on file  Stress: Not on file  Social Connections: Not on file     Family History: The patient's family history includes Alcohol abuse in her father and paternal grandfather; Anxiety disorder in her maternal aunt; Arthritis in her maternal aunt; Breast cancer (age of onset: 10) in her mother; Cancer in her maternal grandfather; Cancer (age of onset: 50) in her mother; Depression in her maternal aunt; Heart failure in her maternal grandmother; Vision loss in her maternal grandmother.  ROS:   Please see the history of present illness.     All other systems reviewed and are negative.  EKGs/Labs/Other Studies Reviewed:    The following studies were reviewed today: Heart monitor 07/2020 Study Highlights    The patient was monitored for 14 days.  The predominant rhythm was sinus with an average rate of 85 bpm (range 47-172 bpm).  There were rare PACs and PVCs.  No significant arrhythmia or prolonged pause was identified.  Patient triggered events correspond to normal sinus rhythm and sinus tachycardia.   Predominately sinus rhythm with rare PACs and PVCs.  No significant arrhythmia.   Echo 06/2020 1. Left ventricular ejection fraction, by estimation, is 55 to 60%. The  left ventricle has normal function. The left ventricle has no regional  wall motion abnormalities. Left ventricular diastolic parameters were  normal.  2. Right ventricular systolic function is normal. The right ventricular  size is normal.  3. The inferior vena cava is normal in size with greater than 50%  respiratory  variability, suggesting right atrial pressure of 3 mmHg.    EKG:  EKG is not ordered today.   Recent Labs: 06/22/2020: ALT 25; B Natriuretic Peptide 20.4 06/25/2020: Hemoglobin 12.8; Platelets 295 06/28/2020: BUN 10; Creatinine, Ser 0.77; Potassium 4.4; Sodium 137 07/08/2020: TSH 1.029  Recent Lipid Panel    Component Value Date/Time   CHOL 186 06/28/2020 1313   TRIG 99.0 06/28/2020 1313   HDL 51.00 06/28/2020 1313   CHOLHDL 4 06/28/2020 1313   VLDL 19.8 06/28/2020 1313   LDLCALC 115 (H) 06/28/2020 1313      Physical Exam:    VS:  BP 110/80 (BP Location: Left Arm, Patient Position: Sitting, Cuff Size: Large)   Pulse 90   Ht 5\' 10"  (1.778 m)   Wt 242 lb (109.8 kg)   SpO2 98%   BMI 34.72 kg/m  Wt Readings from Last 3 Encounters:  08/19/20 242 lb (109.8 kg)  07/08/20 240 lb (108.9 kg)  07/04/20 242 lb 1.6 oz (109.8 kg)     GEN:  Well nourished, well developed in no acute distress HEENT: Normal NECK: No JVD; No carotid bruits LYMPHATICS: No lymphadenopathy CARDIAC: RRR, no murmurs, rubs, gallops RESPIRATORY:  Clear to auscultation without rales, wheezing or rhonchi  ABDOMEN: Soft, non-tender, non-distended MUSCULOSKELETAL:  No edema; No deformity  SKIN: Warm and dry NEUROLOGIC:  Alert and oriented x 3 PSYCHIATRIC:  Normal affect   ASSESSMENT:    1. Palpitations   2. Bilateral pulmonary embolism (HCC)   3. Chest pain of uncertain etiology    PLAN:    In order of problems listed above:  Tachycardia/palpitations Heart monitor showed SR with rare PACs and PVCs, triggered events were sinus tachycardia. Prior echo showed normal LVEF 55-60%. Patient reports overall she is feeling better. Frequency of palpitations have greatly decreased and she is rarely checking her vitals. She has been going through a lot of stress, which I suspect was contributing to symptoms. As life events have improved it seems symptoms have improved as well. She has not needed propranolol. Labs  drawn last visit were unremarkable. No further work-up at this time.   Atypical chest pain Reported an episode of right sided chest pressure over the weekend. This was nonexertional and constant. Patient reports she a sense of fullness and diet is poor, suspects  possibleGI etiology for chest discomfort. Also stress might be a contributing factor, along with recent covid infection and bilateral PE. She had a normal echo 06/2020 and unremarkable heart monitor, so overall reassuring.  Stress test was discussed but patient would like to wait at this time. We can see her back in 3 months to re-evaluate symptoms. Lifestyle changes were discussed in detail.   History of bilateral PE Compliant with Eliquis.   Disposition: Follow up in 3 month(s) with APP/MD   Signed, Adric Wrede Ninfa Meeker, PA-C  08/19/2020 12:01 PM    [facility]

## 2020-08-19 ENCOUNTER — Ambulatory Visit: Payer: 59 | Admitting: Medical

## 2020-08-19 ENCOUNTER — Encounter: Payer: Self-pay | Admitting: Nurse Practitioner

## 2020-08-19 ENCOUNTER — Other Ambulatory Visit: Payer: Self-pay

## 2020-08-19 VITALS — BP 110/80 | HR 90 | Ht 70.0 in | Wt 242.0 lb

## 2020-08-19 DIAGNOSIS — R002 Palpitations: Secondary | ICD-10-CM

## 2020-08-19 DIAGNOSIS — I2699 Other pulmonary embolism without acute cor pulmonale: Secondary | ICD-10-CM | POA: Diagnosis not present

## 2020-08-19 DIAGNOSIS — R079 Chest pain, unspecified: Secondary | ICD-10-CM | POA: Diagnosis not present

## 2020-08-19 NOTE — Patient Instructions (Signed)
Medication Instructions:  No changes at this time.  *If you need a refill on your cardiac medications before your next appointment, please call your pharmacy*   Lab Work: None  If you have labs (blood work) drawn today and your tests are completely normal, you will receive your results only by: Marland Kitchen MyChart Message (if you have MyChart) OR . A paper copy in the mail If you have any lab test that is abnormal or we need to change your treatment, we will call you to review the results.   Testing/Procedures: None   Follow-Up: At [facility], you and your health needs are our priority.  As part of our continuing mission to provide you with exceptional heart care, we have created designated Provider Care Teams.  These Care Teams include your primary Cardiologist (physician) and Advanced Practice Providers (APPs -  Physician Assistants and Nurse Practitioners) who all work together to provide you with the care you need, when you need it.   Your next appointment:   3 month(s)  The format for your next appointment:   In Person  Provider:   Nelva Bush, MD, Murray Hodgkins, NP or Cadence Kathlen Mody, [place]

## 2020-08-25 ENCOUNTER — Ambulatory Visit (INDEPENDENT_AMBULATORY_CARE_PROVIDER_SITE_OTHER): Payer: 59 | Admitting: Psychology

## 2020-08-25 DIAGNOSIS — F411 Generalized anxiety disorder: Secondary | ICD-10-CM

## 2020-08-30 ENCOUNTER — Ambulatory Visit
Admission: RE | Admit: 2020-08-30 | Discharge: 2020-08-30 | Disposition: A | Discharge status: Home / Self Care | Payer: 59 | Source: Ambulatory Visit | Attending: Primary Care | Admitting: Primary Care

## 2020-08-30 ENCOUNTER — Other Ambulatory Visit: Payer: Self-pay

## 2020-08-30 DIAGNOSIS — Z1231 Encounter for screening mammogram for malignant neoplasm of breast: Secondary | ICD-10-CM | POA: Diagnosis not present

## 2020-09-08 ENCOUNTER — Ambulatory Visit (INDEPENDENT_AMBULATORY_CARE_PROVIDER_SITE_OTHER): Payer: 59 | Admitting: Psychology

## 2020-09-08 DIAGNOSIS — F411 Generalized anxiety disorder: Secondary | ICD-10-CM | POA: Diagnosis not present

## 2020-09-10 ENCOUNTER — Other Ambulatory Visit: Payer: Self-pay

## 2020-09-23 ENCOUNTER — Ambulatory Visit (INDEPENDENT_AMBULATORY_CARE_PROVIDER_SITE_OTHER): Payer: 59 | Admitting: Psychology

## 2020-09-23 DIAGNOSIS — F411 Generalized anxiety disorder: Secondary | ICD-10-CM

## 2020-09-30 ENCOUNTER — Other Ambulatory Visit: Payer: Self-pay

## 2020-09-30 MED FILL — Apixaban Tab 5 MG: ORAL | 30 days supply | Qty: 60 | Fill #0 | Status: AC

## 2020-10-03 ENCOUNTER — Encounter: Payer: Self-pay | Admitting: Oncology

## 2020-10-03 ENCOUNTER — Inpatient Hospital Stay: Payer: 59 | Attending: Oncology

## 2020-10-03 ENCOUNTER — Inpatient Hospital Stay: Payer: 59 | Admitting: Oncology

## 2020-10-03 VITALS — BP 123/90 | HR 78 | Temp 99.2°F | Resp 18 | Wt 247.0 lb

## 2020-10-03 DIAGNOSIS — Z8616 Personal history of COVID-19: Secondary | ICD-10-CM | POA: Insufficient documentation

## 2020-10-03 DIAGNOSIS — Z803 Family history of malignant neoplasm of breast: Secondary | ICD-10-CM | POA: Diagnosis not present

## 2020-10-03 DIAGNOSIS — I2699 Other pulmonary embolism without acute cor pulmonale: Secondary | ICD-10-CM

## 2020-10-03 DIAGNOSIS — Z87891 Personal history of nicotine dependence: Secondary | ICD-10-CM | POA: Diagnosis not present

## 2020-10-03 DIAGNOSIS — Z7901 Long term (current) use of anticoagulants: Secondary | ICD-10-CM

## 2020-10-03 DIAGNOSIS — R2689 Other abnormalities of gait and mobility: Secondary | ICD-10-CM | POA: Diagnosis not present

## 2020-10-03 DIAGNOSIS — Z809 Family history of malignant neoplasm, unspecified: Secondary | ICD-10-CM | POA: Insufficient documentation

## 2020-10-03 LAB — COMPREHENSIVE METABOLIC PANEL
ALT: 63 U/L — ABNORMAL HIGH (ref 0–44)
AST: 37 U/L (ref 15–41)
Albumin: 4.1 g/dL (ref 3.5–5.0)
Alkaline Phosphatase: 61 U/L (ref 38–126)
Anion gap: 12 (ref 5–15)
BUN: 10 mg/dL (ref 6–20)
CO2: 25 mmol/L (ref 22–32)
Calcium: 8.8 mg/dL — ABNORMAL LOW (ref 8.9–10.3)
Chloride: 101 mmol/L (ref 98–111)
Creatinine, Ser: 0.8 mg/dL (ref 0.44–1.00)
GFR, Estimated: >60 mL/min (ref >60)
Glucose, Bld: 125 mg/dL — ABNORMAL HIGH (ref 70–99)
Potassium: 4 mmol/L (ref 3.5–5.1)
Sodium: 138 mmol/L (ref 135–145)
Total Bilirubin: 0.5 mg/dL (ref 0.3–1.2)
Total Protein: 7.3 g/dL (ref 6.5–8.1)

## 2020-10-03 LAB — CBC WITH DIFFERENTIAL/PLATELET
Abs Immature Granulocytes: 0.03 10*3/uL (ref 0.00–0.07)
Basophils Absolute: 0.1 10*3/uL (ref 0.0–0.1)
Basophils Relative: 1 %
Eosinophils Absolute: 0.2 10*3/uL (ref 0.0–0.5)
Eosinophils Relative: 3 %
HCT: 41.2 % (ref 36.0–46.0)
Hemoglobin: 13.9 g/dL (ref 12.0–15.0)
Immature Granulocytes: 0 %
Lymphocytes Relative: 44 %
Lymphs Abs: 3.7 10*3/uL (ref 0.7–4.0)
MCH: 30.5 pg (ref 26.0–34.0)
MCHC: 33.7 g/dL (ref 30.0–36.0)
MCV: 90.4 fL (ref 80.0–100.0)
Monocytes Absolute: 0.6 10*3/uL (ref 0.1–1.0)
Monocytes Relative: 7 %
Neutro Abs: 3.9 10*3/uL (ref 1.7–7.7)
Neutrophils Relative %: 45 %
Platelets: 299 10*3/uL (ref 150–400)
RBC: 4.56 MIL/uL (ref 3.87–5.11)
RDW: 12.8 % (ref 11.5–15.5)
WBC: 8.5 10*3/uL (ref 4.0–10.5)
nRBC: 0 % (ref 0.0–0.2)

## 2020-10-03 NOTE — Progress Notes (Signed)
Patient here for follow up. Pt reports that on occasions she will be standing/ walking and then suddenly start leaning to her right side.

## 2020-10-03 NOTE — Progress Notes (Signed)
Hematology/Oncology Consult note [facility] Telephone:([phone] [phone]   Patient Care Team: Pleas Koch, NP as PCP - General (Internal Medicine) Minna Merritts, MD as Consulting Physician (Cardiology)  REFERRING PROVIDER: Pleas Koch, NP  CHIEF COMPLAINTS/REASON FOR VISIT:  Evaluation of acute pulmonary embolism  HISTORY OF PRESENTING ILLNESS:   Autumn [name] is a  35 y.o.  female with PMH listed below was seen in consultation at the request of  Pleas Koch, NP  for evaluation of acute pulmonary embolism  Patient was hospitalized from 06/22/2020-06/25/2020 due to acute bilateral pulmonary embolism. Her symptoms includes left chest/flank area sharp pain, pleuritic, aggravated with deep breath and expiration.  She was tested positive using home kit for COVID-19 [results not limited available] at the end of December 2021 and she had upper respiratory symptoms at that time.  Her husband and kids were both tested positive as well.  Patient was tested in emergency room and COVID-19 was negative.  She was also taking OCPs.  Patient reports being on OCP for quite a long time.  OCP was discontinued.  06/22/2020 CT chest abdomen pelvis angiogram was positive for bilateral subsegmental, segmental pulmonary emboli.  CT evidence of right heart strain.    Bibasilar atelectasis with developing pulmonary infarct in the left lower lobe.  Stable 5 mm right middle lobe pulmonary nodules.  Hepatic steatosis and aortic atherosclerosis.  Patient was started on anticoagulation with IV heparin and then transitioned to Eliquis prior to discharge. Patient underwent 2D echocardiogram which showed no right heart strain, normal LVEF 55-60%, no shunts noted.  Ultrasound of bilateral lower extremity was negative for DVTs.  During her admission, hypercoagulable state work-up was obtained She has had negative lupus anticoagulant, cardiolipin antibodies,  negative prothrombin gene, negative factor V Leiden mutation, normal homocystine level, normal beta-2 glycoprotein level, normal protein S total.  Slightly low protein S activity.  Normal protein C antigen and activity.  Normal Antithrombin III Patient denies any family history of thrombosis. Patient was referred to hematology for further evaluation and work-up. Patient currently finished Eliquis 10 mg twice daily for 1 week, has transition to Eliquis 5 mg twice daily.  She reports tolerating well without any acute bleeding events. She has had intermittent heart racing symptoms even before being diagnosed with pulmonary embolism.  She has previously seen By Dr. Rockey Situ cardiology for heart racing a few years back, and then her symptoms are better.  She has not followed up with cardiology recently.  Chest/left flank pain has significant improved.  No shortness of breath.  She has had increased frequency of experiencing heart racing episodes.  Today in the clinic, heart rate was 118. Denies any triggers or associated symptoms.   INTERVAL HISTORY Autumn [name] is a 35 y.o. female who has above history reviewed by me today presents for follow up visit for history of VTE, chronic anticoagulation. Problems and complaints are listed below: Patient reports tolerating anticoagulation with Eliquis.  No active bleeding events.  Menstrual cycle is heavy. She reports occasional however recurrent episodes of losing balance and leaning to her right side.  Denies any slurred speech, focal deficits.  Review of Systems  Constitutional: Negative for appetite change, chills, fatigue and fever.  HENT:   Negative for hearing loss and voice change.   Eyes: Negative for eye problems.  Respiratory: Negative for chest tightness and cough.   Cardiovascular: Negative for chest pain and palpitations.  Gastrointestinal: Negative for abdominal distention, abdominal  pain and blood in stool.  Endocrine: Negative for  hot flashes.  Genitourinary: Negative for difficulty urinating and frequency.   Musculoskeletal: Negative for arthralgias.  Skin: Negative for itching and rash.  Neurological: Negative for extremity weakness.       Balance problem  Hematological: Negative for adenopathy.  Psychiatric/Behavioral: Negative for confusion.    MEDICAL HISTORY:  Past Medical History:  Diagnosis Date  . Anxiety   . Back pain    chronic  . Bilateral pulmonary embolism ([place])    a. 06/2020 following COVID infection.  Marland Kitchen COVID-19 virus infection 06/2020  . Depression   . Disc disease, degenerative, lumbar or lumbosacral   . History of echocardiogram    a. 06/2020 Echo: EF 55-60%, no rwma, nl RV size/fxn.  Marland Kitchen History of ovarian cyst   . Migraine   . Palpitations    a. 2/022 Zio: RSR, 85 (47-112). Rare PACs/PVCs. No significant arrhythmias/prolonged pauses.  . Prediabetes   . Scoliosis   . Shingles 06/20/2018    SURGICAL HISTORY: Past Surgical History:  Procedure Laterality Date  . APPENDECTOMY    . BREAST SURGERY Right 2008   lump/ benign    SOCIAL HISTORY: Social History   Socioeconomic History  . Marital status: Married    Spouse name: Not on file  . Number of children: 1  . Years of education: Not on file  . Highest education level: Not on file  Occupational History  . Occupation: Transport planner  Tobacco Use  . Smoking status: Former Smoker    Years: 10.00    Types: Cigarettes    Quit date: 08/18/2016    Years since quitting: 4.1  . Smokeless tobacco: Never Used  . Tobacco comment: 3 cigarettes a day  Vaping Use  . Vaping Use: Never used  Substance and Sexual Activity  . Alcohol use: Yes    Alcohol/week: 0.0 standard drinks    Comment: occasional  . Drug use: No  . Sexual activity: Yes    Birth control/protection: Pill  Other Topics Concern  . Not on file  Social History Narrative  . Not on file   Social Determinants of Health   Financial Resource Strain: Not on file  Food  Insecurity: Not on file  Transportation Needs: Not on file  Physical Activity: Not on file  Stress: Not on file  Social Connections: Not on file  Intimate Partner Violence: Not on file    FAMILY HISTORY: Family History  Problem Relation Age of Onset  . Cancer Mother 50       breast  . Breast cancer Mother 32       and 35  . Alcohol abuse Father   . Vision loss Maternal Grandmother   . Heart failure Maternal Grandmother   . Cancer Maternal Grandfather   . Alcohol abuse Paternal Grandfather   . Arthritis Maternal Aunt   . Anxiety disorder Maternal Aunt   . Depression Maternal Aunt     ALLERGIES:  is allergic to imitrex [sumatriptan].  MEDICATIONS:  Current Outpatient Medications  Medication Sig Dispense Refill  . apixaban (ELIQUIS) 5 MG TABS tablet TAKE 1 TABLET (5 MG TOTAL) BY MOUTH 2 (TWO) TIMES DAILY. 60 tablet 2  . aspirin-acetaminophen-caffeine (EXCEDRIN MIGRAINE) 297-989-21 MG tablet Take by mouth every 6 (six) hours as needed for headache.    Marland Kitchen COVID-19 At Home Antigen Test KIT USE AS DIRECTED 4 kit 0  . escitalopram (LEXAPRO) 20 MG tablet Take 20 mg by mouth daily.    Marland Kitchen  propranolol (INDERAL) 10 MG tablet Take 1 tablet (10 mg total) by mouth 2 (two) times daily as needed. For anxiety (Patient not taking: Reported on 10/03/2020) 60 tablet 1  . valACYclovir (VALTREX) 1000 MG tablet TAKE 2 TABLETS BY MOUTH TWICE DAILY FOR ONE DAY, AS NEEDED FOR MOUTH SORES. (Patient not taking: Reported on 10/03/2020) 12 tablet 0   No current facility-administered medications for this visit.     PHYSICAL EXAMINATION: ECOG PERFORMANCE STATUS: 1 - Symptomatic but completely ambulatory Vitals:   10/03/20 1324  BP: 123/90  Pulse: 78  Resp: 18  Temp: 99.2 F (37.3 C)   Filed Weights   10/03/20 1324  Weight: 247 lb (112 kg)    Physical Exam Constitutional:      General: She is not in acute distress. HENT:     Head: Normocephalic and atraumatic.  Eyes:     General: No scleral  icterus. Cardiovascular:     Rate and Rhythm: Normal rate and regular rhythm.     Heart sounds: Normal heart sounds.  Pulmonary:     Effort: Pulmonary effort is normal. No respiratory distress.     Breath sounds: No wheezing.  Abdominal:     General: Bowel sounds are normal. There is no distension.     Palpations: Abdomen is soft.  Musculoskeletal:        General: No deformity. Normal range of motion.     Cervical back: Normal range of motion and neck supple.  Skin:    General: Skin is warm and dry.     Findings: No erythema or rash.  Neurological:     Mental Status: She is alert and oriented to person, place, and time. Mental status is at baseline.     Cranial Nerves: No cranial nerve deficit.     Coordination: Coordination normal.     Comments: Romberg sign is positive.   Psychiatric:        Mood and Affect: Mood normal.     LABORATORY DATA:  I have reviewed the data as listed Lab Results  Component Value Date   WBC 8.5 10/03/2020   HGB 13.9 10/03/2020   HCT 41.2 10/03/2020   MCV 90.4 10/03/2020   PLT 299 10/03/2020   Recent Labs    06/21/20 0657 06/22/20 0615 06/23/20 0500 06/24/20 0655 06/25/20 0353 06/28/20 1313 10/03/20 1254  NA 138 140   < > 137 138 137 138  K 4.3 3.9   < > 4.1 4.3 4.4 4.0  CL 105 105   < > 102 103 102 101  CO2 20* 23   < > 25 21* 24 25  GLUCOSE 101* 111*   < > 107* 103* 85 125*  BUN 13 13   < > 9 10 10 10   CREATININE 0.75 0.75   < > 0.69 0.66 0.77 0.80  CALCIUM 9.2 9.2   < > 8.7* 8.9 9.8 8.8*  GFRNONAA >60 >60   < > >60 >60  --  >60  PROT 7.6 7.3  --   --   --   --  7.3  ALBUMIN 3.9 3.7  --   --   --   --  4.1  AST 21 19  --   --   --   --  37  ALT 23 25  --   --   --   --  63*  ALKPHOS 48 52  --   --   --   --  61  BILITOT  0.7 0.7  --   --   --   --  0.5   < > = values in this interval not displayed.   Iron/TIBC/Ferritin/ %Sat No results found for: IRON, TIBC, FERRITIN, IRONPCTSAT    RADIOGRAPHIC STUDIES: I have personally  reviewed the radiological images as listed and agreed with the findings in the report. No results found.    ASSESSMENT & PLAN:  1. Bilateral pulmonary embolism ([place])   2. Chronic anticoagulation   3. Balance problem    #bilateral pulmonary embolism is likely provoked secondary to OCP use +/- recent Covid 19 infection. Patient tolerates anticoagulation very well.  Recommend to finish total 6 months of anticoagulation given her initial embolism burden. Hypercoagulable work-up done during her admission were normal except slightly decreased protein S activity level. Recommend patient to have level repeated when she comes off her anticoagulation Patient is aware that Eliquis is not recommended during pregnancy and she needs to notify MD as soon as she becomes pregnant.   Patient reports that she has plans to have fertility treatments in the near future. Recommend antepartum prophylactic anticoagulation followed by postpartum therapeutic anticoagulation for 4 to 6 weeks. She will further discuss with her OB/GYN.  #Balancing issue.  Etiology unknown.  Romberg sign positive. Check B12 at next visit. Recommend patient to discuss with primary care provider.  I will defer PCP to decide whether she needs neurology work-up.  cc PCP  Orders Placed This Encounter  Procedures  . Comprehensive metabolic panel    Standing Status:   Future    Standing Expiration Date:   10/03/2021  . CBC with Differential/Platelet    Standing Status:   Future    Standing Expiration Date:   10/03/2021  . Vitamin B12    Standing Status:   Future    Standing Expiration Date:   10/03/2021  . Protein C activity    Standing Status:   Future    Standing Expiration Date:   10/03/2021    All questions were answered. The patient knows to call the clinic with any problems questions or concerns.  cc Pleas Koch, NP    Return of visit: 3 months   Earlie Server, MD, PhD Hematology Oncology [facility] at  [facility] Pager- [phone] 10/03/2020

## 2020-10-07 ENCOUNTER — Ambulatory Visit: Payer: 59 | Admitting: Psychology

## 2020-10-12 ENCOUNTER — Other Ambulatory Visit: Payer: Self-pay

## 2020-10-12 ENCOUNTER — Encounter: Payer: Self-pay | Admitting: Psychiatry

## 2020-10-12 ENCOUNTER — Telehealth (INDEPENDENT_AMBULATORY_CARE_PROVIDER_SITE_OTHER): Payer: 59 | Admitting: Psychiatry

## 2020-10-12 DIAGNOSIS — F419 Anxiety disorder, unspecified: Secondary | ICD-10-CM | POA: Diagnosis not present

## 2020-10-12 DIAGNOSIS — F3342 Major depressive disorder, recurrent, in full remission: Secondary | ICD-10-CM | POA: Diagnosis not present

## 2020-10-12 MED ORDER — ESCITALOPRAM OXALATE 20 MG PO TABS
20.0000 mg | ORAL_TABLET | Freq: Every day | ORAL | 0 refills | Status: DC
  Filled 2020-10-12 – 2020-12-01 (×2): qty 90, 90d supply, fill #0

## 2020-10-12 NOTE — Progress Notes (Signed)
Virtual Visit via Video Note  I connected with Autumn [name] on 10/12/20 at  3:30 PM EDT by a video enabled telemedicine application and verified that I am speaking with the correct person using two identifiers.  Location Provider Location : [facility] Patient Location : Work  Participants: Patient , Provider   I discussed the limitations of evaluation and management by telemedicine and the availability of in person appointments. The patient expressed understanding and agreed to proceed.    I discussed the assessment and treatment plan with the patient. The patient was provided an opportunity to ask questions and all were answered. The patient agreed with the plan and demonstrated an understanding of the instructions.   The patient was advised to call back or seek an in-person evaluation if the symptoms worsen or if the condition fails to improve as anticipated.   [place] MD OP Progress Note  10/12/2020 5:37 PM [facility]  MRN:  [id number]  Chief Complaint:  Chief Complaint    Follow-up; Anxiety; Depression     HPI: Autumn [name] is a 35 year old Caucasian female, employed, lives in [place], has a history of depression, migraine headaches, history of ovarian cysts, degenerative disc disease was evaluated by telemedicine today.  Patient was last seen on 03/17/2020.  Patient today reports that a lot happened since her last visit.  She reports she got infected with COVID-19 virus.  She reports she was admitted to the hospital for bilateral pulmonary embolism.  She reports her mother is currently struggling with health problems and she is currently separated from her husband.  She reports she has family in [place] and that has been a struggle to watch the ongoing war.  She however reports she has been keeping in touch with her family members.  She hence has been through a lot.  She however reports she is handling it well.  She is currently in psychotherapy sessions with Ms.  Dayle Points.  She sees her on a weekly basis.  Patient reports sleep is overall okay.  She denies any suicidality, homicidality or perceptual disturbances.  Patient reports she is compliant on the Lexapro.  Denies side effects.  Patient denies any other concerns today.  Visit Diagnosis:    ICD-10-CM   1. MDD (major depressive disorder), recurrent, in full remission ([place])  F33.42 escitalopram (LEXAPRO) 20 MG tablet  2. Anxiety disorder, unspecified type  F41.9 escitalopram (LEXAPRO) 20 MG tablet    Past Psychiatric History: I have reviewed past psychiatric history from progress note on 02/10/2020.  Past trials of Lexapro, Wellbutrin-noncompliant  Past Medical History:  Past Medical History:  Diagnosis Date  . Anxiety   . Back pain    chronic  . Bilateral pulmonary embolism ([place])    a. 06/2020 following COVID infection.  Marland Kitchen COVID-19 virus infection 06/2020  . Depression   . Disc disease, degenerative, lumbar or lumbosacral   . History of echocardiogram    a. 06/2020 Echo: EF 55-60%, no rwma, nl RV size/fxn.  Marland Kitchen History of ovarian cyst   . Migraine   . Palpitations    a. 2/022 Zio: RSR, 85 (47-112). Rare PACs/PVCs. No significant arrhythmias/prolonged pauses.  . Prediabetes   . Scoliosis   . Shingles 06/20/2018    Past Surgical History:  Procedure Laterality Date  . APPENDECTOMY    . BREAST SURGERY Right 2008   lump/ benign    Family Psychiatric History: I have reviewed family psychiatric history from progress note on 02/10/2020  Family History:  Family History  Problem Relation Age of Onset  . Cancer Mother 27       breast  . Breast cancer Mother 59       and 101  . Alcohol abuse Father   . Vision loss Maternal Grandmother   . Heart failure Maternal Grandmother   . Cancer Maternal Grandfather   . Alcohol abuse Paternal Grandfather   . Arthritis Maternal Aunt   . Anxiety disorder Maternal Aunt   . Depression Maternal Aunt     Social History: Reviewed social history  from progress note on 02/10/2020 Social History   Socioeconomic History  . Marital status: Married    Spouse name: Not on file  . Number of children: 1  . Years of education: Not on file  . Highest education level: Not on file  Occupational History  . Occupation: Transport planner  Tobacco Use  . Smoking status: Former Smoker    Years: 10.00    Types: Cigarettes    Quit date: 08/18/2016    Years since quitting: 4.1  . Smokeless tobacco: Never Used  . Tobacco comment: 3 cigarettes a day  Vaping Use  . Vaping Use: Never used  Substance and Sexual Activity  . Alcohol use: Yes    Alcohol/week: 0.0 standard drinks    Comment: occasional  . Drug use: No  . Sexual activity: Yes    Birth control/protection: Pill  Other Topics Concern  . Not on file  Social History Narrative  . Not on file   Social Determinants of Health   Financial Resource Strain: Not on file  Food Insecurity: Not on file  Transportation Needs: Not on file  Physical Activity: Not on file  Stress: Not on file  Social Connections: Not on file    Allergies:  Allergies  Allergen Reactions  . Imitrex [Sumatriptan] Other (See Comments)    Vomiting, Slurred Speech and Facial Numbness    Metabolic Disorder Labs: Lab Results  Component Value Date   HGBA1C 5.8 06/28/2020   No results found for: PROLACTIN Lab Results  Component Value Date   CHOL 186 06/28/2020   TRIG 99.0 06/28/2020   HDL 51.00 06/28/2020   CHOLHDL 4 06/28/2020   VLDL 19.8 06/28/2020   LDLCALC 115 (H) 06/28/2020   LDLCALC 92 11/05/2018   Lab Results  Component Value Date   TSH 1.029 07/08/2020   TSH 1.10 08/15/2018    Therapeutic Level Labs: No results found for: LITHIUM No results found for: VALPROATE No components found for:  CBMZ  Current Medications: Current Outpatient Medications  Medication Sig Dispense Refill  . apixaban (ELIQUIS) 5 MG TABS tablet TAKE 1 TABLET (5 MG TOTAL) BY MOUTH 2 (TWO) TIMES DAILY. 60 tablet 2  .  aspirin-acetaminophen-caffeine (EXCEDRIN MIGRAINE) 973-532-99 MG tablet Take by mouth every 6 (six) hours as needed for headache. (Patient not taking: Reported on 10/12/2020)    . escitalopram (LEXAPRO) 20 MG tablet Take 1 tablet (20 mg total) by mouth daily. 90 tablet 0  . propranolol (INDERAL) 10 MG tablet Take 1 tablet (10 mg total) by mouth 2 (two) times daily as needed. For anxiety (Patient not taking: Reported on 10/12/2020) 60 tablet 1  . valACYclovir (VALTREX) 1000 MG tablet TAKE 2 TABLETS BY MOUTH TWICE DAILY FOR ONE DAY, AS NEEDED FOR MOUTH SORES. (Patient not taking: No sig reported) 12 tablet 0   No current facility-administered medications for this visit.     Musculoskeletal: Strength & Muscle Tone: UTA Gait & Station:  UTA Patient leans: N/A  Psychiatric Specialty Exam: Review of Systems  Psychiatric/Behavioral: Positive for dysphoric mood. The patient is nervous/anxious.   All other systems reviewed and are negative.   There were no vitals taken for this visit.There is no height or weight on file to calculate BMI.  General Appearance: Casual  Eye Contact:  Fair  Speech:  Clear and Coherent  Volume:  Normal  Mood:  Anxious and Depressed  Affect:  Congruent  Thought Process:  Goal Directed and Descriptions of Associations: Intact  Orientation:  Full (Time, Place, and Person)  Thought Content: Logical   Suicidal Thoughts:  No  Homicidal Thoughts:  No  Memory:  Immediate;   Fair Recent;   Fair Remote;   Fair  Judgement:  Fair  Insight:  Fair  Psychomotor Activity:  Normal  Concentration:  Concentration: Fair and Attention Span: Fair  Recall:  [employer] of Knowledge: Fair  Language: Fair  Akathisia:  No  Handed:  Right  AIMS (if indicated): UTA  Assets:  Communication Skills Desire for Improvement Housing Social Support Talents/Skills Transportation Vocational/Educational  ADL's:  Intact  Cognition: WNL  Sleep:  Fair   Screenings: GAD-7   Flowsheet Row  Video Visit from 10/12/2020 in [place] from 02/10/2020 in [place] Visit from 10/28/2017 in [place]  Total GAD-7 Score 5 3 4     PHQ2-9   Early Video Visit from 10/12/2020 in [place] from 02/10/2020 in [facility] from 11/12/2018 in [place] Visit from 10/28/2017 in [place] Visit from 06/19/2017 in [place]  PHQ-2 Total Score 2 1 0 0 2  PHQ-9 Total Score 8 -- -- 3 9    Flowsheet Row Video Visit from 10/12/2020 in [place] No Risk       Assessment and Plan: Autumn [name] is a 35 year old Caucasian female, employed, married, lives in [place], has a history of depression, anxiety was evaluated by telemedicine today.  Patient is biologically predisposed given her family history and history of trauma.  Patient with psychosocial stressors of the current pandemic, relationship struggles, mother's health problems.  Patient will continue to benefit from the following plan.  Plan MDD in remission Lexapro 20 mg p.o. daily Continue CBT with Ms. Dayle Points. PHQ-9 today equals 8  Anxiety disorder unspecified-rule out generalized anxiety disorder-improving Continue Lexapro as prescribed Continue CBT with Ms. Dayle Points  Discussed drug to drug interaction with Lexapro and Eliquis.  I have reviewed notes per Dr. Irene Shipper 10/03/2020-oncology- patient with bilateral pulmonary embolism, chronic anticoagulation and balance problems.  I have also reviewed notes per cardiology Ms. Kathlen Mody -dated 08/19/2020-patient with palpitation, bilateral pulmonary embolism and chest pain- heart monitor showed SR with rare PACs and PVCs triggered events where sinus tachycardia.   Follow-up in 3 months.  Follow-up in 2 to 3 months or sooner if needed.  This note was generated in part or whole with voice recognition software. Voice recognition is usually quite accurate but there are transcription errors that can and very often do occur. I apologize for any typographical errors that were not detected and corrected.        Ursula Alert, MD 10/13/2020, 3:00 PM

## 2020-10-24 ENCOUNTER — Other Ambulatory Visit: Payer: Self-pay

## 2020-10-25 ENCOUNTER — Encounter: Payer: Self-pay | Admitting: Primary Care

## 2020-10-25 ENCOUNTER — Encounter: Payer: Self-pay | Admitting: Neurology

## 2020-10-25 ENCOUNTER — Ambulatory Visit (INDEPENDENT_AMBULATORY_CARE_PROVIDER_SITE_OTHER): Payer: 59 | Admitting: Primary Care

## 2020-10-25 ENCOUNTER — Other Ambulatory Visit: Payer: Self-pay

## 2020-10-25 VITALS — BP 124/88 | HR 96 | Temp 98.7°F | Ht 70.0 in | Wt 247.0 lb

## 2020-10-25 DIAGNOSIS — R2689 Other abnormalities of gait and mobility: Secondary | ICD-10-CM

## 2020-10-25 DIAGNOSIS — H539 Unspecified visual disturbance: Secondary | ICD-10-CM | POA: Diagnosis not present

## 2020-10-25 HISTORY — DX: Other abnormalities of gait and mobility: R26.89

## 2020-10-25 NOTE — Progress Notes (Signed)
Subjective:    Patient ID: Autumn [name], female    DOB: 23-Jun-1985, 35 y.o.   MRN: [id number]  HPI  Autumn [name] is a very pleasant 35 y.o. female with a history of migraines, pulmonary embolism secondary to Covid-19 infection, GAD, MDD who presents today to discuss balancing issues.   Imbalance when standing and changing positions, present for the last 2-3 months. She's noticed herself leaning to the right side when standing or with position changes such as bending forward. She's found herself stumbling to catch her balance during these scenarios.  Overall these symptoms have been intermittent, may be a few times monthly lasting for a few seconds.    She was getting out of bed a few days ago, her body leaned to the right so she had to brace her hand on the wall. Today she noticed visual abnormality when looking at the computer screen at work, felt that the computer screen was "split". She has noticed some difficulty focusing recently. She's also experienced 2 different episodes of an odd sensation to her head.the first sensation was a heaviness/pressure feeling that lasted for a few seconds, and the second episode was a fullness sensation that came across the bilateral occipital lobes with radiation to her parietal lobes to the frontal lobes.   Her last eye exam was less than one year ago which was stable. She denies dizziness, changes in sensory feeling, changes in speech, headaches, bilateral/unilateral numbness to lower extremities.  She does notice right upper extremity radiculopathy but attributes this to her chronic neck pain.  She was evaluated by her hematologist in late April 2022 who conducted a Romberg test which was positive.  It was recommended she follow-up with her PCP for neurology work-up.  Today endorses being under a tremendous amount of stress since early 2022, she is not sure if this is contributing.  She is compliant to her Lexapro and therapy.  Review of  Systems  Eyes: Positive for visual disturbance.  Neurological: Positive for light-headedness. Negative for weakness and numbness.       Imbalance  Psychiatric/Behavioral:       See HPI         Past Medical History:  Diagnosis Date  . Anxiety   . Back pain    chronic  . Bilateral pulmonary embolism ([place])    a. 06/2020 following COVID infection.  Marland Kitchen COVID-19 virus infection 06/2020  . Depression   . Disc disease, degenerative, lumbar or lumbosacral   . History of echocardiogram    a. 06/2020 Echo: EF 55-60%, no rwma, nl RV size/fxn.  Marland Kitchen History of ovarian cyst   . Migraine   . Palpitations    a. 2/022 Zio: RSR, 85 (47-112). Rare PACs/PVCs. No significant arrhythmias/prolonged pauses.  . Prediabetes   . Scoliosis   . Shingles 06/20/2018    Social History   Socioeconomic History  . Marital status: Married    Spouse name: Not on file  . Number of children: 1  . Years of education: Not on file  . Highest education level: Not on file  Occupational History  . Occupation: Transport planner  Tobacco Use  . Smoking status: Former Smoker    Years: 10.00    Types: Cigarettes    Quit date: 08/18/2016    Years since quitting: 4.1  . Smokeless tobacco: Never Used  . Tobacco comment: 3 cigarettes a day  Vaping Use  . Vaping Use: Never used  Substance and Sexual Activity  .  Alcohol use: Yes    Alcohol/week: 0.0 standard drinks    Comment: occasional  . Drug use: No  . Sexual activity: Yes    Birth control/protection: Pill  Other Topics Concern  . Not on file  Social History Narrative  . Not on file   Social Determinants of Health   Financial Resource Strain: Not on file  Food Insecurity: Not on file  Transportation Needs: Not on file  Physical Activity: Not on file  Stress: Not on file  Social Connections: Not on file  Intimate Partner Violence: Not on file    Past Surgical History:  Procedure Laterality Date  . APPENDECTOMY    . BREAST SURGERY Right 2008   lump/  benign    Family History  Problem Relation Age of Onset  . Cancer Mother 57       breast  . Breast cancer Mother 32       and 65  . Alcohol abuse Father   . Vision loss Maternal Grandmother   . Heart failure Maternal Grandmother   . Cancer Maternal Grandfather   . Alcohol abuse Paternal Grandfather   . Arthritis Maternal Aunt   . Anxiety disorder Maternal Aunt   . Depression Maternal Aunt     Allergies  Allergen Reactions  . Imitrex [Sumatriptan] Other (See Comments)    Vomiting, Slurred Speech and Facial Numbness    Current Outpatient Medications on File Prior to Visit  Medication Sig Dispense Refill  . apixaban (ELIQUIS) 5 MG TABS tablet TAKE 1 TABLET (5 MG TOTAL) BY MOUTH 2 (TWO) TIMES DAILY. 60 tablet 2  . aspirin-acetaminophen-caffeine (EXCEDRIN MIGRAINE) 163-846-65 MG tablet Take by mouth every 6 (six) hours as needed for headache.    . escitalopram (LEXAPRO) 20 MG tablet Take 1 tablet (20 mg total) by mouth daily. 90 tablet 0  . valACYclovir (VALTREX) 1000 MG tablet TAKE 2 TABLETS BY MOUTH TWICE DAILY FOR ONE DAY, AS NEEDED FOR MOUTH SORES. 12 tablet 0  . [DISCONTINUED] pantoprazole (PROTONIX) 40 MG tablet Take 1 tablet (40 mg total) by mouth daily. 30 tablet 0  . [DISCONTINUED] promethazine (PHENERGAN) 25 MG tablet Take 1 tablet (25 mg total) by mouth every 8 (eight) hours as needed for nausea or vomiting (migraine). 20 tablet 0  . [DISCONTINUED] sucralfate (CARAFATE) 1 g tablet Take 1 tablet (1 g total) by mouth 4 (four) times daily for 5 days. 20 tablet 0   No current facility-administered medications on file prior to visit.    BP 124/88   Pulse 96   Temp 98.7 F (37.1 C) (Temporal)   Ht 5\' 10"  (1.778 m)   Wt 247 lb (112 kg)   SpO2 99%   BMI 35.44 kg/m  Objective:   Physical Exam Cardiovascular:     Rate and Rhythm: Normal rate and regular rhythm.  Pulmonary:     Effort: Pulmonary effort is normal.  Neurological:     Mental Status: She is alert and  oriented to person, place, and time.     Sensory: No sensory deficit.     Coordination: Coordination normal. Finger-Nose-Finger Test and Heel to [place] Test normal.     Comments: Right-sided leaning during Romberg test.  Able to catch herself and balance.           Assessment & Plan:      This visit occurred during the SARS-CoV-2 public health emergency.  Safety protocols were in place, including screening questions prior to the visit, additional usage of  staff PPE, and extensive cleaning of exam room while observing appropriate contact time as indicated for disinfecting solutions.

## 2020-10-25 NOTE — Assessment & Plan Note (Signed)
Acute for the last 2 to 3 months, continued. Neuro exam overall unremarkable today except for leaning to the right during a Romberg test.  Given her age coupled with visual disturbance and imbalance, will refer to neurology for evaluation.  Recent labs were reviewed and were grossly unremarkable.  Consider anxiety/stress to be playing a role.  We also discussed that if her symptoms progress quickly between now and her neurology visit that we would obtain some imaging of her head.  She agreed.

## 2020-11-01 ENCOUNTER — Other Ambulatory Visit: Payer: Self-pay | Admitting: Oncology

## 2020-11-01 ENCOUNTER — Other Ambulatory Visit: Payer: Self-pay

## 2020-11-01 ENCOUNTER — Other Ambulatory Visit: Payer: Self-pay | Admitting: Primary Care

## 2020-11-01 DIAGNOSIS — B009 Herpesviral infection, unspecified: Secondary | ICD-10-CM

## 2020-11-01 MED FILL — Apixaban Tab 5 MG: ORAL | 30 days supply | Qty: 60 | Fill #0 | Status: AC

## 2020-11-02 ENCOUNTER — Other Ambulatory Visit: Payer: Self-pay

## 2020-11-02 MED FILL — Valacyclovir HCl Tab 1 GM: ORAL | 3 days supply | Qty: 12 | Fill #0 | Status: AC

## 2020-11-09 ENCOUNTER — Ambulatory Visit (INDEPENDENT_AMBULATORY_CARE_PROVIDER_SITE_OTHER): Payer: 59 | Admitting: Psychology

## 2020-11-09 DIAGNOSIS — F411 Generalized anxiety disorder: Secondary | ICD-10-CM

## 2020-11-15 NOTE — Progress Notes (Signed)
NEUROLOGY CONSULTATION NOTE  Autumn [name] MRN: [id number] DOB: Apr 02, 1986  Referring provider: Alma Friendly, NP Primary care provider: Alma Friendly, NP  Reason for consult:  Balance disorder, visual disturbance  Assessment/Plan:    Abnormality of balance - with veering towards the right.  No extremity ataxia/dysmetria.  Would suspect cerebellar etiology.  Episodic head pressure  1.Check MRI of brain with and without contrast 2.Further recommendations pending results.    Subjective:  Autumn [name] is a 35 year old right-handed female with migraines, DDD of lumbar spine, depression and anxiety and history of bilateral PE following COVID infection who presents for balance disorder and visual disturbance.  History supplemented by referring provider's note.  About 3 months ago, she started having balance problems.  She may suddenly feel herself leaning towards the right.  While walking, she may start veering towards the right.  Sometimes if she bends over to pick something up, she may start leaning over to the right.  When she parks the car, she may park it close to or a little over the right line, but she doesn't veer while driving.  This doesn't happen all of the time.  No associated dizziness/vertigo.  If she stares or concentrates on something, such as looking at the computer, the image may slightly cross.  She denies difficulty with depth perception or reaching for objects with either hand.  Denies hearing loss, tinnitus, aural fullness.  On three separate occasions, she feels a sensation beginning from back of neck and spreading over her head to the front, lasting 2 to 3 seconds.  Afterwards, her vision was not focused, lasting several seconds.  On one occasion, she felt dizzy and nauseous for 45 minutes.  These events are different than her migraines, which are right temporal/retro-orbital pain with some nausea, occurs infrequently (only one since beginning in the  year).  Sometimes she feels she has to focus more when she looks.  Eye exam over the past year was normal.    She had Covid in December and in January had multiple bilateral PEs.  Currently on Eliquis.  She is not aware of any family history of neurologic conditions.     PAST MEDICAL HISTORY: Past Medical History:  Diagnosis Date   Anxiety    Back pain    chronic   Bilateral pulmonary embolism ([place])    a. 06/2020 following COVID infection.   COVID-19 virus infection 06/2020   Depression    Disc disease, degenerative, lumbar or lumbosacral    History of echocardiogram    a. 06/2020 Echo: EF 55-60%, no rwma, nl RV size/fxn.   History of ovarian cyst    Migraine    Palpitations    a. 2/022 Zio: RSR, 85 (47-112). Rare PACs/PVCs. No significant arrhythmias/prolonged pauses.   Prediabetes    Scoliosis    Shingles 06/20/2018    PAST SURGICAL HISTORY: Past Surgical History:  Procedure Laterality Date   APPENDECTOMY     BREAST SURGERY Right 2008   lump/ benign    MEDICATIONS: Current Outpatient Medications on File Prior to Visit  Medication Sig Dispense Refill   aspirin-acetaminophen-caffeine (EXCEDRIN MIGRAINE) 250-250-65 MG tablet Take by mouth every 6 (six) hours as needed for headache.     apixaban (ELIQUIS) 5 MG TABS tablet TAKE 1 TABLET (5 MG TOTAL) BY MOUTH 2 (TWO) TIMES DAILY. 60 tablet 1   escitalopram (LEXAPRO) 20 MG tablet Take 1 tablet (20 mg total) by mouth daily. 90 tablet 0   valACYclovir (  VALTREX) 1000 MG tablet TAKE 2 TABLETS BY MOUTH TWICE DAILY FOR ONE DAY, AS NEEDED FOR MOUTH SORES. 12 tablet 0   [DISCONTINUED] pantoprazole (PROTONIX) 40 MG tablet Take 1 tablet (40 mg total) by mouth daily. 30 tablet 0   [DISCONTINUED] promethazine (PHENERGAN) 25 MG tablet Take 1 tablet (25 mg total) by mouth every 8 (eight) hours as needed for nausea or vomiting (migraine). 20 tablet 0   [DISCONTINUED] sucralfate (CARAFATE) 1 g tablet Take 1 tablet (1 g total) by mouth 4 (four)  times daily for 5 days. 20 tablet 0   No current facility-administered medications on file prior to visit.    ALLERGIES: Allergies  Allergen Reactions   Imitrex [Sumatriptan] Other (See Comments)    Vomiting, Slurred Speech and Facial Numbness    FAMILY HISTORY: Family History  Problem Relation Age of Onset   Cancer Mother 36       breast   Breast cancer Mother 70       and 40   Alcohol abuse Father    Vision loss Maternal Grandmother    Heart failure Maternal Grandmother    Cancer Maternal Grandfather    Alcohol abuse Paternal Grandfather    Arthritis Maternal Aunt    Anxiety disorder Maternal Aunt    Depression Maternal Aunt     Objective:  Blood pressure 114/78, pulse 75, height 5\' 10"  (1.778 m), weight 249 lb 12.8 oz (113.3 kg), SpO2 98 %. General: No acute distress.  Patient appears well-groomed.   Head:  Normocephalic/atraumatic Eyes:  fundi examined but not visualized Neck: supple, no paraspinal tenderness, full range of motion Back: No paraspinal tenderness Heart: regular rate and rhythm Lungs: Clear to auscultation bilaterally. Vascular: No carotid bruits. Neurological Exam: Mental status: alert and oriented to person, place, and time, recent and remote memory intact, fund of knowledge intact, attention and concentration intact, speech fluent and not dysarthric, language intact. Cranial nerves: CN I: not tested CN II: pupils equal, round and reactive to light, visual fields intact CN III, IV, VI:  full range of motion, no nystagmus, no ptosis CN V: facial sensation intact. CN VII: upper and lower face symmetric CN VIII: hearing intact CN IX, X: gag intact, uvula midline CN XI: sternocleidomastoid and trapezius muscles intact CN XII: tongue midline Bulk & Tone: normal, no fasciculations. Motor:  muscle strength 5/5 throughout Sensation:  Reduced pinprick sensation in first toe on left (due to injury), otherwise pinprick, temperature and vibratory sensation  intact. Deep Tendon Reflexes:  2+ throughout,  toes downgoing.   Finger to nose testing:  Without dysmetria.   Heel to shin:  Without dysmetria.   Gait:  Steady station and stride but does veer towards the right.  Able to turn and tandem walk  Romberg positive to the right.    Thank you for allowing me to take part in the care of this patient.  Metta Clines, DO  CC: Alma Friendly, NP

## 2020-11-17 ENCOUNTER — Encounter: Payer: Self-pay | Admitting: Neurology

## 2020-11-17 ENCOUNTER — Ambulatory Visit: Payer: 59 | Admitting: Neurology

## 2020-11-17 ENCOUNTER — Other Ambulatory Visit: Payer: Self-pay

## 2020-11-17 VITALS — BP 114/78 | HR 75 | Ht 70.0 in | Wt 249.8 lb

## 2020-11-17 DIAGNOSIS — R270 Ataxia, unspecified: Secondary | ICD-10-CM

## 2020-11-17 DIAGNOSIS — R519 Headache, unspecified: Secondary | ICD-10-CM | POA: Diagnosis not present

## 2020-11-17 NOTE — Patient Instructions (Signed)
Will order MRI of brain with and without contrast Further recommendations pending results.

## 2020-11-22 ENCOUNTER — Ambulatory Visit (INDEPENDENT_AMBULATORY_CARE_PROVIDER_SITE_OTHER): Payer: 59 | Admitting: Psychology

## 2020-11-22 ENCOUNTER — Ambulatory Visit: Payer: 59 | Admitting: Medical

## 2020-11-22 DIAGNOSIS — F411 Generalized anxiety disorder: Secondary | ICD-10-CM

## 2020-11-24 ENCOUNTER — Other Ambulatory Visit: Payer: 59

## 2020-11-25 ENCOUNTER — Other Ambulatory Visit: Payer: Self-pay

## 2020-11-25 DIAGNOSIS — B029 Zoster without complications: Secondary | ICD-10-CM

## 2020-11-25 MED ORDER — VALACYCLOVIR HCL 1 G PO TABS
1000.0000 mg | ORAL_TABLET | Freq: Three times a day (TID) | ORAL | 0 refills | Status: DC
  Filled 2020-11-25: qty 21, 7d supply, fill #0

## 2020-11-26 ENCOUNTER — Other Ambulatory Visit: Payer: Self-pay

## 2020-11-28 ENCOUNTER — Other Ambulatory Visit: Payer: Self-pay

## 2020-12-01 ENCOUNTER — Other Ambulatory Visit: Payer: Self-pay

## 2020-12-01 ENCOUNTER — Ambulatory Visit
Admission: RE | Admit: 2020-12-01 | Discharge: 2020-12-01 | Disposition: A | Discharge status: Home / Self Care | Payer: 59 | Source: Ambulatory Visit | Attending: Neurology | Admitting: Neurology

## 2020-12-01 DIAGNOSIS — R270 Ataxia, unspecified: Secondary | ICD-10-CM

## 2020-12-01 DIAGNOSIS — S0990XA Unspecified injury of head, initial encounter: Secondary | ICD-10-CM | POA: Diagnosis not present

## 2020-12-01 MED ORDER — GADOBENATE DIMEGLUMINE 529 MG/ML IV SOLN
20.0000 mL | Freq: Once | INTRAVENOUS | Status: AC | PRN
  Administered 2020-12-01: 20 mL via INTRAVENOUS

## 2020-12-01 MED FILL — Apixaban Tab 5 MG: ORAL | 30 days supply | Qty: 60 | Fill #1 | Status: AC

## 2020-12-02 ENCOUNTER — Other Ambulatory Visit: Payer: Self-pay | Admitting: Primary Care

## 2020-12-02 DIAGNOSIS — J029 Acute pharyngitis, unspecified: Secondary | ICD-10-CM

## 2020-12-02 LAB — POCT RAPID STREP A (OFFICE): Rapid Strep A Screen: NEGATIVE

## 2020-12-06 ENCOUNTER — Encounter: Payer: Self-pay | Admitting: Family Medicine

## 2020-12-06 ENCOUNTER — Other Ambulatory Visit: Payer: Self-pay

## 2020-12-06 ENCOUNTER — Ambulatory Visit (INDEPENDENT_AMBULATORY_CARE_PROVIDER_SITE_OTHER): Payer: 59 | Admitting: Family Medicine

## 2020-12-06 VITALS — BP 118/80 | HR 102 | Temp 96.9°F | Ht 70.0 in | Wt 250.1 lb

## 2020-12-06 DIAGNOSIS — H6591 Unspecified nonsuppurative otitis media, right ear: Secondary | ICD-10-CM | POA: Diagnosis not present

## 2020-12-06 DIAGNOSIS — R059 Cough, unspecified: Secondary | ICD-10-CM | POA: Diagnosis not present

## 2020-12-06 DIAGNOSIS — J069 Acute upper respiratory infection, unspecified: Secondary | ICD-10-CM | POA: Insufficient documentation

## 2020-12-06 HISTORY — DX: Unspecified nonsuppurative otitis media, right ear: H65.91

## 2020-12-06 MED ORDER — HYDROCOD POLST-CPM POLST ER 10-8 MG/5ML PO SUER
5.0000 mL | Freq: Two times a day (BID) | ORAL | 0 refills | Status: DC | PRN
  Filled 2020-12-06: qty 50, 5d supply, fill #0

## 2020-12-06 MED ORDER — PREDNISONE 10 MG PO TABS
ORAL_TABLET | ORAL | 0 refills | Status: DC
  Filled 2020-12-06: qty 30, 30d supply, fill #0

## 2020-12-06 MED ORDER — AMOXICILLIN-POT CLAVULANATE 875-125 MG PO TABS
1.0000 | ORAL_TABLET | Freq: Two times a day (BID) | ORAL | 0 refills | Status: DC
  Filled 2020-12-06: qty 14, 7d supply, fill #0

## 2020-12-06 NOTE — Assessment & Plan Note (Signed)
For 5-6 days and now OM on R and sinus pain /laryngitis  Strep and covid tests neg Disc symptom care Rest voice  Fluids  Hycodan for cough/caution of sedation  Prednisone 40 mg taper  Update if not starting to improve in a week or if worsening

## 2020-12-06 NOTE — Assessment & Plan Note (Signed)
With viral uri  augmentin px for 7 d  Also prednisone for congestion and voice and throat pain  Update if not starting to improve in a week or if worsening

## 2020-12-06 NOTE — Assessment & Plan Note (Signed)
With chest soreness but no wheeze /bronchospasm on exam  Also hoarse voice Hycodan and prednisone px Update if not starting to improve in a week or if worsening  inst to call/go to ER if sob

## 2020-12-06 NOTE — Patient Instructions (Signed)
Try the hycodan with caution of sedation   Take augmentin for ear/sinus infection  Prednisone for throat pain and voice and congestion and cough   Drink lots of fluids Rest when you can  When not working-minimize talking if possible   Update if not starting to improve in a week or if worsening

## 2020-12-06 NOTE — Progress Notes (Signed)
Subjective:    Patient ID: Autumn [name], female    DOB: Oct 20, 1985, 35 y.o.   MRN: [id number]  This visit occurred during the SARS-CoV-2 public health emergency.  Safety protocols were in place, including screening questions prior to the visit, additional usage of staff PPE, and extensive cleaning of exam room while observing appropriate contact time as indicated for disinfecting solutions.   HPI Pt presents with cough and hoarse voice  Wt Readings from Last 3 Encounters:  12/06/20 250 lb 1 oz (113.4 kg)  11/17/20 249 lb 12.8 oz (113.3 kg)  10/25/20 247 lb (112 kg)   35.88 kg/m  Scratchy throat -Thursday  Then ST and coughing up colored phlegm Strep test neg  Ears full and popping  R ear hurts when she blows nose Worse today -makes her a little dizzy Bad taste in mouth  Bloody musous from nose   Some chills/body aches  99.2 temp 6/24-nothing since  Burns in chest when she coughs   No wheezing    Very hoarse voice   Otc Gargling Flonase Day quil   Co workers have been sick   Neg covid test Saturday  Covid immunized w/o booster   Just had shingles a week ago  Had covid with blood clot  ? Small stroke on MRI -in w/u for that  Tough year   Patient Active Problem List   Diagnosis Date Noted   OME (otitis media with effusion), right 12/06/2020   Viral URI with cough 12/06/2020   Imbalance 10/25/2020   Tachycardia 07/08/2020   Palpitations 07/08/2020   Prediabetes 06/28/2020   Bilateral pulmonary embolism ([place]) 06/22/2020   Lung nodule 06/22/2020   Leukocytosis 06/22/2020   Migraine 04/08/2020   MDD (major depressive disorder), recurrent, in full remission ([place]) 02/10/2020   Anxiety disorder 02/10/2020   Left carpal tunnel syndrome 01/12/2020   Left upper quadrant abdominal pain 10/23/2019   Paresthesia 06/30/2019   Post-infection bronchospasm 03/27/2019   Cough 03/04/2019   Chronic neck and back pain 04/16/2018   Nonallopathic lesion of  cervical region 04/16/2018   Nonallopathic lesion of rib cage 04/16/2018   Nonallopathic lesion of thoracic region 04/16/2018   Contraception management 08/30/2016   Family history of breast cancer in mother 08/30/2016   Well woman exam with routine gynecological exam 08/19/2014   Obesity (BMI 30.0-34.9) 08/19/2014   Amenorrhea 12/25/2013   Anxiety and depression 07/15/2012   Herpes simplex virus (HSV) infection 11/01/2009   Past Medical History:  Diagnosis Date   Anxiety    Back pain    chronic   Bilateral pulmonary embolism ([place])    a. 06/2020 following COVID infection.   COVID-19 virus infection 06/2020   Depression    Disc disease, degenerative, lumbar or lumbosacral    History of echocardiogram    a. 06/2020 Echo: EF 55-60%, no rwma, nl RV size/fxn.   History of ovarian cyst    Migraine    Palpitations    a. 2/022 Zio: RSR, 85 (47-112). Rare PACs/PVCs. No significant arrhythmias/prolonged pauses.   Prediabetes    Scoliosis    Shingles 06/20/2018   Past Surgical History:  Procedure Laterality Date   APPENDECTOMY     BREAST SURGERY Right 2008   lump/ benign   Social History   Tobacco Use   Smoking status: Former    Years: 10.00    Pack years: 0.00    Types: Cigarettes    Quit date: 08/18/2016    Years since quitting: 4.3  Smokeless tobacco: Never   Tobacco comments:    3 cigarettes a day  Vaping Use   Vaping Use: Never used  Substance Use Topics   Alcohol use: Yes    Alcohol/week: 0.0 standard drinks    Comment: occasional   Drug use: No   Family History  Problem Relation Age of Onset   Cancer Mother 28       breast   Breast cancer Mother 27       and 41   Alcohol abuse Father    Vision loss Maternal Grandmother    Heart failure Maternal Grandmother    Cancer Maternal Grandfather    Alcohol abuse Paternal Grandfather    Arthritis Maternal Aunt    Anxiety disorder Maternal Aunt    Depression Maternal Aunt    Allergies  Allergen Reactions    Imitrex [Sumatriptan] Other (See Comments)    Vomiting, Slurred Speech and Facial Numbness   Current Outpatient Medications on File Prior to Visit  Medication Sig Dispense Refill   apixaban (ELIQUIS) 5 MG TABS tablet TAKE 1 TABLET (5 MG TOTAL) BY MOUTH 2 (TWO) TIMES DAILY. 60 tablet 1   escitalopram (LEXAPRO) 20 MG tablet Take 1 tablet (20 mg total) by mouth daily. 90 tablet 0   No current facility-administered medications on file prior to visit.    Review of Systems  Constitutional:  Positive for fatigue. Negative for activity change, appetite change, fever and unexpected weight change.  HENT:  Positive for congestion, rhinorrhea, sinus pressure, sore throat and voice change. Negative for ear pain, sinus pain and trouble swallowing.   Eyes:  Negative for pain, redness and visual disturbance.  Respiratory:  Positive for cough. Negative for shortness of breath, wheezing and stridor.   Cardiovascular:  Negative for chest pain and palpitations.  Gastrointestinal:  Negative for abdominal pain, blood in stool, constipation and diarrhea.  Endocrine: Negative for polydipsia and polyuria.  Genitourinary:  Negative for dysuria, frequency and urgency.  Musculoskeletal:  Negative for arthralgias, back pain and myalgias.  Skin:  Negative for pallor and rash.  Allergic/Immunologic: Negative for environmental allergies.  Neurological:  Negative for dizziness, syncope and headaches.  Hematological:  Negative for adenopathy. Does not bruise/bleed easily.  Psychiatric/Behavioral:  Negative for decreased concentration and dysphoric mood. The patient is not nervous/anxious.       Objective:   Physical Exam Constitutional:      General: She is not in acute distress.    Appearance: Normal appearance. She is obese. She is not ill-appearing.  HENT:     Head: Normocephalic and atraumatic.     Comments: No sinus tenderness    Right Ear: There is no impacted cerumen.     Left Ear: Tympanic membrane, ear  canal and external ear normal. There is no impacted cerumen.     Ears:     Comments: R TM is dull with erythema and effusion but not bulging  Canal is clear     Nose: Congestion and rhinorrhea present.     Mouth/Throat:     Mouth: Mucous membranes are moist.     Pharynx: Posterior oropharyngeal erythema present. No oropharyngeal exudate.     Comments: Very hoarse voice  Eyes:     General:        Right eye: No discharge.        Left eye: No discharge.     Conjunctiva/sclera: Conjunctivae normal.     Pupils: Pupils are equal, round, and reactive to light.  Cardiovascular:  Rate and Rhythm: Regular rhythm. Tachycardia present.     Heart sounds: Normal heart sounds.  Pulmonary:     Effort: Pulmonary effort is normal. No respiratory distress.     Breath sounds: Normal breath sounds. No stridor. No wheezing, rhonchi or rales.     Comments: Good air exch No wheeze even on forced exp Musculoskeletal:     Cervical back: Normal range of motion and neck supple.  Lymphadenopathy:     Cervical: No cervical adenopathy.  Skin:    General: Skin is warm and dry.     Coloration: Skin is not pale.     Findings: No erythema or rash.  Neurological:     Mental Status: She is alert.  Psychiatric:        Mood and Affect: Mood normal.          Assessment & Plan:   Problem List Items Addressed This Visit       Respiratory   Viral URI with cough    For 5-6 days and now OM on R and sinus pain /laryngitis  Strep and covid tests neg Disc symptom care Rest voice  Fluids  Hycodan for cough/caution of sedation  Prednisone 40 mg taper  Update if not starting to improve in a week or if worsening            Nervous and Auditory   OME (otitis media with effusion), right - Primary    With viral uri  augmentin px for 7 d  Also prednisone for congestion and voice and throat pain  Update if not starting to improve in a week or if worsening          Relevant Medications    amoxicillin-clavulanate (AUGMENTIN) 875-125 MG tablet     Other   Cough    With chest soreness but no wheeze /bronchospasm on exam  Also hoarse voice Hycodan and prednisone px Update if not starting to improve in a week or if worsening  inst to call/go to ER if sob       Relevant Medications   chlorpheniramine-HYDROcodone (TUSSIONEX PENNKINETIC ER) 10-8 MG/5ML SUER

## 2020-12-07 ENCOUNTER — Telehealth: Payer: Self-pay

## 2020-12-07 ENCOUNTER — Other Ambulatory Visit: Payer: Self-pay

## 2020-12-07 ENCOUNTER — Telehealth: Payer: Self-pay | Admitting: Internal Medicine

## 2020-12-07 DIAGNOSIS — R519 Headache, unspecified: Secondary | ICD-10-CM

## 2020-12-07 DIAGNOSIS — R270 Ataxia, unspecified: Secondary | ICD-10-CM

## 2020-12-07 DIAGNOSIS — G43701 Chronic migraine without aura, not intractable, with status migrainosus: Secondary | ICD-10-CM

## 2020-12-07 NOTE — Telephone Encounter (Signed)
Heather calling from [facility] requesting to order TEE, please assist

## 2020-12-07 NOTE — Telephone Encounter (Signed)
Patient calling  States that PCP ordered an Echo TEE  Please call to schedule

## 2020-12-07 NOTE — Telephone Encounter (Signed)
Pt has been scheduled to see Dr. Saunders Revel tomorrow 12/08/20.

## 2020-12-07 NOTE — Telephone Encounter (Signed)
Pt called and informed that orders placed for urgent TEE because she is scheduled to be taken off of Eliquis in 2 weeks. also want to check lipid panel and CTA head and neck. Pt verbalized understanding, orders placed in epic

## 2020-12-07 NOTE — Telephone Encounter (Signed)
Will need to schedule office visit. Attempted to call pt to schedule.  No answer. Lmtcb.

## 2020-12-07 NOTE — Telephone Encounter (Signed)
-----   Message from Pieter Partridge, DO sent at 12/05/2020 12:08 PM EDT ----- I already spoke with patient regarding MRI results.  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  Unsure if related to her balance problems, but possible.  I would like to get an urgent TEE because she is scheduled to be taken off of Eliquis in 2 weeks.  I also want to check lipid panel and CTA head and neck.

## 2020-12-08 ENCOUNTER — Ambulatory Visit (INDEPENDENT_AMBULATORY_CARE_PROVIDER_SITE_OTHER): Payer: 59 | Admitting: Primary Care

## 2020-12-08 ENCOUNTER — Ambulatory Visit: Payer: 59 | Admitting: Internal Medicine

## 2020-12-08 ENCOUNTER — Encounter: Payer: Self-pay | Admitting: Internal Medicine

## 2020-12-08 ENCOUNTER — Other Ambulatory Visit
Admission: RE | Admit: 2020-12-08 | Discharge: 2020-12-08 | Disposition: A | Discharge status: Home / Self Care | Payer: 59 | Source: Ambulatory Visit | Attending: Internal Medicine | Admitting: Internal Medicine

## 2020-12-08 ENCOUNTER — Encounter: Payer: Self-pay | Admitting: Primary Care

## 2020-12-08 ENCOUNTER — Other Ambulatory Visit: Payer: Self-pay

## 2020-12-08 VITALS — BP 110/84 | HR 78 | Ht 70.0 in | Wt 249.0 lb

## 2020-12-08 VITALS — BP 118/76 | HR 78 | Temp 97.8°F | Ht 70.0 in | Wt 249.0 lb

## 2020-12-08 DIAGNOSIS — I639 Cerebral infarction, unspecified: Secondary | ICD-10-CM

## 2020-12-08 DIAGNOSIS — R519 Headache, unspecified: Secondary | ICD-10-CM

## 2020-12-08 DIAGNOSIS — Z86711 Personal history of pulmonary embolism: Secondary | ICD-10-CM | POA: Diagnosis not present

## 2020-12-08 DIAGNOSIS — G43701 Chronic migraine without aura, not intractable, with status migrainosus: Secondary | ICD-10-CM

## 2020-12-08 DIAGNOSIS — Z1159 Encounter for screening for other viral diseases: Secondary | ICD-10-CM

## 2020-12-08 DIAGNOSIS — I6381 Other cerebral infarction due to occlusion or stenosis of small artery: Secondary | ICD-10-CM | POA: Diagnosis not present

## 2020-12-08 DIAGNOSIS — F32A Depression, unspecified: Secondary | ICD-10-CM

## 2020-12-08 DIAGNOSIS — R7303 Prediabetes: Secondary | ICD-10-CM

## 2020-12-08 DIAGNOSIS — F3342 Major depressive disorder, recurrent, in full remission: Secondary | ICD-10-CM

## 2020-12-08 DIAGNOSIS — Z0000 Encounter for general adult medical examination without abnormal findings: Secondary | ICD-10-CM

## 2020-12-08 DIAGNOSIS — R270 Ataxia, unspecified: Secondary | ICD-10-CM

## 2020-12-08 DIAGNOSIS — I2699 Other pulmonary embolism without acute cor pulmonale: Secondary | ICD-10-CM | POA: Diagnosis not present

## 2020-12-08 DIAGNOSIS — E785 Hyperlipidemia, unspecified: Secondary | ICD-10-CM

## 2020-12-08 DIAGNOSIS — G43709 Chronic migraine without aura, not intractable, without status migrainosus: Secondary | ICD-10-CM

## 2020-12-08 DIAGNOSIS — F419 Anxiety disorder, unspecified: Secondary | ICD-10-CM

## 2020-12-08 HISTORY — DX: Other cerebral infarction due to occlusion or stenosis of small artery: I63.81

## 2020-12-08 LAB — LIPID PANEL
Cholesterol: 146 mg/dL (ref 0–200)
HDL: 50.8 mg/dL (ref >39.00)
LDL Cholesterol: 77 mg/dL (ref 0–99)
NonHDL: 95.41
Total CHOL/HDL Ratio: 3
Triglycerides: 94 mg/dL (ref 0.0–149.0)
VLDL: 18.8 mg/dL (ref 0.0–40.0)

## 2020-12-08 LAB — CBC
HCT: 41.2 % (ref 36.0–46.0)
Hemoglobin: 13.9 g/dL (ref 12.0–15.0)
MCH: 31.2 pg (ref 26.0–34.0)
MCHC: 33.7 g/dL (ref 30.0–36.0)
MCV: 92.6 fL (ref 80.0–100.0)
Platelets: 317 10*3/uL (ref 150–400)
RBC: 4.45 MIL/uL (ref 3.87–5.11)
RDW: 12.8 % (ref 11.5–15.5)
WBC: 11.3 10*3/uL — ABNORMAL HIGH (ref 4.0–10.5)
nRBC: 0 % (ref 0.0–0.2)

## 2020-12-08 LAB — BASIC METABOLIC PANEL
Anion gap: 10 (ref 5–15)
BUN: 15 mg/dL (ref 6–20)
CO2: 24 mmol/L (ref 22–32)
Calcium: 9.3 mg/dL (ref 8.9–10.3)
Chloride: 104 mmol/L (ref 98–111)
Creatinine, Ser: 0.67 mg/dL (ref 0.44–1.00)
GFR, Estimated: >60 mL/min (ref >60)
Glucose, Bld: 113 mg/dL — ABNORMAL HIGH (ref 70–99)
Potassium: 4.4 mmol/L (ref 3.5–5.1)
Sodium: 138 mmol/L (ref 135–145)

## 2020-12-08 LAB — HEMOGLOBIN A1C: Hgb A1c MFr Bld: 6.1 % (ref 4.6–6.5)

## 2020-12-08 NOTE — Assessment & Plan Note (Signed)
Stable, following with psychiatry and therapy. Continue lexapro 20 mg.

## 2020-12-08 NOTE — Patient Instructions (Signed)
Medication Instructions:  Your physician recommends that you continue on your current medications as directed. Please refer to the Current Medication list given to you today.  *If you need a refill on your cardiac medications before your next appointment, please call your pharmacy*   Lab Work:  CBC, BMET at the [facility]  -  Please go to the [facility].  You will check in at the front desk to the right as you walk into the atrium.    Testing/Procedures: Testing/Procedures:  TEE  Date Tuesday 12/13/20   Please arrive at the [place] desk at [facility] at 6:30 AM  Location: [facility]  DIET: Nothing to eat or drink after midnight except a sip of water with medications (see medication instructions below)   Medication Instructions: Hold (diuretics, 24 hours meds, diabetic meds)   Continue your anticoagulant:  You will need to continue your anticoagulant after your procedure until you are told by your Provider that it is safe to stop  You must have a responsible person to drive you home and stay in the waiting area during your procedure. Failure to do so could result in cancellation.   Bring your insurance cards.   *Special Note: Every effort is made to have your procedure done on time. Occasionally there are emergencies that occur at the hospital that may cause delays. Please be patient if a delay does occur.    TEE Your physician has requested that you have a TEE. During a TEE, sound waves are used to create images of your heart. It provides your doctor with information about the size and shape of your heart and how well your heart's chambers and valves are working. In this test, a transducer is attached to the end of a flexible tube that is guided down you throat and into your esophagus (the tube leading from your mouth to your stomach) to get a more detailed image of your heart. This procedure is done at the hospital and you are not awake during  the procedure. You usually go home the day of the procedure. Please see the instruction sheet given to you today for more information.    Follow-Up: At [facility], you and your health needs are our priority.  As part of our continuing mission to provide you with exceptional heart care, we have created designated Provider Care Teams.  These Care Teams include your primary Cardiologist (physician) and Advanced Practice Providers (APPs -  Physician Assistants and Nurse Practitioners) who all work together to provide you with the care you need, when you need it.  We recommend signing up for the patient portal called "MyChart".  Sign up information is provided on this After Visit Summary.  MyChart is used to connect with patients for Virtual Visits (Telemedicine).  Patients are able to view lab/test results, encounter notes, upcoming appointments, etc.  Non-urgent messages can be sent to your provider as well.   To learn more about what you can do with MyChart, go to [email or web address].     Other Instructions  Transesophageal Echocardiogram Transesophageal echocardiogram (TEE) is a test that uses sound waves to take pictures of your heart. TEE is done by passing a small probe attached to a flexible tube down the part of the body that moves food from your mouth to your stomach (esophagus). The pictures give clear images of your heart. This can help your doctor seeif there are problems with your heart. Tell a doctor about: Any allergies  you have. All medicines you are taking. This includes vitamins, herbs, eye drops, creams, and over-the-counter medicines. Any problems you or family members have had with anesthetic medicines. Any blood disorders you have. Any surgeries you have had. Any medical conditions you have. Any swallowing problems. Whether you have or have had a blockage in the part of the body that moves food from your mouth to your stomach. Whether you are pregnant or may be  pregnant. What are the risks? In general, this is a safe procedure. But, problems may occur, such as: Damage to nearby structures or organs. A tear in the part of the body that moves food from your mouth to your stomach. Irregular heartbeat. Hoarse voice or trouble swallowing. Bleeding. What happens before the procedure? Medicines Ask your doctor about changing or stopping: Your normal medicines. Vitamins, herbs, and supplements. Over-the-counter medicines. Do not take aspirin or ibuprofen unless you are told to. General instructions Follow instructions from your doctor about what you cannot eat or drink. You will take out any dentures or dental retainers. Plan to have a responsible adult take you home from the hospital or clinic. Plan to have a responsible adult care for you for the time you are told after you leave the hospital or clinic. This is important. What happens during the procedure?  An IV will be put into one of your veins. You may be given: A sedative. This medicine helps you relax. A medicine to numb the back of your throat. This may be sprayed or gargled. Your blood pressure, heart rate, and breathing will be watched. You may be asked to lie on your left side. A bite block will be placed in your mouth. This keeps you from biting the tube. The tip of the probe will be placed into the back of your mouth. You will be asked to swallow. Your doctor will take pictures of your heart. The probe and bite block will be taken out after the test is done. The procedure may vary among doctors and hospitals. What can I expect after the procedure? You will be monitored until you leave the hospital or clinic. This includes checking your blood pressure, heart rate, breathing rate, and blood oxygen level. Your throat may feel sore and numb. This will get better over time. You will not be allowed to eat or drink until the numbness has gone away. It is common to have a sore throat for  a day or two. It is up to you to get the results of your procedure. Ask how to get your results when they are ready. Follow these instructions at home: If you were given a sedative during your procedure, do not drive or use machines until your doctor says that it is safe. Return to your normal activities when your doctor says that it is safe. Keep all follow-up visits. Summary TEE is a test that uses sound waves to take pictures of your heart. You will be given a medicine to help you relax. Do not drive or use machines until your doctor says that it is safe. This information is not intended to replace advice given to you by your health care provider. Make sure you discuss any questions you have with your healthcare provider. Document Revised: 01/19/2020 Document Reviewed: 01/19/2020 Elsevier Patient Education  2022 [employer].

## 2020-12-08 NOTE — Progress Notes (Signed)
Subjective:    Patient ID: Autumn [name], female    DOB: Dec 16, 1985, 35 y.o.   MRN: [id number]  HPI  Autumn [name] is a very pleasant 35 y.o. female who presents today for complete physical.  Immunizations: -Tetanus: 2020 -Influenza: Due this season  -Covid-19: 2 vaccines   Diet: Fair diet.  Exercise: No regular exercise.  Eye exam: Completes annually  Dental exam: Completes annually   Pap Smear: Completed in 2020  BP Readings from Last 3 Encounters:  12/08/20 118/76  12/06/20 118/80  11/17/20 114/78       Review of Systems  Constitutional:  Negative for unexpected weight change.  HENT:  Negative for rhinorrhea.   Respiratory:  Negative for shortness of breath.   Cardiovascular:  Negative for chest pain.  Gastrointestinal:  Negative for constipation and diarrhea.  Genitourinary:  Negative for difficulty urinating and menstrual problem.  Musculoskeletal:  Negative for arthralgias.  Skin:  Negative for rash.  Allergic/Immunologic: Negative for environmental allergies.  Neurological:  Positive for dizziness. Negative for headaches.       Imbalance - following with neurology   Psychiatric/Behavioral:  The patient is not nervous/anxious.         Past Medical History:  Diagnosis Date   Anxiety    Back pain    chronic   Bilateral pulmonary embolism ([place])    a. 06/2020 following COVID infection.   COVID-19 virus infection 06/2020   Depression    Disc disease, degenerative, lumbar or lumbosacral    History of echocardiogram    a. 06/2020 Echo: EF 55-60%, no rwma, nl RV size/fxn.   History of ovarian cyst    Migraine    Palpitations    a. 2/022 Zio: RSR, 85 (47-112). Rare PACs/PVCs. No significant arrhythmias/prolonged pauses.   Prediabetes    Scoliosis    Shingles 06/20/2018    Social History   Socioeconomic History   Marital status: Married    Spouse name: Not on file   Number of children: 1   Years of education: Not on file   Highest  education level: Not on file  Occupational History   Occupation: Transport planner  Tobacco Use   Smoking status: Former    Years: 10.00    Pack years: 0.00    Types: Cigarettes    Quit date: 08/18/2016    Years since quitting: 4.3   Smokeless tobacco: Never   Tobacco comments:    3 cigarettes a day  Vaping Use   Vaping Use: Never used  Substance and Sexual Activity   Alcohol use: Yes    Alcohol/week: 0.0 standard drinks    Comment: occasional   Drug use: No   Sexual activity: Yes    Birth control/protection: Pill  Other Topics Concern   Not on file  Social History Narrative   Right handed   Social Determinants of Health   Financial Resource Strain: Not on file  Food Insecurity: Not on file  Transportation Needs: Not on file  Physical Activity: Not on file  Stress: Not on file  Social Connections: Not on file  Intimate Partner Violence: Not on file    Past Surgical History:  Procedure Laterality Date   APPENDECTOMY     BREAST SURGERY Right 2008   lump/ benign    Family History  Problem Relation Age of Onset   Cancer Mother 37       breast   Breast cancer Mother 6       and  83   Alcohol abuse Father    Vision loss Maternal Grandmother    Heart failure Maternal Grandmother    Cancer Maternal Grandfather    Alcohol abuse Paternal Grandfather    Arthritis Maternal Aunt    Anxiety disorder Maternal Aunt    Depression Maternal Aunt     Allergies  Allergen Reactions   Imitrex [Sumatriptan] Other (See Comments)    Vomiting, Slurred Speech and Facial Numbness    Current Outpatient Medications on File Prior to Visit  Medication Sig Dispense Refill   amoxicillin-clavulanate (AUGMENTIN) 875-125 MG tablet Take 1 tablet by mouth 2 (two) times daily. 14 tablet 0   apixaban (ELIQUIS) 5 MG TABS tablet TAKE 1 TABLET (5 MG TOTAL) BY MOUTH 2 (TWO) TIMES DAILY. 60 tablet 1   chlorpheniramine-HYDROcodone (TUSSIONEX PENNKINETIC ER) 10-8 MG/5ML SUER Take 5 mLs by mouth every  12 (twelve) hours as needed for cough. 50 mL 0   escitalopram (LEXAPRO) 20 MG tablet Take 1 tablet (20 mg total) by mouth daily. 90 tablet 0   predniSONE (DELTASONE) 10 MG tablet Take 4 pills once daily by mouth for 3 days, then 3 pills daily for 3 days, then 2 pills daily for 3 days then 1 pill daily for 3 days then stop 30 tablet 0   No current facility-administered medications on file prior to visit.    BP 118/76   Pulse 78   Temp 97.8 F (36.6 C) (Temporal)   Ht 5\' 10"  (1.778 m)   Wt 249 lb (112.9 kg)   LMP 11/14/2020   SpO2 97%   BMI 35.73 kg/m  Objective:   Physical Exam HENT:     Right Ear: Tympanic membrane and ear canal normal.     Left Ear: Tympanic membrane and ear canal normal.     Nose: Nose normal.  Eyes:     Conjunctiva/sclera: Conjunctivae normal.     Pupils: Pupils are equal, round, and reactive to light.  Neck:     Thyroid: No thyromegaly.  Cardiovascular:     Rate and Rhythm: Normal rate and regular rhythm.     Heart sounds: No murmur heard. Pulmonary:     Effort: Pulmonary effort is normal.     Breath sounds: Normal breath sounds. No rales.  Abdominal:     General: Bowel sounds are normal.     Palpations: Abdomen is soft.     Tenderness: There is no abdominal tenderness.  Musculoskeletal:        General: Normal range of motion.     Cervical back: Neck supple.  Lymphadenopathy:     Cervical: No cervical adenopathy.  Skin:    General: Skin is warm and dry.     Findings: No rash.  Neurological:     Mental Status: She is alert and oriented to person, place, and time.     Cranial Nerves: No cranial nerve deficit.     Deep Tendon Reflexes: Reflexes are normal and symmetric.  Psychiatric:        Mood and Affect: Mood normal.          Assessment & Plan:      This visit occurred during the SARS-CoV-2 public health emergency.  Safety protocols were in place, including screening questions prior to the visit, additional usage of staff PPE, and  extensive cleaning of exam room while observing appropriate contact time as indicated for disinfecting solutions.

## 2020-12-08 NOTE — Assessment & Plan Note (Signed)
Recent finding on MRI brain. Following with neurology, is now pending CTA of head and neck.  Continue Eliquis 5 mg BID for now.

## 2020-12-08 NOTE — Assessment & Plan Note (Signed)
Doing well on Lexapro 20 mg. Following with therapy and psychiatry.

## 2020-12-08 NOTE — Assessment & Plan Note (Addendum)
No concerns, infrequent overall. Allergy to Imitrex.   Tylenol is effective for mild migraines.

## 2020-12-08 NOTE — Progress Notes (Signed)
Follow-up Outpatient Visit Date: 12/08/2020  Primary Care Provider: Pleas [name], Autumn [name]  Chief Complaint: Stroke  HPI:  [name] is a 36 y.o. female with history of bilateral pulmonary emboli in the setting of COVID-19 (06/2020), prediabetes, and obesity, who presents for evaluation of recently identified cerebellar strokes.  [name] was last seen in our office in March by Autumn [name], Autumn, at which time she reported rare palpitations, improved from prior visits.  Preceding event monitor showed no significant arrhythmia.  She recently complained to her PCP and hematologist about balance issues and was subsequently evaluated by neurology.  Brain MRI revealed tiny remote lacunar infarcts in both cerebellar hemispheres in the cerebellar vermis.  [name] was advised to undergo transesophageal echocardiography for further out evaluation before apixaban is scheduled to Autumn [name] in a couple of weeks (completion of 6 months of anticoagulation following PE).  Today, [name] feels relatively well though she still has some balance issues.  She notes that she unintentionally favors her right side.  From a heart standpoint, she has not had any symptoms, denying chest pain, shortness of breath, and palpitations.  She reports "dizzy spells" all her life, though they seem to be different since she had COVID-19.  She has also had occasional discomfort radiating from the back of her head to the top of her scalp.  She was recently started on Autumn [name] for a possible sinus infection.  --------------------------------------------------------------------------------------------------  Past Medical History:  Diagnosis Date   Anxiety    Back pain    chronic   Bilateral pulmonary embolism ([place])    a. 06/2020 following COVID infection.   COVID-19 virus infection 06/2020   Depression    Disc disease, degenerative, lumbar or lumbosacral    History of  echocardiogram    a. 06/2020 Echo: EF 55-60%, no rwma, nl RV size/fxn.   History of ovarian cyst    Migraine    Palpitations    a. 2/022 Zio: RSR, 85 (47-112). Rare PACs/PVCs. No significant arrhythmias/prolonged pauses.   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   apixaban (ELIQUIS) 5 MG TABS tablet TAKE 1 TABLET (5 MG TOTAL) BY MOUTH 2 (TWO) TIMES DAILY.   chlorpheniramine-HYDROcodone (TUSSIONEX PENNKINETIC ER) 10-8 MG/5ML SUER Take 5 mLs by mouth every 12 (twelve) hours as needed for cough.   escitalopram (LEXAPRO) 20 MG tablet Take 1 tablet (20 mg total) by mouth daily.   [name] (DELTASONE) 10 MG tablet Take 4 pills once daily by mouth for 3 days, then 3 pills daily for 3 days, then 2 pills daily for 3 days then 1 pill daily for 3 days then stop    Allergies: Imitrex [sumatriptan]  Social History   Tobacco Use   Smoking status: Former    Years: 10.00    Pack years: 0.00    Types: Cigarettes    Quit date: 08/18/2016    Years since quitting: 4.3   Smokeless tobacco: Never   Tobacco comments:    3 cigarettes a day  Vaping Use   Vaping  Use: Never used  Substance Use Topics   Alcohol use: Yes    Alcohol/week: 0.0 standard drinks    Comment: occasional   Drug use: No    Family History  Problem Relation Age of Onset   Cancer Mother 35       breast   Breast cancer Mother 53       and 48   Alcohol abuse Father    Vision loss Maternal Grandmother    Heart failure Maternal Grandmother    Cancer Maternal Grandfather    Alcohol abuse Paternal Grandfather    Arthritis Maternal Aunt    Anxiety disorder Maternal Aunt    Depression Maternal  Aunt     Review of Systems: A 12-system review of systems was performed and was negative except as noted in the HPI.  --------------------------------------------------------------------------------------------------  Physical Exam: BP 110/84 (BP Location: Left Arm, Patient Position: Sitting, Cuff Size: Large)   Pulse 78   Ht 5\' 10"  (1.778 m)   Wt 249 lb (112.9 kg)   LMP 11/14/2020   SpO2 98%   BMI 35.73 kg/m   General:  NAD. Neck: No JVD or HJR. Lungs: Clear to auscultation bilaterally without wheezes or crackles. Heart: Regular rate and rhythm without murmurs, rubs, or gallops. Abdomen: Soft, nontender, nondistended. Extremities: No lower extremity edema.  EKG: Normal sinus rhythm with borderline LVH.  No significant abnormality or change from prior tracing on 07/08/2020.  Lab Results  Component Value Date   WBC 11.3 (H) 12/08/2020   HGB 13.9 12/08/2020   HCT 41.2 12/08/2020   MCV 92.6 12/08/2020   PLT 317 12/08/2020    Lab Results  Component Value Date   NA 138 12/08/2020   K 4.4 12/08/2020   CL 104 12/08/2020   CO2 24 12/08/2020   BUN 15 12/08/2020   CREATININE 0.67 12/08/2020   GLUCOSE 113 (H) 12/08/2020   ALT 63 (H) 10/03/2020    Lab Results  Component Value Date   CHOL 146 12/08/2020   HDL 50.80 12/08/2020   LDLCALC 77 12/08/2020   TRIG 94.0 12/08/2020   CHOLHDL 3 12/08/2020    --------------------------------------------------------------------------------------------------  ASSESSMENT AND PLAN: Cryptogenic stroke: [name] reports balance problems over the last few months and was recently found to have bilateral lacunar an embolic event.  In the setting of her COVID-19 provoked PE earlier this year, right to left shunting is a concern.  Previous event monitor in 07/2020 did not show evidence of atrial fibrillation or atrial flutter.  TEE has been recommended by Dr. Tomi [name], which I agree with.  We will arrange for TEE with anesthesia next Tuesday  (12/13/2020).  In the meantime, [name] should continue apixaban.  If TEE is unrevealing, we will await further recommendations for long-term anticoagulation by hematology after completion of hypercoagulable work-up.  If no hypercoagulable state is identified, we may need to readdress loop recorder placement to exclude PAF.  If she has a hypercoagulable state and will remain on indefinite anticoagulation, I would not pursue long-term monitoring as it would not change her management.  History of pulmonary embolism: No evidence of heart failure by symptoms or exam today.  Continue apixaban with ongoing follow-up per hematology.  Shared Decision Making/Informed Consent The risks [esophageal damage, perforation (1:10,000 risk), bleeding, pharyngeal hematoma as well as other potential complications associated with conscious sedation including aspiration, arrhythmia, respiratory failure and death], benefits (treatment guidance and diagnostic support) and alternatives of a transesophageal echocardiogram were discussed in detail with Ms.  Autumn [name] and she is willing to proceed.   Follow-up: To be determined based on results of TEE.  Nelva Bush, MD 12/09/2020 6:56 AM

## 2020-12-08 NOTE — Assessment & Plan Note (Signed)
Discussed the importance of a healthy diet and regular exercise in order for weight loss, and to reduce the risk of further co-morbidity. ? ?Repeat A1C pending. ?

## 2020-12-08 NOTE — Patient Instructions (Signed)
Stop by the lab prior to leaving today. I will notify you of your results once received.   It was a pleasure to see you today!  Preventive Care 35-35 Years Old, Female Preventive care refers to lifestyle choices and visits with your health care provider that can promote health and wellness. This includes: A yearly physical exam. This is also called an annual wellness visit. Regular dental and eye exams. Immunizations. Screening for certain conditions. Healthy lifestyle choices, such as: Eating a healthy diet. Getting regular exercise. Not using drugs or products that contain nicotine and tobacco. Limiting alcohol use. What can I expect for my preventive care visit? Physical exam Your health care provider may check your: Height and weight. These may be used to calculate your BMI (body mass index). BMI is a measurement that tells if you are at a healthy weight. Heart rate and blood pressure. Body temperature. Skin for abnormal spots. Counseling Your health care provider may ask you questions about your: Past medical problems. Family's medical history. Alcohol, tobacco, and drug use. Emotional well-being. Home life and relationship well-being. Sexual activity. Diet, exercise, and sleep habits. Work and work Statistician. Access to firearms. Method of birth control. Menstrual cycle. Pregnancy history. What immunizations do I need?  Vaccines are usually given at various ages, according to a schedule. Your health care provider will recommend vaccines for you based on your age, medicalhistory, and lifestyle or other factors, such as travel or where you work. What tests do I need?  Blood tests Lipid and cholesterol levels. These may be checked every 5 years starting at age 80. Hepatitis C test. Hepatitis B test. Screening Diabetes screening. This is done by checking your blood sugar (glucose) after you have not eaten for a while (fasting). STD (sexually transmitted disease)  testing, if you are at risk. BRCA-related cancer screening. This may be done if you have a family history of breast, ovarian, tubal, or peritoneal cancers. Pelvic exam and Pap test. This may be done every 3 years starting at age 30. Starting at age 37, this may be done every 5 years if you have a Pap test in combination with an HPV test. Talk with your health care provider about your test results, treatment options,and if necessary, the need for more tests. Follow these instructions at home: Eating and drinking  Eat a healthy diet that includes fresh fruits and vegetables, whole grains, lean protein, and low-fat dairy products. Take vitamin and mineral supplements as recommended by your health care provider. Do not drink alcohol if: Your health care provider tells you not to drink. You are pregnant, may be pregnant, or are planning to become pregnant. If you drink alcohol: Limit how much you have to 0-1 drink a day. Be aware of how much alcohol is in your drink. In the [place] one drink equals one 12 oz bottle of beer (355 mL), one 5 oz glass of wine (148 mL), or one 1 oz glass of hard liquor (44 mL).  Lifestyle Take daily care of your teeth and gums. Brush your teeth every morning and night with fluoride toothpaste. Floss one time each day. Stay active. Exercise for at least 30 minutes 5 or more days each week. Do not use any products that contain nicotine or tobacco, such as cigarettes, e-cigarettes, and chewing tobacco. If you need help quitting, ask your health care provider. Do not use drugs. If you are sexually active, practice safe sex. Use a condom or other form of protection to prevent  STIs (sexually transmitted infections). If you do not wish to become pregnant, use a form of birth control. If you plan to become pregnant, see your health care provider for a prepregnancy visit. Find healthy ways to cope with stress, such as: Meditation, yoga, or listening to  music. Journaling. Talking to a trusted person. Spending time with friends and family. Safety Always wear your seat belt while driving or riding in a vehicle. Do not drive: If you have been drinking alcohol. Do not ride with someone who has been drinking. When you are tired or distracted. While texting. Wear a helmet and other protective equipment during sports activities. If you have firearms in your house, make sure you follow all gun safety procedures. Seek help if you have been physically or sexually abused. What's next? Go to your health care provider once a year for an annual wellness visit. Ask your health care provider how often you should have your eyes and teeth checked. Stay up to date on all vaccines. This information is not intended to replace advice given to you by your health care provider. Make sure you discuss any questions you have with your healthcare provider. Document Revised: 01/24/2020 Document Reviewed: 02/06/2018 Elsevier Patient Education  2022 [employer].

## 2020-12-08 NOTE — Addendum Note (Signed)
Addended by: Ellamae Sia on: 12/08/2020 09:45 AM   Modules accepted: Orders

## 2020-12-08 NOTE — Assessment & Plan Note (Signed)
Compliant to Eliquis 5 mg BID, continue same for now. May come off in mid July if stroke and cardiac testing is clear.

## 2020-12-08 NOTE — Assessment & Plan Note (Signed)
Immunizations UTD. Pap smear UTD. Mammogram completed this year.  Discussed the importance of a healthy diet and regular exercise in order for weight loss, and to reduce the risk of further co-morbidity.  Exam today stable. Labs reviewed and pending.

## 2020-12-08 NOTE — H&P (View-Only) (Signed)
Follow-up Outpatient Visit Date: 12/08/2020  Primary Care Provider: [place] [id number]  Chief Complaint: Stroke  HPI:  Autumn [name] is a 35 y.o. female with history of bilateral pulmonary emboli in the setting of COVID-19 (06/2020), prediabetes, and obesity, who presents for evaluation of recently identified cerebellar strokes.  Ms. Umali was last seen in our office in March by Tarri Glenn, NP, at which time she reported rare palpitations, improved from prior visits.  Preceding event monitor showed no significant arrhythmia.  She recently complained to her PCP and hematologist about balance issues and was subsequently evaluated by neurology.  Brain MRI revealed tiny remote lacunar infarcts in both cerebellar hemispheres in the cerebellar vermis.  Autumn [name] was advised to undergo transesophageal echocardiography for further out evaluation before apixaban is scheduled to Lillyan Hitson in a couple of weeks (completion of 6 months of anticoagulation following PE).  Today, Autumn [name] feels relatively well though she still has some balance issues.  She notes that she unintentionally favors her right side.  From a heart standpoint, she has not had any symptoms, denying chest pain, shortness of breath, and palpitations.  She reports "dizzy spells" all her life, though they seem to be different since she had COVID-19.  She has also had occasional discomfort radiating from the back of her head to the top of her scalp.  She was recently started on Augmentin and prednisone for a possible sinus infection.  --------------------------------------------------------------------------------------------------  Past Medical History:  Diagnosis Date   Anxiety    Back pain    chronic   Bilateral pulmonary embolism ([place])    a. 06/2020 following COVID infection.   COVID-19 virus infection 06/2020   Depression    Disc disease, degenerative, lumbar or lumbosacral    History of  echocardiogram    a. 06/2020 Echo: EF 55-60%, no rwma, nl RV size/fxn.   History of ovarian cyst    Migraine    Palpitations    a. 2/022 Zio: RSR, 85 (47-112). Rare PACs/PVCs. No significant arrhythmias/prolonged pauses.   Prediabetes    Scoliosis    Shingles 06/20/2018   Stroke ([place]) 2022   Remote lacunar strokes noted in the cerebellum by MRI   Past Surgical History:  Procedure Laterality Date   APPENDECTOMY     BREAST SURGERY Right 2008   lump/ benign    Current Meds  Medication Sig   amoxicillin-clavulanate (AUGMENTIN) 875-125 MG tablet Take 1 tablet by mouth 2 (two) times daily.   apixaban (ELIQUIS) 5 MG TABS tablet TAKE 1 TABLET (5 MG TOTAL) BY MOUTH 2 (TWO) TIMES DAILY.   chlorpheniramine-HYDROcodone (TUSSIONEX PENNKINETIC ER) 10-8 MG/5ML SUER Take 5 mLs by mouth every 12 (twelve) hours as needed for cough.   escitalopram (LEXAPRO) 20 MG tablet Take 1 tablet (20 mg total) by mouth daily.   predniSONE (DELTASONE) 10 MG tablet Take 4 pills once daily by mouth for 3 days, then 3 pills daily for 3 days, then 2 pills daily for 3 days then 1 pill daily for 3 days then stop    Allergies: Imitrex [sumatriptan]  Social History   Tobacco Use   Smoking status: Former    Years: 10.00    Pack years: 0.00    Types: Cigarettes    Quit date: 08/18/2016    Years since quitting: 4.3   Smokeless tobacco: Never   Tobacco comments:    3 cigarettes a day  Vaping Use   Vaping  Use: Never used  Substance Use Topics   Alcohol use: Yes    Alcohol/week: 0.0 standard drinks    Comment: occasional   Drug use: No    Family History  Problem Relation Age of Onset   Cancer Mother 59       breast   Breast cancer Mother 60       and 38   Alcohol abuse Father    Vision loss Maternal Grandmother    Heart failure Maternal Grandmother    Cancer Maternal Grandfather    Alcohol abuse Paternal Grandfather    Arthritis Maternal Aunt    Anxiety disorder Maternal Aunt    Depression Maternal  Aunt     Review of Systems: A 12-system review of systems was performed and was negative except as noted in the HPI.  --------------------------------------------------------------------------------------------------  Physical Exam: BP 110/84 (BP Location: Left Arm, Patient Position: Sitting, Cuff Size: Large)   Pulse 78   Ht 5\' 10"  (1.778 m)   Wt 249 lb (112.9 kg)   LMP 11/14/2020   SpO2 98%   BMI 35.73 kg/m   General:  NAD. Neck: No JVD or HJR. Lungs: Clear to auscultation bilaterally without wheezes or crackles. Heart: Regular rate and rhythm without murmurs, rubs, or gallops. Abdomen: Soft, nontender, nondistended. Extremities: No lower extremity edema.  EKG: Normal sinus rhythm with borderline LVH.  No significant abnormality or change from prior tracing on 07/08/2020.  Lab Results  Component Value Date   WBC 11.3 (H) 12/08/2020   HGB 13.9 12/08/2020   HCT 41.2 12/08/2020   MCV 92.6 12/08/2020   PLT 317 12/08/2020    Lab Results  Component Value Date   NA 138 12/08/2020   K 4.4 12/08/2020   CL 104 12/08/2020   CO2 24 12/08/2020   BUN 15 12/08/2020   CREATININE 0.67 12/08/2020   GLUCOSE 113 (H) 12/08/2020   ALT 63 (H) 10/03/2020    Lab Results  Component Value Date   CHOL 146 12/08/2020   HDL 50.80 12/08/2020   LDLCALC 77 12/08/2020   TRIG 94.0 12/08/2020   CHOLHDL 3 12/08/2020    --------------------------------------------------------------------------------------------------  ASSESSMENT AND PLAN: Cryptogenic stroke: Autumn [name] reports balance problems over the last few months and was recently found to have bilateral lacunar an embolic event.  In the setting of her COVID-19 provoked PE earlier this year, right to left shunting is a concern.  Previous event monitor in 07/2020 did not show evidence of atrial fibrillation or atrial flutter.  TEE has been recommended by Dr. Tomi Likens, which I agree with.  We will arrange for TEE with anesthesia next Tuesday  (12/13/2020).  In the meantime, Autumn [name] should continue apixaban.  If TEE is unrevealing, we will await further recommendations for long-term anticoagulation by hematology after completion of hypercoagulable work-up.  If no hypercoagulable state is identified, we may need to readdress loop recorder placement to exclude PAF.  If she has a hypercoagulable state and will remain on indefinite anticoagulation, I would not pursue long-term monitoring as it would not change her management.  History of pulmonary embolism: No evidence of heart failure by symptoms or exam today.  Continue apixaban with ongoing follow-up per hematology.  Shared Decision Making/Informed Consent The risks [esophageal damage, perforation (1:10,000 risk), bleeding, pharyngeal hematoma as well as other potential complications associated with conscious sedation including aspiration, arrhythmia, respiratory failure and death], benefits (treatment guidance and diagnostic support) and alternatives of a transesophageal echocardiogram were discussed in detail with Ms.  Georgiades and she is willing to proceed.   Follow-up: To be determined based on results of TEE.  Nelva Bush, MD 12/09/2020 6:56 AM

## 2020-12-09 ENCOUNTER — Ambulatory Visit (INDEPENDENT_AMBULATORY_CARE_PROVIDER_SITE_OTHER): Payer: 59 | Admitting: Psychology

## 2020-12-09 ENCOUNTER — Encounter: Payer: Self-pay | Admitting: Internal Medicine

## 2020-12-09 DIAGNOSIS — F411 Generalized anxiety disorder: Secondary | ICD-10-CM | POA: Diagnosis not present

## 2020-12-09 DIAGNOSIS — Z8673 Personal history of transient ischemic attack (TIA), and cerebral infarction without residual deficits: Secondary | ICD-10-CM | POA: Insufficient documentation

## 2020-12-09 DIAGNOSIS — Z86711 Personal history of pulmonary embolism: Secondary | ICD-10-CM | POA: Insufficient documentation

## 2020-12-09 LAB — HEPATITIS C ANTIBODY
Hepatitis C Ab: NONREACTIVE
SIGNAL TO CUT-OFF: 0.01 (ref <1.00)

## 2020-12-12 MED ORDER — SODIUM CHLORIDE 0.9 % IV SOLN: INTRAVENOUS | Status: DC

## 2020-12-13 ENCOUNTER — Ambulatory Visit (HOSPITAL_BASED_OUTPATIENT_CLINIC_OR_DEPARTMENT_OTHER)
Admission: RE | Admit: 2020-12-13 | Discharge: 2020-12-13 | Disposition: A | Discharge status: Home / Self Care | Payer: 59 | Source: Home / Self Care | Attending: Internal Medicine | Admitting: Internal Medicine

## 2020-12-13 ENCOUNTER — Ambulatory Visit
Admission: RE | Admit: 2020-12-13 | Discharge: 2020-12-13 | Disposition: A | Discharge status: Home / Self Care | Payer: 59 | Attending: Internal Medicine | Admitting: Internal Medicine

## 2020-12-13 ENCOUNTER — Ambulatory Visit: Payer: 59 | Admitting: Anesthesiology

## 2020-12-13 ENCOUNTER — Other Ambulatory Visit: Payer: Self-pay

## 2020-12-13 ENCOUNTER — Encounter
Admission: RE | Disposition: A | Discharge status: Home / Self Care | Payer: Self-pay | Source: Home / Self Care | Attending: Internal Medicine

## 2020-12-13 ENCOUNTER — Encounter: Payer: Self-pay | Admitting: Internal Medicine

## 2020-12-13 DIAGNOSIS — Z6835 Body mass index (BMI) 35.0-35.9, adult: Secondary | ICD-10-CM | POA: Insufficient documentation

## 2020-12-13 DIAGNOSIS — I361 Nonrheumatic tricuspid (valve) insufficiency: Secondary | ICD-10-CM | POA: Diagnosis not present

## 2020-12-13 DIAGNOSIS — Z86711 Personal history of pulmonary embolism: Secondary | ICD-10-CM | POA: Insufficient documentation

## 2020-12-13 DIAGNOSIS — I639 Cerebral infarction, unspecified: Secondary | ICD-10-CM | POA: Diagnosis not present

## 2020-12-13 DIAGNOSIS — I2699 Other pulmonary embolism without acute cor pulmonale: Secondary | ICD-10-CM

## 2020-12-13 DIAGNOSIS — Z79899 Other long term (current) drug therapy: Secondary | ICD-10-CM | POA: Insufficient documentation

## 2020-12-13 DIAGNOSIS — E669 Obesity, unspecified: Secondary | ICD-10-CM | POA: Diagnosis not present

## 2020-12-13 DIAGNOSIS — Z8616 Personal history of COVID-19: Secondary | ICD-10-CM | POA: Insufficient documentation

## 2020-12-13 DIAGNOSIS — R7303 Prediabetes: Secondary | ICD-10-CM | POA: Insufficient documentation

## 2020-12-13 DIAGNOSIS — Z87891 Personal history of nicotine dependence: Secondary | ICD-10-CM | POA: Insufficient documentation

## 2020-12-13 DIAGNOSIS — F418 Other specified anxiety disorders: Secondary | ICD-10-CM | POA: Diagnosis not present

## 2020-12-13 DIAGNOSIS — Z7901 Long term (current) use of anticoagulants: Secondary | ICD-10-CM | POA: Diagnosis not present

## 2020-12-13 HISTORY — PX: TEE WITHOUT CARDIOVERSION: SHX5443

## 2020-12-13 SURGERY — ECHOCARDIOGRAM, TRANSESOPHAGEAL
Anesthesia: General

## 2020-12-13 MED ORDER — LIDOCAINE VISCOUS HCL 2 % MT SOLN
OROMUCOSAL | Status: AC
  Administered 2020-12-13: 15 mL
  Filled 2020-12-13: qty 15

## 2020-12-13 MED ORDER — PROPOFOL 10 MG/ML IV BOLUS
INTRAVENOUS | Status: AC
  Filled 2020-12-13: qty 40

## 2020-12-13 MED ORDER — PROPOFOL 10 MG/ML IV BOLUS
INTRAVENOUS | Status: DC | PRN
  Administered 2020-12-13: 50 mg via INTRAVENOUS
  Administered 2020-12-13: 30 mg via INTRAVENOUS
  Administered 2020-12-13: 90 mg via INTRAVENOUS

## 2020-12-13 MED ORDER — SODIUM CHLORIDE FLUSH 0.9 % IV SOLN
INTRAVENOUS | Status: AC
  Filled 2020-12-13: qty 20

## 2020-12-13 NOTE — Transfer of Care (Signed)
Immediate Anesthesia Transfer of Care Note  Patient: Autumn [name]  Procedure(s) Performed: TRANSESOPHAGEAL ECHOCARDIOGRAM (TEE)  Patient Location: PACU and Nursing Unit  Anesthesia Type:MAC  Level of Consciousness: awake  Airway & Oxygen Therapy: Patient Spontanous Breathing and Patient connected to nasal cannula oxygen  Post-op Assessment: Report given to RN and Post -op Vital signs reviewed and stable  Post vital signs: Reviewed and stable  Last Vitals:  Vitals Value Taken Time  BP    Temp    Pulse    Resp    SpO2      Last Pain:  Vitals:   12/13/20 0658  TempSrc: Oral  PainSc: 0-No pain         Complications: No notable events documented.

## 2020-12-13 NOTE — Interval H&P Note (Signed)
History and Physical Interval Note:  12/13/2020 7:23 AM  Autumn [name]  has presented today for surgery, with the diagnosis of cryptogenic stroke.  The various methods of treatment have been discussed with the patient and family. After consideration of risks, benefits and other options for treatment, the patient has consented to  Procedure(s): TRANSESOPHAGEAL ECHOCARDIOGRAM (TEE) (N/A) as a surgical intervention.  The patient's history has been reviewed, patient examined, no change in status, stable for surgery.  I have reviewed the patient's chart and labs.  Questions were answered to the patient's satisfaction.     Gustavo Meditz

## 2020-12-13 NOTE — Anesthesia Preprocedure Evaluation (Signed)
Anesthesia Evaluation  Patient identified by MRN, date of birth, ID band Patient awake    Reviewed: Allergy & Precautions, NPO status , Patient's Chart, lab work & pertinent test results  History of Anesthesia Complications Negative for: history of anesthetic complications  Airway Mallampati: III  TM Distance: <3 FB Neck ROM: full    Dental  (+) Chipped   Pulmonary neg shortness of breath, former smoker,    Pulmonary exam normal        Cardiovascular Exercise Tolerance: Good (-) angina(-) Past MI negative cardio ROS Normal cardiovascular exam     Neuro/Psych  Headaches, PSYCHIATRIC DISORDERS  Neuromuscular disease CVA, Residual Symptoms    GI/Hepatic negative GI ROS, Neg liver ROS, neg GERD  ,  Endo/Other  negative endocrine ROS  Renal/GU negative Renal ROS  negative genitourinary   Musculoskeletal   Abdominal   Peds  Hematology negative hematology ROS (+)   Anesthesia Other Findings Past Medical History: No date: Anxiety No date: Back pain     Comment:  chronic No date: Bilateral pulmonary embolism ([place])     Comment:  a. 06/2020 following COVID infection. 06/2020: COVID-19 virus infection No date: Depression No date: Disc disease, degenerative, lumbar or lumbosacral No date: History of echocardiogram     Comment:  a. 06/2020 Echo: EF 55-60%, no rwma, nl RV size/fxn. No date: History of ovarian cyst No date: Migraine No date: Palpitations     Comment:  a. 2/022 Zio: RSR, 85 (47-112). Rare PACs/PVCs. No               significant arrhythmias/prolonged pauses. No date: Prediabetes No date: Scoliosis 06/20/2018: Shingles 2022: Stroke [facility])     Comment:  Remote lacunar strokes noted in the cerebellum by MRI  Past Surgical History: No date: APPENDECTOMY 2008: BREAST SURGERY; Right     Comment:  lump/ benign  BMI    Body Mass Index: 35.87 kg/m      Reproductive/Obstetrics negative OB ROS                              Anesthesia Physical Anesthesia Plan  ASA: 3  Anesthesia Plan: General   Post-op Pain Management:    Induction: Intravenous  PONV Risk Score and Plan: Propofol infusion and TIVA  Airway Management Planned: Natural Airway and Nasal Cannula  Additional Equipment:   Intra-op Plan:   Post-operative Plan:   Informed Consent: I have reviewed the patients History and Physical, chart, labs and discussed the procedure including the risks, benefits and alternatives for the proposed anesthesia with the patient or authorized representative who has indicated his/her understanding and acceptance.     Dental Advisory Given  Plan Discussed with: Anesthesiologist, CRNA and Surgeon  Anesthesia Plan Comments: (Patient signed pregnancy test op out consent.  Was consented for possible risks to unknown fetus and voiced consent.   Patient consented for risks of anesthesia including but not limited to:  - adverse reactions to medications - risk of airway placement if required - damage to eyes, teeth, lips or other oral mucosa - nerve damage due to positioning  - sore throat or hoarseness - Damage to heart, brain, nerves, lungs, other parts of body or loss of life  Patient voiced understanding.)        Anesthesia Quick Evaluation

## 2020-12-13 NOTE — CV Procedure (Addendum)
    Transesophageal Echocardiogram Note  Autumn [name] [id number] [birth year]  Procedure: Transesophageal Echocardiogram Indications: Cryptogenic stroke  Procedure Details Consent: Obtained Time Out: Verified patient identification, verified procedure, site/side was marked, verified correct patient position, special equipment/implants available, Radiology Safety Procedures followed,  medications/allergies/relevent history reviewed, required imaging and test results available.  Performed  Medications:  The patient received IV propofol by anesthesia during the procedure.  She was continuously monitored by the anesthesia team.  Left Ventrical:  Normal.  Mitral Valve: Normal with trivial regurgitation.  Aortic Valve: Tricuspid.  Trivial regurgitation.  Tricuspid Valve: Normal with mild regurgitation.  Pulmonic Valve: Not well visualized.  Left Atrium/ Left atrial appendage: No thrombus.  Atrial septum: Intact.  Negative bubble study.  Aorta: Normal.  Complications: No apparent complications Patient did tolerate procedure well.  Nelva Bush, MD 12/13/2020, 8:01 AM

## 2020-12-13 NOTE — Progress Notes (Signed)
*  PRELIMINARY RESULTS* Echocardiogram Echocardiogram Transesophageal has been performed.  Sherrie Sport 12/13/2020, 7:59 AM

## 2020-12-14 NOTE — Anesthesia Postprocedure Evaluation (Signed)
Anesthesia Post Note  Patient: Kerra V Dollar  Procedure(s) Performed: TRANSESOPHAGEAL ECHOCARDIOGRAM (TEE)  Patient location during evaluation: Specials Recovery Anesthesia Type: General Level of consciousness: awake and alert Pain management: pain level controlled Vital Signs Assessment: post-procedure vital signs reviewed and stable Respiratory status: spontaneous breathing, nonlabored ventilation, respiratory function stable and patient connected to nasal cannula oxygen Cardiovascular status: blood pressure returned to baseline and stable Postop Assessment: no apparent nausea or vomiting Anesthetic complications: no   No notable events documented.   Last Vitals:  Vitals:   12/13/20 0820 12/13/20 0830  BP: 110/71 111/86  Pulse: 69 72  Resp: 18 13  Temp:    SpO2: 98% 100%    Last Pain:  Vitals:   12/13/20 0830  TempSrc:   PainSc: 0-No pain                 Precious Haws Amelio Brosky

## 2020-12-19 ENCOUNTER — Ambulatory Visit (INDEPENDENT_AMBULATORY_CARE_PROVIDER_SITE_OTHER): Payer: 59 | Admitting: Psychology

## 2020-12-19 DIAGNOSIS — F411 Generalized anxiety disorder: Secondary | ICD-10-CM

## 2020-12-20 ENCOUNTER — Ambulatory Visit (INDEPENDENT_AMBULATORY_CARE_PROVIDER_SITE_OTHER): Payer: 59 | Admitting: Primary Care

## 2020-12-20 ENCOUNTER — Encounter: Payer: Self-pay | Admitting: Primary Care

## 2020-12-20 VITALS — BP 120/62 | HR 78 | Temp 97.6°F | Ht 70.0 in | Wt 250.0 lb

## 2020-12-20 DIAGNOSIS — J029 Acute pharyngitis, unspecified: Secondary | ICD-10-CM | POA: Insufficient documentation

## 2020-12-20 LAB — CBC WITH DIFFERENTIAL/PLATELET
Basophils Absolute: 0.1 10*3/uL (ref 0.0–0.1)
Basophils Relative: 1 % (ref 0.0–3.0)
Eosinophils Absolute: 0.2 10*3/uL (ref 0.0–0.7)
Eosinophils Relative: 2.8 % (ref 0.0–5.0)
HCT: 40.7 % (ref 36.0–46.0)
Hemoglobin: 13.8 g/dL (ref 12.0–15.0)
Lymphocytes Relative: 36.6 % (ref 12.0–46.0)
Lymphs Abs: 3.1 10*3/uL (ref 0.7–4.0)
MCHC: 33.9 g/dL (ref 30.0–36.0)
MCV: 90.2 fl (ref 78.0–100.0)
Monocytes Absolute: 0.6 10*3/uL (ref 0.1–1.0)
Monocytes Relative: 7.5 % (ref 3.0–12.0)
Neutro Abs: 4.4 10*3/uL (ref 1.4–7.7)
Neutrophils Relative %: 52.1 % (ref 43.0–77.0)
Platelets: 304 10*3/uL (ref 150.0–400.0)
RBC: 4.51 Mil/uL (ref 3.87–5.11)
RDW: 13.5 % (ref 11.5–15.5)
WBC: 8.3 10*3/uL (ref 4.0–10.5)

## 2020-12-20 LAB — MONONUCLEOSIS SCREEN: Mono Screen: NEGATIVE

## 2020-12-20 NOTE — Progress Notes (Signed)
Subjective:    Patient ID: Autumn [name], female    DOB: [birth year], 35 y.o.   MRN: [id number]  HPI  Autumn [name] is a very pleasant 35 y.o. female with a history of cryptogenic stroke, pulmonary embolism who presents today to discuss sore throat.   Her symptoms began 12/02/20 with sore throat, tested negative for strep throat that day. She the developed cough, sinus pressure, ear pain, hoarse voice. She was evaluated by Dr. Glori Bickers on 12/06/20 diagnosed with otitis media, provided with Augmentin BID for 7 days, completed prescription on 12/13/20.  Sore throat improved for 24 hours after antibiotic treatment, then returned the following day. Since 12/14/20 has had increased throat pain that is worse with sneezing/coughing, "wants to cry". Pain has persisted, feels swelling to upper anterior neck.   She's tried warm salt water, hot tea with honey, throat lozenges without improvement. Tylenol helps temporarily. She is dating someone who has recurrent sore throat, same symptoms.   She is not taking an antihistamine. Occasional GERD symptoms. She recently changed roles at work, is talking on the phone most of her work day, this began about 3 weeks ago.    Review of Systems  Constitutional:  Negative for fever.  HENT:  Positive for sore throat. Negative for postnasal drip.   Respiratory:  Negative for cough and shortness of breath.         Past Medical History:  Diagnosis Date   Anxiety    Back pain    chronic   Bilateral pulmonary embolism ([place])    a. 06/2020 following COVID infection.   COVID-19 virus infection 06/2020   Depression    Disc disease, degenerative, lumbar or lumbosacral    History of echocardiogram    a. 06/2020 Echo: EF 55-60%, no rwma, nl RV size/fxn.   History of ovarian cyst    Migraine    Palpitations    a. 2/022 [facility]: RSR, 85 (47-112). Rare PACs/PVCs. No significant arrhythmias/prolonged pauses.   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    Social History   Socioeconomic History   Marital status: Married    Spouse name: Not on file   Number of children: 1   Years of education: Not on file   Highest education level: Not on file  Occupational History   Occupation: Transport planner  Tobacco Use   Smoking status: Former    Years: 10.00    Pack years: 0.00    Types: Cigarettes    Quit date: 08/18/2016    Years since quitting: 4.3   Smokeless tobacco: Never   Tobacco comments:    3 cigarettes a day  Vaping Use   Vaping Use: Never used  Substance and Sexual Activity   Alcohol use: Yes    Alcohol/week: 0.0 standard drinks    Comment: occasional   Drug use: No   Sexual activity: Yes    Birth control/protection: Pill  Other Topics Concern   Not on file  Social History Narrative   Right handed   Social Determinants of Health   Financial Resource Strain: Not on file  Food Insecurity: Not on file  Transportation Needs: Not on file  Physical Activity: Not on file  Stress: Not on file  Social Connections: Not on file  Intimate Partner Violence: Not on file    Past Surgical History:  Procedure Laterality Date  APPENDECTOMY     BREAST SURGERY Right 2008   lump/ benign   TEE WITHOUT CARDIOVERSION N/A 12/13/2020   Procedure: TRANSESOPHAGEAL ECHOCARDIOGRAM (TEE);  Surgeon: Nelva Bush, MD;  Location: [facility] ORS;  Service: Cardiovascular;  Laterality: N/A;    Family History  Problem Relation Age of Onset   Cancer Mother 70       breast   Breast cancer Mother 107       and 38   Alcohol abuse Father    Vision loss Maternal Grandmother    Heart failure Maternal Grandmother    Cancer Maternal Grandfather    Alcohol abuse Paternal Grandfather    Arthritis Maternal Aunt    Anxiety disorder Maternal Aunt    Depression Maternal Aunt     Allergies  Allergen Reactions   Imitrex [Sumatriptan] Other (See Comments)    Vomiting, Slurred  Speech and Facial Numbness    Current Outpatient Medications on File Prior to Visit  Medication Sig Dispense Refill   acetaminophen (TYLENOL) 500 MG tablet Take 1,000 mg by mouth every 6 (six) hours as needed (headaches/migraines/pain).     apixaban (ELIQUIS) 5 MG TABS tablet TAKE 1 TABLET (5 MG TOTAL) BY MOUTH 2 (TWO) TIMES DAILY. 60 tablet 1   escitalopram (LEXAPRO) 20 MG tablet Take 1 tablet (20 mg total) by mouth daily. 90 tablet 0   valACYclovir (VALTREX) 1000 MG tablet Take 2 g by mouth 2 (two) times daily as needed (fever blisters).     No current facility-administered medications on file prior to visit.    BP 120/62   Pulse 78   Temp 97.6 F (36.4 C) (Temporal)   Ht 5\' 10"  (1.778 m)   Wt 250 lb (113.4 kg)   SpO2 98%   BMI 35.87 kg/m  Objective:   Physical Exam Constitutional:      Appearance: She is not ill-appearing.  HENT:     Mouth/Throat:     Mouth: Mucous membranes are moist.     Pharynx: No pharyngeal swelling, posterior oropharyngeal erythema or uvula swelling.     Tonsils: No tonsillar exudate. 0 on the right. 0 on the left.  Musculoskeletal:     Cervical back: Neck supple.  Lymphadenopathy:     Cervical: No cervical adenopathy.          Assessment & Plan:      This visit occurred during the SARS-CoV-2 public health emergency.  Safety protocols were in place, including screening questions prior to the visit, additional usage of staff PPE, and extensive cleaning of exam room while observing appropriate contact time as indicated for disinfecting solutions.

## 2020-12-20 NOTE — Assessment & Plan Note (Signed)
Acute for the last 2-3 weeks, negative for strep, improved with Augmentin course.  Checking CBC and monotest.  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  Also consider GERD and post nasal drip.  Checking labs. If negative then consider ENT for evaluation of deeper throat region.   She does not appear acutely ill.

## 2020-12-21 DIAGNOSIS — J029 Acute pharyngitis, unspecified: Secondary | ICD-10-CM

## 2020-12-26 ENCOUNTER — Ambulatory Visit
Admission: RE | Admit: 2020-12-26 | Discharge: 2020-12-26 | Disposition: A | Discharge status: Home / Self Care | Payer: 59 | Source: Ambulatory Visit | Attending: Neurology | Admitting: Neurology

## 2020-12-26 DIAGNOSIS — R519 Headache, unspecified: Secondary | ICD-10-CM

## 2020-12-26 DIAGNOSIS — R270 Ataxia, unspecified: Secondary | ICD-10-CM

## 2020-12-26 DIAGNOSIS — G43701 Chronic migraine without aura, not intractable, with status migrainosus: Secondary | ICD-10-CM

## 2020-12-26 DIAGNOSIS — R590 Localized enlarged lymph nodes: Secondary | ICD-10-CM | POA: Diagnosis not present

## 2020-12-26 DIAGNOSIS — G43709 Chronic migraine without aura, not intractable, without status migrainosus: Secondary | ICD-10-CM | POA: Diagnosis not present

## 2020-12-26 MED ORDER — IOPAMIDOL (ISOVUE-370) INJECTION 76%
75.0000 mL | Freq: Once | INTRAVENOUS | Status: AC | PRN
  Administered 2020-12-26: 75 mL via INTRAVENOUS

## 2020-12-28 ENCOUNTER — Other Ambulatory Visit: Payer: Self-pay | Admitting: Primary Care

## 2020-12-28 DIAGNOSIS — J029 Acute pharyngitis, unspecified: Secondary | ICD-10-CM

## 2020-12-28 DIAGNOSIS — R590 Localized enlarged lymph nodes: Secondary | ICD-10-CM

## 2020-12-29 ENCOUNTER — Other Ambulatory Visit: Payer: Self-pay

## 2020-12-29 ENCOUNTER — Ambulatory Visit (HOSPITAL_COMMUNITY)
Admission: RE | Admit: 2020-12-29 | Discharge: 2020-12-29 | Disposition: A | Discharge status: Home / Self Care | Payer: 59 | Source: Ambulatory Visit | Attending: Primary Care | Admitting: Primary Care

## 2020-12-29 DIAGNOSIS — E010 Iodine-deficiency related diffuse (endemic) goiter: Secondary | ICD-10-CM | POA: Diagnosis not present

## 2020-12-29 DIAGNOSIS — J029 Acute pharyngitis, unspecified: Secondary | ICD-10-CM | POA: Insufficient documentation

## 2020-12-29 DIAGNOSIS — R590 Localized enlarged lymph nodes: Secondary | ICD-10-CM | POA: Diagnosis not present

## 2020-12-30 ENCOUNTER — Other Ambulatory Visit: Payer: Self-pay | Admitting: Primary Care

## 2020-12-30 ENCOUNTER — Inpatient Hospital Stay: Payer: 59 | Attending: Oncology

## 2020-12-30 ENCOUNTER — Other Ambulatory Visit (INDEPENDENT_AMBULATORY_CARE_PROVIDER_SITE_OTHER): Payer: 59

## 2020-12-30 ENCOUNTER — Other Ambulatory Visit: Payer: Self-pay

## 2020-12-30 DIAGNOSIS — Z809 Family history of malignant neoplasm, unspecified: Secondary | ICD-10-CM | POA: Diagnosis not present

## 2020-12-30 DIAGNOSIS — I2699 Other pulmonary embolism without acute cor pulmonale: Secondary | ICD-10-CM

## 2020-12-30 DIAGNOSIS — R2689 Other abnormalities of gait and mobility: Secondary | ICD-10-CM

## 2020-12-30 DIAGNOSIS — M542 Cervicalgia: Secondary | ICD-10-CM | POA: Insufficient documentation

## 2020-12-30 DIAGNOSIS — E010 Iodine-deficiency related diffuse (endemic) goiter: Secondary | ICD-10-CM

## 2020-12-30 DIAGNOSIS — Z8616 Personal history of COVID-19: Secondary | ICD-10-CM | POA: Diagnosis not present

## 2020-12-30 DIAGNOSIS — Z87891 Personal history of nicotine dependence: Secondary | ICD-10-CM | POA: Diagnosis not present

## 2020-12-30 DIAGNOSIS — Z8673 Personal history of transient ischemic attack (TIA), and cerebral infarction without residual deficits: Secondary | ICD-10-CM | POA: Insufficient documentation

## 2020-12-30 DIAGNOSIS — R2681 Unsteadiness on feet: Secondary | ICD-10-CM | POA: Insufficient documentation

## 2020-12-30 DIAGNOSIS — R070 Pain in throat: Secondary | ICD-10-CM | POA: Diagnosis not present

## 2020-12-30 DIAGNOSIS — Z803 Family history of malignant neoplasm of breast: Secondary | ICD-10-CM | POA: Insufficient documentation

## 2020-12-30 DIAGNOSIS — Z7901 Long term (current) use of anticoagulants: Secondary | ICD-10-CM

## 2020-12-30 LAB — COMPREHENSIVE METABOLIC PANEL
ALT: 45 U/L — ABNORMAL HIGH (ref 0–44)
AST: 31 U/L (ref 15–41)
Albumin: 4.2 g/dL (ref 3.5–5.0)
Alkaline Phosphatase: 63 U/L (ref 38–126)
Anion gap: 11 (ref 5–15)
BUN: 11 mg/dL (ref 6–20)
CO2: 22 mmol/L (ref 22–32)
Calcium: 8.9 mg/dL (ref 8.9–10.3)
Chloride: 104 mmol/L (ref 98–111)
Creatinine, Ser: 0.72 mg/dL (ref 0.44–1.00)
GFR, Estimated: >60 mL/min (ref >60)
Glucose, Bld: 98 mg/dL (ref 70–99)
Potassium: 4 mmol/L (ref 3.5–5.1)
Sodium: 137 mmol/L (ref 135–145)
Total Bilirubin: 0.4 mg/dL (ref 0.3–1.2)
Total Protein: 7.5 g/dL (ref 6.5–8.1)

## 2020-12-30 LAB — CBC WITH DIFFERENTIAL/PLATELET
Abs Immature Granulocytes: 0.01 10*3/uL (ref 0.00–0.07)
Basophils Absolute: 0.1 10*3/uL (ref 0.0–0.1)
Basophils Relative: 1 %
Eosinophils Absolute: 0.2 10*3/uL (ref 0.0–0.5)
Eosinophils Relative: 3 %
HCT: 42.9 % (ref 36.0–46.0)
Hemoglobin: 14.6 g/dL (ref 12.0–15.0)
Immature Granulocytes: 0 %
Lymphocytes Relative: 38 %
Lymphs Abs: 2.2 10*3/uL (ref 0.7–4.0)
MCH: 30.6 pg (ref 26.0–34.0)
MCHC: 34 g/dL (ref 30.0–36.0)
MCV: 89.9 fL (ref 80.0–100.0)
Monocytes Absolute: 0.4 10*3/uL (ref 0.1–1.0)
Monocytes Relative: 8 %
Neutro Abs: 2.9 10*3/uL (ref 1.7–7.7)
Neutrophils Relative %: 50 %
Platelets: 312 10*3/uL (ref 150–400)
RBC: 4.77 MIL/uL (ref 3.87–5.11)
RDW: 12.6 % (ref 11.5–15.5)
WBC: 5.9 10*3/uL (ref 4.0–10.5)
nRBC: 0 % (ref 0.0–0.2)

## 2020-12-30 LAB — VITAMIN B12: Vitamin B-12: 376 pg/mL (ref 180–914)

## 2020-12-30 LAB — T4, FREE: Free T4: 0.76 ng/dL (ref 0.60–1.60)

## 2020-12-30 LAB — TSH: TSH: 2.03 u[IU]/mL (ref 0.35–5.50)

## 2020-12-30 NOTE — Progress Notes (Unsigned)
tpo

## 2020-12-31 LAB — PROTEIN C ACTIVITY: Protein C Activity: 118 % (ref 73–180)

## 2020-12-31 NOTE — Progress Notes (Signed)
Autumn [name],   I will get with Dr. Tasia Catchings and see what her thoughts are.   Faythe Casa, NP 12/31/2020 7:44 AM

## 2021-01-01 ENCOUNTER — Encounter: Payer: Self-pay | Admitting: Oncology

## 2021-01-01 DIAGNOSIS — I2699 Other pulmonary embolism without acute cor pulmonale: Secondary | ICD-10-CM

## 2021-01-01 DIAGNOSIS — Z7901 Long term (current) use of anticoagulants: Secondary | ICD-10-CM

## 2021-01-02 ENCOUNTER — Telehealth: Payer: Self-pay | Admitting: *Deleted

## 2021-01-02 LAB — THYROID PEROXIDASE ANTIBODY: Thyroperoxidase Ab SerPl-aCnc: 1 IU/mL (ref <9)

## 2021-01-02 NOTE — Telephone Encounter (Signed)
Replied to Mychart message to patient regarding this. Waiting for her to let me know when she would be able to do lab

## 2021-01-02 NOTE — Telephone Encounter (Signed)
Patient called asking if the Protein S level was checked which was abnormal 6 months ago. I do not see that it was checked with recent lbs and patient is asking if it can be added so that her upcoming appointment isn't wasted time. The lab said she would have to be redrawn to have it tested because the specimen has to be frozen within 30 minutes of collection. Please advise

## 2021-01-02 NOTE — Telephone Encounter (Signed)
Patient last seen 10/03/2020 and per Dr. Collie Siad notes: Patient tolerates anticoagulation very well.  Recommend to finish total 6 months of anticoagulation given her initial embolism burden. Hypercoagulable work-up done during her admission were normal except slightly decreased protein S activity level. Recommend patient to have level repeated when she comes off her anticoagulation  Dr. Tasia Catchings please advise on patient's question/concern.

## 2021-01-03 ENCOUNTER — Inpatient Hospital Stay: Payer: 59

## 2021-01-03 ENCOUNTER — Ambulatory Visit (INDEPENDENT_AMBULATORY_CARE_PROVIDER_SITE_OTHER): Payer: 59 | Admitting: Psychology

## 2021-01-03 DIAGNOSIS — R070 Pain in throat: Secondary | ICD-10-CM | POA: Diagnosis not present

## 2021-01-03 DIAGNOSIS — Z8616 Personal history of COVID-19: Secondary | ICD-10-CM | POA: Diagnosis not present

## 2021-01-03 DIAGNOSIS — M542 Cervicalgia: Secondary | ICD-10-CM | POA: Diagnosis not present

## 2021-01-03 DIAGNOSIS — F411 Generalized anxiety disorder: Secondary | ICD-10-CM

## 2021-01-03 DIAGNOSIS — Z8673 Personal history of transient ischemic attack (TIA), and cerebral infarction without residual deficits: Secondary | ICD-10-CM | POA: Diagnosis not present

## 2021-01-03 DIAGNOSIS — Z803 Family history of malignant neoplasm of breast: Secondary | ICD-10-CM | POA: Diagnosis not present

## 2021-01-03 DIAGNOSIS — I2699 Other pulmonary embolism without acute cor pulmonale: Secondary | ICD-10-CM | POA: Diagnosis not present

## 2021-01-03 DIAGNOSIS — Z7901 Long term (current) use of anticoagulants: Secondary | ICD-10-CM

## 2021-01-03 DIAGNOSIS — R2681 Unsteadiness on feet: Secondary | ICD-10-CM | POA: Diagnosis not present

## 2021-01-03 DIAGNOSIS — Z87891 Personal history of nicotine dependence: Secondary | ICD-10-CM | POA: Diagnosis not present

## 2021-01-03 DIAGNOSIS — Z809 Family history of malignant neoplasm, unspecified: Secondary | ICD-10-CM | POA: Diagnosis not present

## 2021-01-03 NOTE — Telephone Encounter (Signed)
Patient will do labs this afternoon

## 2021-01-04 LAB — PROTEIN S ACTIVITY: Protein S Activity: 113 % (ref 63–140)

## 2021-01-06 ENCOUNTER — Telehealth: Payer: Self-pay

## 2021-01-06 ENCOUNTER — Inpatient Hospital Stay (HOSPITAL_BASED_OUTPATIENT_CLINIC_OR_DEPARTMENT_OTHER): Payer: 59 | Admitting: Oncology

## 2021-01-06 VITALS — BP 120/88 | HR 81 | Temp 99.2°F | Resp 18 | Wt 248.0 lb

## 2021-01-06 DIAGNOSIS — Z87891 Personal history of nicotine dependence: Secondary | ICD-10-CM | POA: Diagnosis not present

## 2021-01-06 DIAGNOSIS — Z8616 Personal history of COVID-19: Secondary | ICD-10-CM | POA: Diagnosis not present

## 2021-01-06 DIAGNOSIS — Z803 Family history of malignant neoplasm of breast: Secondary | ICD-10-CM | POA: Diagnosis not present

## 2021-01-06 DIAGNOSIS — I2699 Other pulmonary embolism without acute cor pulmonale: Secondary | ICD-10-CM | POA: Diagnosis not present

## 2021-01-06 DIAGNOSIS — Z8673 Personal history of transient ischemic attack (TIA), and cerebral infarction without residual deficits: Secondary | ICD-10-CM | POA: Diagnosis not present

## 2021-01-06 DIAGNOSIS — E049 Nontoxic goiter, unspecified: Secondary | ICD-10-CM

## 2021-01-06 DIAGNOSIS — Z809 Family history of malignant neoplasm, unspecified: Secondary | ICD-10-CM | POA: Diagnosis not present

## 2021-01-06 DIAGNOSIS — R070 Pain in throat: Secondary | ICD-10-CM | POA: Diagnosis not present

## 2021-01-06 DIAGNOSIS — M542 Cervicalgia: Secondary | ICD-10-CM | POA: Diagnosis not present

## 2021-01-06 DIAGNOSIS — R2681 Unsteadiness on feet: Secondary | ICD-10-CM | POA: Diagnosis not present

## 2021-01-06 NOTE — Progress Notes (Signed)
Patient here for oncology follow-up appointment, concerns of neck pain, dizziness, questions about thyroid nodule

## 2021-01-06 NOTE — Progress Notes (Signed)
Hematology/Oncology Consult note [facility] [phone] [phone]   Patient Care Team: Clark, Katherine K, NP as PCP - General (Internal Medicine) Gollan, Timothy J, MD as Consulting Physician (Cardiology)  REFERRING PROVIDER: Clark, Katherine K, NP  CHIEF COMPLAINTS/REASON FOR VISIT:  Evaluation of acute pulmonary embolism  HISTORY OF PRESENTING ILLNESS: Autumn [name] is a 35-year-old female with past medical history significant for migraines, viral URI, anxiety and depression, prediabetes, tachycardia who is being followed by Dr. Yu for bilateral pulmonary embolism on anticoagulation.  She was hospitalized from 06/22/2020 - 06/25/2020 for acute bilateral PE.  She presented with left chest/flank pain prompting evaluation.  She tested positive for COVID with home test but negative when in the ED.  Noted to be on oral contraceptives as well.  She was started on IV heparin and then transition to Eliquis.  Hypercoagulable work-up obtained while inpatient which was essentially negative except for slightly low protein S activity.  Denies any issues with Eliquis.  INTERVAL HISTORY She presents back today for follow-up.  Reports good tolerance of Eliquis.  No active bleeding events.  In the interim, she was worked up for balance/visual disturbance x3 months and referred to neurology.  Work-up included brain MRI which showed tiny remote lacunar infarcts in the bilateral cerebellar hemisphere.  It was unclear if this was related to her balance concerns but a TEE was ordered-CTA head and neck.  She met with cardiology on 12/08/2020 and had TEE completed on 12/13/2020 which showed EF of greater than 55%.  CTA head and neck showed incidental findings of nonspecific mildly enlarged right level 2 lymph node measuring up to 12 mm in size and a 1 mm inferiorly projecting vascular protrusion arising from the supraclinoid left ICA which may reflect an  aneurysm.  She was also treated for a sinus infection and sore throat and was on several courses of antibiotics and steroids with only temporary improvement of her symptoms.  Continue to have persistent sore throat leading to thyroid ultrasound which showed an enlarged thyroid gland and right cervical lymph nodes measuring up to 1 cm in size.  Thought to be reactive in nature but surveillance was recommended.  Today, [name] returns back to discuss recent lab work.  Reports some improvement of her balance issues although not completely.  Is very concerned about her enlarged thyroid and lymphadenopathy.  She has stopped Eliquis.   Review of Systems  Constitutional:  Positive for fatigue. Negative for appetite change, fever and unexpected weight change.  HENT:   Positive for sore throat and trouble swallowing. Negative for nosebleeds.   Eyes: Negative.   Respiratory: Negative.  Negative for cough, shortness of breath and wheezing.   Cardiovascular: Negative.  Negative for chest pain and leg swelling.  Gastrointestinal:  Negative for abdominal pain, blood in stool, constipation, diarrhea, nausea and vomiting.  Endocrine: Negative.   Genitourinary: Negative.  Negative for bladder incontinence, hematuria and nocturia.   Musculoskeletal: Negative.  Negative for back pain and flank pain.  Skin: Negative.   Neurological:  Positive for dizziness. Negative for headaches, light-headedness and numbness.  Hematological: Negative.   Psychiatric/Behavioral:  Negative for confusion. The patient is nervous/anxious.    MEDICAL HISTORY:  Past Medical History:  Diagnosis Date   Anxiety    Back pain    chronic   Bilateral pulmonary embolism (HCC)    a. 06/2020 following COVID infection.   COVID-19 virus infection 06/2020   Depression    Disc disease, degenerative,   lumbar or lumbosacral    History of echocardiogram    a. 06/2020 Echo: EF 55-60%, no rwma, nl RV size/fxn.   History of ovarian cyst     Migraine    Palpitations    a. 2/022 Zio: RSR, 85 (47-112). Rare PACs/PVCs. No significant arrhythmias/prolonged pauses.   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    SURGICAL HISTORY: Past Surgical History:  Procedure Laterality Date   APPENDECTOMY     BREAST SURGERY Right 2008   lump/ benign   TEE WITHOUT CARDIOVERSION N/A 12/13/2020   Procedure: TRANSESOPHAGEAL ECHOCARDIOGRAM (TEE);  Surgeon: Nelva Bush, MD;  Location: [facility] ORS;  Service: Cardiovascular;  Laterality: N/A;    SOCIAL HISTORY: Social History   Socioeconomic History   Marital status: Married    Spouse name: Not on file   Number of children: 1   Years of education: Not on file   Highest education level: Not on file  Occupational History   Occupation: care manager  Tobacco Use   Smoking status: Former    Years: 10.00    Types: Cigarettes    Quit date: 08/18/2016    Years since quitting: 4.3   Smokeless tobacco: Never   Tobacco comments:    3 cigarettes a day  Vaping Use   Vaping Use: Never used  Substance and Sexual Activity   Alcohol use: Yes    Alcohol/week: 0.0 standard drinks    Comment: occasional   Drug use: No   Sexual activity: Yes    Birth control/protection: Pill  Other Topics Concern   Not on file  Social History Narrative   Right handed   Social Determinants of Health   Financial Resource Strain: Not on file  Food Insecurity: Not on file  Transportation Needs: Not on file  Physical Activity: Not on file  Stress: Not on file  Social Connections: Not on file  Intimate Partner Violence: Not on file    FAMILY HISTORY: Family History  Problem Relation Age of Onset   Cancer Mother 30       breast   Breast cancer Mother 59       and 40   Alcohol abuse Father    Vision loss Maternal Grandmother    Heart failure Maternal Grandmother    Cancer Maternal Grandfather    Alcohol abuse Paternal  Grandfather    Arthritis Maternal Aunt    Anxiety disorder Maternal Aunt    Depression Maternal Aunt     ALLERGIES:  is allergic to imitrex [sumatriptan] and iodinated diagnostic agents.  MEDICATIONS:  Current Outpatient Medications  Medication Sig Dispense Refill   acetaminophen (TYLENOL) 500 MG tablet Take 1,000 mg by mouth every 6 (six) hours as needed (headaches/migraines/pain).     aspirin 81 MG chewable tablet Chew by mouth daily.     escitalopram (LEXAPRO) 20 MG tablet Take 1 tablet (20 mg total) by mouth daily. 90 tablet 0   valACYclovir (VALTREX) 1000 MG tablet Take 2 g by mouth 2 (two) times daily as needed (fever blisters).     apixaban (ELIQUIS) 5 MG TABS tablet TAKE 1 TABLET (5 MG TOTAL) BY MOUTH 2 (TWO) TIMES DAILY. (Patient not taking: Reported on 01/06/2021) 60 tablet 1   No current facility-administered medications for this visit.     PHYSICAL EXAMINATION: ECOG PERFORMANCE STATUS: 1 - Symptomatic but completely ambulatory Vitals:   01/06/21 1402  BP: 120/88  Pulse: 81  Resp: 18  Temp: 99.2 F (37.3 C)  SpO2: 98%   Filed Weights   01/06/21 1402  Weight: 248 lb (112.5 kg)    Physical Exam Constitutional:      Appearance: Normal appearance.  HENT:     Head: Normocephalic and atraumatic.  Eyes:     Pupils: Pupils are equal, round, and reactive to light.  Cardiovascular:     Rate and Rhythm: Normal rate and regular rhythm.     Heart sounds: Normal heart sounds. No murmur heard. Pulmonary:     Effort: Pulmonary effort is normal.     Breath sounds: Normal breath sounds. No wheezing.  Abdominal:     General: Bowel sounds are normal. There is no distension.     Palpations: Abdomen is soft.     Tenderness: There is no abdominal tenderness.  Musculoskeletal:        General: Normal range of motion.     Cervical back: Normal range of motion.  Skin:    General: Skin is warm and dry.     Findings: No rash.  Neurological:     Mental Status: She is alert  and oriented to person, place, and time.  Psychiatric:        Judgment: Judgment normal.    LABORATORY DATA:  I have reviewed the data as listed Lab Results  Component Value Date   WBC 5.9 12/30/2020   HGB 14.6 12/30/2020   HCT 42.9 12/30/2020   MCV 89.9 12/30/2020   PLT 312 12/30/2020   Recent Labs    06/22/20 0615 06/23/20 0500 10/03/20 1254 12/08/20 1652 12/30/20 0845  NA 140   < > 138 138 137  K 3.9   < > 4.0 4.4 4.0  CL 105   < > 101 104 104  CO2 23   < > 25 24 22  GLUCOSE 111*   < > 125* 113* 98  BUN 13   < > 10 15 11  CREATININE 0.75   < > 0.80 0.67 0.72  CALCIUM 9.2   < > 8.8* 9.3 8.9  GFRNONAA >60   < > >60 >60 >60  PROT 7.3  --  7.3  --  7.5  ALBUMIN 3.7  --  4.1  --  4.2  AST 19  --  37  --  31  ALT 25  --  63*  --  45*  ALKPHOS 52  --  61  --  63  BILITOT 0.7  --  0.5  --  0.4   < > = values in this interval not displayed.    Iron/TIBC/Ferritin/ %Sat No results found for: IRON, TIBC, FERRITIN, IRONPCTSAT    RADIOGRAPHIC STUDIES: I have personally reviewed the radiological images as listed and agreed with the findings in the report. CT ANGIO HEAD W OR WO CONTRAST  Result Date: 12/27/2020 CLINICAL DATA:  Provided history: Pressure in head. Ataxia. Chronic migraine without aura with status migrainosus, not intractable. Old strokes in the cerebellum. Additional history provided by scanning technologist: Patient reports chronic migraines, pressure feeling in head, ataxia, history of stroke 2022. EXAM: CT ANGIOGRAPHY HEAD AND NECK TECHNIQUE: Multidetector CT imaging of the head and neck was performed using the standard protocol during bolus administration of intravenous contrast. Multiplanar CT image reconstructions and MIPs were obtained to evaluate the vascular anatomy. Carotid stenosis measurements (when applicable) are obtained utilizing NASCET criteria, using the distal internal carotid diameter as the denominator. CONTRAST:    75mL ISOVUE-370 IOPAMIDOL  (ISOVUE-370) INJECTION 76% COMPARISON:  Brain MRI examinations 12/01/2020 and 01/01/2012. FINDINGS: CT HEAD FINDINGS Brain: Cerebral volume is normal. The tiny chronic lacunar infarcts within the bilateral cerebellar hemispheres reported on the prior brain MRI of 12/01/2020 are not appreciable by CT. There is no acute intracranial hemorrhage. No demarcated cortical infarct. No extra-axial fluid collection. No evidence of an intracranial mass. No midline shift. Cavum septum pellucidum and cavum vergae. Vascular: No hyperdense vessel Skull: Normal. Negative for fracture or focal lesion. Sinuses: No significant paranasal sinus disease. Orbits: No mass or acute finding. Review of the MIP images confirms the above findings CTA NECK FINDINGS Aortic arch: Standard aortic branching. The visualized aortic arch is normal in caliber. No hemodynamically significant innominate or proximal subclavian artery stenosis. Right carotid system: CCA and ICA patent within the neck without stenosis or significant atherosclerotic disease. Left carotid system: CCA and ICA patent within the neck without stenosis or significant atherosclerotic disease. Vertebral arteries: Patent within the neck without stenosis. The right vertebral artery is slightly dominant. Skeleton: No acute bony abnormality or aggressive osseous lesion. Other neck: No neck mass is identified. Nonspecific mildly enlarged right level 2 lymph nodes measuring up to 12 mm in short axis. Upper chest: No consolidation within the imaged lung apices. Minimal dependent atelectasis within the lung apices. Review of the MIP images confirms the above findings CTA HEAD FINDINGS Anterior circulation: The intracranial internal carotid arteries are patent. The M1 middle cerebral arteries are patent. No M2 proximal branch occlusion or high-grade proximal stenosis is identified. The anterior cerebral arteries are patent. 1 mm inferiorly projecting vascular protrusion arising from the  supraclinoid left ICA, which may reflect an infundibulum or aneurysm (series 19, image 132). Posterior circulation: The intracranial vertebral arteries are patent. The basilar artery is patent. The posterior cerebral arteries are patent. The posterior cerebral arteries are patent. Posterior communicating arteries are hypoplastic or absent bilaterally. Venous sinuses: Within the limitations of contrast timing, no convincing thrombus. Anatomic variants: As described Review of the MIP images confirms the above findings IMPRESSION: CT head: 1. No evidence of acute intracranial abnormality. 2. The tiny chronic lacunar infarcts within the bilateral cerebellar hemispheres reported on the prior brain MRI of 12/01/2020 are not appreciable by CT. CTA neck: 1. The common carotid, internal carotid and vertebral arteries are patent within the neck without stenosis. 2. Nonspecific mildly enlarged right level 2 lymph nodes measuring up to 12 mm in short axis. Clinical correlation and clinical follow-up recommended (with imaging follow-up as warranted). CTA head: 1. No intracranial large vessel occlusion or proximal high-grade stenosis. 2. 1 mm inferiorly projecting vascular protrusion arising from the supraclinoid left ICA, which may reflect an infundibulum or aneurysm. Electronically Signed   By: Kyle  Golden DO   On: 12/27/2020 10:42   CT ANGIO NECK W OR WO CONTRAST  Result Date: 12/27/2020 CLINICAL DATA:  Provided history: Pressure in head. Ataxia. Chronic migraine without aura with status migrainosus, not intractable. Old strokes in the cerebellum. Additional history provided by scanning technologist: Patient reports chronic migraines, pressure feeling in head, ataxia, history of stroke 2022. EXAM: CT ANGIOGRAPHY HEAD AND NECK TECHNIQUE: Multidetector CT imaging of the head and neck was performed using the standard protocol during bolus administration of intravenous contrast. Multiplanar CT image reconstructions and MIPs  were obtained to evaluate the vascular anatomy. Carotid stenosis measurements (when applicable) are obtained utilizing NASCET criteria, using the distal internal carotid diameter as the denominator. CONTRAST:  75mL ISOVUE-370   IOPAMIDOL (ISOVUE-370) INJECTION 76% COMPARISON:  Brain MRI examinations 12/01/2020 and 01/01/2012. FINDINGS: CT HEAD FINDINGS Brain: Cerebral volume is normal. The tiny chronic lacunar infarcts within the bilateral cerebellar hemispheres reported on the prior brain MRI of 12/01/2020 are not appreciable by CT. There is no acute intracranial hemorrhage. No demarcated cortical infarct. No extra-axial fluid collection. No evidence of an intracranial mass. No midline shift. Cavum septum pellucidum and cavum vergae. Vascular: No hyperdense vessel Skull: Normal. Negative for fracture or focal lesion. Sinuses: No significant paranasal sinus disease. Orbits: No mass or acute finding. Review of the MIP images confirms the above findings CTA NECK FINDINGS Aortic arch: Standard aortic branching. The visualized aortic arch is normal in caliber. No hemodynamically significant innominate or proximal subclavian artery stenosis. Right carotid system: CCA and ICA patent within the neck without stenosis or significant atherosclerotic disease. Left carotid system: CCA and ICA patent within the neck without stenosis or significant atherosclerotic disease. Vertebral arteries: Patent within the neck without stenosis. The right vertebral artery is slightly dominant. Skeleton: No acute bony abnormality or aggressive osseous lesion. Other neck: No neck mass is identified. Nonspecific mildly enlarged right level 2 lymph nodes measuring up to 12 mm in short axis. Upper chest: No consolidation within the imaged lung apices. Minimal dependent atelectasis within the lung apices. Review of the MIP images confirms the above findings CTA HEAD FINDINGS Anterior circulation: The intracranial internal carotid arteries are patent.  The M1 middle cerebral arteries are patent. No M2 proximal branch occlusion or high-grade proximal stenosis is identified. The anterior cerebral arteries are patent. 1 mm inferiorly projecting vascular protrusion arising from the supraclinoid left ICA, which may reflect an infundibulum or aneurysm (series 19, image 132). Posterior circulation: The intracranial vertebral arteries are patent. The basilar artery is patent. The posterior cerebral arteries are patent. The posterior cerebral arteries are patent. Posterior communicating arteries are hypoplastic or absent bilaterally. Venous sinuses: Within the limitations of contrast timing, no convincing thrombus. Anatomic variants: As described Review of the MIP images confirms the above findings IMPRESSION: CT head: 1. No evidence of acute intracranial abnormality. 2. The tiny chronic lacunar infarcts within the bilateral cerebellar hemispheres reported on the prior brain MRI of 12/01/2020 are not appreciable by CT. CTA neck: 1. The common carotid, internal carotid and vertebral arteries are patent within the neck without stenosis. 2. Nonspecific mildly enlarged right level 2 lymph nodes measuring up to 12 mm in short axis. Clinical correlation and clinical follow-up recommended (with imaging follow-up as warranted). CTA head: 1. No intracranial large vessel occlusion or proximal high-grade stenosis. 2. 1 mm inferiorly projecting vascular protrusion arising from the supraclinoid left ICA, which may reflect an infundibulum or aneurysm. Electronically Signed   By: Kyle  Golden DO   On: 12/27/2020 10:42   ECHO TEE  Result Date: 12/13/2020    TRANSESOPHOGEAL ECHO REPORT   Patient Name:   Shirlean V Swinger Date of Exam: 12/13/2020 Medical Rec #:  [id number]            Height:       70.0 in Accession #:    [id number]           Weight:       250.0 lb Date of Birth:  07/26/1985            BSA:          2.295 m Patient Age:    34 years               BP:           114/81 mmHg  Patient Gender: F                    HR:           80 bpm. Exam Location:  [facility] Procedure: Transesophageal Echo, Color Doppler, Cardiac Doppler and Saline            Contrast Bubble Study Indications:     Cerebral infarction , unspecified I63.9  History:         Patient has prior history of Echocardiogram examinations, most                  recent 06/23/2020. Stroke. Bilateral PE.  Sonographer:     Sherrie Sport RDCS (AE) Referring Phys:  [id number] CHRISTOPHER END Diagnosing Phys: Harrell Gave End MD PROCEDURE: After discussion of the risks and benefits of a TEE, an informed consent was obtained from the patient. The transesophogeal probe was passed without difficulty through the esophogus of the patient. Imaged were obtained with the patient in a left lateral decubitus position. Local oropharyngeal anesthetic was provided with viscous lidocaine. Sedation performed by different physician. The patient was monitored while under deep sedation. Image quality was good. The patient's vital signs; including heart rate, blood pressure, and oxygen saturation; remained stable throughout the procedure. The patient developed no complications during the procedure. IMPRESSIONS  1. Left ventricular ejection fraction, by estimation, is >55%. The left ventricle has normal function.  2. Right ventricular systolic function is normal. The right ventricular size is normal.  3. No left atrial/left atrial appendage thrombus was detected.  4. The mitral valve is normal in structure. Trivial mitral valve regurgitation.  5. The aortic valve is tricuspid. Aortic valve regurgitation is trivial.  6. Agitated saline contrast bubble study was negative, with no evidence of any interatrial shunt. Conclusion(s)/Recommendation(s): No LA/LAA thrombus identified. Negative bubble study for interatrial shunt. No intracardiac source of embolism detected on this on this transesophageal echocardiogram. FINDINGS  Left Ventricle: Left ventricular ejection fraction, by  estimation, is >55%. The left ventricle has normal function. The left ventricular internal cavity size was normal in size. Right Ventricle: The right ventricular size is normal. Right ventricular systolic function is normal. Left Atrium: Left atrial size was normal in size. No left atrial/left atrial appendage thrombus was detected. Right Atrium: Right atrial size was normal in size. Pericardium: There is no evidence of pericardial effusion. Mitral Valve: The mitral valve is normal in structure. Trivial mitral valve regurgitation. Tricuspid Valve: The tricuspid valve is not well visualized. Tricuspid valve regurgitation is mild. Aortic Valve: The aortic valve is tricuspid. Aortic valve regurgitation is trivial. Pulmonic Valve: The pulmonic valve was not well visualized. Pulmonic valve regurgitation is not visualized. Aorta: The aortic root and ascending aorta are structurally normal, with no evidence of dilitation. IAS/Shunts: No atrial level shunt detected by color flow Doppler. Agitated saline contrast was given intravenously to evaluate for intracardiac shunting. Agitated saline contrast bubble study was negative, with no evidence of any interatrial shunt. Nelva Bush MD Electronically signed by Nelva Bush MD Signature Date/Time: 12/13/2020/8:14:48 AM    Final    US THYROID  Result Date: 12/29/2020 CLINICAL DATA:  Palpable abnormality. Thyromegaly. Possible right-sided thyroid nodule. EXAM: THYROID ULTRASOUND TECHNIQUE: Ultrasound examination of the thyroid gland and adjacent soft tissues was performed. COMPARISON:  None. FINDINGS: Parenchymal Echotexture: Mildly heterogenous Isthmus: 0.5 cm Right lobe: 4.9 x 1.9 x 1.6 cm Left lobe:  5.0 x 1.5 x 1.7 cm _________________________________________________________ Estimated total number of nodules >/= 1 cm: 0 Number of spongiform nodules >/=  2 cm not described below (TR1): 0 Number of mixed cystic and solid nodules >/= 1.5 cm not described below ([place]): 0  _________________________________________________________ No discrete nodules are seen within the thyroid gland. The thyroid parenchyma is mildly heterogeneous. Prominent lymph nodes present along the jugular chain in the right neck. The largest measures 1.0 cm in short axis. The architecture of the nodes is maintained. Normal fatty hilum and vascular pedicle. Slightly thickened cortex. IMPRESSION: 1. Mildly heterogeneous and enlarged thyroid gland. Does the patient have a history of thyroiditis? 2. Prominent/borderline enlarged right cervical lymph nodes measuring up to 1 cm in short axis. Lymph nodes are very likely reactive in nature. However, continued clinical surveillance is recommended. If there is evidence of growth over time, repeat imaging may become warranted. Electronically Signed   By: Jacqulynn Cadet M.D.   On: 12/29/2020 15:22      ASSESSMENT & PLAN: Ms. Madrid presents back to discuss continuation of Eliquis and to review most recent lab work.  Bilateral pulmonary embolus-provoked- Thought to be secondary to OCP use plus COVID-19 infection.  She has completed a total of 6 months of anticoagulation with improvement of her symptoms.  Hypercoagulable work-up during admission was normal except for slightly decreased protein S activity level.  This was repeated and is now normal off Eliquis.  Patient is okay to come off her anticoagulation at this time.  Recommend baby aspirin 81 mg daily.  Sore throat/recurrent sinus infection- She was treated with several courses of antibiotics and steroids for presumed sinus infection.  She was also screened for strep throat and mono which were both negative.  Had ultrasound of her thyroid completed on 12/29/2020 which showed thyromegaly and enlarged right cervical lymph nodes that were likely reactive in nature.  She continues to have associated sore throat and neck pain.  Likely thyroiditis and patient needs to see endocrinology.   Balancing  issues- Secondary to age indeterminant cerebellar infarct as evidenced by MRI of brain.  Had TEE which showed EF of greater than 55% but no other abnormality.  She was evaluated by neurology and cardiology.   Disposition- Referral to endocrinology Referral to ENT Stop Eliquis. Start baby aspirin 81 mg daily No additional follow-up needed in our clinic.  I spent 25 minutes dedicated to the care of this patient (face-to-face and non-face-to-face) on the date of the encounter to include what is described in the assessment and plan.  No orders of the defined types were placed in this encounter.   All questions were answered. The patient knows to call the clinic with any problems questions or concerns.  cc Pleas Koch, NP   Faythe Casa, NP 01/10/2021 1:10 PM

## 2021-01-10 ENCOUNTER — Telehealth: Payer: Self-pay

## 2021-01-10 NOTE — Telephone Encounter (Signed)
Referral faxed to [facility] endocrinology. Fax transmission confirmation received at [place].

## 2021-01-11 ENCOUNTER — Other Ambulatory Visit: Payer: Self-pay

## 2021-01-11 ENCOUNTER — Encounter: Payer: Self-pay | Admitting: Oncology

## 2021-01-11 ENCOUNTER — Encounter: Payer: Self-pay | Admitting: Psychiatry

## 2021-01-11 ENCOUNTER — Telehealth (INDEPENDENT_AMBULATORY_CARE_PROVIDER_SITE_OTHER): Payer: 59 | Admitting: Psychiatry

## 2021-01-11 DIAGNOSIS — F419 Anxiety disorder, unspecified: Secondary | ICD-10-CM | POA: Diagnosis not present

## 2021-01-11 DIAGNOSIS — F3342 Major depressive disorder, recurrent, in full remission: Secondary | ICD-10-CM

## 2021-01-11 MED ORDER — ESCITALOPRAM OXALATE 20 MG PO TABS
20.0000 mg | ORAL_TABLET | Freq: Every day | ORAL | 0 refills | Status: DC
  Filled 2021-01-11 – 2021-02-20 (×2): qty 90, 90d supply, fill #0

## 2021-01-11 NOTE — Progress Notes (Signed)
Virtual Visit via Video Note  I connected with Autumn [name] on 01/11/21 at  2:00 PM EDT by a video enabled telemedicine application and verified that I am speaking with the correct person using two identifiers.  Location Provider Location : [facility] Patient Location : Car  Participants: Patient ,Friend, Provider    I discussed the limitations of evaluation and management by telemedicine and the availability of in person appointments. The patient expressed understanding and agreed to proceed.   I discussed the assessment and treatment plan with the patient. The patient was provided an opportunity to ask questions and all were answered. The patient agreed with the plan and demonstrated an understanding of the instructions.   The patient was advised to call back or seek an in-person evaluation if the symptoms worsen or if the condition fails to improve as anticipated.  [place] MD OP Progress Note  01/11/2021 2:48 PM Autumn [name]  MRN:  [id number]  Chief Complaint:  Chief Complaint   Follow-up; Depression; Anxiety    HPI: Autumn [name] is a 35 year old Caucasian female who has a history of MDD in remission, anxiety disorder unspecified, prediabetic, history of tachycardia, history of bilateral pulmonary embolism on anticoagulation was evaluated by telemedicine today.  Patient with recent balance issues had an MRI of the brain, which showed cerebellar infarct,TEE showed ejection fraction of greater than 55% but no other abnormality, patient also with thyromegaly and enlarged right cervical lymph node has been referred to endocrinology, started on baby aspirin 81 mg.  Patient today reports she is currently anxious about everything that is going on in her life with regards to her health.  She however reports her family has been supportive.  She continues to follow-up with her therapist and that helps.  She denies any significant depression however does report problems with  concentration.  She reports she is not interested in medication changes yet and wants to give it time.  She reports work is going well so far.  She denies any suicidality, homicidality or perceptual disturbances.  Patient denies any other concerns today.  Visit Diagnosis:    ICD-10-CM   1. MDD (major depressive disorder), recurrent, in full remission ([place])  F33.42 escitalopram (LEXAPRO) 20 MG tablet    2. Anxiety disorder, unspecified type  F41.9 escitalopram (LEXAPRO) 20 MG tablet      Past Psychiatric History: Reviewed past psychiatric history from progress note on 02/10/2020.  Past trials of Lexapro, Wellbutrin-noncompliant  Past Medical History:  Past Medical History:  Diagnosis Date   Anxiety    Back pain    chronic   Bilateral pulmonary embolism ([place])    a. 06/2020 following COVID infection.   COVID-19 virus infection 06/2020   Depression    Disc disease, degenerative, lumbar or lumbosacral    History of echocardiogram    a. 06/2020 Echo: EF 55-60%, no rwma, nl RV size/fxn.   History of ovarian cyst    Migraine    Palpitations    a. 2/022 Zio: RSR, 85 (47-112). Rare PACs/PVCs. No significant arrhythmias/prolonged pauses.   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    Past Surgical History:  Procedure Laterality Date   APPENDECTOMY     BREAST SURGERY Right 2008   lump/ benign   TEE WITHOUT CARDIOVERSION N/A 12/13/2020   Procedure: TRANSESOPHAGEAL ECHOCARDIOGRAM (TEE);  Surgeon: Nelva Bush, MD;  Location: [facility] ORS;  Service: Cardiovascular;  Laterality: N/A;    Family Psychiatric History: Reviewed family psychiatric history from progress note on 02/10/2020  Family History:  Family History  Problem Relation Age of Onset   Cancer Mother 17       breast   Breast cancer Mother 21       and 55   Alcohol abuse Father    Vision loss Maternal Grandmother    Heart failure Maternal  Grandmother    Cancer Maternal Grandfather    Alcohol abuse Paternal Grandfather    Arthritis Maternal Aunt    Anxiety disorder Maternal Aunt    Depression Maternal Aunt     Social History: Reviewed social history from progress note on 02/10/2020 Social History   Socioeconomic History   Marital status: Married    Spouse name: Not on file   Number of children: 1   Years of education: Not on file   Highest education level: Not on file  Occupational History   Occupation: Transport planner  Tobacco Use   Smoking status: Former    Years: 10.00    Types: Cigarettes    Quit date: 08/18/2016    Years since quitting: 4.4   Smokeless tobacco: Never   Tobacco comments:    3 cigarettes a day  Vaping Use   Vaping Use: Never used  Substance and Sexual Activity   Alcohol use: Yes    Alcohol/week: 0.0 standard drinks    Comment: occasional   Drug use: No   Sexual activity: Yes    Birth control/protection: Pill  Other Topics Concern   Not on file  Social History Narrative   Right handed   Social Determinants of Health   Financial Resource Strain: Not on file  Food Insecurity: Not on file  Transportation Needs: Not on file  Physical Activity: Not on file  Stress: Not on file  Social Connections: Not on file    Allergies:  Allergies  Allergen Reactions   Imitrex [Sumatriptan] Other (See Comments)    Vomiting, Slurred Speech and Facial Numbness   Iodinated Diagnostic Agents Itching and Cough    Patient experienced sneezing, throat itching, and "feeling like needing to cough" Given 25 mg benadryl and observed x 20 mins/MMS Per dr Polly Cobia patient should have 13 hour prep   Other Itching    States reaction to contrast dye used for CTA of her brain - itching of tongue, tingling lower lip, coughing    Metabolic Disorder Labs: Lab Results  Component Value Date   HGBA1C 6.1 12/08/2020   No results found for: PROLACTIN Lab Results  Component Value Date   CHOL 146 12/08/2020   TRIG  94.0 12/08/2020   HDL 50.80 12/08/2020   CHOLHDL 3 12/08/2020   VLDL 18.8 12/08/2020   LDLCALC 77 12/08/2020   LDLCALC 115 (H) 06/28/2020   Lab Results  Component Value Date   TSH 2.03 12/30/2020   TSH 1.029 07/08/2020    Therapeutic Level Labs: No results found for: LITHIUM No results found for: VALPROATE No components found for:  CBMZ  Current Medications: Current Outpatient Medications  Medication Sig Dispense Refill   acetaminophen (TYLENOL) 500 MG tablet Take 1,000 mg by mouth every 6 (six) hours as needed (headaches/migraines/pain).     apixaban (ELIQUIS) 5 MG TABS tablet TAKE 1 TABLET (5 MG TOTAL) BY MOUTH 2 (TWO) TIMES DAILY. (Patient not taking: Reported on 01/06/2021) 60 tablet 1   aspirin 81 MG chewable tablet Chew by mouth daily.  aspirin 81 MG EC tablet Take by mouth.     escitalopram (LEXAPRO) 20 MG tablet Take 1 tablet (20 mg total) by mouth daily. 90 tablet 0   valACYclovir (VALTREX) 1000 MG tablet Take 2 g by mouth 2 (two) times daily as needed (fever blisters).     No current facility-administered medications for this visit.     Musculoskeletal: Strength & Muscle Tone:  UTA Gait & Station:  UTA Patient leans: N/A  Psychiatric Specialty Exam: Review of Systems  Neurological:        Balance problem  Psychiatric/Behavioral:  The patient is nervous/anxious.   All other systems reviewed and are negative.  There were no vitals taken for this visit.There is no height or weight on file to calculate BMI.  General Appearance: Casual  Eye Contact:  Good  Speech:  Clear and Coherent  Volume:  Normal  Mood:  Anxious  Affect:  Congruent  Thought Process:  Goal Directed and Descriptions of Associations: Intact  Orientation:  Full (Time, Place, and Person)  Thought Content: Logical   Suicidal Thoughts:  No  Homicidal Thoughts:  No  Memory:  Immediate;   Fair Recent;   Fair Remote;   Fair  Judgement:  Fair  Insight:  Fair  Psychomotor Activity:  Normal   Concentration:  Concentration: Fair and Attention Span: Fair  Recall:  [place] of Knowledge: Good  Language: Good  Akathisia:  No  Handed:  Right  AIMS (if indicated): not done  Assets:  Communication Skills Desire for [place] Talents/Skills Transportation Vocational/Educational  ADL's:  Intact  Cognition: WNL  Sleep:  Fair   Screenings: GAD-7    Flowsheet Row Video Visit from 01/11/2021 in [place] Video Visit from 10/12/2020 in [place] from 02/10/2020 in [place] from 10/28/2017 in [place]  Total GAD-7 Score '2 5 3 4      '$ PHQ2-9    [place] Video Visit from 01/11/2021 in [place] Video Visit from 10/12/2020 in [place] from 02/10/2020 in [place] from 11/12/2018 in [place] Visit from 10/28/2017 in [place]  PHQ-2 Total Score '1 2 1 '$ 0 0  PHQ-9 Total Score 8 8 -- -- 3      Flowsheet Row Video Visit from 01/11/2021 in [place] Video Visit from 10/12/2020 in [place] No Risk No Risk        Assessment and Plan: Autumn [name] is a 35 year old Caucasian female, employed, married, lives in [place], has a history of depression, anxiety was evaluated by telemedicine today.  Patient is biologically predisposed given family history.  Patient with current psychosocial stressors of medical problems including recent changes in her health as well as upcoming referral to endocrinology.  She will continue to need psychotherapy sessions.  Plan MDD in remission Lexapro 10 mg p.o. daily   Anxiety disorder unspecified-rule out generalized anxiety  disorder-stable Continue propranolol 10 mg p.o. twice daily as needed   I have reviewed notes per Ms. Loma Boston dated 12/20/2020, [place]-dated 12/29/2020 as noted above.  Patient has been referred to endocrinology for further work-up.  Reviewed labs-12/30/2020-vitamin B12-376-goal more than 500, discussed patient to take vitamin B12 supplements.  TSH-dated 12/30/2020-within normal limits.  We will coordinate care, discussed to sign a release to obtain  medical records from endocrinology.  Follow-up in clinic in 2 to 3 months or sooner if needed.  In person.  This note was generated in part or whole with voice recognition software. Voice recognition is usually quite accurate but there are transcription errors that can and very often do occur. I apologize for any typographical errors that were not detected and corrected.     Ursula Alert, MD 01/12/2021, 9:05 AM

## 2021-01-12 ENCOUNTER — Other Ambulatory Visit: Payer: Self-pay

## 2021-01-12 DIAGNOSIS — E049 Nontoxic goiter, unspecified: Secondary | ICD-10-CM

## 2021-01-31 ENCOUNTER — Encounter: Payer: Self-pay | Admitting: Family Medicine

## 2021-01-31 ENCOUNTER — Telehealth: Payer: Self-pay | Admitting: *Deleted

## 2021-01-31 ENCOUNTER — Ambulatory Visit: Payer: 59 | Admitting: Family Medicine

## 2021-01-31 ENCOUNTER — Ambulatory Visit
Admission: RE | Admit: 2021-01-31 | Discharge: 2021-01-31 | Disposition: A | Discharge status: Home / Self Care | Payer: 59 | Source: Ambulatory Visit | Attending: Family Medicine | Admitting: Family Medicine

## 2021-01-31 ENCOUNTER — Other Ambulatory Visit: Payer: Self-pay | Admitting: Family Medicine

## 2021-01-31 ENCOUNTER — Other Ambulatory Visit: Payer: Self-pay

## 2021-01-31 DIAGNOSIS — M79604 Pain in right leg: Secondary | ICD-10-CM | POA: Insufficient documentation

## 2021-01-31 DIAGNOSIS — M79661 Pain in right lower leg: Secondary | ICD-10-CM

## 2021-01-31 NOTE — Telephone Encounter (Signed)
Caryl Pina at [facility] called to let Dr. Damita Dunnings know that the results are in [place]. Caryl Pina stated that the results were negative.

## 2021-01-31 NOTE — Patient Instructions (Addendum)
This could be a calf strain but very reasonable to get the ultrasound done.  Let me get that and we'll go from there.  Take care.  Glad to see you.

## 2021-01-31 NOTE — Progress Notes (Signed)
This visit occurred during the SARS-CoV-2 public health emergency.  Safety protocols were in place, including screening questions prior to the visit, additional usage of staff PPE, and extensive cleaning of exam room while observing appropriate contact time as indicated for disinfecting solutions.  Covid 05/2020, with PE.   Treated with eliquis. Off eliquis now.  She had gait change and noted lacunar stroke on imaging.   No issue.  R calf pain.  Recently noted.  U/s pending for 3 PM today.  Still on aspirin.  Noted for the last few days.  Now with a local tingling sensation, "like it is going to sleep."  No bruising, no redness, no local heat.  Able to walk but it feels tight.  No L sided sx.  No CP.  Not SOB.  No bleeding or bruising.    Meds, vitals, and allergies reviewed.   ROS: Per HPI unless specifically indicated in ROS section   Nad Ncat Neck supple, no LA Rrr Ctab Abd soft, normal BS 46cm calf B but R calf is ttp w/o redness.  L calf not ttp.  Achilles not ttp B.   Normal skin w/o redness or bruising.    30 minutes were devoted to patient care in this encounter (this includes time spent reviewing the patient's file/history, interviewing and examining the patient, counseling/reviewing plan with patient).

## 2021-02-01 DIAGNOSIS — M79661 Pain in right lower leg: Secondary | ICD-10-CM | POA: Insufficient documentation

## 2021-02-01 NOTE — Assessment & Plan Note (Signed)
Ultrasound negative.  Patient aware.  This could be a calf strain. D/w pt about results and icing, stretching, heel lift and compression if needed.  She will update me as needed.

## 2021-02-01 NOTE — Telephone Encounter (Signed)
Noted.  Had previously discussed with patient.  Symptoms could be attributed to a calf strain.  See result note for management.  Thanks.

## 2021-02-06 ENCOUNTER — Encounter: Payer: Self-pay | Admitting: Oncology

## 2021-02-06 NOTE — Progress Notes (Signed)
Great! Thanks.   Autumn [name]

## 2021-02-14 NOTE — Telephone Encounter (Signed)
Hey Dr. Tasia Catchings,  Given her hypercoagulable work-up was negative and her blood clot was thought to be due to COVID-19  + Oral contraceptives. She had a question about trying to get pregnant in the near future and wanted to make sure she didn't need to take extra precaution.  She also had some balance issues prompting an MRI of her brain which showed several small infarcts age-indeterminate.  She is currently on 81 mg aspirin.  Not on anticoagulation at this time.  Faythe Casa, NP 02/14/2021 11:30 AM

## 2021-02-20 ENCOUNTER — Other Ambulatory Visit: Payer: Self-pay

## 2021-02-21 ENCOUNTER — Other Ambulatory Visit: Payer: Self-pay | Admitting: Neurology

## 2021-03-01 ENCOUNTER — Telehealth: Payer: Self-pay

## 2021-03-01 ENCOUNTER — Telehealth (INDEPENDENT_AMBULATORY_CARE_PROVIDER_SITE_OTHER): Payer: 59 | Admitting: Psychiatry

## 2021-03-01 ENCOUNTER — Encounter: Payer: Self-pay | Admitting: Psychiatry

## 2021-03-01 ENCOUNTER — Other Ambulatory Visit: Payer: Self-pay

## 2021-03-01 DIAGNOSIS — F330 Major depressive disorder, recurrent, mild: Secondary | ICD-10-CM | POA: Diagnosis not present

## 2021-03-01 DIAGNOSIS — F418 Other specified anxiety disorders: Secondary | ICD-10-CM

## 2021-03-01 MED ORDER — HYDROXYZINE HCL 25 MG PO TABS
25.0000 mg | ORAL_TABLET | Freq: Two times a day (BID) | ORAL | 0 refills | Status: DC | PRN
  Filled 2021-03-01: qty 60, 30d supply, fill #0

## 2021-03-01 MED ORDER — BUSPIRONE HCL 10 MG PO TABS
10.0000 mg | ORAL_TABLET | Freq: Two times a day (BID) | ORAL | 1 refills | Status: DC
Start: 2021-03-01 — End: 2021-04-11
  Filled 2021-03-01: qty 60, 30d supply, fill #0
  Filled 2021-04-07: qty 60, 30d supply, fill #1

## 2021-03-01 NOTE — Telephone Encounter (Signed)
Pls schedule to be seen today afternoon

## 2021-03-01 NOTE — Telephone Encounter (Signed)
pt states she not doing well that 2-3 weeks crying , sleeping more, trouble with irritablity, chest pain/ anxiety. feeling out of her skin, affect her work and her routine life.

## 2021-03-01 NOTE — Progress Notes (Signed)
Virtual Visit via Video Note  I connected with Autumn [name] on 03/01/21 at  3:00 PM EDT by a video enabled telemedicine application and verified that I am speaking with the correct person using two identifiers.  Location Provider Location : [facility] Patient Location : Work  Participants: Patient , Provider   I discussed the limitations of evaluation and management by telemedicine and the availability of in person appointments. The patient expressed understanding and agreed to proceed.    I discussed the assessment and treatment plan with the patient. The patient was provided an opportunity to ask questions and all were answered. The patient agreed with the plan and demonstrated an understanding of the instructions.   The patient was advised to call back or seek an in-person evaluation if the symptoms worsen or if the condition fails to improve as anticipated.   [place] MD OP Progress Note  03/01/2021 3:30 PM Autumn [name]  MRN:  [id number]  Chief Complaint:  Chief Complaint   Follow-up; Anxiety; Depression    HPI: KALIYA [name] is a 35 year old Caucasian female who has a history of MDD, anxiety disorder, prediabetic, history of tachycardia, history of bilateral pulmonary embolism on anticoagulation was evaluated by telemedicine today.  Patient had contacted the office reporting worsening mood symptoms and this appointment was scheduled.  Patient reports there has been some changes at work recently that brought back memories of everything that she went through in the past 1 year.  Patient reports she  has been extremely anxious, restless, irritable and has trouble concentrating.  She also reports feeling down, depressed.  She also reports overeating to cope with her stress.  Patient denies any suicidality.  Denies any homicidality.  She is currently awaiting an endocrinology appointment for management of her enlarged lymph nodes and thyromegaly.  Patient reports she  does have a therapist and agrees to get in touch with her to have more therapy sessions.  She reports she is compliant on the Lexapro.  She currently takes a 20 mg.  Denies side effects.  Patient was prescribed BuSpar in the past by her primary care provider however she did not take it too long and hence does not know if she had any side effects or not.  She is willing to give it another chance.  She reports she borrowed hydroxyzine 10 mg from a friend yesterday when she felt extremely anxious.  It did make her drowsy for a few minutes however that did not last.  She however does not believe the 10 mg was beneficial.  Patient denies any other concerns today.  Visit Diagnosis:    ICD-10-CM   1. MDD (major depressive disorder), recurrent episode, mild (H[facility])  F33.0 busPIRone (BUSPAR) 10 MG tablet    2. Other specified anxiety disorders  F41.8 busPIRone (BUSPAR) 10 MG tablet    hydrOXYzine (ATARAX/VISTARIL) 25 MG tablet   Limited symptom attacks      Past Psychiatric History: Reviewed past psychiatric history from progress note on 02/10/2020.  Past trials of Lexapro, Wellbutrin-noncompliant  Past Medical History:  Past Medical History:  Diagnosis Date   Anxiety    Back pain    chronic   Bilateral pulmonary embolism ([place])    a. 06/2020 following COVID infection.   COVID-19 virus infection 06/2020   Depression    Disc disease, degenerative, lumbar or lumbosacral    History of echocardiogram    a. 06/2020 Echo: EF 55-60%, no rwma, nl RV size/fxn.   History of ovarian  cyst    Migraine    Palpitations    a. 2/022 Zio: RSR, 85 (47-112). Rare PACs/PVCs. No significant arrhythmias/prolonged pauses.   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    Past Surgical History:  Procedure Laterality Date   APPENDECTOMY     BREAST SURGERY Right 2008   lump/ benign   TEE WITHOUT CARDIOVERSION N/A 12/13/2020    Procedure: TRANSESOPHAGEAL ECHOCARDIOGRAM (TEE);  Surgeon: Nelva Bush, MD;  Location: [facility] ORS;  Service: Cardiovascular;  Laterality: N/A;    Family Psychiatric History: Reviewed family psychiatric history from progress note on 02/10/2020  Family History:  Family History  Problem Relation Age of Onset   Cancer Mother 49       breast   Breast cancer Mother 43       and 89   Alcohol abuse Father    Vision loss Maternal Grandmother    Heart failure Maternal Grandmother    Cancer Maternal Grandfather    Alcohol abuse Paternal Grandfather    Arthritis Maternal Aunt    Anxiety disorder Maternal Aunt    Depression Maternal Aunt     Social History: Reviewed social history from progress note on 02/10/2020 Social History   Socioeconomic History   Marital status: Married    Spouse name: Not on file   Number of children: 1   Years of education: Not on file   Highest education level: Not on file  Occupational History   Occupation: Transport planner  Tobacco Use   Smoking status: Former    Years: 10.00    Types: Cigarettes    Quit date: 08/18/2016    Years since quitting: 4.5   Smokeless tobacco: Never   Tobacco comments:    3 cigarettes a day  Vaping Use   Vaping Use: Never used  Substance and Sexual Activity   Alcohol use: Yes    Alcohol/week: 0.0 standard drinks    Comment: occasional   Drug use: No   Sexual activity: Yes    Birth control/protection: Pill  Other Topics Concern   Not on file  Social History Narrative   Right handed   Social Determinants of Health   Financial Resource Strain: Not on file  Food Insecurity: Not on file  Transportation Needs: Not on file  Physical Activity: Not on file  Stress: Not on file  Social Connections: Not on file    Allergies:  Allergies  Allergen Reactions   Imitrex [Sumatriptan] Other (See Comments)    Vomiting, Slurred Speech and Facial Numbness   Iodinated Diagnostic Agents Itching and Cough    Patient experienced  sneezing, throat itching, and "feeling like needing to cough" Given 25 mg benadryl and observed x 20 mins/MMS Per dr Polly Cobia patient should have 13 hour prep   Other Itching    States reaction to contrast dye used for CTA of her brain - itching of tongue, tingling lower lip, coughing    Metabolic Disorder Labs: Lab Results  Component Value Date   HGBA1C 6.1 12/08/2020   No results found for: PROLACTIN Lab Results  Component Value Date   CHOL 146 12/08/2020   TRIG 94.0 12/08/2020   HDL 50.80 12/08/2020   CHOLHDL 3 12/08/2020   VLDL 18.8 12/08/2020   LDLCALC 77 12/08/2020   LDLCALC 115 (H) 06/28/2020   Lab Results  Component Value Date   TSH 2.03 12/30/2020  TSH 1.029 07/08/2020    Therapeutic Level Labs: No results found for: LITHIUM No results found for: VALPROATE No components found for:  CBMZ  Current Medications: Current Outpatient Medications  Medication Sig Dispense Refill   busPIRone (BUSPAR) 10 MG tablet Take 1 tablet (10 mg total) by mouth 2 (two) times daily. 60 tablet 1   hydrOXYzine (ATARAX/VISTARIL) 25 MG tablet Take 1 tablet (25 mg total) by mouth 2 (two) times daily as needed for anxiety. For severe anxiety attacks 60 tablet 0   acetaminophen (TYLENOL) 500 MG tablet Take 1,000 mg by mouth every 6 (six) hours as needed (headaches/migraines/pain).     aspirin 81 MG EC tablet Take by mouth.     escitalopram (LEXAPRO) 20 MG tablet Take 1 tablet (20 mg total) by mouth daily. 90 tablet 0   valACYclovir (VALTREX) 1000 MG tablet Take 2 g by mouth 2 (two) times daily as needed (fever blisters).     No current facility-administered medications for this visit.     Musculoskeletal: Strength & Muscle Tone:  UTA Gait & Station: normal Patient leans: N/A  Psychiatric Specialty Exam: Review of Systems  Psychiatric/Behavioral:  The patient is nervous/anxious.   All other systems reviewed and are negative.  There were no vitals taken for this visit.There is no  height or weight on file to calculate BMI.  General Appearance: Casual  Eye Contact:  Fair  Speech:  Clear and Coherent  Volume:  Normal  Mood:  Anxious  Affect:  Congruent  Thought Process:  Goal Directed and Descriptions of Associations: Intact  Orientation:  Full (Time, Place, and Person)  Thought Content: Logical   Suicidal Thoughts:  No  Homicidal Thoughts:  No  Memory:  Immediate;   Fair Recent;   Fair Remote;   Fair  Judgement:  Fair  Insight:  Fair  Psychomotor Activity:  Normal  Concentration:  Concentration: Fair and Attention Span: Fair  Recall:  [employer] of Knowledge: Fair  Language: Fair  Akathisia:  No  Handed:  Right  AIMS (if indicated): done  Assets:  Communication Skills Desire for Improvement Housing Social Support  ADL's:  Intact  Cognition: WNL  Sleep:  Fair   Screenings: GAD-7    Flowsheet Row Video Visit from 03/01/2021 in [place] Video Visit from 01/11/2021 in [place] Video Visit from 10/12/2020 in [place] from 02/10/2020 in [place] from 10/28/2017 in [place]  Total GAD-7 Score 10 2 5 3 4       PHQ2-9    [place] Video Visit from 03/01/2021 in [place] Video Visit from 01/11/2021 in [place] Video Visit from 10/12/2020 in [place] from 02/10/2020 in [place] from 11/12/2018 in [place]  PHQ-2 Total Score 2 1 2 1  0  PHQ-9 Total Score 10 8 8  -- --      Flowsheet Row Video Visit from 01/11/2021 in [place] Video Visit from 10/12/2020 in [place] No Risk No Risk        Assessment and Plan: YESSIKA OTTE is a  35 year old Caucasian female, employed, married, lives in [place], has a history of depression, anxiety was evaluated by telemedicine today.  Patient is currently struggling with worsening mood symptoms, anxiety, depression and will benefit from the following plan.  Plan  MDD-unstable Lexapro 20 mg p.o. daily Add BuSpar 10 mg p.o. twice daily Provided medication education. Advised patient to have psychotherapy sessions, she will reach out to her therapist Ms. Kristeen Mans  Anxiety disorder-unstable Add hydroxyzine 25 mg p.o. twice daily as needed Lexapro as prescribed Add BuSpar 10 mg p.o. twice daily Continue CBT   Provided medication education.  We will coordinate care with endocrinologist.  Patient to sign a release.  Follow-up in clinic in 2 weeks or sooner if needed.  This note was generated in part or whole with voice recognition software. Voice recognition is usually quite accurate but there are transcription errors that can and very often do occur. I apologize for any typographical errors that were not detected and corrected.    Ursula Alert, MD 03/02/2021, 9:28 AM

## 2021-03-13 ENCOUNTER — Encounter: Payer: Self-pay | Admitting: Psychiatry

## 2021-03-13 ENCOUNTER — Other Ambulatory Visit: Payer: Self-pay

## 2021-03-13 ENCOUNTER — Telehealth (INDEPENDENT_AMBULATORY_CARE_PROVIDER_SITE_OTHER): Payer: 59 | Admitting: Psychiatry

## 2021-03-13 DIAGNOSIS — F3341 Major depressive disorder, recurrent, in partial remission: Secondary | ICD-10-CM | POA: Diagnosis not present

## 2021-03-13 DIAGNOSIS — F330 Major depressive disorder, recurrent, mild: Secondary | ICD-10-CM | POA: Insufficient documentation

## 2021-03-13 DIAGNOSIS — F418 Other specified anxiety disorders: Secondary | ICD-10-CM

## 2021-03-13 NOTE — Progress Notes (Signed)
Virtual Visit via Video [name]  I connected with Autumn [name] on 03/13/21 at  8:30 AM EDT by a video enabled telemedicine application and verified that I am speaking with the correct person using two identifiers.  Location Provider Location : [facility] Patient Location : Work  Participants: Patient , Provider    I discussed the limitations of evaluation and management by telemedicine and the availability of in person appointments. The patient expressed understanding and agreed to proceed.    I discussed the assessment and treatment plan with the patient. The patient was provided an opportunity to ask questions and all were answered. The patient agreed with the plan and demonstrated an understanding of the instructions.   The patient was advised to call back or seek an in-person evaluation if the symptoms worsen or if the condition fails to improve as anticipated.    Autumn [name]  03/13/2021 8:56 AM Autumn [name]  MRN:  [id number]  Chief Complaint:  Chief Complaint   Follow-up; Anxiety; Depression    HPI: Autumn [name] is a 35 year old Caucasian female who has a history of MDD, anxiety disorder, prediabetes, history of tachycardia, history of bilateral pulmonary embolism on anticoagulation was evaluated by telemedicine today.  Patient reports she has noticed possible side effects to BuSpar, mental fog as well as grogginess or drowsiness.  She however continues to take it.  Anxiety and depression symptoms are improving.  She does not feel on edge as she used to before.  She does not feel as sad as she used to before and reports she is coping better.  She is sleeping good.  Reports she continues to have an increased appetite at times.  Patient denies any suicidality, homicidality or perceptual disturbances.  Patient reports her therapist left the practice and she is trying to figure out if she could still continue with her therapist at her new practice or  not.  Patient does have upcoming appointment with the endocrinologist in October.  Patient denies any other concerns today.  Visit Diagnosis:    ICD-10-CM   1. MDD (major depressive disorder), recurrent, in partial remission ([place])  F33.41     2. Other specified anxiety disorders  F41.8    limited symptom attack      Past Psychiatric History: I have reviewed past psychiatric history from [name] on 02/10/2020.  Past trials of Lexapro, Wellbutrin-noncompliant  Past Medical History:  Past Medical History:  Diagnosis Date   Anxiety    Back pain    chronic   Bilateral pulmonary embolism ([place])    a. 06/2020 following COVID infection.   COVID-19 virus infection 06/2020   Depression    Disc disease, degenerative, lumbar or lumbosacral    History of echocardiogram    a. 06/2020 Echo: EF 55-60%, no rwma, nl RV size/fxn.   History of ovarian cyst    Migraine    Palpitations    a. 2/022 Zio: RSR, 85 (47-112). Rare PACs/PVCs. No significant arrhythmias/prolonged pauses.   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    Past Surgical History:  Procedure Laterality Date   APPENDECTOMY     BREAST SURGERY Right 2008   lump/ benign   TEE WITHOUT CARDIOVERSION N/A 12/13/2020   Procedure: TRANSESOPHAGEAL ECHOCARDIOGRAM (TEE);  Surgeon: Nelva Bush, MD;  Location: [facility] ORS;  Service: Cardiovascular;  Laterality: N/A;    Family  Psychiatric History: Reviewed family psychiatric history from [name] on 02/10/2020  Family History:  Family History  Problem Relation Age of Onset   Cancer Mother 46       breast   Breast cancer Mother 87       and 18   Alcohol abuse Father    Vision loss Maternal Grandmother    Heart failure Maternal Grandmother    Cancer Maternal Grandfather    Alcohol abuse Paternal Grandfather    Arthritis Maternal Aunt    Anxiety disorder Maternal Aunt    Depression Maternal  Aunt     Social History: Reviewed social history from [name] on 02/10/2020 Social History   Socioeconomic History   Marital status: Married    Spouse name: Not on file   Number of children: 1   Years of education: Not on file   Highest education level: Not on file  Occupational History   Occupation: Transport planner  Tobacco Use   Smoking status: Former    Years: 10.00    Types: Cigarettes    Quit date: 08/18/2016    Years since quitting: 4.5   Smokeless tobacco: Never   Tobacco comments:    3 cigarettes a day  Vaping Use   Vaping Use: Never used  Substance and Sexual Activity   Alcohol use: Yes    Alcohol/week: 0.0 standard drinks    Comment: occasional   Drug use: No   Sexual activity: Yes    Birth control/protection: Pill  Other Topics Concern   Not on file  Social History Narrative   Right handed   Social Determinants of Health   Financial Resource Strain: Not on file  Food Insecurity: Not on file  Transportation Needs: Not on file  Physical Activity: Not on file  Stress: Not on file  Social Connections: Not on file    Allergies:  Allergies  Allergen Reactions   Imitrex [Sumatriptan] Other (See Comments)    Vomiting, Slurred Speech and Facial Numbness   Iodinated Diagnostic Agents Itching and Cough    Patient experienced sneezing, throat itching, and "feeling like needing to cough" Given 25 mg benadryl and observed x 20 mins/MMS Per dr Polly Cobia patient should have 13 hour prep   Other Itching    States reaction to contrast dye used for CTA of her brain - itching of tongue, tingling lower lip, coughing    Metabolic Disorder Labs: Lab Results  Component Value Date   HGBA1C 6.1 12/08/2020   No results found for: PROLACTIN Lab Results  Component Value Date   CHOL 146 12/08/2020   TRIG 94.0 12/08/2020   HDL 50.80 12/08/2020   CHOLHDL 3 12/08/2020   VLDL 18.8 12/08/2020   LDLCALC 77 12/08/2020   LDLCALC 115 (H) 06/28/2020   Lab Results  Component  Value Date   TSH 2.03 12/30/2020   TSH 1.029 07/08/2020    Therapeutic Level Labs: No results found for: LITHIUM No results found for: VALPROATE No components found for:  CBMZ  Current Medications: Current Outpatient Medications  Medication Sig Dispense Refill   acetaminophen (TYLENOL) 500 MG tablet Take 1,000 mg by mouth every 6 (six) hours as needed (headaches/migraines/pain).     aspirin 81 MG EC tablet Take by mouth.     busPIRone (BUSPAR) 10 MG tablet Take 1 tablet (10 mg total) by mouth 2 (two) times daily. 60 tablet 1   escitalopram (LEXAPRO) 20 MG tablet Take 1 tablet (20 mg total) by mouth daily. 90 tablet  0   hydrOXYzine (ATARAX/VISTARIL) 25 MG tablet Take 1 tablet (25 mg total) by mouth 2 (two) times daily as needed for anxiety. For severe anxiety attacks 60 tablet 0   valACYclovir (VALTREX) 1000 MG tablet Take 2 g by mouth 2 (two) times daily as needed (fever blisters).     No current facility-administered medications for this visit.     Musculoskeletal: Strength & Muscle Tone:  UTA Gait & Station:  Seated Patient leans: N/A  Psychiatric Specialty Exam: Review of Systems  Psychiatric/Behavioral:  Positive for dysphoric mood. The patient is nervous/anxious.   All other systems reviewed and are negative.  There were no vitals taken for this visit.There is no height or weight on file to calculate BMI.  General Appearance: Casual  Eye Contact:  Fair  Speech:  Normal Rate  Volume:  Normal  Mood:  Anxious and Depressed improving  Affect:  Congruent  Thought Process:  Goal Directed and Descriptions of Associations: Intact  Orientation:  Full (Time, Place, and Person)  Thought Content: Logical   Suicidal Thoughts:  No  Homicidal Thoughts:  No  Memory:  Immediate;   Fair Recent;   Fair Remote;   Fair  Judgement:  Intact  Insight:  Fair  Psychomotor Activity:  Normal  Concentration:  Concentration: Fair and Attention Span: Fair  Recall:  [employer] of Knowledge:  Fair  Language: Fair  Akathisia:  No  Handed:  Right  AIMS (if indicated): done  Assets:  Communication Skills Desire for Improvement Housing Social Support  ADL's:  Intact  Cognition: WNL  Sleep:  Fair   Screenings: GAD-7    Flowsheet Row Video Visit from 03/13/2021 in [place] Video Visit from 03/01/2021 in [place] Video Visit from 01/11/2021 in [place] Video Visit from 10/12/2020 in [place] from 02/10/2020 in [place]  Total GAD-7 Score 3 10 2 5 3       PHQ2-9    Flowsheet Row Video Visit from 03/13/2021 in [place] Video Visit from 03/01/2021 in [place] Video Visit from 01/11/2021 in [place] Video Visit from 10/12/2020 in [place] from 02/10/2020 in [place]  PHQ-2 Total Score 1 2 1 2 1   PHQ-9 Total Score 5 10 8 8  --      Flowsheet Row Video Visit from 01/11/2021 in [place] Video Visit from 10/12/2020 in [place] No Risk No Risk        Assessment and Plan: Autumn [name] is a 35 year old Caucasian female, employed, married, lives in [place], has a history of depression, anxiety was evaluated by telemedicine today.  Patient is currently improving however does have possible side effects to BuSpar.  Discussed plan as noted below.  Plan MDD partial remission Lexapro 20 mg p.o. daily Advised patient to either take BuSpar 20 mg with supper or she could reduce to BuSpar 10 mg and take it at supper. Continue psychotherapy sessions.  Patient advised to establish care with a therapist.  Anxiety disorder-improving Hydroxyzine 25 mg p.o. twice daily as  needed Lexapro 20 mg p.o. daily BuSpar was prescribed Continue CBT  We will coordinate care with her endocrinologist, patient to sign a release.  Follow-up in clinic in 4 weeks or sooner if needed.  This [name] was generated in part or whole with voice recognition software.  Voice recognition is usually quite accurate but there are transcription errors that can and very often do occur. I apologize for any typographical errors that were not detected and corrected.       Ursula Alert, MD 03/13/2021, 8:56 AM

## 2021-03-17 ENCOUNTER — Other Ambulatory Visit: Payer: Self-pay

## 2021-03-17 ENCOUNTER — Encounter: Payer: Self-pay | Admitting: Family Medicine

## 2021-03-17 ENCOUNTER — Ambulatory Visit: Payer: 59 | Admitting: Family Medicine

## 2021-03-17 VITALS — BP 122/80 | HR 89 | Temp 97.7°F | Ht 70.0 in | Wt 248.4 lb

## 2021-03-17 DIAGNOSIS — Z8673 Personal history of transient ischemic attack (TIA), and cerebral infarction without residual deficits: Secondary | ICD-10-CM

## 2021-03-17 DIAGNOSIS — Z86711 Personal history of pulmonary embolism: Secondary | ICD-10-CM

## 2021-03-17 DIAGNOSIS — M5432 Sciatica, left side: Secondary | ICD-10-CM | POA: Insufficient documentation

## 2021-03-17 MED ORDER — DEXAMETHASONE SODIUM PHOSPHATE 10 MG/ML IJ SOLN
10.0000 mg | Freq: Once | INTRAMUSCULAR | Status: AC
  Administered 2021-03-17: 10 mg via INTRAMUSCULAR

## 2021-03-17 MED ORDER — CYCLOBENZAPRINE HCL 10 MG PO TABS
10.0000 mg | ORAL_TABLET | Freq: Two times a day (BID) | ORAL | 0 refills | Status: DC | PRN
  Filled 2021-03-17: qty 30, 15d supply, fill #0

## 2021-03-17 NOTE — Assessment & Plan Note (Signed)
Story/exam consistent with L sided sciatica due to L piriformis syndrome.  Rx dexamethasone 10mg  IM today and flexeril muscle relaxant to use PRN.  Provided with stretching exercises from [facility] pt advisor on piriformis syndrome.  (Avoiding NSAIDs in CVA hx).  Update if not improving with treatment. Pt agrees with plan.

## 2021-03-17 NOTE — Assessment & Plan Note (Signed)
Presumed COVID related

## 2021-03-17 NOTE — Assessment & Plan Note (Addendum)
Completed 6 months of anticoagulation Now on aspirin 81mg  daily.

## 2021-03-17 NOTE — Patient Instructions (Signed)
I think you have sciatica from piriformis syndrome.  Treat with dexamethasone shot today then use flexeril muscle relaxant as needed.  Use ice/heat as needed Do stretching exercises provided today.  Let us know if not improving with this.

## 2021-03-17 NOTE — Progress Notes (Signed)
Patient ID: Autumn [name], female    DOB: Oct 15, 1985, 35 y.o.   MRN: [id number]  This visit was conducted in person.  BP 122/80   Pulse 89   Temp 97.7 F (36.5 C) (Temporal)   Ht 5\' 10"  (1.778 m)   Wt 248 lb 6 oz (112.7 kg)   LMP 02/25/2021   SpO2 97%   BMI 35.64 kg/m    CC: L sciatica  Subjective:   HPI: Autumn [name] is a 35 y.o. female presenting on 03/17/2021 for Sciatica (C/o left sciatic pain down to foot. Started today.  Has had tension/pressure in low back. Also, c/o spasms. )   Today noted sudden onset shooting pain from L lower back down posterior leg to ankle, as well as ongoing throbbing pain of lower back radiation down buttock into posterior thigh into knee. Hasn't tried anything for this yet. Sitting/standing worse - better with walking.   No fevers/chills, numbness/weakness of leg, saddle anesthesia, or bowel/bladder accidents   Recently noted increased lower back pain/spasm.  Known lumbar DDD for years.   Last night was dancing with heels during a fundraiser dinner.  Hasn't had adjustment by Dr Charlann Boxer since her PE.   Remote lacunar infarcts presumed post COVID also complicated by PE.  She had normal TEE with negative bubble study.      Relevant past medical, surgical, family and social history reviewed and updated as indicated. Interim medical history since our last visit reviewed. Allergies and medications reviewed and updated. Outpatient Medications Prior to Visit  Medication Sig Dispense Refill   acetaminophen (TYLENOL) 500 MG tablet Take 1,000 mg by mouth every 6 (six) hours as needed (headaches/migraines/pain).     aspirin 81 MG EC tablet Take by mouth.     busPIRone (BUSPAR) 10 MG tablet Take 1 tablet (10 mg total) by mouth 2 (two) times daily. 60 tablet 1   escitalopram (LEXAPRO) 20 MG tablet Take 1 tablet (20 mg total) by mouth daily. 90 tablet 0   hydrOXYzine (ATARAX/VISTARIL) 25 MG tablet Take 1 tablet (25 mg total) by mouth 2  (two) times daily as needed for anxiety. For severe anxiety attacks 60 tablet 0   valACYclovir (VALTREX) 1000 MG tablet Take 2 g by mouth 2 (two) times daily as needed (fever blisters).     No facility-administered medications prior to visit.     Per HPI unless specifically indicated in ROS section below Review of Systems  Objective:  BP 122/80   Pulse 89   Temp 97.7 F (36.5 C) (Temporal)   Ht 5\' 10"  (1.778 m)   Wt 248 lb 6 oz (112.7 kg)   LMP 02/25/2021   SpO2 97%   BMI 35.64 kg/m   Wt Readings from Last 3 Encounters:  03/17/21 248 lb 6 oz (112.7 kg)  01/31/21 245 lb 6.4 oz (111.3 kg)  01/06/21 248 lb (112.5 kg)      Physical Exam Vitals and nursing note reviewed.  Constitutional:      Appearance: Normal appearance. She is not ill-appearing.  Musculoskeletal:        General: Normal range of motion.     Right lower leg: No edema.     Left lower leg: No edema.     Comments:  No pain midline spine No paraspinous mm tenderness Neg SLR bilaterally. No pain with int/ext rotation at hip. Neg FABER. No pain at SIJ, GTB bilaterally.  ++ pain with palpation of L sciatic notch  Skin:    General: Skin is warm and dry.     Findings: No rash.  Neurological:     Mental Status: She is alert.     Sensory: Sensation is intact.     Motor: Motor function is intact.     Deep Tendon Reflexes:     Reflex Scores:      Patellar reflexes are 2+ on the right side and 2+ on the left side.      Achilles reflexes are 1+ on the right side and 1+ on the left side.    Comments:  5/5 strength BLE Sensation intact Antalgic gait  Psychiatric:        Mood and Affect: Mood normal.        Behavior: Behavior normal.      Results for orders placed or performed in visit on 01/03/21  Protein S activity  Result Value Ref Range   Protein S Activity 113 63 - 140 %    Assessment & Plan:  This visit occurred during the SARS-CoV-2 public health emergency.  Safety protocols were in place,  including screening questions prior to the visit, additional usage of staff PPE, and extensive cleaning of exam room while observing appropriate contact time as indicated for disinfecting solutions.   Problem List Items Addressed This Visit     History of cryptogenic stroke    Presumed COVID related      History of pulmonary embolism    Completed 6 months of anticoagulation Now on aspirin 81mg  daily.       Left sided sciatica - Primary    Story/exam consistent with L sided sciatica due to L piriformis syndrome.  Rx dexamethasone 10mg  IM today and flexeril muscle relaxant to use PRN.  Provided with stretching exercises from [facility] pt advisor on piriformis syndrome.  (Avoiding NSAIDs in CVA hx).  Update if not improving with treatment. Pt agrees with plan.       Relevant Medications   cyclobenzaprine (FLEXERIL) 10 MG tablet   dexamethasone (DECADRON) injection 10 mg (Start on 03/17/2021  4:45 PM)     Meds ordered this encounter  Medications   cyclobenzaprine (FLEXERIL) 10 MG tablet    Sig: Take 1 tablet (10 mg total) by mouth 2 (two) times daily as needed for muscle spasms (sedation precautions).    Dispense:  30 tablet    Refill:  0   dexamethasone (DECADRON) injection 10 mg   No orders of the defined types were placed in this encounter.    Patient Instructions  I think you have sciatica from piriformis syndrome.  Treat with dexamethasone shot today then use flexeril muscle relaxant as needed.  Use ice/heat as needed Do stretching exercises provided today.  Let us know if not improving with this.   Follow up plan: No follow-ups on file.  Ria Bush, MD

## 2021-03-27 DIAGNOSIS — E049 Nontoxic goiter, unspecified: Secondary | ICD-10-CM | POA: Diagnosis not present

## 2021-04-03 ENCOUNTER — Other Ambulatory Visit: Payer: Self-pay

## 2021-04-03 ENCOUNTER — Other Ambulatory Visit: Payer: Self-pay | Admitting: Primary Care

## 2021-04-03 DIAGNOSIS — B029 Zoster without complications: Secondary | ICD-10-CM

## 2021-04-03 MED ORDER — VALACYCLOVIR HCL 1 G PO TABS
1000.0000 mg | ORAL_TABLET | Freq: Three times a day (TID) | ORAL | 0 refills | Status: AC
  Filled 2021-04-03: qty 21, 7d supply, fill #0

## 2021-04-05 DIAGNOSIS — R590 Localized enlarged lymph nodes: Secondary | ICD-10-CM

## 2021-04-07 ENCOUNTER — Other Ambulatory Visit: Payer: Self-pay

## 2021-04-11 ENCOUNTER — Ambulatory Visit: Payer: 59 | Admitting: Primary Care

## 2021-04-11 ENCOUNTER — Encounter: Payer: Self-pay | Admitting: Psychiatry

## 2021-04-11 ENCOUNTER — Telehealth (INDEPENDENT_AMBULATORY_CARE_PROVIDER_SITE_OTHER): Payer: 59 | Admitting: Psychiatry

## 2021-04-11 ENCOUNTER — Other Ambulatory Visit: Payer: Self-pay

## 2021-04-11 VITALS — BP 110/80 | HR 72 | Temp 97.0°F | Ht 70.0 in | Wt 246.0 lb

## 2021-04-11 DIAGNOSIS — F3341 Major depressive disorder, recurrent, in partial remission: Secondary | ICD-10-CM | POA: Insufficient documentation

## 2021-04-11 DIAGNOSIS — R300 Dysuria: Secondary | ICD-10-CM

## 2021-04-11 DIAGNOSIS — F3342 Major depressive disorder, recurrent, in full remission: Secondary | ICD-10-CM | POA: Diagnosis not present

## 2021-04-11 DIAGNOSIS — F418 Other specified anxiety disorders: Secondary | ICD-10-CM | POA: Diagnosis not present

## 2021-04-11 LAB — POC URINALSYSI DIPSTICK (AUTOMATED)
Bilirubin, UA: NEGATIVE
Blood, UA: NEGATIVE
Glucose, UA: NEGATIVE
Ketones, UA: NEGATIVE
Leukocytes, UA: NEGATIVE
Nitrite, UA: NEGATIVE
Protein, UA: NEGATIVE
Spec Grav, UA: 1.025 (ref 1.010–1.025)
Urobilinogen, UA: 0.2 E.U./dL
pH, UA: 6 (ref 5.0–8.0)

## 2021-04-11 MED ORDER — ESCITALOPRAM OXALATE 20 MG PO TABS
20.0000 mg | ORAL_TABLET | Freq: Every day | ORAL | 0 refills | Status: DC
  Filled 2021-04-11 – 2021-05-30 (×2): qty 90, 90d supply, fill #0

## 2021-04-11 MED ORDER — BUSPIRONE HCL 10 MG PO TABS
10.0000 mg | ORAL_TABLET | ORAL | 1 refills | Status: DC
Start: 2021-04-11 — End: 2021-05-16
  Filled 2021-04-11 (×2): qty 90, 30d supply, fill #0

## 2021-04-11 NOTE — Assessment & Plan Note (Signed)
UA today negative. Culture sent.  No obvious cause seen on vaginal exam.  Wet prep collected and pending.

## 2021-04-11 NOTE — Progress Notes (Signed)
Subjective:    Patient ID: Autumn [name], female    DOB: 05/26/1986, 35 y.o.   MRN: [id number]  HPI  Autumn [name] is a very pleasant 35 y.o. female with a history of migraines, post Covid-19 bilateral pulmonary embolism, lacunar stroke, acute cystitis culture positive who presents today to discuss dysuria.   She also reports vaginal irritation after urination, darker color urine. Symptoms began two weeks ago. She denies changes in frequency and urgency, vaginal discharge, vaginal itching. She drinks plenty of water daily.    Review of Systems  Gastrointestinal:  Negative for abdominal pain.  Genitourinary:  Positive for dysuria. Negative for frequency, hematuria, urgency and vaginal discharge.        Past Medical History:  Diagnosis Date   Anxiety    Back pain    chronic   Bilateral pulmonary embolism ([place])    a. 06/2020 following COVID infection.   COVID-19 virus infection 06/2020   Depression    Disc disease, degenerative, lumbar or lumbosacral    History of echocardiogram    a. 06/2020 Echo: EF 55-60%, no rwma, nl RV size/fxn.   History of ovarian cyst    Migraine    Palpitations    a. 2/022 Zio: RSR, 85 (47-112). Rare PACs/PVCs. No significant arrhythmias/prolonged pauses.   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    Social History   Socioeconomic History   Marital status: Married    Spouse name: Not on file   Number of children: 1   Years of education: Not on file   Highest education level: Not on file  Occupational History   Occupation: Transport planner  Tobacco Use   Smoking status: Former    Years: 10.00    Types: Cigarettes    Quit date: 08/18/2016    Years since quitting: 4.6   Smokeless tobacco: Never   Tobacco comments:    3 cigarettes a day  Vaping Use   Vaping Use: Never used  Substance and Sexual Activity   Alcohol use: Yes    Alcohol/week: 0.0  standard drinks    Comment: occasional   Drug use: No   Sexual activity: Yes    Birth control/protection: Pill  Other Topics Concern   Not on file  Social History Narrative   Right handed   Social Determinants of Health   Financial Resource Strain: Not on file  Food Insecurity: Not on file  Transportation Needs: Not on file  Physical Activity: Not on file  Stress: Not on file  Social Connections: Not on file  Intimate Partner Violence: Not on file    Past Surgical History:  Procedure Laterality Date   APPENDECTOMY     BREAST SURGERY Right 2008   lump/ benign   TEE WITHOUT CARDIOVERSION N/A 12/13/2020   Procedure: TRANSESOPHAGEAL ECHOCARDIOGRAM (TEE);  Surgeon: Nelva Bush, MD;  Location: [facility] ORS;  Service: Cardiovascular;  Laterality: N/A;    Family History  Problem Relation Age of Onset   Cancer Mother 35       breast   Breast cancer Mother 23       and 74   Alcohol abuse Father    Vision loss Maternal Grandmother    Heart failure Maternal Grandmother    Cancer Maternal Grandfather    Alcohol abuse Paternal Grandfather    Arthritis Maternal Aunt    Anxiety disorder Maternal Aunt  Depression Maternal Aunt     Allergies  Allergen Reactions   Imitrex [Sumatriptan] Other (See Comments)    Vomiting, Slurred Speech and Facial Numbness   Iodinated Diagnostic Agents Itching and Cough    Patient experienced sneezing, throat itching, and "feeling like needing to cough" Given 25 mg benadryl and observed x 20 mins/MMS Per dr Polly Cobia patient should have 13 hour prep   Other Itching    States reaction to contrast dye used for CTA of her brain - itching of tongue, tingling lower lip, coughing    Current Outpatient Medications on File Prior to Visit  Medication Sig Dispense Refill   acetaminophen (TYLENOL) 500 MG tablet Take 1,000 mg by mouth every 6 (six) hours as needed (headaches/migraines/pain).     aspirin 81 MG EC tablet Take by mouth.     busPIRone (BUSPAR) 10  MG tablet Take 1 tablet (10 mg total) by mouth 2 (two) times daily. 60 tablet 1   cyanocobalamin 1000 MCG tablet Take 1,000 mcg by mouth daily at 12 noon.     cyclobenzaprine (FLEXERIL) 10 MG tablet Take 1 tablet (10 mg total) by mouth 2 (two) times daily as needed for muscle spasms (sedation precautions). 30 tablet 0   escitalopram (LEXAPRO) 20 MG tablet Take 1 tablet (20 mg total) by mouth daily. 90 tablet 0   hydrOXYzine (ATARAX/VISTARIL) 25 MG tablet Take 1 tablet (25 mg total) by mouth 2 (two) times daily as needed for anxiety. For severe anxiety attacks 60 tablet 0   valACYclovir (VALTREX) 1000 MG tablet Take 2 g by mouth 2 (two) times daily as needed (fever blisters).     No current facility-administered medications on file prior to visit.    BP 110/80   Pulse 72   Temp (!) 97 F (36.1 C) (Temporal)   Ht 5\' 10"  (1.778 m)   Wt 246 lb (111.6 kg)   SpO2 98%   BMI 35.30 kg/m  Objective:   Physical Exam Pulmonary:     Effort: Pulmonary effort is normal.  Genitourinary:    Labia:        Right: No tenderness or lesion.        Left: No tenderness or lesion.      Vagina: Vaginal discharge present.     Cervix: Discharge present.     Comments: Scant amount of whitish vaginal discharge. No foul smell.  Skin:    General: Skin is warm and dry.  Neurological:     Mental Status: She is alert.          Assessment & Plan:      This visit occurred during the SARS-CoV-2 public health emergency.  Safety protocols were in place, including screening questions prior to the visit, additional usage of staff PPE, and extensive cleaning of exam room while observing appropriate contact time as indicated for disinfecting solutions.

## 2021-04-11 NOTE — Progress Notes (Signed)
Virtual Visit via Video Note  I connected with Autumn [name] on 04/11/21 at  2:20 PM EDT by a video enabled telemedicine application and verified that I am speaking with the correct person using two identifiers.  Location Provider Location : [facility] Patient Location : Car  Participants: Patient , Provider    I discussed the limitations of evaluation and management by telemedicine and the availability of in person appointments. The patient expressed understanding and agreed to proceed.   I discussed the assessment and treatment plan with the patient. The patient was provided an opportunity to ask questions and all were answered. The patient agreed with the plan and demonstrated an understanding of the instructions.   The patient was advised to call [name] or seek an in-person evaluation if the symptoms worsen or if the condition fails to improve as anticipated.   [place] MD OP Progress Note  04/11/2021 2:41 PM Autumn [name]  MRN:  [id number]  Chief Complaint:  Chief Complaint   Follow-up; Depression    HPI: Autumn [name] is a 35 year old Caucasian female who has a history of MDD, other specified anxiety disorder, prediabetic, history of tachycardia, history of bilateral pulmonary embolism on anticoagulation was evaluated by telemedicine today.  Patient today reports although her mood symptoms are improving she continues to have anxiety and irritability often.  She does not believe the BuSpar is giving her any more side effects and she currently takes it twice a day.  She wonders whether the BuSpar dosage could be increased to see if that will help.  Denies any significant depression symptoms at this time.  Patient reports sleep is overall okay.  Denies any side effects to medications.  Patient with dysuria, had an appointment with her primary care provider, currently being evaluated for the same.  Denies any suicidality, homicidality or perceptual  disturbances.  Denies any other concerns today.  Visit Diagnosis:    ICD-10-CM   1. MDD (major depressive disorder), recurrent, in full remission (Autumn [name])  F33.42 escitalopram (LEXAPRO) 20 MG tablet    2. Other specified anxiety disorders  F41.8 busPIRone (BUSPAR) 10 MG tablet   Limited symptom attacks      Past Psychiatric History: Reviewed past psychiatric history from progress note on 02/10/2020.  Past trials of Lexapro, Wellbutrin-noncompliant.  Past Medical History:  Past Medical History:  Diagnosis Date   Anxiety    [name] pain    chronic   Bilateral pulmonary embolism ([place])    a. 06/2020 following COVID infection.   COVID-19 virus infection 06/2020   Depression    Disc disease, degenerative, lumbar or lumbosacral    History of echocardiogram    a. 06/2020 Echo: EF 55-60%, no rwma, nl RV size/fxn.   History of ovarian cyst    Migraine    Palpitations    a. 2/022 Zio: RSR, 85 (47-112). Rare PACs/PVCs. No significant arrhythmias/prolonged pauses.   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    Past Surgical History:  Procedure Laterality Date   APPENDECTOMY     BREAST SURGERY Right 2008   lump/ benign   TEE WITHOUT CARDIOVERSION N/A 12/13/2020   Procedure: TRANSESOPHAGEAL ECHOCARDIOGRAM (TEE);  Surgeon: Nelva Bush, MD;  Location: [facility] ORS;  Service: Cardiovascular;  Laterality: N/A;    Family Psychiatric History: Reviewed family psychiatric history from progress note on 02/10/2020  Family History:  Family History  Problem  Relation Age of Onset   Cancer Mother 20       breast   Breast cancer Mother 4       and 51   Alcohol abuse Father    Vision loss Maternal Grandmother    Heart failure Maternal Grandmother    Cancer Maternal Grandfather    Alcohol abuse Paternal Grandfather    Arthritis Maternal Aunt    Anxiety disorder Maternal Aunt    Depression Maternal Aunt     Social  History: Reviewed social history from progress note on 02/10/2020 Social History   Socioeconomic History   Marital status: Married    Spouse name: Not on file   Number of children: 1   Years of education: Not on file   Highest education level: Not on file  Occupational History   Occupation: Transport planner  Tobacco Use   Smoking status: Former    Years: 10.00    Types: Cigarettes    Quit date: 08/18/2016    Years since quitting: 4.6   Smokeless tobacco: Never   Tobacco comments:    3 cigarettes a day  Vaping Use   Vaping Use: Never used  Substance and Sexual Activity   Alcohol use: Yes    Alcohol/week: 0.0 standard drinks    Comment: occasional   Drug use: No   Sexual activity: Yes    Birth control/protection: Pill  Other Topics Concern   Not on file  Social History Narrative   Right handed   Social Determinants of Health   Financial Resource Strain: Not on file  Food Insecurity: Not on file  Transportation Needs: Not on file  Physical Activity: Not on file  Stress: Not on file  Social Connections: Not on file    Allergies:  Allergies  Allergen Reactions   Imitrex [Sumatriptan] Other (See Comments)    Vomiting, Slurred Speech and Facial Numbness   Gadolinium Derivatives Other (See Comments)    Lip tingling, scratchy throat   Iodinated Diagnostic Agents Itching and Cough    Patient experienced sneezing, throat itching, and "feeling like needing to cough" Given 25 mg benadryl and observed x 20 mins/MMS Per dr Polly Cobia patient should have 13 hour prep   Other Itching    States reaction to contrast dye used for CTA of her brain - itching of tongue, tingling lower lip, coughing    Metabolic Disorder Labs: Lab Results  Component Value Date   HGBA1C 6.1 12/08/2020   No results found for: PROLACTIN Lab Results  Component Value Date   CHOL 146 12/08/2020   TRIG 94.0 12/08/2020   HDL 50.80 12/08/2020   CHOLHDL 3 12/08/2020   VLDL 18.8 12/08/2020   LDLCALC 77  12/08/2020   LDLCALC 115 (H) 06/28/2020   Lab Results  Component Value Date   TSH 2.03 12/30/2020   TSH 1.029 07/08/2020    Therapeutic Level Labs: No results found for: LITHIUM No results found for: VALPROATE No components found for:  CBMZ  Current Medications: Current Outpatient Medications  Medication Sig Dispense Refill   acetaminophen (TYLENOL) 500 MG tablet Take 1,000 mg by mouth every 6 (six) hours as needed (headaches/migraines/pain).     aspirin 81 MG EC tablet Take by mouth.     busPIRone (BUSPAR) 10 MG tablet Take 1-2 tablets (10-20 mg total) by mouth as directed. Take 10 mg daily AM and 20 mg daily PM 90 tablet 1   cyanocobalamin 1000 MCG tablet Take 1,000 mcg by mouth daily at 12 noon.  cyclobenzaprine (FLEXERIL) 10 MG tablet Take 1 tablet (10 mg total) by mouth 2 (two) times daily as needed for muscle spasms (sedation precautions). 30 tablet 0   escitalopram (LEXAPRO) 20 MG tablet Take 1 tablet (20 mg total) by mouth daily. 90 tablet 0   hydrOXYzine (ATARAX/VISTARIL) 25 MG tablet Take 1 tablet (25 mg total) by mouth 2 (two) times daily as needed for anxiety. For severe anxiety attacks 60 tablet 0   valACYclovir (VALTREX) 1000 MG tablet Take 2 g by mouth 2 (two) times daily as needed (fever blisters).     No current facility-administered medications for this visit.     Musculoskeletal: Strength & Muscle Tone:  UTA Gait & Station:  Seated Patient leans: N/A  Psychiatric Specialty Exam: Review of Systems  Genitourinary:  Positive for dysuria.  Psychiatric/Behavioral:  The patient is nervous/anxious.   All other systems reviewed and are negative.  There were no vitals taken for this visit.There is no height or weight on file to calculate BMI.  General Appearance: Casual  Eye Contact:  Fair  Speech:  Clear and Coherent  Volume:  Normal  Mood:  Anxious and Irritable  Affect:  Appropriate  Thought Process:  Goal Directed and Descriptions of Associations:  Intact  Orientation:  Full (Time, Place, and Person)  Thought Content: Logical   Suicidal Thoughts:  No  Homicidal Thoughts:  No  Memory:  Immediate;   Fair Recent;   Fair Remote;   Fair  Judgement:  Fair  Insight:  Fair  Psychomotor Activity:  Normal  Concentration:  Concentration: Good and Attention Span: Good  Recall:  [place] of Knowledge: Fair  Language: Fair  Akathisia:  No  Handed:  Right  AIMS (if indicated): done, 0  Assets:  Communication Skills Desire for Improvement Housing Social Support  ADL's:  Intact  Cognition: WNL  Sleep:  Fair   Screenings: GAD-7    Flowsheet Row Video Visit from 04/11/2021 in [place] Video Visit from 03/13/2021 in [place] Video Visit from 03/01/2021 in [place] Video Visit from 01/11/2021 in [place] Video Visit from 10/12/2020 in [place]  Total GAD-7 Score 3 3 10 2 5       PHQ2-9    [place] Video Visit from 04/11/2021 in [place] Video Visit from 03/13/2021 in [place] Video Visit from 03/01/2021 in [place] Video Visit from 01/11/2021 in [place] Video Visit from 10/12/2020 in [place]  PHQ-2 Total Score 2 1 2 1 2   PHQ-9 Total Score 6 5 10 8 8       Flowsheet Row Video Visit from 01/11/2021 in [place] Video Visit from 10/12/2020 in [place] No Risk No Risk        Assessment and Plan: Autumn [name] is a 35 year old Caucasian female, employed, married, lives in [place], has a history of depression, anxiety was evaluated by telemedicine today.  Patient continues to struggle with anxiety, will benefit from the following  plan.  Plan MDD in remission Lexapro 20 mg p.o. daily Continue CBT.  Anxiety disorder-unstable Increase BuSpar to 10 mg p.o. daily and 20 mg every afternoon Hydroxyzine 25 mg p.o. twice daily as needed Lexapro 20 mg p.o. daily Continue CBT  Patient to follow up with primary care provider for dysuria.  Follow-up in clinic in 4  to 5 weeks or sooner in person.  This note was generated in part or whole with voice recognition software. Voice recognition is usually quite accurate but there are transcription errors that can and very often do occur. I apologize for any typographical errors that were not detected and corrected.       Ursula Alert, MD 04/12/2021, 8:50 AM

## 2021-04-11 NOTE — Patient Instructions (Signed)
We will be in touch with your results once received.   It was a pleasure to see you today!

## 2021-04-12 LAB — WET PREP BY MOLECULAR PROBE
Candida species: NOT DETECTED
Gardnerella vaginalis: NOT DETECTED
MICRO NUMBER:: 12579429
SPECIMEN QUALITY:: ADEQUATE
Trichomonas vaginosis: NOT DETECTED

## 2021-04-12 LAB — URINE CULTURE
MICRO NUMBER:: 12578043
SPECIMEN QUALITY:: ADEQUATE

## 2021-04-15 DIAGNOSIS — Z135 Encounter for screening for eye and ear disorders: Secondary | ICD-10-CM | POA: Diagnosis not present

## 2021-04-15 DIAGNOSIS — H5213 Myopia, bilateral: Secondary | ICD-10-CM | POA: Diagnosis not present

## 2021-04-18 ENCOUNTER — Ambulatory Visit: Payer: 59 | Admitting: Psychiatry

## 2021-05-11 ENCOUNTER — Other Ambulatory Visit: Payer: Self-pay

## 2021-05-11 ENCOUNTER — Telehealth: Payer: Self-pay | Admitting: Primary Care

## 2021-05-11 DIAGNOSIS — R21 Rash and other nonspecific skin eruption: Secondary | ICD-10-CM

## 2021-05-11 MED ORDER — PREDNISONE 20 MG PO TABS
ORAL_TABLET | ORAL | 0 refills | Status: DC
  Filled 2021-05-11: qty 12, 8d supply, fill #0

## 2021-05-11 NOTE — Telephone Encounter (Signed)
Evaluated patient today in passing, patient with blisterlike spots to left wrist, nonfluctuant, nontender, no surrounding erythema.  Also with 2 other like spots to right shoulder which have opened with clear fluid and have crusted over.  Another spot to left lateral humeral region that has also opened with clear fluid and crusted over.  Unclear etiology, does not appear infectious. Will treat with systemic steroids as topical steroids have been ineffective.  She will update.

## 2021-05-16 ENCOUNTER — Encounter: Payer: Self-pay | Admitting: Psychiatry

## 2021-05-16 ENCOUNTER — Ambulatory Visit (INDEPENDENT_AMBULATORY_CARE_PROVIDER_SITE_OTHER): Payer: 59 | Admitting: Psychiatry

## 2021-05-16 ENCOUNTER — Other Ambulatory Visit: Payer: Self-pay

## 2021-05-16 VITALS — BP 111/75 | HR 81 | Temp 98.7°F | Wt 246.8 lb

## 2021-05-16 DIAGNOSIS — F418 Other specified anxiety disorders: Secondary | ICD-10-CM | POA: Diagnosis not present

## 2021-05-16 DIAGNOSIS — F3342 Major depressive disorder, recurrent, in full remission: Secondary | ICD-10-CM | POA: Diagnosis not present

## 2021-05-16 MED ORDER — CLONAZEPAM 0.5 MG PO TABS
0.5000 mg | ORAL_TABLET | Freq: Every day | ORAL | 1 refills | Status: DC | PRN
  Filled 2021-05-16: qty 15, 15d supply, fill #0

## 2021-05-16 MED ORDER — BUSPIRONE HCL 15 MG PO TABS
15.0000 mg | ORAL_TABLET | Freq: Three times a day (TID) | ORAL | 1 refills | Status: DC
  Filled 2021-05-16: qty 90, 30d supply, fill #0

## 2021-05-16 NOTE — Progress Notes (Signed)
[facility] MD OP Progress Note  05/16/2021 4:26 PM Autumn [name]  MRN:  [id number]  Chief Complaint:  Chief Complaint   Follow-up; Anxiety; Depression    HPI: Autumn [name] is a 35 year old Caucasian female who has a history of MDD, other specified anxiety disorder, prediabetic, history of tachycardia, history of bilateral pulmonary embolism on anticoagulation was evaluated in office today.  Patient today reports she continues to have irritability often since she has a lot of work-related stressors.  Patient reports she is often anxious about her situational stressors.  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  She also reports she is going through a divorce.  However does report social support system from a girlfriend with whom she lives as well as her mother.  She denies any significant sadness or crying spells.  Reports sleep is overall okay.  She tried taking hydroxyzine when she had an anxiety attack however she reports that did not help much and she felt groggy the next day.  She would like to try something new.  She is compliant on her other medications, tolerating the BuSpar and Lexapro well.  Denies side effects.  Patient denies any suicidality, homicidality or perceptual disturbances.  Patient denies any other concerns today.  Visit Diagnosis:    ICD-10-CM   1. MDD (major depressive disorder), recurrent, in full remission ([place])  F33.42     2. Other specified anxiety disorders  F41.8 busPIRone (BUSPAR) 15 MG tablet    clonazePAM (KLONOPIN) 0.5 MG tablet   with limited symptom attack      Past Psychiatric History: Reviewed past psychiatric history from progress note on 02/10/2020.  Past trials of Lexapro, Wellbutrin-noncompliant  Past Medical History:  Past Medical History:  Diagnosis Date   Anxiety    Back pain    chronic   Bilateral pulmonary embolism ([place])    a. 06/2020 following COVID infection.   COVID-19 virus infection 06/2020   Depression    Disc  disease, degenerative, lumbar or lumbosacral    History of echocardiogram    a. 06/2020 Echo: EF 55-60%, no rwma, nl RV size/fxn.   History of ovarian cyst    Migraine    Palpitations    a. 2/022 Zio: RSR, 85 (47-112). Rare PACs/PVCs. No significant arrhythmias/prolonged pauses.   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  Family History:  Family History  Problem Relation Age of Onset   Cancer Mother 54       breast   Breast cancer Mother 48       and 69   Alcohol abuse Father    Vision loss Maternal Grandmother    Heart failure Maternal Grandmother    Cancer Maternal Grandfather    Alcohol abuse Paternal Grandfather    Arthritis Maternal Aunt    Anxiety disorder Maternal Aunt    Depression Maternal Aunt     Social History: Reviewed social history from progress note from 02/10/2020. Social History   Socioeconomic History  Marital status: Married    Spouse name: Not on file   Number of children: 1   Years of education: Not on file   Highest education level: Not on file  Occupational History   Occupation: Transport planner  Tobacco Use   Smoking status: Former    Years: 10.00    Types: Cigarettes    Quit date: 08/18/2016    Years since quitting: 4.7   Smokeless tobacco: Never   Tobacco comments:    3 cigarettes a day  Vaping Use   Vaping Use: Never used  Substance and Sexual Activity   Alcohol use: Yes    Alcohol/week: 0.0 standard drinks    Comment: occasional   Drug use: No   Sexual  activity: Yes    Birth control/protection: Pill  Other Topics Concern   Not on file  Social History Narrative   Right handed   Social Determinants of Health   Financial Resource Strain: Not on file  Food Insecurity: Not on file  Transportation Needs: Not on file  Physical Activity: Not on file  Stress: Not on file  Social Connections: Not on file    Allergies:  Allergies  Allergen Reactions   Imitrex [Sumatriptan] Other (See Comments)    Vomiting, Slurred Speech and Facial Numbness   Gadolinium Derivatives Other (See Comments)    Lip tingling, scratchy throat   Iodinated Diagnostic Agents Itching and Cough    Patient experienced sneezing, throat itching, and "feeling like needing to cough" Given 25 mg benadryl and observed x 20 mins/MMS Per dr Polly Cobia patient should have 13 hour prep   Other Itching    States reaction to contrast dye used for CTA of her brain - itching of tongue, tingling lower lip, coughing    Metabolic Disorder Labs: Lab Results  Component Value Date   HGBA1C 6.1 12/08/2020   No results found for: PROLACTIN Lab Results  Component Value Date   CHOL 146 12/08/2020   TRIG 94.0 12/08/2020   HDL 50.80 12/08/2020   CHOLHDL 3 12/08/2020   VLDL 18.8 12/08/2020   LDLCALC 77 12/08/2020   LDLCALC 115 (H) 06/28/2020   Lab Results  Component Value Date   TSH 2.03 12/30/2020   TSH 1.029 07/08/2020    Therapeutic Level Labs: No results found for: LITHIUM No results found for: VALPROATE No components found for:  CBMZ  Current Medications: Current Outpatient Medications  Medication Sig Dispense Refill   acetaminophen (TYLENOL) 500 MG tablet Take 1,000 mg by mouth every 6 (six) hours as needed (headaches/migraines/pain).     aspirin 81 MG EC tablet Take by mouth.     busPIRone (BUSPAR) 15 MG tablet Take 1 tablet (15 mg total) by mouth 3 (three) times daily. 90 tablet 1   clonazePAM (KLONOPIN) 0.5 MG tablet Take 1 tablet (0.5 mg total) by mouth daily as  needed for anxiety. For severe panic attacks 15 tablet 1   cyanocobalamin 1000 MCG tablet Take 1,000 mcg by mouth daily at 12 noon.     cyclobenzaprine (FLEXERIL) 10 MG tablet Take 1 tablet (10 mg total) by mouth 2 (two) times daily as needed for muscle spasms (sedation precautions). 30 tablet 0   escitalopram (LEXAPRO) 20 MG tablet Take 1 tablet (20 mg total) by mouth daily. 90 tablet 0   predniSONE (DELTASONE) 20 MG tablet Take two tablets my mouth once daily for four days, then one tablet once daily for four days. 12 tablet 0   valACYclovir (VALTREX) 1000 MG  tablet Take 2 g by mouth 2 (two) times daily as needed (fever blisters).     No current facility-administered medications for this visit.     Musculoskeletal: Strength & Muscle Tone: within normal limits Gait & Station: normal Patient leans: N/A  Psychiatric Specialty Exam: Review of Systems  Psychiatric/Behavioral:  The patient is nervous/anxious.   All other systems reviewed and are negative.  Blood pressure 111/75, pulse 81, temperature 98.7 F (37.1 C), temperature source Temporal, weight 246 lb 12.8 oz (111.9 kg).Body mass index is 35.41 kg/m.  General Appearance: Casual  Eye Contact:  Fair  Speech:  Clear and Coherent  Volume:  Normal  Mood:  Anxious  Affect:  Congruent  Thought Process:  Goal Directed and Descriptions of Associations: Intact  Orientation:  Full (Time, Place, and Person)  Thought Content: Logical   Suicidal Thoughts:  No  Homicidal Thoughts:  No  Memory:  Immediate;   Fair Recent;   Fair Remote;   Fair  Judgement:  Fair  Insight:  Fair  Psychomotor Activity:  Normal  Concentration:  Concentration: Fair and Attention Span: Fair  Recall:  [employer] of Knowledge: Fair  Language: Fair  Akathisia:  No  Handed:  Right  AIMS (if indicated): done, 0  Assets:  Communication Skills Desire for Improvement Housing Social Support  ADL's:  Intact  Cognition: WNL  Sleep:  Fair    Screenings: GAD-7    [place] Office Visit from 05/16/2021 in [place] Video Visit from 04/11/2021 in [place] Video Visit from 03/13/2021 in [place] Video Visit from 03/01/2021 in [place] Video Visit from 01/11/2021 in [place]  Total GAD-7 Score 7 3 3 10 2       PHQ2-9    [place] Office Visit from 05/16/2021 in [place] Video Visit from 04/11/2021 in [place] Video Visit from 03/13/2021 in [place] Video Visit from 03/01/2021 in [place] Video Visit from 01/11/2021 in [place]  PHQ-2 Total Score 2 2 1 2 1   PHQ-9 Total Score 6 6 5 10 8       [place] Office Visit from 05/16/2021 in [place] Video Visit from 01/11/2021 in [place] Video Visit from 10/12/2020 in [place] No Risk No Risk No Risk        Assessment and Plan: AUBREANNA PERCLE is a 35 year old Caucasian female, employed, lives in [place], has a history of depression, anxiety was evaluated in office today.  Patient continues to struggle with anxiety, irritability and will benefit from the following plan.  Plan MDD in remission Lexapro 20 mg p.o. daily Continue CBT  Anxiety disorder-unstable Increase BuSpar to 15 mg p.o. 3 times daily Start Klonopin 0.5 mg p.o. as needed for severe anxiety attacks only.  Patient advised to limit use Provided education about long-term use of benzodiazepines. Discontinue hydroxyzine for side effects.  Patient advised to continue CBT with Ms. Dayle Points.  Follow-up in clinic in 3 to 4 weeks or sooner if needed.  This note was generated in part or  whole with voice recognition software. Voice recognition is usually quite accurate but there are transcription errors that can and very often do occur. I apologize for any typographical errors that were not detected and corrected.       Ursula Alert, MD 05/17/2021,  8:17 AM

## 2021-05-16 NOTE — Patient Instructions (Signed)
Clonazepam Tablets What is this medication? CLONAZEPAM (kloe NA ze pam) treats seizures. It may also be used to treat panic disorder. It works by helping your nervous system calm down. It belongs to a group of medications called benzodiazepines. This medicine may be used for other purposes; ask your health care provider or pharmacist if you have questions. COMMON BRAND NAME(S): Ceberclon, Klonopin What should I tell my care team before I take this medication? They need to know if you have any of these conditions: An alcohol or drug abuse problem Bipolar disorder, depression, psychosis or other mental health condition Glaucoma Kidney or liver disease Lung or breathing disease Myasthenia gravis Parkinson disease Porphyria Seizures or a history of seizures Suicidal thoughts An unusual or allergic reaction to clonazepam, other benzodiazepines, foods, dyes, or preservatives Pregnant or trying to get pregnant Breast-feeding How should I use this medication? Take this medication by mouth with a glass of water. Follow the directions on the prescription label. If it upsets your stomach, take it with food or milk. Take your medication at regular intervals. Do not take it more often than directed. Do not stop taking or change the dose except on the advice of your care team. A special MedGuide will be given to you by the pharmacist with each prescription and refill. Be sure to read this information carefully each time. Talk to your care team regarding the use of this medication in children. Special care may be needed. Overdosage: If you think you have taken too much of this medicine contact a poison control center or emergency room at once. NOTE: This medicine is only for you. Do not share this medicine with others. What if I miss a dose? If you miss a dose, take it as soon as you can. If it is almost time for your next dose, take only that dose. Do not take double or extra doses. What may interact  with this medication? Do not take this medication with any of the following: Narcotic medications for cough Sodium oxybate This medication may also interact with the following: Alcohol Antihistamines for allergy, cough and cold Antiviral medications for HIV or AIDS Certain medications for anxiety or sleep Certain medications for depression, like amitriptyline, fluoxetine, sertraline Certain medications for fungal infections like ketoconazole and itraconazole Certain medications for seizures like carbamazepine, phenobarbital, phenytoin, primidone General anesthetics like halothane, isoflurane, methoxyflurane, propofol Local anesthetics like lidocaine, pramoxine, tetracaine Medications that relax muscles for surgery Narcotic medications for pain Phenothiazines like chlorpromazine, mesoridazine, prochlorperazine, thioridazine This list may not describe all possible interactions. Give your health care provider a list of all the medicines, herbs, non-prescription drugs, or dietary supplements you use. Also tell them if you smoke, drink alcohol, or use illegal drugs. Some items may interact with your medicine. What should I watch for while using this medication? Tell your care team if your symptoms do not start to get better or if they get worse. Do not stop taking except on your care team's advice. You may develop a severe reaction. Your care team will tell you how much medication to take. You may get drowsy or dizzy. Do not drive, use machinery, or do anything that needs mental alertness until you know how this medication affects you. To reduce the risk of dizzy and fainting spells, do not stand or sit up quickly, especially if you are an older patient. Alcohol may increase dizziness and drowsiness. Avoid alcoholic drinks. If you are taking another medication that also causes drowsiness, you may   have more side effects. Give your care team a list of all medications you use. Your care team will tell  you how much medication to take. Do not take more medication than directed. Call emergency services if you have problems breathing or unusual sleepiness. The use of this medication may increase the chance of suicidal thoughts or actions. Pay special attention to how you are responding while on this medication. Any worsening of mood, or thoughts of suicide or dying should be reported to your care team right away. What side effects may I notice from receiving this medication? Side effects that you should report to your care team as soon as possible: Allergic reactions--skin rash, itching, hives, swelling of the face, lips, tongue, or throat CNS depression--slow or shallow breathing, shortness of breath, feeling faint, dizziness, confusion, trouble staying awake Thoughts of suicide or self-harm, worsening mood, feelings of depression Side effects that usually do not require medical attention (report to your care team if they continue or are bothersome): Dizziness Drowsiness Headache This list may not describe all possible side effects. Call your doctor for medical advice about side effects. You may report side effects to FDA at 1-800-FDA-1088. Where should I keep my medication? Keep out of the reach of children and pets. This medication can be abused. Keep your medication in a safe place to protect it from theft. Do not share this medication with anyone. Selling or giving away this medication is dangerous and against the law. Store at room temperature between 15 and 30 degrees C (59 and 86 degrees F). Protect from light. Keep container tightly closed. This medication may cause accidental overdose and death if taken by other adults, children, or pets. Mix any unused medication with a substance like cat litter or coffee grounds. Then throw the medication away in a sealed container like a sealed bag or a coffee can with a lid. Do not use the medication after the expiration date. NOTE: This sheet is a  summary. It may not cover all possible information. If you have questions about this medicine, talk to your doctor, pharmacist, or health care provider.  2022 Elsevier/Gold Standard (2020-06-24 00:00:00)

## 2021-05-25 ENCOUNTER — Encounter: Payer: Self-pay | Admitting: Primary Care

## 2021-05-25 ENCOUNTER — Ambulatory Visit (INDEPENDENT_AMBULATORY_CARE_PROVIDER_SITE_OTHER): Payer: 59 | Admitting: Primary Care

## 2021-05-25 DIAGNOSIS — R42 Dizziness and giddiness: Secondary | ICD-10-CM | POA: Insufficient documentation

## 2021-05-25 HISTORY — DX: Dizziness and giddiness: R42

## 2021-05-25 LAB — CBC
HCT: 43.6 % (ref 36.0–46.0)
Hemoglobin: 14.3 g/dL (ref 12.0–15.0)
MCHC: 32.8 g/dL (ref 30.0–36.0)
MCV: 90 fl (ref 78.0–100.0)
Platelets: 315 10*3/uL (ref 150.0–400.0)
RBC: 4.84 Mil/uL (ref 3.87–5.11)
RDW: 13 % (ref 11.5–15.5)
WBC: 9.4 10*3/uL (ref 4.0–10.5)

## 2021-05-25 LAB — BASIC METABOLIC PANEL
BUN: 16 mg/dL (ref 6–23)
CO2: 28 mEq/L (ref 19–32)
Calcium: 9.4 mg/dL (ref 8.4–10.5)
Chloride: 102 mEq/L (ref 96–112)
Creatinine, Ser: 0.85 mg/dL (ref 0.40–1.20)
GFR: 88.89 mL/min (ref >60.00)
Glucose, Bld: 66 mg/dL — ABNORMAL LOW (ref 70–99)
Potassium: 4.1 mEq/L (ref 3.5–5.1)
Sodium: 138 mEq/L (ref 135–145)

## 2021-05-25 NOTE — Patient Instructions (Addendum)
Stop by the lab prior to leaving today. I will notify you of your results once received.   Avoid doubling your Buspar in the morning. Reach out to your psychiatrist.  Ensure you are consuming 64 ounces of water daily.  It was a pleasure to see you today!

## 2021-05-25 NOTE — Assessment & Plan Note (Addendum)
With accompanying symptoms as noted in HPI.  Symptoms are worrisome given her history of stroke, but today's exam is negative for stroke, question TIA?   Suspect side effects of Buspar to be contributing as she doubled her dose this morning. Common side effects of Buspar include dizziness, blurred vision, nausea, cold and clammy skin, etc. Discussed this with patient today.  Discussed to avoid doubling her dose of Buspar and to reach out to her psychiatrist.   Checking CBC and BMP today. Discussed that if symptoms return for her to contact her neurologist as well. She agrees.

## 2021-05-25 NOTE — Progress Notes (Signed)
Subjective:    Patient ID: Autumn [name], female    DOB: 02/26/1986, 35 y.o.   MRN: [id number]  HPI  Autumn [name] is a very pleasant 35 y.o. female with a history of bilateral pulmonary embolism, anxiety and depression, lacunar stroke, who presents today to discuss low blood pressure.  She was sitting at her desk at work this morning, began to feel "lightheaded" so she had one of her co-workers check her BP which was 100/64 with HR of 118, but then "jumping around from 70's-90's. Her BP was checked about 10 minutes later which was 92/60. At that point she felt clammy, her body felt heavy. Her symptoms lasted for about 45 minutes and have mostly resolved.   Now she feels "foggy" and heaviness to the head, also has to concentrate harder on her normal tasks. She's had about 16 ounces of water today, typically drinks a lot of water daily.   Over this week she's noticed a "film" sensation over her eyes. She mostly feels "weird" right now, lightheadedness has improved. She denies chest tightness, shortness of breath, headaches, urinary symptoms, fevers, diarrhea, abdominal pain (outside of her normal IBS symptoms). She is under a lot of stress and her psychiatrist has increased the dose of her Buspar 15 mg TID last week, she did take 30 mg this AM as she often forgets her afternoon dose.    BP Readings from Last 3 Encounters:  05/25/21 110/68  04/11/21 110/80  03/17/21 122/80        Review of Systems  Constitutional:  Negative for fever.  Eyes:        Film sensation over eyes  Respiratory:  Negative for chest tightness and shortness of breath.   Cardiovascular:  Negative for chest pain.  Gastrointestinal:  Negative for abdominal pain, nausea and vomiting.  Genitourinary:  Negative for dysuria and frequency.  Neurological:  Positive for light-headedness.       Brain fog  Psychiatric/Behavioral:  The patient is nervous/anxious.         Past Medical History:   Diagnosis Date   Anxiety    Back pain    chronic   Bilateral pulmonary embolism ([place])    a. 06/2020 following COVID infection.   COVID-19 virus infection 06/2020   Depression    Disc disease, degenerative, lumbar or lumbosacral    History of echocardiogram    a. 06/2020 Echo: EF 55-60%, no rwma, nl RV size/fxn.   History of ovarian cyst    Migraine    Palpitations    a. 2/022 Zio: RSR, 85 (47-112). Rare PACs/PVCs. No significant arrhythmias/prolonged pauses.   Prediabetes    Scoliosis    Shingles 06/20/2018   Stroke [facility]) 2022   Remote lacunar strokes noted in the cerebellum by MRI    Social History   Socioeconomic History   Marital status: Married    Spouse name: Not on file   Number of children: 1   Years of education: Not on file   Highest education level: Not on file  Occupational History   Occupation: Transport planner  Tobacco Use   Smoking status: Former    Years: 10.00    Types: Cigarettes    Quit date: 08/18/2016    Years since quitting: 4.7   Smokeless tobacco: Never   Tobacco comments:    3 cigarettes a day  Vaping Use   Vaping Use: Never used  Substance and Sexual Activity   Alcohol use: Yes    Alcohol/week:  0.0 standard drinks    Comment: occasional   Drug use: No   Sexual activity: Yes    Birth control/protection: Pill  Other Topics Concern   Not on file  Social History Narrative   Right handed   Social Determinants of Health   Financial Resource Strain: Not on file  Food Insecurity: Not on file  Transportation Needs: Not on file  Physical Activity: Not on file  Stress: Not on file  Social Connections: Not on file  Intimate Partner Violence: Not on file    Past Surgical History:  Procedure Laterality Date   APPENDECTOMY     BREAST SURGERY Right 2008   lump/ benign   TEE WITHOUT CARDIOVERSION N/A 12/13/2020   Procedure: TRANSESOPHAGEAL ECHOCARDIOGRAM (TEE);  Surgeon: Nelva Bush, MD;  Location: [facility] ORS;  Service: Cardiovascular;   Laterality: N/A;    Family History  Problem Relation Age of Onset   Cancer Mother 67       breast   Breast cancer Mother 10       and 86   Alcohol abuse Father    Vision loss Maternal Grandmother    Heart failure Maternal Grandmother    Cancer Maternal Grandfather    Alcohol abuse Paternal Grandfather    Arthritis Maternal Aunt    Anxiety disorder Maternal Aunt    Depression Maternal Aunt     Allergies  Allergen Reactions   Imitrex [Sumatriptan] Other (See Comments)    Vomiting, Slurred Speech and Facial Numbness   Gadolinium Derivatives Other (See Comments)    Lip tingling, scratchy throat   Iodinated Diagnostic Agents Itching and Cough    Patient experienced sneezing, throat itching, and "feeling like needing to cough" Given 25 mg benadryl and observed x 20 mins/MMS Per dr Polly Cobia patient should have 13 hour prep   Other Itching    States reaction to contrast dye used for CTA of her brain - itching of tongue, tingling lower lip, coughing    Current Outpatient Medications on File Prior to Visit  Medication Sig Dispense Refill   acetaminophen (TYLENOL) 500 MG tablet Take 1,000 mg by mouth every 6 (six) hours as needed (headaches/migraines/pain).     aspirin 81 MG EC tablet Take by mouth.     busPIRone (BUSPAR) 15 MG tablet Take 1 tablet (15 mg total) by mouth 3 (three) times daily. 90 tablet 1   clonazePAM (KLONOPIN) 0.5 MG tablet Take 1 tablet (0.5 mg total) by mouth daily as needed for anxiety. For severe panic attacks 15 tablet 1   cyanocobalamin 1000 MCG tablet Take 1,000 mcg by mouth daily at 12 noon.     escitalopram (LEXAPRO) 20 MG tablet Take 1 tablet (20 mg total) by mouth daily. 90 tablet 0   valACYclovir (VALTREX) 1000 MG tablet Take 2 g by mouth 2 (two) times daily as needed (fever blisters).     No current facility-administered medications on file prior to visit.    BP 110/68    Pulse 86    Temp 98.6 F (37 C) (Temporal)    Ht 5\' 10"  (1.778 m)    Wt 245 lb  (111.1 kg)    SpO2 94%    BMI 35.15 kg/m  Objective:   Physical Exam Eyes:     Extraocular Movements: Extraocular movements intact.  Cardiovascular:     Rate and Rhythm: Normal rate and regular rhythm.  Pulmonary:     Effort: Pulmonary effort is normal.     Breath sounds: Normal breath  sounds.  Musculoskeletal:     Cervical back: Neck supple.  Skin:    General: Skin is warm and dry.  Neurological:     Cranial Nerves: No cranial nerve deficit.     Motor: No weakness.     Coordination: Coordination normal.     Gait: Gait normal.          Assessment & Plan:      This visit occurred during the SARS-CoV-2 public health emergency.  Safety protocols were in place, including screening questions prior to the visit, additional usage of staff PPE, and extensive cleaning of exam room while observing appropriate contact time as indicated for disinfecting solutions.

## 2021-05-30 ENCOUNTER — Other Ambulatory Visit: Payer: Self-pay

## 2021-05-30 NOTE — Telephone Encounter (Signed)
View chart

## 2021-05-30 NOTE — Telephone Encounter (Signed)
View Chart °

## 2021-06-06 DIAGNOSIS — E049 Nontoxic goiter, unspecified: Secondary | ICD-10-CM | POA: Diagnosis not present

## 2021-06-20 ENCOUNTER — Encounter: Payer: Self-pay | Admitting: Psychiatry

## 2021-06-20 ENCOUNTER — Telehealth (INDEPENDENT_AMBULATORY_CARE_PROVIDER_SITE_OTHER): Payer: 59 | Admitting: Psychiatry

## 2021-06-20 ENCOUNTER — Other Ambulatory Visit: Payer: Self-pay

## 2021-06-20 DIAGNOSIS — F418 Other specified anxiety disorders: Secondary | ICD-10-CM | POA: Diagnosis not present

## 2021-06-20 DIAGNOSIS — F3342 Major depressive disorder, recurrent, in full remission: Secondary | ICD-10-CM

## 2021-06-20 MED ORDER — BUSPIRONE HCL 15 MG PO TABS
15.0000 mg | ORAL_TABLET | Freq: Two times a day (BID) | ORAL | 1 refills | Status: DC
  Filled 2021-06-20: qty 180, 90d supply, fill #0
  Filled 2021-07-12: qty 60, 30d supply, fill #0
  Filled 2021-08-23: qty 60, 30d supply, fill #1
  Filled 2021-09-25: qty 60, 30d supply, fill #2
  Filled 2021-11-07: qty 60, 30d supply, fill #3

## 2021-06-20 MED ORDER — ESCITALOPRAM OXALATE 20 MG PO TABS
20.0000 mg | ORAL_TABLET | Freq: Every day | ORAL | 0 refills | Status: DC
  Filled 2021-06-20: qty 90, 90d supply, fill #0

## 2021-06-20 NOTE — Progress Notes (Signed)
Virtual Visit via Video Note  I connected with Autumn [name] on 06/20/21 at  3:00 PM EST by a video enabled telemedicine application and verified that I am speaking with the correct person using two identifiers.  Location Provider Location : [facility] Patient Location : Work  Participants: Patient , Provider    I discussed the limitations of evaluation and management by telemedicine and the availability of in person appointments. The patient expressed understanding and agreed to proceed.   I discussed the assessment and treatment plan with the patient. The patient was provided an opportunity to ask questions and all were answered. The patient agreed with the plan and demonstrated an understanding of the instructions.   The patient was advised to call back or seek an in-person evaluation if the symptoms worsen or if the condition fails to improve as anticipated.    [place] MD OP Progress Note  06/20/2021 3:10 PM Autumn [name]  MRN:  [id number]  Chief Complaint:  Chief Complaint   Follow-up; Depression; Anxiety    HPI: Autumn [name] is a 36 year old Caucasian female , employed, separated, lives in [place] , has a history of MDD, other specified anxiety disorder, prediabetic, history of tachycardia, history of bilateral pulmonary embolism on anticoagulation was evaluated by telemedicine today.  Patient reports since the past few days her irritability has improved.  She has been taking the BuSpar 15 mg twice daily dosage and not 3 times a day.  She had lightheadedness likely due to higher dosage of BuSpar and was advised by primary provider to reduce the dosage back.  I have reviewed notes per Ms. Autumn Boston, NP dated 05/25/2021.  Patient reports she does have anxiety about her situational stressors, going through divorce and work-related stressors.  She continues to follow-up with her therapist.  Reports managing it better.  Reports sleep as fair.  Denies any  suicidality, homicidality or perceptual disturbances.  Patient denies any other concerns today.  Visit Diagnosis:    ICD-10-CM   1. MDD (major depressive disorder), recurrent, in full remission ([place])  F33.42 escitalopram (LEXAPRO) 20 MG tablet    2. Other specified anxiety disorders  F41.8 busPIRone (BUSPAR) 15 MG tablet   with limited symptom attack      Past Psychiatric History: Reviewed past psychiatric history from progress note on 02/10/2020.  Past trials of Lexapro, Wellbutrin-noncompliant.  Past Medical History:  Past Medical History:  Diagnosis Date   Anxiety    Back pain    chronic   Bilateral pulmonary embolism ([place])    a. 06/2020 following COVID infection.   COVID-19 virus infection 06/2020   Depression    Disc disease, degenerative, lumbar or lumbosacral    History of echocardiogram    a. 06/2020 Echo: EF 55-60%, no rwma, nl RV size/fxn.   History of ovarian cyst    Migraine    Palpitations    a. 2/022 Zio: RSR, 85 (47-112). Rare PACs/PVCs. No significant arrhythmias/prolonged pauses.   Prediabetes    Scoliosis    Shingles 06/20/2018   Stroke [facility]) 2022   Remote lacunar strokes noted in the cerebellum by MRI    Past Surgical History:  Procedure Laterality Date   APPENDECTOMY     BREAST SURGERY Right 2008   lump/ benign   TEE WITHOUT CARDIOVERSION N/A 12/13/2020   Procedure: TRANSESOPHAGEAL ECHOCARDIOGRAM (TEE);  Surgeon: Nelva Bush, MD;  Location: [facility] ORS;  Service: Cardiovascular;  Laterality: N/A;    Family Psychiatric History: Reviewed family psychiatric history from  progress note on 02/10/2020.  Family History:  Family History  Problem Relation Age of Onset   Cancer Mother 53       breast   Breast cancer Mother 33       and 2   Alcohol abuse Father    Vision loss Maternal Grandmother    Heart failure Maternal Grandmother    Cancer Maternal Grandfather    Alcohol abuse Paternal Grandfather    Arthritis Maternal Aunt    Anxiety disorder  Maternal Aunt    Depression Maternal Aunt     Social History: Reviewed social history from progress note on 02/10/2020. Social History   Socioeconomic History   Marital status: Married    Spouse name: Not on file   Number of children: 1   Years of education: Not on file   Highest education level: Not on file  Occupational History   Occupation: Transport planner  Tobacco Use   Smoking status: Former    Years: 10.00    Types: Cigarettes    Quit date: 08/18/2016    Years since quitting: 4.8   Smokeless tobacco: Never   Tobacco comments:    3 cigarettes a day  Vaping Use   Vaping Use: Never used  Substance and Sexual Activity   Alcohol use: Yes    Alcohol/week: 0.0 standard drinks    Comment: occasional   Drug use: No   Sexual activity: Yes    Birth control/protection: Pill  Other Topics Concern   Not on file  Social History Narrative   Right handed   Social Determinants of Health   Financial Resource Strain: Not on file  Food Insecurity: Not on file  Transportation Needs: Not on file  Physical Activity: Not on file  Stress: Not on file  Social Connections: Not on file    Allergies:  Allergies  Allergen Reactions   Imitrex [Sumatriptan] Other (See Comments)    Vomiting, Slurred Speech and Facial Numbness   Gadolinium Derivatives Other (See Comments)    Lip tingling, scratchy throat   Iodinated Contrast Media Itching and Cough    Patient experienced sneezing, throat itching, and "feeling like needing to cough" Given 25 mg benadryl and observed x 20 mins/MMS Per dr Polly Cobia patient should have 13 hour prep   Other Itching    States reaction to contrast dye used for CTA of her brain - itching of tongue, tingling lower lip, coughing    Metabolic Disorder Labs: Lab Results  Component Value Date   HGBA1C 6.1 12/08/2020   No results found for: PROLACTIN Lab Results  Component Value Date   CHOL 146 12/08/2020   TRIG 94.0 12/08/2020   HDL 50.80 12/08/2020   CHOLHDL 3  12/08/2020   VLDL 18.8 12/08/2020   LDLCALC 77 12/08/2020   LDLCALC 115 (H) 06/28/2020   Lab Results  Component Value Date   TSH 2.03 12/30/2020   TSH 1.029 07/08/2020    Therapeutic Level Labs: No results found for: LITHIUM No results found for: VALPROATE No components found for:  CBMZ  Current Medications: Current Outpatient Medications  Medication Sig Dispense Refill   acetaminophen (TYLENOL) 500 MG tablet Take 1,000 mg by mouth every 6 (six) hours as needed (headaches/migraines/pain).     aspirin 81 MG EC tablet Take by mouth.     busPIRone (BUSPAR) 15 MG tablet Take 1 tablet (15 mg total) by mouth 2 (two) times daily. 180 tablet 1   clonazePAM (KLONOPIN) 0.5 MG tablet Take 1 tablet (0.5  mg total) by mouth daily as needed for anxiety. For severe panic attacks 15 tablet 1   cyanocobalamin 1000 MCG tablet Take 1,000 mcg by mouth daily at 12 noon.     escitalopram (LEXAPRO) 20 MG tablet Take 1 tablet (20 mg total) by mouth daily. 90 tablet 0   valACYclovir (VALTREX) 1000 MG tablet Take 2 g by mouth 2 (two) times daily as needed (fever blisters).     No current facility-administered medications for this visit.     Musculoskeletal: Strength & Muscle Tone:  UTA Gait & Station:  Seated Patient leans: N/A  Psychiatric Specialty Exam: Review of Systems  Psychiatric/Behavioral:  The patient is nervous/anxious.   All other systems reviewed and are negative.  There were no vitals taken for this visit.There is no height or weight on file to calculate BMI.  General Appearance: Casual  Eye Contact:  Good  Speech:  Clear and Coherent  Volume:  Normal  Mood:  Anxious improving  Affect:  Congruent  Thought Process:  Goal Directed and Descriptions of Associations: Intact  Orientation:  Full (Time, Place, and Person)  Thought Content: Logical   Suicidal Thoughts:  No  Homicidal Thoughts:  No  Memory:  Immediate;   Fair Recent;   Fair Remote;   Fair  Judgement:  Fair  Insight:   Fair  Psychomotor Activity:  Normal  Concentration:  Concentration: Fair and Attention Span: Fair  Recall:  [employer] of Knowledge: Fair  Language: Fair  Akathisia:  No  Handed:  Right  AIMS (if indicated): not done  Assets:  Communication Skills Desire for Improvement Housing Resilience Social Support  ADL's:  Intact  Cognition: WNL  Sleep:  Fair   Screenings: GAD-7    Flowsheet Row Video Visit from 06/20/2021 in [place] Office Visit from 05/16/2021 in [place] Video Visit from 04/11/2021 in [place] Video Visit from 03/13/2021 in [place] Video Visit from 03/01/2021 in [place]  Total GAD-7 Score 2 7 3 3 10       PHQ2-9    Flowsheet Row Video Visit from 06/20/2021 in [place] Office Visit from 05/16/2021 in [place] Video Visit from 04/11/2021 in [place] Video Visit from 03/13/2021 in [place] Video Visit from 03/01/2021 in [place]  PHQ-2 Total Score 1 2 2 1 2   PHQ-9 Total Score -- 6 6 5 10       [place] Office Visit from 05/16/2021 in [place] Video Visit from 01/11/2021 in [place] Video Visit from 10/12/2020 in [place] No Risk No Risk No Risk        Assessment and Plan: DEYSI SOLDO is a 36 year old Caucasian female, employed, lives in [place], has a history of depression, anxiety was evaluated by telemedicine today.  Patient is currently improving on the current medication regimen.  Plan as noted below.  Plan MDD in remission Lexapro 20 mg p.o. daily Continue CBT  Anxiety disorder-improving BuSpar 15 mg p.o. twice  daily. Klonopin 0.5 mg as needed for severe anxiety attacks-she has not needed it yet.  Reviewed notes per Ms. Barnetta Chapel Clark-dated 05/25/2021-patient was advised to stay on a lower dosage of BuSpar due to her lightheadedness-suspicious side effect to BuSpar.   Continue CBT with Ms. Dayle Points.  Follow-up in clinic in 2 months or  sooner if needed.  This note was generated in part or whole with voice recognition software. Voice recognition is usually quite accurate but there are transcription errors that can and very often do occur. I apologize for any typographical errors that were not detected and corrected.         Ursula Alert, MD 06/21/2021, 9:54 AM

## 2021-07-03 ENCOUNTER — Other Ambulatory Visit: Payer: Self-pay

## 2021-07-11 ENCOUNTER — Ambulatory Visit: Payer: 59 | Admitting: Oncology

## 2021-07-11 ENCOUNTER — Other Ambulatory Visit: Payer: 59

## 2021-07-12 ENCOUNTER — Other Ambulatory Visit: Payer: Self-pay

## 2021-07-17 NOTE — Progress Notes (Signed)
NEUROLOGY FOLLOW UP OFFICE NOTE  Autumn [name] [id number]  Assessment/Plan:    Ataxia.  MRI of brain showed findings concerning for remote small infarcts within the bilateral cerebellar hemispheres.  Embolic stroke due to hypercoagulable state following COVID is possible.   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  Unclear etiology   1.In light of new symptoms and to follow up on possible aneurysm, check MRI and MRA of brain without contrast 2. Continue ASA 81mg  daily 3. Further recommendations pending results. 4.  Follow up in 6 months.     Subjective:  Autumn [name] is a 36 year old right-handed female with migraines, DDD of lumbar spine, depression and anxiety and history of bilateral PE following COVID infection who follows up for ataxia  UPDATE: MRI of brain with and without contrast on 12/01/2020 personally reviewed showed what was read as tiny remote lacunar infarcts in the bilateral cerebellar hemispheres.  CTA head and neck on 12/26/2020 personally reviewed showed no LVO or hemodynamically significant stenosis but did demonstrate a 1 mm infundibulum vs aneurysm arising from the supraclinoid left ICA.  TEE on 12/13/2020 was negative.    No longer on Eliquis.  Now taking ASA.  Balance overall is better.  Passed two weeks reports foggy-headed.  Also over past 2 weeks, she will experience intermittent episodes of paresthesias radiating up the left side of neck and face lasting a few seconds.     HISTORY: She had COVID in December 2021.  She had multiple bilateral PEs in January and was started on Eliquis.  Hypercoagulable workup was negative.  She also had palpitations.  Cardiac event monitor in January-February 2022 was negative. Around March 2022, she started having balance problems.  She may suddenly feel herself leaning towards the right.  While walking, she may start veering towards the right.  Sometimes if she bends over to  pick something up, she may start leaning over to the right.  When she parks the car, she may park it close to or a little over the right line, but she doesn't veer while driving.  This doesn't happen all of the time.  No associated dizziness/vertigo.  If she stares or concentrates on something, such as looking at the computer, the image may slightly cross.  She denies difficulty with depth perception or reaching for objects with either hand.  Denies hearing [name], tinnitus, aural fullness.  On three separate occasions, she feels a sensation beginning from back of neck and spreading over her head to the front, lasting 2 to 3 seconds.  Afterwards, her vision was not focused, lasting several seconds.  On one occasion, she felt dizzy and nauseous for 45 minutes.  These events are different than her migraines, which are right temporal/retro-orbital pain with some nausea, occurs infrequently (only one since beginning in the year).  Sometimes she feels she has to focus more when she looks.  Eye exam was normal.    She is not aware of any family history of neurologic conditions.    PAST MEDICAL HISTORY: Past Medical History:  Diagnosis Date   Anxiety    Back pain    chronic   Bilateral pulmonary embolism ([place])    a. 06/2020 following COVID infection.   COVID-19 virus infection 06/2020   Depression    Disc disease, degenerative, lumbar or lumbosacral    History of echocardiogram    a. 06/2020 Echo: EF 55-60%, no rwma, nl RV size/fxn.   History of ovarian  cyst    Migraine    Palpitations    a. 2/022 Zio: RSR, 85 (47-112). Rare PACs/PVCs. No significant arrhythmias/prolonged pauses.   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     aspirin 81 MG EC tablet Take by mouth.     busPIRone (BUSPAR) 15 MG tablet Take 1 tablet (15 mg total) by mouth 2 (two) times daily. 180 tablet 1   clonazePAM (KLONOPIN) 0.5 MG tablet Take 1 tablet (0.5 mg total) by mouth daily as needed for anxiety. For severe panic attacks 15 tablet 1   cyanocobalamin 1000 MCG tablet Take 1,000 mcg by mouth daily at 12 noon.     escitalopram (LEXAPRO) 20 MG tablet Take 1 tablet (20 mg total) by mouth daily. 90 tablet 0   valACYclovir (VALTREX) 1000 MG tablet Take 2 g by mouth 2 (two) times daily as needed (fever blisters).     No current facility-administered medications on file prior to visit.    ALLERGIES: Allergies  Allergen Reactions   Imitrex [Sumatriptan] Other (See Comments)    Vomiting, Slurred Speech and Facial Numbness   Gadolinium Derivatives Other (See Comments)    Lip tingling, scratchy throat   Iodinated Contrast Media Itching and Cough    Patient experienced sneezing, throat itching, and "feeling like needing to cough" Given 25 mg benadryl and observed x 20 mins/MMS Per dr Polly Cobia patient should have 13 hour prep   Other Itching    States reaction to contrast dye used for CTA of her brain - itching of tongue, tingling lower lip, coughing    FAMILY HISTORY: Family History  Problem Relation Age of Onset   Cancer Mother 57       breast   Breast cancer Mother 4       and 36   Alcohol abuse Father    Vision [name] Maternal Grandmother    Heart failure Maternal Grandmother    Cancer Maternal Grandfather    Alcohol abuse Paternal Grandfather    Arthritis Maternal Aunt    Anxiety disorder Maternal Aunt    Depression Maternal Aunt       Objective:  Blood pressure 115/84, pulse 89, height 5\' 9"  (1.753 m), weight 248 lb 9.6 oz (112.8 kg), SpO2 96 %. General: No acute distress.  Patient appears well-groomed.   Head:  Normocephalic/atraumatic Eyes:  Fundi examined but not visualized Neck: supple, no  paraspinal tenderness, full range of motion Heart:  Regular rate and rhythm Lungs:  Clear to auscultation bilaterally Back: No paraspinal tenderness Neurological Exam: alert and oriented to person, place, and time.  Speech fluent and not dysarthric, language intact.  CN II-XII intact. Bulk and tone normal, muscle strength 5/5 throughout.  Sensation to temperature and vibration intact.  Deep tendon reflexes 2+ throughout, toes downgoing.  Finger to nose testing intact.  Gait normal, able to turn and tandem walk.  Romberg with sway.   Autumn Clines, DO  CC: Alma Friendly, NP

## 2021-07-18 ENCOUNTER — Ambulatory Visit: Payer: 59 | Admitting: Neurology

## 2021-07-18 ENCOUNTER — Other Ambulatory Visit: Payer: Self-pay

## 2021-07-18 ENCOUNTER — Encounter: Payer: Self-pay | Admitting: Neurology

## 2021-07-18 VITALS — BP 115/84 | HR 89 | Ht 69.0 in | Wt 248.6 lb

## 2021-07-18 DIAGNOSIS — I639 Cerebral infarction, unspecified: Secondary | ICD-10-CM | POA: Diagnosis not present

## 2021-07-18 DIAGNOSIS — I671 Cerebral aneurysm, nonruptured: Secondary | ICD-10-CM

## 2021-07-18 DIAGNOSIS — R200 Anesthesia of skin: Secondary | ICD-10-CM

## 2021-07-18 NOTE — Patient Instructions (Signed)
Continue aspirin 81mg  daily Check MRI and MRA of brain Further recommendations pending results Otherwise, follow up in 6 months.

## 2021-07-24 ENCOUNTER — Ambulatory Visit
Admission: RE | Admit: 2021-07-24 | Discharge: 2021-07-24 | Disposition: A | Discharge status: Home / Self Care | Payer: 59 | Source: Ambulatory Visit | Attending: Neurology | Admitting: Neurology

## 2021-07-24 DIAGNOSIS — I671 Cerebral aneurysm, nonruptured: Secondary | ICD-10-CM

## 2021-07-24 DIAGNOSIS — I639 Cerebral infarction, unspecified: Secondary | ICD-10-CM

## 2021-07-24 DIAGNOSIS — Z1389 Encounter for screening for other disorder: Secondary | ICD-10-CM | POA: Diagnosis not present

## 2021-07-25 ENCOUNTER — Telehealth: Payer: Self-pay | Admitting: Neurology

## 2021-07-25 NOTE — Progress Notes (Signed)
Patient advised of her MRI results.

## 2021-07-25 NOTE — Progress Notes (Signed)
Tried calling pt no answer. LMOVM.

## 2021-08-23 ENCOUNTER — Telehealth (INDEPENDENT_AMBULATORY_CARE_PROVIDER_SITE_OTHER): Payer: 59 | Admitting: Psychiatry

## 2021-08-23 ENCOUNTER — Other Ambulatory Visit: Payer: Self-pay

## 2021-08-23 ENCOUNTER — Encounter: Payer: Self-pay | Admitting: Psychiatry

## 2021-08-23 DIAGNOSIS — F418 Other specified anxiety disorders: Secondary | ICD-10-CM | POA: Diagnosis not present

## 2021-08-23 DIAGNOSIS — F3342 Major depressive disorder, recurrent, in full remission: Secondary | ICD-10-CM | POA: Diagnosis not present

## 2021-08-23 MED ORDER — ESCITALOPRAM OXALATE 20 MG PO TABS
20.0000 mg | ORAL_TABLET | Freq: Every day | ORAL | 0 refills | Status: DC
  Filled 2021-08-23: qty 90, 90d supply, fill #0

## 2021-08-23 NOTE — Progress Notes (Signed)
Virtual Visit via Video Note  I connected with Autumn [name] on 08/23/21 at  1:00 PM EDT by a video enabled telemedicine application and verified that I am speaking with the correct person using two identifiers.  Location Provider Location : [facility] Patient Location : Car  Participants: Patient , Provider    I discussed the limitations of evaluation and management by telemedicine and the availability of in person appointments. The patient expressed understanding and agreed to proceed.    I discussed the assessment and treatment plan with the patient. The patient was provided an opportunity to ask questions and all were answered. The patient agreed with the plan and demonstrated an understanding of the instructions.   The patient was advised to call back or seek an in-person evaluation if the symptoms worsen or if the condition fails to improve as anticipated.   BH MD OP Progress Note  08/23/2021 1:19 PM Autumn [name]  MRN:  [id number]  Chief Complaint:  Chief Complaint  Patient presents with   Follow-up: 36 year old Caucasian female who has a history of MDD, anxiety, presented for medication management.   HPI: Autumn [name] is a 36 year old Caucasian female, employed, separated, lives in [place], has a history of MDD, other specified anxiety disorder, prediabetes, history of tachycardia, history of bilateral pulmonary embolism on anticoagulation was evaluated by telemedicine today.  Patient today reports she is currently doing well with regards to her mood.  Denies any significant depression symptoms.  Reports anxiety symptoms are more under control.  Couple of weeks ago she had a panic attack and she had to use 1 dose of Klonopin.  She otherwise has not been using Klonopin.  Currently reports anxiety has improved.  Continues to follow-up with her therapist Ms. Ardelle Balls.  Reports sleep is good.  Patient reports appetite as fair, she tends to overeat at times.   Agreeable that she needs to start working on her diet.  Reports work is going well.  Her work changed location to [employer] which is closer to home.  That helps.  Denies any suicidality, homicidality or perceptual disturbances.  Patient denies any other concerns today.  Visit Diagnosis:    ICD-10-CM   1. MDD (major depressive disorder), recurrent, in full remission (HCC)  F33.42 escitalopram (LEXAPRO) 20 MG tablet    2. Other specified anxiety disorders  F41.8    limited symptom attacks      Past Psychiatric History: Reviewed past psychiatric history from progress note on 02/10/2020.  Past trials of Lexapro, Wellbutrin-noncompliant.  Past Medical History:  Past Medical History:  Diagnosis Date   Anxiety    Back pain    chronic   Bilateral pulmonary embolism (HCC)    a. 06/2020 following COVID infection.   COVID-19 virus infection 06/2020   Depression    Disc disease, degenerative, lumbar or lumbosacral    History of echocardiogram    a. 06/2020 Echo: EF 55-60%, no rwma, nl RV size/fxn.   History of ovarian cyst    Migraine    Palpitations    a. 2/022 Zio: RSR, 85 (47-112). Rare PACs/PVCs. No significant arrhythmias/prolonged pauses.   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  Family History:  Family History  Problem Relation Age of Onset   Cancer Mother 24       breast   Breast cancer Mother 43       and 48   Alcohol abuse Father    Vision loss Maternal Grandmother    Heart failure  Maternal Grandmother    Cancer Maternal Grandfather    Alcohol abuse Paternal Grandfather    Arthritis Maternal Aunt    Anxiety disorder Maternal Aunt    Depression Maternal Aunt     Social History: Reviewed social history from progress note on 02/10/2020. Social History   Socioeconomic History   Marital status: Married    Spouse name: Not on file   Number of children: 1   Years of education: Not on file   Highest education level: Not on file  Occupational History   Occupation: Futures trader  Tobacco Use   Smoking status: Former    Years: 10.00    Types: Cigarettes    Quit date: 08/18/2016    Years since quitting: 5.0   Smokeless tobacco: Never   Tobacco comments:    3 cigarettes a day  Vaping Use   Vaping Use: Never used  Substance and Sexual Activity   Alcohol use: Yes    Alcohol/week: 0.0 standard drinks    Comment: occasional   Drug use: No   Sexual activity: Yes    Birth control/protection: Pill  Other Topics Concern   Not on file  Social History Narrative   Right handed   Social Determinants of Health   Financial Resource Strain: Not on file  Food Insecurity: Not on file  Transportation Needs: Not on file  Physical Activity: Not on file  Stress: Not on file  Social Connections: Not on file    Allergies:  Allergies  Allergen Reactions   Imitrex [Sumatriptan] Other (See Comments)    Vomiting, Slurred Speech and Facial Numbness   Gadolinium Derivatives Other (See Comments)    Lip tingling, scratchy throat   Iodinated Contrast Media Itching and Cough    Patient experienced sneezing, throat itching, and "feeling like needing to cough" Given 25 mg benadryl and observed x 20 mins/MMS Per dr Ashley Murrain patient should have 13 hour prep   Other Itching    States reaction to contrast dye used for CTA of her brain - itching of tongue, tingling lower lip, coughing    Metabolic Disorder Labs: Lab Results  Component Value Date   HGBA1C 6.1 12/08/2020   No results  found for: PROLACTIN Lab Results  Component Value Date   CHOL 146 12/08/2020   TRIG 94.0 12/08/2020   HDL 50.80 12/08/2020   CHOLHDL 3 12/08/2020   VLDL 18.8 12/08/2020   LDLCALC 77 12/08/2020   LDLCALC 115 (H) 06/28/2020   Lab Results  Component Value Date   TSH 2.03 12/30/2020   TSH 1.029 07/08/2020    Therapeutic Level Labs: No results found for: LITHIUM No results found for: VALPROATE No components found for:  CBMZ  Current Medications: Current Outpatient Medications  Medication Sig Dispense Refill   acetaminophen (TYLENOL) 500 MG tablet Take 1,000 mg by mouth every 6 (six) hours as needed (headaches/migraines/pain).     aspirin 81 MG EC tablet Take by mouth.     busPIRone (BUSPAR)  15 MG tablet Take 1 tablet (15 mg total) by mouth 2 (two) times daily. 180 tablet 1   clonazePAM (KLONOPIN) 0.5 MG tablet Take 1 tablet (0.5 mg total) by mouth daily as needed for anxiety. For severe panic attacks 15 tablet 1   cyanocobalamin 1000 MCG tablet Take 1,000 mcg by mouth daily at 12 noon.     escitalopram (LEXAPRO) 20 MG tablet Take 1 tablet (20 mg total) by mouth daily. 90 tablet 0   valACYclovir (VALTREX) 1000 MG tablet Take 2 g by mouth 2 (two) times daily as needed (fever blisters).     No current facility-administered medications for this visit.     Musculoskeletal: Strength & Muscle Tone:  UTA Gait & Station:  Seated Patient leans: N/A  Psychiatric Specialty Exam: Review of Systems  Psychiatric/Behavioral:  Negative for agitation, behavioral problems, confusion, decreased concentration, dysphoric mood, hallucinations, self-injury, sleep disturbance and suicidal ideas. The patient is not nervous/anxious and is not hyperactive.   All other systems reviewed and are negative.  There were no vitals taken for this visit.There is no height or weight on file to calculate BMI.  General Appearance: Casual  Eye Contact:  Good  Speech:  Normal Rate  Volume:  Normal  Mood:   Euthymic  Affect:  Congruent  Thought Process:  Goal Directed and Descriptions of Associations: Intact  Orientation:  Full (Time, Place, and Person)  Thought Content: Logical   Suicidal Thoughts:  No  Homicidal Thoughts:  No  Memory:  Immediate;   Fair Recent;   Fair Remote;   Fair  Judgement:  Fair  Insight:  Fair  Psychomotor Activity:  Normal  Concentration:  Concentration: Fair and Attention Span: Fair  Recall:  [employer] of Knowledge: Fair  Language: Fair  Akathisia:  No  Handed:  Right  AIMS (if indicated): not done  Assets:  Communication Skills Desire for Improvement Housing Social Support  ADL's:  Intact  Cognition: WNL  Sleep:  Fair   Screenings: GAD-7    Flowsheet Row Video Visit from 06/20/2021 in [facility] Office Visit from 05/16/2021 in [facility] Video Visit from 04/11/2021 in [facility] Video Visit from 03/13/2021 in [facility] Video Visit from 03/01/2021 in [facility]  Total GAD-7 Score 2 7 3 3 10       PHQ2-9    Flowsheet Row Video Visit from 08/23/2021 in [facility] Video Visit from 06/20/2021 in [facility] Office Visit from 05/16/2021 in [facility] Video Visit from 04/11/2021 in [facility] Video Visit from 03/13/2021 in [facility]  PHQ-2 Total Score 0 1 2 2 1   PHQ-9 Total Score -- -- 6 6 5       Flowsheet Row Office Visit from 05/16/2021 in [facility] Video Visit from 01/11/2021 in [facility] Video Visit from 10/12/2020 in [facility]  C-SSRS RISK CATEGORY No Risk No Risk No Risk        Assessment and Plan: Autumn [name] is a 36 year old Caucasian female, employed, lives  in [place], has a history of depression, anxiety was evaluated by telemedicine today.  Patient is currently stable.  Plan as noted below.  Plan  MDD in remission Lexapro 20 mg p.o. daily Continue CBT  Anxiety disorder-stable BuSpar 15 mg p.o. twice daily Klonopin 0.5 mg as needed for severe anxiety attacks.  She has  been limiting use.  Continue psychotherapy sessions with Ms. Sander Radon.  Follow-up in clinic in 3 months or sooner if needed.  Collaboration of Care: Collaboration of Care: Referral or follow-up with counselor/therapist AEB encouraged to follow up with therapist.  Patient/Guardian was advised Release of Information must be obtained prior to any record release in order to collaborate their care with an outside provider. Patient/Guardian was advised if they have not already done so to contact the registration department to sign all necessary forms in order for [place] to release information regarding their care.   Consent: Patient/Guardian gives verbal consent for treatment and assignment of benefits for services provided during this visit. Patient/Guardian expressed understanding and agreed to proceed.   This note was generated in part or whole with voice recognition software. Voice recognition is usually quite accurate but there are transcription errors that can and very often do occur. I apologize for any typographical errors that were not detected and corrected.      Jomarie Longs, MD 08/23/2021, 1:19 PM

## 2021-09-25 ENCOUNTER — Other Ambulatory Visit: Payer: Self-pay

## 2021-10-31 ENCOUNTER — Other Ambulatory Visit: Payer: Self-pay | Admitting: Primary Care

## 2021-10-31 ENCOUNTER — Other Ambulatory Visit: Payer: Self-pay | Admitting: Internal Medicine

## 2021-10-31 DIAGNOSIS — Z1231 Encounter for screening mammogram for malignant neoplasm of breast: Secondary | ICD-10-CM

## 2021-11-03 ENCOUNTER — Other Ambulatory Visit: Payer: Self-pay | Admitting: Psychiatry

## 2021-11-03 ENCOUNTER — Telehealth (HOSPITAL_COMMUNITY): Payer: Self-pay | Admitting: *Deleted

## 2021-11-03 ENCOUNTER — Other Ambulatory Visit: Payer: Self-pay

## 2021-11-03 DIAGNOSIS — F3342 Major depressive disorder, recurrent, in full remission: Secondary | ICD-10-CM

## 2021-11-03 MED ORDER — ESCITALOPRAM OXALATE 20 MG PO TABS
20.0000 mg | ORAL_TABLET | Freq: Every day | ORAL | 0 refills | Status: DC
  Filled 2021-11-03: qty 90, 90d supply, fill #0

## 2021-11-03 NOTE — Addendum Note (Signed)
Addended byUrsula Alert on: 11/03/2021 10:28 AM   Modules accepted: Orders

## 2021-11-03 NOTE — Telephone Encounter (Signed)
I have sent Lexapro to pharmacy at [facility].

## 2021-11-03 NOTE — Telephone Encounter (Signed)
PATIENT LVM STATED THAT SHE NEEDS REFILL ON escitalopram (LEXAPRO) 20 MG tablet Take 1 tablet (20 mg total) by mouth daily

## 2021-11-07 ENCOUNTER — Other Ambulatory Visit: Payer: Self-pay

## 2021-11-08 ENCOUNTER — Other Ambulatory Visit: Payer: Self-pay

## 2021-11-13 ENCOUNTER — Ambulatory Visit
Admission: RE | Admit: 2021-11-13 | Discharge: 2021-11-13 | Disposition: A | Discharge status: Home / Self Care | Payer: 59 | Source: Ambulatory Visit | Attending: Primary Care | Admitting: Primary Care

## 2021-11-13 DIAGNOSIS — Z1231 Encounter for screening mammogram for malignant neoplasm of breast: Secondary | ICD-10-CM | POA: Diagnosis not present

## 2021-11-20 ENCOUNTER — Encounter: Payer: Self-pay | Admitting: Psychiatry

## 2021-11-20 ENCOUNTER — Telehealth (INDEPENDENT_AMBULATORY_CARE_PROVIDER_SITE_OTHER): Payer: 59 | Admitting: Psychiatry

## 2021-11-20 ENCOUNTER — Other Ambulatory Visit: Payer: Self-pay

## 2021-11-20 DIAGNOSIS — F3342 Major depressive disorder, recurrent, in full remission: Secondary | ICD-10-CM | POA: Diagnosis not present

## 2021-11-20 DIAGNOSIS — F418 Other specified anxiety disorders: Secondary | ICD-10-CM

## 2021-11-20 MED ORDER — BUSPIRONE HCL 15 MG PO TABS
15.0000 mg | ORAL_TABLET | Freq: Two times a day (BID) | ORAL | 1 refills | Status: DC
Start: 2021-11-20 — End: 2022-07-20
  Filled 2021-11-20 – 2021-12-25 (×2): qty 180, 90d supply, fill #0

## 2021-11-20 NOTE — Progress Notes (Signed)
Virtual Visit via Video Note  I connected with Autumn [name] on 11/20/21 at  1:00 PM EDT by a video enabled telemedicine application and verified that I am speaking with the correct person using two identifiers.  Location Provider Location : [facility] Patient Location : Work  Participants: Patient , Provider   I discussed the limitations of evaluation and management by telemedicine and the availability of in person appointments. The patient expressed understanding and agreed to proceed.   I discussed the assessment and treatment plan with the patient. The patient was provided an opportunity to ask questions and all were answered. The patient agreed with the plan and demonstrated an understanding of the instructions.   The patient was advised to call back or seek an in-person evaluation if the symptoms worsen or if the condition fails to improve as anticipated.   Autumn [name] OP Progress Note  11/20/2021 1:19 PM Autumn [name]  MRN:  [id number]  Chief Complaint:  Chief Complaint  Patient presents with   Follow-up: 36 year old Caucasian female who has a history of MDD, anxiety, presented for medication management.   HPI: Autumn [name] is a 36 year old Caucasian female, employed, separated, lives in [place], has a history of MDD, other specified anxiety disorder, prediabetes, history of tachycardia, history of bilateral pulmonary embolism on anticoagulation was evaluated by telemedicine today.  Patient today reports overall mood symptoms are stable.  She reports she is in the process of buying a new home and that has been anxiety provoking.  However she has been coping okay.  Reports she has been sleeping okay at night.  However has noticed vivid dreams, sometimes violent dreams.  This has been going on since the past several weeks.  Wonders whether BuSpar could be contributing to this.  Does report taking the BuSpar at the end of the day.  She does forget taking the BuSpar some  nights.  However still gets these nightmares even when she misses the dose.  Patient denies any suicidality, homicidality or perceptual disturbances.  Denies any other concerns today.  Visit Diagnosis:    ICD-10-CM   1. MDD (major depressive disorder), recurrent, in full remission ([place])  F33.42     2. Other specified anxiety disorders  F41.8 busPIRone (BUSPAR) 15 MG tablet   with limited symptom attack      Past Psychiatric History: Reviewed past psychiatric history from progress note on 02/10/2020.  Past trials of Lexapro, Wellbutrin-noncompliant.  Past Medical History:  Past Medical History:  Diagnosis Date   Anxiety    Back pain    chronic   Bilateral pulmonary embolism ([place])    a. 06/2020 following COVID infection.   COVID-19 virus infection 06/2020   Depression    Disc disease, degenerative, lumbar or lumbosacral    History of echocardiogram    a. 06/2020 Echo: EF 55-60%, no rwma, nl RV size/fxn.   History of ovarian cyst    Migraine    Palpitations    a. 2/022 Zio: RSR, 85 (47-112). Rare PACs/PVCs. No significant arrhythmias/prolonged pauses.   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  Family History:  Family History  Problem Relation Age of Onset   Cancer Mother 53       breast   Breast cancer Mother 10       and 65   Alcohol abuse Father    Vision loss Maternal Grandmother    Heart failure Maternal Grandmother    Cancer Maternal Grandfather    Alcohol abuse Paternal  Grandfather    Arthritis Maternal Aunt    Anxiety disorder Maternal Aunt    Depression Maternal Aunt     Social History: Reviewed social history from progress note on 02/10/2020. Social History   Socioeconomic History   Marital status: Married    Spouse name: Not on file   Number of children: 1   Years of education: Not on file   Highest education level: Not on file  Occupational History   Occupation: Transport planner  Tobacco Use   Smoking status: Former    Years: 10.00    Types: Cigarettes    Quit date: 08/18/2016    Years since quitting: 5.2   Smokeless tobacco: Never   Tobacco comments:    3 cigarettes a day  Vaping Use   Vaping Use: Never used  Substance and Sexual Activity   Alcohol use: Yes    Alcohol/week: 0.0 standard drinks of alcohol    Comment: occasional   Drug use: No   Sexual activity: Yes    Birth control/protection: Pill  Other Topics Concern   Not on file  Social History Narrative   Right handed   Social Determinants of Health   Financial Resource Strain: Not on file  Food Insecurity: Not on file  Transportation Needs: Not on file  Physical Activity: Not on file  Stress: Not on file  Social Connections: Not on file    Allergies:  Allergies  Allergen Reactions   Imitrex [Sumatriptan] Other (See Comments)    Vomiting, Slurred Speech and Facial Numbness   Gadolinium Derivatives Other (See Comments)    Lip tingling, scratchy throat   Iodinated Contrast Media Itching and Cough    Patient experienced sneezing, throat itching, and "feeling like needing to cough" Given 25 mg benadryl and observed x 20 mins/MMS Per dr Polly Cobia patient should have 13 hour prep   Other Itching    States reaction to contrast dye used for CTA of her brain - itching of tongue, tingling lower lip, coughing    Metabolic Disorder Labs: Lab Results  Component Value Date   HGBA1C 6.1 12/08/2020   No results found for: "PROLACTIN" Lab Results  Component Value Date   CHOL  146 12/08/2020   TRIG 94.0 12/08/2020   HDL 50.80 12/08/2020   CHOLHDL 3 12/08/2020   VLDL 18.8 12/08/2020   LDLCALC 77 12/08/2020   LDLCALC 115 (H) 06/28/2020   Lab Results  Component Value Date   TSH 2.03 12/30/2020   TSH 1.029 07/08/2020    Therapeutic Level Labs: No results found for: "LITHIUM" No results found for: "VALPROATE" No results found for: "CBMZ"  Current Medications: Current Outpatient Medications  Medication Sig Dispense Refill   acetaminophen (TYLENOL) 500 MG tablet Take 1,000 mg by mouth every 6 (six) hours as needed (headaches/migraines/pain).     aspirin 81 MG EC tablet Take by mouth.     busPIRone (BUSPAR) 15 MG tablet Take 1 tablet (15 mg total) by mouth 2 (two) times daily.  Take daily at 7 AM and 1 PM 180 tablet 1   clonazePAM (KLONOPIN) 0.5 MG tablet Take 1 tablet (0.5 mg total) by mouth daily as needed for anxiety. For severe panic attacks 15 tablet 1   cyanocobalamin 1000 MCG tablet Take 1,000 mcg by mouth daily at 12 noon.     escitalopram (LEXAPRO) 20 MG tablet Take 1 tablet (20 mg total) by mouth daily. 90 tablet 0   valACYclovir (VALTREX) 1000 MG tablet Take 2 g by mouth 2 (two) times daily as needed (fever blisters).     No current facility-administered medications for this visit.     Musculoskeletal: Strength & Muscle Tone:  UTA Gait & Station: normal Patient leans: N/A  Psychiatric Specialty Exam: Review of Systems  Psychiatric/Behavioral:  The patient is nervous/anxious.   All other systems reviewed and are negative.   There were no vitals taken for this visit.There is no height or weight on file to calculate BMI.  General Appearance: Casual  Eye Contact:  Good  Speech:  Clear and Coherent  Volume:  Normal  Mood:  Anxious  Affect:  Congruent  Thought Process:  Goal Directed and Descriptions of Associations: Intact  Orientation:  Full (Time, Place, and Person)  Thought Content: Logical   Suicidal Thoughts:  No  Homicidal  Thoughts:  No  Memory:  Immediate;   Fair Recent;   Fair Remote;   Fair  Judgement:  Fair  Insight:  Fair  Psychomotor Activity:  Normal  Concentration:  Concentration: Fair and Attention Span: Fair  Recall:  [employer] of Knowledge: Fair  Language: Fair  Akathisia:  No  Handed:  Right  AIMS (if indicated): done  Assets:  Communication Skills Desire for Improvement Housing Social Support  ADL's:  Intact  Cognition: WNL  Sleep:  Fair does report nightmares   Screenings: AIMS    Flowsheet Row Video Visit from 11/20/2021 in [place] Total Score 0      GAD-7    Flowsheet Row Video Visit from 11/20/2021 in [place] Video Visit from 06/20/2021 in [place] Office Visit from 05/16/2021 in [place] Video Visit from 04/11/2021 in [place] Video Visit from 03/13/2021 in [place]  Total GAD-7 Score '2 2 7 3 3      '$ PHQ2-9    Flowsheet Row Video Visit from 11/20/2021 in [place] Video Visit from 08/23/2021 in [place] Video Visit from 06/20/2021 in [place] Office Visit from 05/16/2021 in [place] Video Visit from 04/11/2021 in [place]  PHQ-2 Total Score 0 0 '1 2 2  '$ PHQ-9 Total Score -- -- -- 6 6      [place] Visit from 05/16/2021 in [place] Video Visit from 01/11/2021 in [place] Video Visit from 10/12/2020 in [place] No Risk No Risk No Risk        Assessment and Plan: SHELMA EIBEN is a 36 year old Caucasian female, employed, lives in [place], has a history of depression, anxiety was evaluated by  telemedicine today.  Patient does report nightmares, will benefit from the following plan.  Plan MDD in remission Lexapro 20 mg p.o. daily Continue CBT as needed  Anxiety disorder-stable BuSpar 15 mg p.o. twice daily.  Advised patient to take the BuSpar second dosage earlier in  the afternoon and to avoid taking it at bedtime.  Patient to monitor her nightmares.  Discussed dream planning. Klonopin 0.5 mg as needed for severe anxiety attacks.  She has been limiting use.  Follow-up in clinic in 2 months or sooner if needed.    Consent: Patient/Guardian gives verbal consent for treatment and assignment of benefits for services provided during this visit. Patient/Guardian expressed understanding and agreed to proceed.   This note was generated in part or whole with voice recognition software. Voice recognition is usually quite accurate but there are transcription errors that can and very often do occur. I apologize for any typographical errors that were not detected and corrected.      Ursula Alert, [name] 11/21/2021, 8:54 AM

## 2021-12-25 ENCOUNTER — Other Ambulatory Visit: Payer: Self-pay

## 2022-01-11 ENCOUNTER — Other Ambulatory Visit: Payer: Self-pay

## 2022-01-16 ENCOUNTER — Telehealth (INDEPENDENT_AMBULATORY_CARE_PROVIDER_SITE_OTHER): Payer: 59 | Admitting: Psychiatry

## 2022-01-16 ENCOUNTER — Other Ambulatory Visit: Payer: Self-pay

## 2022-01-16 ENCOUNTER — Encounter: Payer: Self-pay | Admitting: Psychiatry

## 2022-01-16 DIAGNOSIS — F3342 Major depressive disorder, recurrent, in full remission: Secondary | ICD-10-CM

## 2022-01-16 DIAGNOSIS — F418 Other specified anxiety disorders: Secondary | ICD-10-CM

## 2022-01-16 MED ORDER — ESCITALOPRAM OXALATE 20 MG PO TABS
20.0000 mg | ORAL_TABLET | Freq: Every day | ORAL | 1 refills | Status: DC
  Filled 2022-01-16 – 2022-03-05 (×2): qty 90, 90d supply, fill #0
  Filled 2022-07-09: qty 90, 90d supply, fill #1

## 2022-01-16 NOTE — Progress Notes (Unsigned)
Virtual Visit via Video Note  I connected with Autumn [name] on 01/16/22 at  1:00 PM EDT by a video enabled telemedicine application and verified that I am speaking with the correct person using two identifiers.  Location Provider Location : [facility] Patient Location : Car  Participants: Patient , Provider    I discussed the limitations of evaluation and management by telemedicine and the availability of in person appointments. The patient expressed understanding and agreed to proceed.    I discussed the assessment and treatment plan with the patient. The patient was provided an opportunity to ask questions and all were answered. The patient agreed with the plan and demonstrated an understanding of the instructions.   The patient was advised to call back or seek an in-person evaluation if the symptoms worsen or if the condition fails to improve as anticipated.  [facility] MD OP Progress Note  01/17/2022 1:23 PM SANAE [name]  MRN:  [id number]  Chief Complaint:  Chief Complaint  Patient presents with   Follow-up: 36 year old Caucasian female with history of depression, anxiety, presented for medication management.   HPI: Autumn [name] is a 36 year old Caucasian female, employed, separated, lives in [place], has a history of MDD, other specified anxiety disorder, prediabetes, history of tachycardia, history of bilateral pulmonary embolism on anticoagulation was evaluated by telemedicine today.  Patient today reports she is currently house hunting, trying to buy her new home.  She reports that has been anxiety provoking.  She however otherwise denies any significant anxiety symptoms.  Current medications are beneficial.  Denies any sadness or anhedonia.  Reports sleep as improved however she continues to wake up in the middle of the night since her kitten needs feeding.  She however is able to fall back asleep quickly.  Patient denies any suicidality, homicidality or  perceptual disturbances.  Does report word finding difficulty.  She reports she does have upcoming appointment with neurology for the same.  Denies side effects to her current medications like Lexapro, BuSpar.  Denies any other concerns today.  Visit Diagnosis:    ICD-10-CM   1. MDD (major depressive disorder), recurrent, in full remission ([place])  F33.42 escitalopram (LEXAPRO) 20 MG tablet    2. Other specified anxiety disorders  F41.8    with limited symptom attacks      Past Psychiatric History: Reviewed past psychiatric history from progress note on 02/10/2020.  Past trials of Lexapro, Wellbutrin-noncompliant.  Reviewed family psychiatric history from progress note on 02/10/2020.  Past Medical History:  Past Medical History:  Diagnosis Date   Anxiety    Back pain    chronic   Bilateral pulmonary embolism ([place])    a. 06/2020 following COVID infection.   COVID-19 virus infection 06/2020   Depression    Disc disease, degenerative, lumbar or lumbosacral    History of echocardiogram    a. 06/2020 Echo: EF 55-60%, no rwma, nl RV size/fxn.   History of ovarian cyst    Migraine    Palpitations    a. 2/022 Zio: RSR, 85 (47-112). Rare PACs/PVCs. No significant arrhythmias/prolonged pauses.   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  Family History:  Family History  Problem Relation Age of Onset   Cancer Mother 71       breast   Breast cancer Mother 86       and 40   Alcohol abuse Father     Vision loss Maternal Grandmother    Heart failure Maternal Grandmother    Cancer Maternal Grandfather    Alcohol abuse Paternal Grandfather    Arthritis Maternal Aunt    Anxiety disorder Maternal Aunt    Depression Maternal Aunt     Social History: Reviewed social history from progress note on 02/10/2020. Social History   Socioeconomic History   Marital status: Single    Spouse name: Not on file   Number of children: 1   Years of education: Not on file   Highest education level: Not on file  Occupational History   Occupation: Transport planner  Tobacco Use   Smoking status: Former    Years: 10.00    Types: Cigarettes    Quit date: 08/18/2016    Years since quitting: 5.4   Smokeless tobacco: Never   Tobacco comments:    3 cigarettes a day  Vaping Use   Vaping Use: Never used  Substance and Sexual Activity   Alcohol use: Yes    Alcohol/week: 0.0 standard drinks of alcohol    Comment: occasional   Drug use: No   Sexual activity: Yes    Birth control/protection: Pill  Other Topics Concern   Not on file  Social History Narrative   Right handed   Social Determinants of Health   Financial Resource Strain: Not on file  Food Insecurity: Not on file  Transportation Needs: Not on file  Physical Activity: Not on file  Stress: Not on file  Social Connections: Not on file    Allergies:  Allergies  Allergen Reactions   Imitrex [Sumatriptan] Other (See Comments)    Vomiting, Slurred Speech and Facial Numbness   Gadolinium Derivatives Other (See Comments)    Lip tingling, scratchy throat   Iodinated Contrast Media Itching and Cough    Patient experienced sneezing, throat itching, and "feeling like needing to cough" Given 25 mg benadryl and observed x 20 mins/MMS Per dr Polly Cobia patient should have 13 hour prep   Other Itching    States reaction to contrast dye used for CTA of her brain - itching of tongue, tingling lower lip, coughing    Metabolic Disorder Labs: Lab Results   Component Value Date   HGBA1C 6.1 12/08/2020   No results found for: "PROLACTIN" Lab Results  Component Value Date   CHOL 146 12/08/2020   TRIG 94.0 12/08/2020   HDL 50.80 12/08/2020   CHOLHDL 3 12/08/2020   VLDL 18.8 12/08/2020   LDLCALC 77 12/08/2020   LDLCALC 115 (H) 06/28/2020   Lab Results  Component Value Date   TSH 2.03 12/30/2020   TSH 1.029 07/08/2020    Therapeutic Level Labs: No results found for: "LITHIUM" No results found for: "VALPROATE" No results found for: "CBMZ"  Current Medications: Current Outpatient Medications  Medication Sig Dispense Refill   acetaminophen (TYLENOL) 500 MG tablet Take 1,000 mg by mouth every 6 (six) hours as needed (headaches/migraines/pain).     aspirin 81 MG EC tablet Take by mouth.     busPIRone (BUSPAR) 15 MG tablet Take 1 tablet (  15 mg total) by mouth 2 (two) times daily. Take daily at 7 AM and 1 PM 180 tablet 1   clonazePAM (KLONOPIN) 0.5 MG tablet Take 1 tablet (0.5 mg total) by mouth daily as needed for anxiety. For severe panic attacks 15 tablet 1   valACYclovir (VALTREX) 1000 MG tablet Take 2 g by mouth 2 (two) times daily as needed (fever blisters).     escitalopram (LEXAPRO) 20 MG tablet Take 1 tablet (20 mg total) by mouth daily. 90 tablet 1   No current facility-administered medications for this visit.     Musculoskeletal: Strength & Muscle Tone:  UTA Gait & Station:  Seated Patient leans: N/A  Psychiatric Specialty Exam: Review of Systems  Psychiatric/Behavioral:  Positive for sleep disturbance. The patient is nervous/anxious.   All other systems reviewed and are negative.   There were no vitals taken for this visit.There is no height or weight on file to calculate BMI.  General Appearance: Casual  Eye Contact:  Fair  Speech:  Normal Rate  Volume:  Normal  Mood:  Anxious  Affect:  Congruent  Thought Process:  Goal Directed and Descriptions of Associations: Intact  Orientation:  Full (Time, Place, and  Person)  Thought Content: Logical   Suicidal Thoughts:  No  Homicidal Thoughts:  No  Memory:  Immediate;   Fair Recent;   Fair Remote;   Fair  Judgement:  Fair  Insight:  Fair  Psychomotor Activity:  Normal  Concentration:  Concentration: Fair and Attention Span: Fair  Recall:  [employer] of Knowledge: Fair  Language: Fair  Akathisia:  No  Handed:  Right  AIMS (if indicated): not done  Assets:  Communication Skills Desire for Improvement Housing Social Support  ADL's:  Intact  Cognition: WNL  Sleep:   improving   Screenings: AIMS    Flowsheet Row Video Visit from 11/20/2021 in [place] Total Score 0      GAD-7    Flowsheet Row Video Visit from 11/20/2021 in [place] Video Visit from 06/20/2021 in [place] Office Visit from 05/16/2021 in [place] Video Visit from 04/11/2021 in [place] Video Visit from 03/13/2021 in [place]  Total GAD-7 Score '2 2 7 3 3      '$ PHQ2-9    Flowsheet Row Video Visit from 01/16/2022 in [place] Video Visit from 11/20/2021 in [place] Video Visit from 08/23/2021 in [place] Video Visit from 06/20/2021 in [place] Visit from 05/16/2021 in [place]  PHQ-2 Total Score 0 0 0 1 2  PHQ-9 Total Score -- -- -- -- 6      Flowsheet Row Video Visit from 01/16/2022 in [place] Visit from 05/16/2021 in [place] Video Visit from 01/11/2021 in [place] No Risk No Risk No Risk        Assessment and Plan: VAEDA WESTALL is a 36 year old Caucasian female, employed, lives in [place],  has a history of depression, anxiety was evaluated by telemedicine today.  Patient is currently improving.  Plan as noted below.  Plan MDD in remission Lexapro 20 mg p.o. daily Continue CBT as needed  Anxiety disorder-stable BuSpar 15 mg p.o. twice daily Lexapro 20 mg p.o. daily. Klonopin 0.5 mg as needed for severe anxiety attacks-limiting use. Continue CBT  Follow-up in clinic in 4 months or sooner if needed.   Collaboration of Care: Collaboration of Care: Other patient to follow-up with neurology for her word-finding difficulty.  Has upcoming appointment scheduled.  Patient/Guardian was advised Release of Information must be obtained prior to any record release in order to collaborate their care with an outside provider. Patient/Guardian was advised if they have not already done so to contact the registration department to sign all necessary forms in order for [place] to release information regarding their care.   Consent: Patient/Guardian gives verbal consent for treatment and assignment of benefits for services provided during this visit. Patient/Guardian expressed understanding and agreed to proceed.   This note was generated in part or whole with voice recognition software. Voice recognition is usually quite accurate but there are transcription errors that can and very often do occur. I apologize for any typographical errors that were not detected and corrected.      Ursula Alert, MD 01/17/2022, 1:23 PM

## 2022-01-18 ENCOUNTER — Other Ambulatory Visit (HOSPITAL_COMMUNITY)
Admission: RE | Admit: 2022-01-18 | Discharge: 2022-01-18 | Disposition: A | Discharge status: Home / Self Care | Payer: 59 | Source: Ambulatory Visit | Attending: Primary Care | Admitting: Primary Care

## 2022-01-18 ENCOUNTER — Encounter: Payer: Self-pay | Admitting: Primary Care

## 2022-01-18 ENCOUNTER — Ambulatory Visit (INDEPENDENT_AMBULATORY_CARE_PROVIDER_SITE_OTHER): Payer: 59 | Admitting: Primary Care

## 2022-01-18 VITALS — BP 110/68 | HR 92 | Temp 98.6°F | Ht 70.0 in | Wt 247.0 lb

## 2022-01-18 DIAGNOSIS — F419 Anxiety disorder, unspecified: Secondary | ICD-10-CM

## 2022-01-18 DIAGNOSIS — Z8673 Personal history of transient ischemic attack (TIA), and cerebral infarction without residual deficits: Secondary | ICD-10-CM

## 2022-01-18 DIAGNOSIS — F32A Depression, unspecified: Secondary | ICD-10-CM | POA: Diagnosis not present

## 2022-01-18 DIAGNOSIS — Z0000 Encounter for general adult medical examination without abnormal findings: Secondary | ICD-10-CM | POA: Diagnosis not present

## 2022-01-18 DIAGNOSIS — E538 Deficiency of other specified B group vitamins: Secondary | ICD-10-CM | POA: Diagnosis not present

## 2022-01-18 DIAGNOSIS — R7989 Other specified abnormal findings of blood chemistry: Secondary | ICD-10-CM

## 2022-01-18 DIAGNOSIS — Z124 Encounter for screening for malignant neoplasm of cervix: Secondary | ICD-10-CM

## 2022-01-18 DIAGNOSIS — R7303 Prediabetes: Secondary | ICD-10-CM | POA: Diagnosis not present

## 2022-01-18 DIAGNOSIS — B009 Herpesviral infection, unspecified: Secondary | ICD-10-CM

## 2022-01-18 NOTE — Assessment & Plan Note (Signed)
Not taking supplementation. Repeat B12 level pending.

## 2022-01-18 NOTE — Assessment & Plan Note (Signed)
Controlled.  Infrequent outbreaks.  Continue Valtrex 2 g PRN.

## 2022-01-18 NOTE — Assessment & Plan Note (Signed)
Immunizations UTD. Pap smear due, completed today.  Discussed the importance of a healthy diet and regular exercise in order for weight loss, and to reduce the risk of further co-morbidity.  Exam stable. Labs pending.  Follow up in 1 year for repeat physical.  

## 2022-01-18 NOTE — Assessment & Plan Note (Signed)
Repeat A1C pending. Discussed to work on diet. Start exercising.

## 2022-01-18 NOTE — Patient Instructions (Signed)
Stop by the lab prior to leaving today. I will notify you of your results once received.   It was a pleasure to see you today!  Preventive Care 21-36 Years Old, Female Preventive care refers to lifestyle choices and visits with your health care provider that can promote health and wellness. Preventive care visits are also called wellness exams. What can I expect for my preventive care visit? Counseling During your preventive care visit, your health care provider may ask about your: Medical history, including: Past medical problems. Family medical history. Pregnancy history. Current health, including: Menstrual cycle. Method of birth control. Emotional well-being. Home life and relationship well-being. Sexual activity and sexual health. Lifestyle, including: Alcohol, nicotine or tobacco, and drug use. Access to firearms. Diet, exercise, and sleep habits. Work and work environment. Sunscreen use. Safety issues such as seatbelt and bike helmet use. Physical exam Your health care provider may check your: Height and weight. These may be used to calculate your BMI (body mass index). BMI is a measurement that tells if you are at a healthy weight. Waist circumference. This measures the distance around your waistline. This measurement also tells if you are at a healthy weight and may help predict your risk of certain diseases, such as type 2 diabetes and high blood pressure. Heart rate and blood pressure. Body temperature. Skin for abnormal spots. What immunizations do I need?  Vaccines are usually given at various ages, according to a schedule. Your health care provider will recommend vaccines for you based on your age, medical history, and lifestyle or other factors, such as travel or where you work. What tests do I need? Screening Your health care provider may recommend screening tests for certain conditions. This may include: Pelvic exam and Pap test. Lipid and cholesterol  levels. Diabetes screening. This is done by checking your blood sugar (glucose) after you have not eaten for a while (fasting). Hepatitis B test. Hepatitis C test. HIV (human immunodeficiency virus) test. STI (sexually transmitted infection) testing, if you are at risk. BRCA-related cancer screening. This may be done if you have a family history of breast, ovarian, tubal, or peritoneal cancers. Talk with your health care provider about your test results, treatment options, and if necessary, the need for more tests. Follow these instructions at home: Eating and drinking  Eat a healthy diet that includes fresh fruits and vegetables, whole grains, lean protein, and low-fat dairy products. Take vitamin and mineral supplements as recommended by your health care provider. Do not drink alcohol if: Your health care provider tells you not to drink. You are pregnant, may be pregnant, or are planning to become pregnant. If you drink alcohol: Limit how much you have to 0-1 drink a day. Know how much alcohol is in your drink. In the [place] one drink equals one 12 oz bottle of beer (355 mL), one 5 oz glass of wine (148 mL), or one 1 oz glass of hard liquor (44 mL). Lifestyle Brush your teeth every morning and night with fluoride toothpaste. Floss one time each day. Exercise for at least 30 minutes 5 or more days each week. Do not use any products that contain nicotine or tobacco. These products include cigarettes, chewing tobacco, and vaping devices, such as e-cigarettes. If you need help quitting, ask your health care provider. Do not use drugs. If you are sexually active, practice safe sex. Use a condom or other form of protection to prevent STIs. If you do not wish to become pregnant, use a   form of birth control. If you plan to become pregnant, see your health care provider for a prepregnancy visit. Find healthy ways to manage stress, such as: Meditation, yoga, or listening to  music. Journaling. Talking to a trusted person. Spending time with friends and family. Minimize exposure to UV radiation to reduce your risk of skin cancer. Safety Always wear your seat belt while driving or riding in a vehicle. Do not drive: If you have been drinking alcohol. Do not ride with someone who has been drinking. If you have been using any mind-altering substances or drugs. While texting. When you are tired or distracted. Wear a helmet and other protective equipment during sports activities. If you have firearms in your house, make sure you follow all gun safety procedures. Seek help if you have been physically or sexually abused. What's next? Go to your health care provider once a year for an annual wellness visit. Ask your health care provider how often you should have your eyes and teeth checked. Stay up to date on all vaccines. This information is not intended to replace advice given to you by your health care provider. Make sure you discuss any questions you have with your health care provider. Document Revised: 11/23/2020 Document Reviewed: 11/23/2020 Elsevier Patient Education  [place].

## 2022-01-18 NOTE — Assessment & Plan Note (Signed)
Following with neurology, due for follow up soon.  Continued, intermittent symptoms with word finding. She will update her neurologist soon.

## 2022-01-18 NOTE — Addendum Note (Signed)
Addended by: Ellamae Sia on: 01/18/2022 12:16 PM   Modules accepted: Orders

## 2022-01-18 NOTE — Assessment & Plan Note (Signed)
Stable.  Following with psychiatry, office notes reviewed from August 2023. Continue Lexapro 20 mg daily, buspirone 15 mg BID.

## 2022-01-18 NOTE — Progress Notes (Signed)
Subjective:    Patient ID: [name], female    DOB: [birth year], 36 y.o.   MRN: [id number]  HPI  TRINA ASCH is a very pleasant 36 y.o. female who presents today for complete physical and follow up of chronic conditions.  Immunizations: -Tetanus: 2020 -Influenza: Due this season  -Covid-19: 2 vaccines   Diet: Fair diet.  Exercise: No regular exercise.  Eye exam: Completes annually  Dental exam: Completes semi-annually   Pap Smear: Completed in June 2020  BP Readings from Last 3 Encounters:  01/18/22 110/68  07/18/21 115/84  05/25/21 110/68      Review of Systems  Constitutional:  Negative for unexpected weight change.  HENT:  Negative for rhinorrhea.   Respiratory:  Negative for cough and shortness of breath.   Cardiovascular:  Negative for chest pain.  Gastrointestinal:  Negative for constipation and diarrhea.  Genitourinary:  Negative for difficulty urinating and menstrual problem.  Musculoskeletal:  Negative for arthralgias and myalgias.  Skin:  Negative for rash.  Allergic/Immunologic: Negative for environmental allergies.  Neurological:  Negative for dizziness and headaches.       Difficulty with word finding at times  Psychiatric/Behavioral:  The patient is nervous/anxious.          Past Medical History:  Diagnosis Date   Anxiety    Back pain    chronic   Bilateral pulmonary embolism ([place])    a. 06/2020 following COVID infection.   COVID-19 virus infection 06/2020   Depression    Disc disease, degenerative, lumbar or lumbosacral    History of echocardiogram    a. 06/2020 Echo: EF 55-60%, no rwma, nl RV size/fxn.   History of ovarian cyst    Imbalance 10/25/2020   Lacunar stroke ([place]) 12/08/2020   Lightheadedness 05/25/2021   Migraine    Nonallopathic lesion of cervical region 04/16/2018   Nonallopathic lesion of rib cage 04/16/2018   Nonallopathic lesion of thoracic region 04/16/2018   OME (otitis media with effusion), right  12/06/2020   Palpitations    a. 2/022 Zio: RSR, 85 (47-112). Rare PACs/PVCs. No significant arrhythmias/prolonged pauses.   Paresthesia 06/30/2019   Prediabetes    Scoliosis    Shingles 06/20/2018   Stroke ([place]) 2022   Remote lacunar strokes noted in the cerebellum by MRI    Social History   Socioeconomic History   Marital status: Single    Spouse name: Not on file   Number of children: 1   Years of education: Not on file   Highest education level: Not on file  Occupational History   Occupation: care manager  Tobacco Use   Smoking status: Former    Years: 10.00    Types: Cigarettes    Quit date: 08/18/2016    Years since quitting: 5.4   Smokeless tobacco: Never   Tobacco comments:    3 cigarettes a day  Vaping Use   Vaping Use: Never used  Substance and Sexual Activity   Alcohol use: Yes    Alcohol/week: 0.0 standard drinks of alcohol    Comment: occasional   Drug use: No   Sexual activity: Yes    Birth control/protection: Pill  Other Topics Concern   Not on file  Social History Narrative   Right handed   Social Determinants of Health   Financial Resource Strain: Not on file  Food Insecurity: Not on file  Transportation Needs: Not on file  Physical Activity: Not on file  Stress: Not on file  Social  Connections: Not on file  Intimate Partner Violence: Not on file    Past Surgical History:  Procedure Laterality Date   APPENDECTOMY     BREAST SURGERY Right 2008   lump/ benign   TEE WITHOUT CARDIOVERSION N/A 12/13/2020   Procedure: TRANSESOPHAGEAL ECHOCARDIOGRAM (TEE);  Surgeon: Nelva Bush, MD;  Location: [facility] ORS;  Service: Cardiovascular;  Laterality: N/A;    Family History  Problem Relation Age of Onset   Cancer Mother 73       breast   Breast cancer Mother 42       and 90   Alcohol abuse Father    Vision loss Maternal Grandmother    Heart failure Maternal Grandmother    Cancer Maternal Grandfather    Alcohol abuse Paternal Grandfather     Arthritis Maternal Aunt    Anxiety disorder Maternal Aunt    Depression Maternal Aunt     Allergies  Allergen Reactions   Imitrex [Sumatriptan] Other (See Comments)    Vomiting, Slurred Speech and Facial Numbness   Gadolinium Derivatives Other (See Comments)    Lip tingling, scratchy throat   Iodinated Contrast Media Itching and Cough    Patient experienced sneezing, throat itching, and "feeling like needing to cough" Given 25 mg benadryl and observed x 20 mins/MMS Per dr Polly Cobia patient should have 13 hour prep   Other Itching    States reaction to contrast dye used for CTA of her brain - itching of tongue, tingling lower lip, coughing    Current Outpatient Medications on File Prior to Visit  Medication Sig Dispense Refill   acetaminophen (TYLENOL) 500 MG tablet Take 1,000 mg by mouth every 6 (six) hours as needed (headaches/migraines/pain).     aspirin 81 MG EC tablet Take by mouth.     busPIRone (BUSPAR) 15 MG tablet Take 1 tablet (15 mg total) by mouth 2 (two) times daily. Take daily at 7 AM and 1 PM 180 tablet 1   clonazePAM (KLONOPIN) 0.5 MG tablet Take 1 tablet (0.5 mg total) by mouth daily as needed for anxiety. For severe panic attacks 15 tablet 1   escitalopram (LEXAPRO) 20 MG tablet Take 1 tablet (20 mg total) by mouth daily. 90 tablet 1   valACYclovir (VALTREX) 1000 MG tablet Take 2 g by mouth 2 (two) times daily as needed (fever blisters).     No current facility-administered medications on file prior to visit.    BP 110/68   Pulse 92   Temp 98.6 F (37 C) (Oral)   Ht '5\' 10"'$  (1.778 m)   Wt 254 lb (115.2 kg)   SpO2 98%   BMI 36.45 kg/m  Objective:   Physical Exam HENT:     Right Ear: Tympanic membrane and ear canal normal.     Left Ear: Tympanic membrane and ear canal normal.     Nose: Nose normal.  Eyes:     Conjunctiva/sclera: Conjunctivae normal.     Pupils: Pupils are equal, round, and reactive to light.  Neck:     Thyroid: No thyromegaly.   Cardiovascular:     Rate and Rhythm: Normal rate and regular rhythm.     Heart sounds: No murmur heard. Pulmonary:     Effort: Pulmonary effort is normal.     Breath sounds: Normal breath sounds. No rales.  Abdominal:     General: Bowel sounds are normal.     Palpations: Abdomen is soft.     Tenderness: There is no abdominal tenderness.  Genitourinary:  Labia:        Right: No tenderness or lesion.        Left: No tenderness or lesion.      Vagina: Normal.     Cervix: No cervical motion tenderness or discharge.     Uterus: Normal.      Adnexa: Right adnexa normal and left adnexa normal.  Musculoskeletal:        General: Normal range of motion.     Cervical back: Neck supple.  Lymphadenopathy:     Cervical: No cervical adenopathy.  Skin:    General: Skin is warm and dry.     Findings: No rash.  Neurological:     Mental Status: She is alert and oriented to person, place, and time.     Cranial Nerves: No cranial nerve deficit.     Deep Tendon Reflexes: Reflexes are normal and symmetric.           Assessment & Plan:   Problem List Items Addressed This Visit       Other   Preventative health care - Primary (Chronic)    Immunizations UTD. Pap smear due, completed today.  Discussed the importance of a healthy diet and regular exercise in order for weight loss, and to reduce the risk of further co-morbidity.  Exam stable. Labs pending.  Follow up in 1 year for repeat physical.       Herpes simplex virus (HSV) infection    Controlled.  Infrequent outbreaks.  Continue Valtrex 2 g PRN.      Anxiety and depression    Stable.  Following with psychiatry, office notes reviewed from August 2023. Continue Lexapro 20 mg daily, buspirone 15 mg BID.        Prediabetes    Repeat A1C pending. Discussed to work on diet. Start exercising.       Relevant Orders   Lipid panel   Hemoglobin A1c   Comprehensive metabolic panel   History of cryptogenic stroke     Following with neurology, due for follow up soon.  Continued, intermittent symptoms with word finding. She will update her neurologist soon.       Low vitamin B12 level    Not taking supplementation. Repeat B12 level pending.       Relevant Orders   Vitamin B12   VITAMIN D 25 Hydroxy (Vit-D Deficiency, Fractures)   Other Visit Diagnoses     Screening for cervical cancer       Relevant Orders   Cytology - PAP          Pleas Koch, NP

## 2022-01-19 ENCOUNTER — Other Ambulatory Visit (INDEPENDENT_AMBULATORY_CARE_PROVIDER_SITE_OTHER): Payer: 59

## 2022-01-19 DIAGNOSIS — R7989 Other specified abnormal findings of blood chemistry: Secondary | ICD-10-CM

## 2022-01-19 DIAGNOSIS — R7303 Prediabetes: Secondary | ICD-10-CM | POA: Diagnosis not present

## 2022-01-19 DIAGNOSIS — E538 Deficiency of other specified B group vitamins: Secondary | ICD-10-CM | POA: Diagnosis not present

## 2022-01-19 LAB — COMPREHENSIVE METABOLIC PANEL
ALT: 31 U/L (ref 0–35)
AST: 18 U/L (ref 0–37)
Albumin: 4.3 g/dL (ref 3.5–5.2)
Alkaline Phosphatase: 58 U/L (ref 39–117)
BUN: 12 mg/dL (ref 6–23)
CO2: 27 mEq/L (ref 19–32)
Calcium: 9.2 mg/dL (ref 8.4–10.5)
Chloride: 103 mEq/L (ref 96–112)
Creatinine, Ser: 0.81 mg/dL (ref 0.40–1.20)
GFR: 93.75 mL/min (ref >60.00)
Glucose, Bld: 101 mg/dL — ABNORMAL HIGH (ref 70–99)
Potassium: 4.3 mEq/L (ref 3.5–5.1)
Sodium: 138 mEq/L (ref 135–145)
Total Bilirubin: 0.5 mg/dL (ref 0.2–1.2)
Total Protein: 6.9 g/dL (ref 6.0–8.3)

## 2022-01-19 LAB — VITAMIN D 25 HYDROXY (VIT D DEFICIENCY, FRACTURES): VITD: 19.34 ng/mL — ABNORMAL LOW (ref 30.00–100.00)

## 2022-01-19 LAB — LIPID PANEL
Cholesterol: 150 mg/dL (ref 0–200)
HDL: 45.8 mg/dL (ref >39.00)
LDL Cholesterol: 77 mg/dL (ref 0–99)
NonHDL: 104.49
Total CHOL/HDL Ratio: 3
Triglycerides: 138 mg/dL (ref 0.0–149.0)
VLDL: 27.6 mg/dL (ref 0.0–40.0)

## 2022-01-19 LAB — HEMOGLOBIN A1C: Hgb A1c MFr Bld: 6.1 % (ref 4.6–6.5)

## 2022-01-19 LAB — VITAMIN B12: Vitamin B-12: 484 pg/mL (ref 211–911)

## 2022-01-23 LAB — CYTOLOGY - PAP
Comment: NEGATIVE
Diagnosis: NEGATIVE
High risk HPV: NEGATIVE

## 2022-01-25 ENCOUNTER — Other Ambulatory Visit: Payer: Self-pay

## 2022-01-29 NOTE — Progress Notes (Unsigned)
NEUROLOGY FOLLOW UP OFFICE NOTE  Autumn [name] [id number]  Assessment/Plan:    Ataxia.  MRI of brain showed findings concerning for remote small infarcts within the bilateral cerebellar hemispheres.  Embolic stroke due to hypercoagulable state following COVID is possible.   Subjective memory complaints - highly unlikely to be neurologic in etiology.  Prior brain MRIs have not shown any possible cause.  Possibly related to underlying stress Small cerebral aneurysm vs infundibulum not seen on most recent MRA Intermittent left sided facial numbness.  Unclear etiology   Continue ASA '81mg'$  daily Follow up as needed.     Subjective:  Autumn [name] is a 36 year old right-handed female with migraines, DDD of lumbar spine, depression and anxiety and history of bilateral PE following COVID infection who follows up for ataxia   UPDATE: Current medications:  ASA '81mg'$   At last visit in February, she endorsed intermittent episodes of paresthesias radiating up the left side of neck and face lasting a few seconds.  MRI of brain on 07/24/2021 personally reviewed demonstrated stable tiny bilateral cerebellar infarcts.  MRA of head showed no visible aneurysm.  The paresthesias stopped.    She now reports some memory problems.  She may forget conversations.  Other times she may have word-finding difficulty or problems getting words out that she knows.  She does report stress related to being busy at work.  Her thyroid levels are regularly checked.  B12 level just checked was in the 400s.  Vit D level was 19 and started on supplements.   HISTORY: She had COVID in December 2021.  She had multiple bilateral PEs in January and was started on Eliquis.  Hypercoagulable workup was negative.  She also had palpitations.  Cardiac event monitor in January-February 2022 was negative.  Around March 2022, she started having balance problems.  She may suddenly feel herself leaning towards the right.  While  walking, she may start veering towards the right.  Sometimes if she bends over to pick something up, she may start leaning over to the right.  When she parks the car, she may park it close to or a little over the right line, but she doesn't veer while driving.  This doesn't happen all of the time.  No associated dizziness/vertigo.  If she stares or concentrates on something, such as looking at the computer, the image may slightly cross.  She denies difficulty with depth perception or reaching for objects with either hand.  Denies hearing loss, tinnitus, aural fullness.  On three separate occasions, she feels a sensation beginning from back of neck and spreading over her head to the front, lasting 2 to 3 seconds.  Afterwards, her vision was not focused, lasting several seconds.  On one occasion, she felt dizzy and nauseous for 45 minutes.  These events are different than her migraines, which are right temporal/retro-orbital pain with some nausea, occurs infrequently (only one since beginning in the year).  Sometimes she feels she has to focus more when she looks.  Eye exam was normal.  MRI of brain with and without contrast on 12/01/2020 showed what was read as tiny remote lacunar infarcts in the bilateral cerebellar hemispheres.  CTA head and neck on 12/26/2020 showed no LVO or hemodynamically significant stenosis but did demonstrate a 1 mm infundibulum vs aneurysm arising from the supraclinoid left ICA.  TEE on 12/13/2020 was negative.    She is not aware of any family history of neurologic conditions.    PAST  MEDICAL HISTORY: Past Medical History:  Diagnosis Date   Anxiety    Back pain    chronic   Bilateral pulmonary embolism ([place])    a. 06/2020 following COVID infection.   COVID-19 virus infection 06/2020   Depression    Disc disease, degenerative, lumbar or lumbosacral    History of echocardiogram    a. 06/2020 Echo: EF 55-60%, no rwma, nl RV size/fxn.   History of ovarian cyst    Imbalance  10/25/2020   Lacunar stroke ([place]) 12/08/2020   Lightheadedness 05/25/2021   Migraine    Nonallopathic lesion of cervical region 04/16/2018   Nonallopathic lesion of rib cage 04/16/2018   Nonallopathic lesion of thoracic region 04/16/2018   OME (otitis media with effusion), right 12/06/2020   Palpitations    a. 2/022 Zio: RSR, 85 (47-112). Rare PACs/PVCs. No significant arrhythmias/prolonged pauses.   Paresthesia 06/30/2019   Prediabetes    Scoliosis    Shingles 06/20/2018   Stroke ([place]) 2022   Remote lacunar strokes noted in the cerebellum by MRI    MEDICATIONS: Current Outpatient Medications on File Prior to Visit  Medication Sig Dispense Refill   acetaminophen (TYLENOL) 500 MG tablet Take 1,000 mg by mouth every 6 (six) hours as needed (headaches/migraines/pain).     aspirin 81 MG EC tablet Take by mouth.     busPIRone (BUSPAR) 15 MG tablet Take 1 tablet (15 mg total) by mouth 2 (two) times daily. Take daily at 7 AM and 1 PM 180 tablet 1   clonazePAM (KLONOPIN) 0.5 MG tablet Take 1 tablet (0.5 mg total) by mouth daily as needed for anxiety. For severe panic attacks 15 tablet 1   escitalopram (LEXAPRO) 20 MG tablet Take 1 tablet (20 mg total) by mouth daily. 90 tablet 1   valACYclovir (VALTREX) 1000 MG tablet Take 2 g by mouth 2 (two) times daily as needed (fever blisters).     No current facility-administered medications on file prior to visit.    ALLERGIES: Allergies  Allergen Reactions   Imitrex [Sumatriptan] Other (See Comments)    Vomiting, Slurred Speech and Facial Numbness   Gadolinium Derivatives Other (See Comments)    Lip tingling, scratchy throat   Iodinated Contrast Media Itching and Cough    Patient experienced sneezing, throat itching, and "feeling like needing to cough" Given 25 mg benadryl and observed x 20 mins/MMS Per dr Polly Cobia patient should have 13 hour prep   Other Itching    States reaction to contrast dye used for CTA of her brain - itching of tongue,  tingling lower lip, coughing    FAMILY HISTORY: Family History  Problem Relation Age of Onset   Cancer Mother 42       breast   Breast cancer Mother 71       and 75   Alcohol abuse Father    Vision loss Maternal Grandmother    Heart failure Maternal Grandmother    Cancer Maternal Grandfather    Alcohol abuse Paternal Grandfather    Arthritis Maternal Aunt    Anxiety disorder Maternal Aunt    Depression Maternal Aunt       Objective:  Blood pressure 112/77, pulse (!) 106, height '5\' 9"'$  (1.753 m), weight 249 lb 3.2 oz (113 kg), SpO2 95 %. General: No acute distress.  Patient appears well-groomed.   Head:  Normocephalic/atraumatic Eyes:  Fundi examined but not visualized Neck: supple, no paraspinal tenderness, full range of motion Heart:  Regular rate and rhythm Neurological Exam:  alert and oriented to person, place, and time.  Speech fluent and not dysarthric, language intact.  CN II-XII intact. Bulk and tone normal, muscle strength 5/5 throughout.  Sensation to light touch intact.  Deep tendon reflexes 2+ throughout.  Finger to nose testing intact.  Gait normal, Romberg negative.   Metta Clines, DO  CC: Alma Friendly, NP

## 2022-01-30 ENCOUNTER — Encounter: Payer: Self-pay | Admitting: Neurology

## 2022-01-30 ENCOUNTER — Ambulatory Visit: Payer: 59 | Admitting: Neurology

## 2022-01-30 VITALS — BP 112/77 | HR 106 | Ht 69.0 in | Wt 249.2 lb

## 2022-01-30 DIAGNOSIS — I639 Cerebral infarction, unspecified: Secondary | ICD-10-CM | POA: Diagnosis not present

## 2022-01-30 DIAGNOSIS — R200 Anesthesia of skin: Secondary | ICD-10-CM | POA: Diagnosis not present

## 2022-01-30 DIAGNOSIS — R4189 Other symptoms and signs involving cognitive functions and awareness: Secondary | ICD-10-CM | POA: Diagnosis not present

## 2022-02-09 ENCOUNTER — Other Ambulatory Visit: Payer: Self-pay

## 2022-03-05 ENCOUNTER — Other Ambulatory Visit: Payer: Self-pay

## 2022-03-05 ENCOUNTER — Encounter: Payer: Self-pay | Admitting: Family Medicine

## 2022-03-05 ENCOUNTER — Ambulatory Visit (INDEPENDENT_AMBULATORY_CARE_PROVIDER_SITE_OTHER): Payer: 59 | Admitting: Family Medicine

## 2022-03-05 VITALS — BP 100/66 | HR 69 | Temp 97.5°F | Ht 69.75 in | Wt 248.0 lb

## 2022-03-05 DIAGNOSIS — B029 Zoster without complications: Secondary | ICD-10-CM | POA: Diagnosis not present

## 2022-03-05 MED ORDER — VALACYCLOVIR HCL 1 G PO TABS
1000.0000 mg | ORAL_TABLET | Freq: Three times a day (TID) | ORAL | 0 refills | Status: AC
  Filled 2022-03-05: qty 21, 7d supply, fill #0

## 2022-03-05 NOTE — Progress Notes (Signed)
   Subjective:     Autumn [name] is a 37 y.o. female presenting for Rash (Behind R ear x 1.5 days)     Rash    #Rash - behind the right ear  - x 1.5 days - not itchy - was itchy yesterday - now hot and stinging in the shower - no drainage - just bumpy - no hx of psoriasis or eczema  Hx of shingles on the left buttock  Review of Systems  Skin:  Positive for rash.     Social History   Tobacco Use  Smoking Status Former   Years: 10.00   Types: Cigarettes   Quit date: 08/18/2016   Years since quitting: 5.5  Smokeless Tobacco Never  Tobacco Comments   3 cigarettes a day        Objective:    BP Readings from Last 3 Encounters:  03/05/22 100/66  01/30/22 112/77  01/18/22 110/68   Wt Readings from Last 3 Encounters:  03/05/22 248 lb (112.5 kg)  01/30/22 249 lb 3.2 oz (113 kg)  01/18/22 247 lb (112 kg)    BP 100/66   Pulse 69   Temp (!) 97.5 F (36.4 C) (Temporal)   Ht 5' 9.75" (1.772 m)   Wt 248 lb (112.5 kg)   LMP 02/28/2022 (Exact Date)   SpO2 99%   BMI 35.84 kg/m    Physical Exam Constitutional:      General: She is not in acute distress.    Appearance: She is well-developed. She is not diaphoretic.  HENT:     Head: Normocephalic and atraumatic.     Right Ear: Tympanic membrane and external ear normal.     Left Ear: External ear normal.     Nose: Nose normal.  Eyes:     Conjunctiva/sclera: Conjunctivae normal.  Cardiovascular:     Rate and Rhythm: Normal rate.  Pulmonary:     Effort: Pulmonary effort is normal.  Musculoskeletal:     Cervical back: Neck supple.  Skin:    General: Skin is warm and dry.     Capillary Refill: Capillary refill takes less than 2 seconds.     Comments: Papular-vesicular rash with erythematous base behind the right ear. TTP over vesicular lesions  Neurological:     Mental Status: She is alert. Mental status is at baseline.  Psychiatric:        Mood and Affect: Mood normal.        Behavior:  Behavior normal.           Assessment & Plan:   Problem List Items Addressed This Visit       Other   Herpes zoster without complication - Primary    3rd episode per patient. No known triggers apart from stress. No known immunosuppression. At this point will treat as shingles, however, could consider zosteriform HSV given hx of prior and may be a candidate for suppressive therapy in the future.       Relevant Medications   valACYclovir (VALTREX) 1000 MG tablet     Return if symptoms worsen or fail to improve.  Autumn Noe, MD

## 2022-03-05 NOTE — Assessment & Plan Note (Signed)
3rd episode per patient. No known triggers apart from stress. No known immunosuppression. At this point will treat as shingles, however, could consider zosteriform HSV given hx of prior and may be a candidate for suppressive therapy in the future.

## 2022-04-24 NOTE — Progress Notes (Unsigned)
    Autumn Kunkler T. [name], MD, [place] [facility] [name], [id number]  Phone: [phone]  FAX: [phone]  Autumn [name] - 36 y.o. female  MRN [id number]  Date of Birth: 26-Sep-1985  Date: 04/25/2022  PCP: Pleas Koch, NP  Referral: Pleas Koch, NP  No chief complaint on file.  Subjective:   Autumn [name] is a 36 y.o. very pleasant female patient with There is no height or weight on file to calculate BMI. who presents with the following:  Pleasant, well-known patient who presents with acute ankle pain.     Review of Systems is noted in the HPI, as appropriate  Objective:   There were no vitals taken for this visit.  GEN: No acute distress; alert,appropriate. PULM: Breathing comfortably in no respiratory distress PSYCH: Normally interactive.   Laboratory and Imaging Data:  Assessment and Plan:   ***

## 2022-04-25 ENCOUNTER — Encounter: Payer: Self-pay | Admitting: Family Medicine

## 2022-04-25 ENCOUNTER — Ambulatory Visit: Payer: 59 | Admitting: Family Medicine

## 2022-04-25 ENCOUNTER — Ambulatory Visit (INDEPENDENT_AMBULATORY_CARE_PROVIDER_SITE_OTHER): Payer: 59

## 2022-04-25 VITALS — BP 100/70 | HR 77 | Temp 98.1°F | Ht 70.0 in | Wt 245.0 lb

## 2022-04-25 DIAGNOSIS — M25572 Pain in left ankle and joints of left foot: Secondary | ICD-10-CM | POA: Diagnosis not present

## 2022-04-25 DIAGNOSIS — S93492A Sprain of other ligament of left ankle, initial encounter: Secondary | ICD-10-CM | POA: Diagnosis not present

## 2022-07-09 ENCOUNTER — Other Ambulatory Visit: Payer: Self-pay

## 2022-07-20 ENCOUNTER — Ambulatory Visit: Payer: Commercial Managed Care - PPO | Admitting: Primary Care

## 2022-07-20 ENCOUNTER — Encounter: Payer: Self-pay | Admitting: Primary Care

## 2022-07-20 ENCOUNTER — Other Ambulatory Visit: Payer: Self-pay

## 2022-07-20 VITALS — BP 102/64 | HR 90 | Temp 98.8°F | Ht 70.0 in | Wt 240.0 lb

## 2022-07-20 DIAGNOSIS — E6609 Other obesity due to excess calories: Secondary | ICD-10-CM

## 2022-07-20 DIAGNOSIS — Z6834 Body mass index (BMI) 34.0-34.9, adult: Secondary | ICD-10-CM

## 2022-07-20 DIAGNOSIS — R21 Rash and other nonspecific skin eruption: Secondary | ICD-10-CM

## 2022-07-20 DIAGNOSIS — R6882 Decreased libido: Secondary | ICD-10-CM

## 2022-07-20 MED ORDER — TRIAMCINOLONE ACETONIDE 0.1 % EX CREA
1.0000 | TOPICAL_CREAM | Freq: Two times a day (BID) | CUTANEOUS | 0 refills | Status: DC | PRN
  Filled 2022-07-20: qty 30, 15d supply, fill #0

## 2022-07-20 NOTE — Assessment & Plan Note (Signed)
Discussion today regarding options.  Recommended Noom or weight watches. Lexapro could be contributing but she's done so well. Not a candidate for phentermine given prior stroke.   Consider GLP 1 agonists. She will continue to work on lifestyle changes and update.

## 2022-07-20 NOTE — Assessment & Plan Note (Signed)
Etiology unclear. Doesn't appear to be Rosacea. It is not herpes zoster.  Trial of triamcinolone 0.1% cream provided to use daily PRN. She will set up a follow up visit with dermatology.

## 2022-07-20 NOTE — Patient Instructions (Signed)
Apply the triamcinolone cream once daily for the rash.  I will be in touch regarding the Androgel.  Consider Noom or Weight Watchers for weight loss.  It was a pleasure to see you today!

## 2022-07-20 NOTE — Assessment & Plan Note (Signed)
Could be SSRI side effect, although she is doing quite well on this regimen.  Will consult with Urology for options. Consider Androgel.

## 2022-07-20 NOTE — Progress Notes (Signed)
Subjective:    Patient ID: Autumn [name], female    DOB: Sep 07, 1985, 37 y.o.   MRN: [id number]  Rash Pertinent negatives include no shortness of breath.    Autumn [name] is a very pleasant 37 y.o. female with a history of migraines, HSV, anxiety and depression, prediabetes, CVA, palpitations who presents today to discuss multiple concerns.  1) Decreased Libido: Chronic for years. Has no interest in sexual activity with her prior husband and with her current partner. This decreased libido began right around when she began her SSRI/depression medications. She's tried weaning off her Lexapro which was not good for her mood.   She was speaking with her Urology college who requesting Androgel. She would like to try.   2) Rash: Chronic to the right cheek and under her chin which began about 8 months ago. Her rash is constantly present which she describes as rough.  She denies itching, swelling, changes in meds/makeup up/facial cleansers/soaps/food/supplements.   3) Class 1 Obesity Due to Excess Calories: Chronic for years, struggles with food cravings and portion sizes. She recently began working out at the gym several days weekly. She thinks about food constantly unless she's really busy. She tries to make healthier choices but her portion sizes are large because she always feels hungry and craves sweets.   She does not track calories. She was once managed on phentermine but this caused palpitations and odd dreams.   She is managed on Lexapro, has been taking for years.   Wt Readings from Last 3 Encounters:  07/20/22 240 lb (108.9 kg)  04/25/22 245 lb (111.1 kg)  03/05/22 248 lb (112.5 kg)     Body mass index is 34.44 kg/m.    Review of Systems  Respiratory:  Negative for shortness of breath.   Cardiovascular:  Negative for chest pain.  Genitourinary:        Decreased libido   Skin:  Positive for rash.  Psychiatric/Behavioral:  The patient is not nervous/anxious.           Past Medical History:  Diagnosis Date   Anxiety    Back pain    chronic   Bilateral pulmonary embolism ([place])    a. 06/2020 following COVID infection.   COVID-19 virus infection 06/2020   Depression    Disc disease, degenerative, lumbar or lumbosacral    Herpes zoster without complication [id number]   History of echocardiogram    a. 06/2020 Echo: EF 55-60%, no rwma, nl RV size/fxn.   History of ovarian cyst    Imbalance 10/25/2020   Lacunar stroke ([place]) 12/08/2020   Left upper quadrant abdominal pain 10/23/2019   Lightheadedness 05/25/2021   Migraine    Nonallopathic lesion of cervical region 04/16/2018   Nonallopathic lesion of rib cage 04/16/2018   Nonallopathic lesion of thoracic region 04/16/2018   OME (otitis media with effusion), right 12/06/2020   Palpitations    a. 2/022 Zio: RSR, 85 (47-112). Rare PACs/PVCs. No significant arrhythmias/prolonged pauses.   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    Social History   Socioeconomic History   Marital status: Single    Spouse name: Not on file   Number of children: 1   Years of education: Not on file   Highest education level: Not on file  Occupational History   Occupation: Transport planner  Tobacco Use  Smoking status: Former    Years: 10.00    Types: Cigarettes    Quit date: 08/18/2016    Years since quitting: 5.9   Smokeless tobacco: Never   Tobacco comments:    3 cigarettes a day  Vaping Use   Vaping Use: Never used  Substance and Sexual Activity   Alcohol use: Yes    Alcohol/week: 0.0 standard drinks of alcohol    Comment: occasional   Drug use: No   Sexual activity: Yes    Birth control/protection: Pill  Other Topics Concern   Not on file  Social History Narrative   Right handed   Social Determinants of Health   Financial Resource Strain: Not on file  Food Insecurity: Not on  file  Transportation Needs: Not on file  Physical Activity: Not on file  Stress: Not on file  Social Connections: Not on file  Intimate Partner Violence: Not on file    Past Surgical History:  Procedure Laterality Date   APPENDECTOMY     BREAST SURGERY Right 2008   lump/ benign   TEE WITHOUT CARDIOVERSION N/A 12/13/2020   Procedure: TRANSESOPHAGEAL ECHOCARDIOGRAM (TEE);  Surgeon: Nelva Bush, MD;  Location: [facility] ORS;  Service: Cardiovascular;  Laterality: N/A;    Family History  Problem Relation Age of Onset   Cancer Mother 54       breast   Breast cancer Mother 60       and 25   Alcohol abuse Father    Vision loss Maternal Grandmother    Heart failure Maternal Grandmother    Cancer Maternal Grandfather    Alcohol abuse Paternal Grandfather    Arthritis Maternal Aunt    Anxiety disorder Maternal Aunt    Depression Maternal Aunt     Allergies  Allergen Reactions   Imitrex [Sumatriptan] Other (See Comments)    Vomiting, Slurred Speech and Facial Numbness   Buspirone Other (See Comments)   Gadolinium Derivatives Other (See Comments)    Lip tingling, scratchy throat   Iodinated Contrast Media Itching and Cough    Patient experienced sneezing, throat itching, and "feeling like needing to cough" Given 25 mg benadryl and observed x 20 mins/MMS Per dr Polly Cobia patient should have 13 hour prep   Other Itching    States reaction to contrast dye used for CTA of her brain - itching of tongue, tingling lower lip, coughing    Current Outpatient Medications on File Prior to Visit  Medication Sig Dispense Refill   acetaminophen (TYLENOL) 500 MG tablet Take 1,000 mg by mouth every 6 (six) hours as needed (headaches/migraines/pain).     aspirin 81 MG EC tablet Take by mouth.     Cholecalciferol (VITAMIN D3) 50 MCG (2000 UT) TABS Take by mouth.     clonazePAM (KLONOPIN) 0.5 MG tablet Take 1 tablet (0.5 mg total) by mouth daily as needed for anxiety. For severe panic attacks 15 tablet  1   escitalopram (LEXAPRO) 20 MG tablet Take 1 tablet (20 mg total) by mouth daily. 90 tablet 1   valACYclovir (VALTREX) 1000 MG tablet Take 2 g by mouth 2 (two) times daily as needed (fever blisters).     No current facility-administered medications on file prior to visit.    BP 102/64   Pulse 90   Temp 98.8 F (37.1 C) (Temporal)   Ht 5' 10"$  (1.778 m)   Wt 240 lb (108.9 kg)   LMP 07/12/2022 (Exact Date)   SpO2 98%   BMI 34.44 kg/m  Objective:   Physical Exam Cardiovascular:     Rate and Rhythm: Normal rate and regular rhythm.  Pulmonary:     Effort: Pulmonary effort is normal.     Breath sounds: Normal breath sounds.  Musculoskeletal:     Cervical back: Neck supple.  Skin:    General: Skin is warm and dry.     Comments: Flushing to bilateral cheeks. Slightly rough texture noted to right cheek with one raised, flesh colored bump.           Assessment & Plan:  Rash and nonspecific skin eruption Assessment & Plan: Etiology unclear. Doesn't appear to be Rosacea. It is not herpes zoster.  Trial of triamcinolone 0.1% cream provided to use daily PRN. She will set up a follow up visit with dermatology.   Orders: -     Triamcinolone Acetonide; Apply 1 Application topically 2 (two) times daily as needed.  Dispense: 30 g; Refill: 0  Class 1 obesity due to excess calories with serious comorbidity and body mass index (BMI) of 34.0 to 34.9 in adult Assessment & Plan: Discussion today regarding options.  Recommended Noom or weight watches. Lexapro could be contributing but she's done so well. Not a candidate for phentermine given prior stroke.   Consider GLP 1 agonists. She will continue to work on lifestyle changes and update.    Decreased libido Assessment & Plan: Could be SSRI side effect, although she is doing quite well on this regimen.  Will consult with Urology for options. Consider Androgel.          Pleas Koch, NP

## 2022-07-22 ENCOUNTER — Other Ambulatory Visit: Payer: Self-pay | Admitting: Primary Care

## 2022-07-22 DIAGNOSIS — R6882 Decreased libido: Secondary | ICD-10-CM

## 2022-07-30 ENCOUNTER — Other Ambulatory Visit
Admission: RE | Admit: 2022-07-30 | Discharge: 2022-07-30 | Disposition: A | Discharge status: Home / Self Care | Payer: Commercial Managed Care - PPO | Attending: Primary Care | Admitting: Primary Care

## 2022-07-30 DIAGNOSIS — R6882 Decreased libido: Secondary | ICD-10-CM

## 2022-07-30 LAB — CBC
HCT: 41.3 % (ref 36.0–46.0)
Hemoglobin: 13.7 g/dL (ref 12.0–15.0)
MCH: 29.7 pg (ref 26.0–34.0)
MCHC: 33.2 g/dL (ref 30.0–36.0)
MCV: 89.4 fL (ref 80.0–100.0)
Platelets: 311 10*3/uL (ref 150–400)
RBC: 4.62 MIL/uL (ref 3.87–5.11)
RDW: 12.5 % (ref 11.5–15.5)
WBC: 5.9 10*3/uL (ref 4.0–10.5)
nRBC: 0 % (ref 0.0–0.2)

## 2022-07-31 ENCOUNTER — Other Ambulatory Visit: Payer: Self-pay

## 2022-07-31 ENCOUNTER — Other Ambulatory Visit: Payer: Self-pay | Admitting: Primary Care

## 2022-07-31 ENCOUNTER — Encounter: Payer: Self-pay | Admitting: Pharmacist

## 2022-07-31 DIAGNOSIS — R6882 Decreased libido: Secondary | ICD-10-CM

## 2022-07-31 LAB — TESTOSTERONE: Testosterone: 66 ng/dL — ABNORMAL HIGH (ref 8–60)

## 2022-07-31 MED ORDER — TESTOSTERONE 25 MG/2.5GM (1%) TD GEL
TRANSDERMAL | 0 refills | Status: DC
  Filled 2022-07-31: qty 2.5, fill #0
  Filled 2022-07-31 – 2022-08-10 (×2): qty 75, 30d supply, fill #0

## 2022-08-01 ENCOUNTER — Other Ambulatory Visit: Payer: Self-pay

## 2022-08-09 NOTE — Telephone Encounter (Signed)
Received notification from [facility]    Patient has been notified of the denial and advised she would check with pharmacy to see how much the out of pocket cost would be.

## 2022-08-09 NOTE — Telephone Encounter (Signed)
Noted  

## 2022-08-10 ENCOUNTER — Other Ambulatory Visit: Payer: Self-pay

## 2022-08-13 NOTE — Telephone Encounter (Signed)
Spoke to patient she states "its better than it was, still have some swelling and redness and in the middle its a blister looking thing, tell Anda Kraft the blister looks like what i had from a bite on my wrist when we were back in [place] location if she remembers. very weird, i dont know if it was a spider than also? but this is only the second time i had a reaction like that to a bite. its like full of fluid some in the middle" She has been using the triamcinolone cream and neosporin and reports it is getting better, the other blister looking thing she had took weeks to resolve.

## 2022-08-31 ENCOUNTER — Ambulatory Visit: Payer: Commercial Managed Care - PPO | Admitting: Primary Care

## 2022-10-22 ENCOUNTER — Other Ambulatory Visit: Payer: Self-pay

## 2022-10-22 ENCOUNTER — Telehealth: Payer: Self-pay | Admitting: Psychiatry

## 2022-10-22 DIAGNOSIS — F3342 Major depressive disorder, recurrent, in full remission: Secondary | ICD-10-CM

## 2022-10-22 MED ORDER — ESCITALOPRAM OXALATE 20 MG PO TABS
20.0000 mg | ORAL_TABLET | Freq: Every day | ORAL | 1 refills | Status: DC
  Filled 2022-10-22: qty 90, 90d supply, fill #0
  Filled 2023-01-15: qty 90, 90d supply, fill #1

## 2022-10-22 NOTE — Telephone Encounter (Signed)
I have sent Lexapro to pharmacy for this patient-[facility].

## 2022-10-22 NOTE — Telephone Encounter (Signed)
Patient is scheduled for a virtual appointment for July and is on waitlist. Requested a refill on Lexapro-Please advise

## 2022-10-22 NOTE — Telephone Encounter (Signed)
Pt.notified

## 2022-12-04 ENCOUNTER — Telehealth: Payer: Self-pay | Admitting: Psychiatry

## 2022-12-04 NOTE — Telephone Encounter (Signed)
Provider is out of the office. 6/20 & 6/25 message left on answering machine to call and reschedule. A mychart message sent also

## 2022-12-10 ENCOUNTER — Telehealth: Payer: Commercial Managed Care - PPO | Admitting: Psychiatry

## 2022-12-28 ENCOUNTER — Other Ambulatory Visit: Payer: Self-pay

## 2022-12-28 ENCOUNTER — Other Ambulatory Visit: Payer: Self-pay | Admitting: Primary Care

## 2022-12-28 DIAGNOSIS — B009 Herpesviral infection, unspecified: Secondary | ICD-10-CM

## 2022-12-28 MED ORDER — VALACYCLOVIR HCL 1 G PO TABS
2000.0000 mg | ORAL_TABLET | Freq: Two times a day (BID) | ORAL | 0 refills | Status: DC
  Filled 2022-12-28: qty 12, 3d supply, fill #0

## 2022-12-28 NOTE — Telephone Encounter (Signed)
Patient is due for CPE/follow up in September, this will be required prior to any further refills.  Please schedule, thank you!

## 2022-12-28 NOTE — Telephone Encounter (Signed)
Patient has been scheduled

## 2023-01-04 ENCOUNTER — Other Ambulatory Visit: Payer: Self-pay

## 2023-01-09 ENCOUNTER — Encounter (INDEPENDENT_AMBULATORY_CARE_PROVIDER_SITE_OTHER): Payer: Self-pay

## 2023-01-21 DIAGNOSIS — L718 Other rosacea: Secondary | ICD-10-CM | POA: Diagnosis not present

## 2023-01-21 DIAGNOSIS — L858 Other specified epidermal thickening: Secondary | ICD-10-CM | POA: Diagnosis not present

## 2023-01-21 DIAGNOSIS — L538 Other specified erythematous conditions: Secondary | ICD-10-CM | POA: Diagnosis not present

## 2023-01-21 DIAGNOSIS — R208 Other disturbances of skin sensation: Secondary | ICD-10-CM | POA: Diagnosis not present

## 2023-01-21 DIAGNOSIS — L918 Other hypertrophic disorders of the skin: Secondary | ICD-10-CM | POA: Diagnosis not present

## 2023-01-24 ENCOUNTER — Encounter (INDEPENDENT_AMBULATORY_CARE_PROVIDER_SITE_OTHER): Payer: Self-pay

## 2023-02-01 ENCOUNTER — Encounter: Payer: Commercial Managed Care - PPO | Admitting: Primary Care

## 2023-02-14 ENCOUNTER — Ambulatory Visit (INDEPENDENT_AMBULATORY_CARE_PROVIDER_SITE_OTHER): Payer: Commercial Managed Care - PPO | Admitting: Primary Care

## 2023-02-14 ENCOUNTER — Encounter: Payer: Self-pay | Admitting: Primary Care

## 2023-02-14 VITALS — BP 108/64 | HR 80 | Temp 97.4°F | Ht 69.5 in | Wt 236.0 lb

## 2023-02-14 DIAGNOSIS — F330 Major depressive disorder, recurrent, mild: Secondary | ICD-10-CM | POA: Diagnosis not present

## 2023-02-14 DIAGNOSIS — Z803 Family history of malignant neoplasm of breast: Secondary | ICD-10-CM | POA: Diagnosis not present

## 2023-02-14 DIAGNOSIS — Z0000 Encounter for general adult medical examination without abnormal findings: Secondary | ICD-10-CM | POA: Diagnosis not present

## 2023-02-14 DIAGNOSIS — R7303 Prediabetes: Secondary | ICD-10-CM | POA: Diagnosis not present

## 2023-02-14 DIAGNOSIS — Z8673 Personal history of transient ischemic attack (TIA), and cerebral infarction without residual deficits: Secondary | ICD-10-CM | POA: Diagnosis not present

## 2023-02-14 DIAGNOSIS — Z1231 Encounter for screening mammogram for malignant neoplasm of breast: Secondary | ICD-10-CM

## 2023-02-14 DIAGNOSIS — B009 Herpesviral infection, unspecified: Secondary | ICD-10-CM

## 2023-02-14 DIAGNOSIS — E559 Vitamin D deficiency, unspecified: Secondary | ICD-10-CM

## 2023-02-14 DIAGNOSIS — R7989 Other specified abnormal findings of blood chemistry: Secondary | ICD-10-CM

## 2023-02-14 DIAGNOSIS — R6882 Decreased libido: Secondary | ICD-10-CM | POA: Diagnosis not present

## 2023-02-14 DIAGNOSIS — F419 Anxiety disorder, unspecified: Secondary | ICD-10-CM | POA: Diagnosis not present

## 2023-02-14 DIAGNOSIS — F32A Depression, unspecified: Secondary | ICD-10-CM

## 2023-02-14 DIAGNOSIS — L719 Rosacea, unspecified: Secondary | ICD-10-CM | POA: Diagnosis not present

## 2023-02-14 LAB — LIPID PANEL
Cholesterol: 154 mg/dL (ref 0–200)
HDL: 51.1 mg/dL (ref >39.00)
LDL Cholesterol: 87 mg/dL (ref 0–99)
NonHDL: 102.63
Total CHOL/HDL Ratio: 3
Triglycerides: 79 mg/dL (ref 0.0–149.0)
VLDL: 15.8 mg/dL (ref 0.0–40.0)

## 2023-02-14 LAB — COMPREHENSIVE METABOLIC PANEL
ALT: 30 U/L (ref 0–35)
AST: 20 U/L (ref 0–37)
Albumin: 4.2 g/dL (ref 3.5–5.2)
Alkaline Phosphatase: 56 U/L (ref 39–117)
BUN: 14 mg/dL (ref 6–23)
CO2: 28 meq/L (ref 19–32)
Calcium: 9.2 mg/dL (ref 8.4–10.5)
Chloride: 103 meq/L (ref 96–112)
Creatinine, Ser: 0.83 mg/dL (ref 0.40–1.20)
GFR: 90.37 mL/min (ref >60.00)
Glucose, Bld: 91 mg/dL (ref 70–99)
Potassium: 4.2 meq/L (ref 3.5–5.1)
Sodium: 137 meq/L (ref 135–145)
Total Bilirubin: 0.5 mg/dL (ref 0.2–1.2)
Total Protein: 7.4 g/dL (ref 6.0–8.3)

## 2023-02-14 LAB — POCT GLYCOSYLATED HEMOGLOBIN (HGB A1C): Hemoglobin A1C: 5.9 % — AB (ref 4.0–5.6)

## 2023-02-14 LAB — VITAMIN D 25 HYDROXY (VIT D DEFICIENCY, FRACTURES): VITD: 37.82 ng/mL (ref 30.00–100.00)

## 2023-02-14 NOTE — Assessment & Plan Note (Signed)
Improved with A1C today of 5.9.  Continue to work on exercise and diet. Continue to monitor.

## 2023-02-14 NOTE — Assessment & Plan Note (Signed)
Infrequent outbreaks.  Continue Valtrex 2000 mg BID x 1 day PRN. 

## 2023-02-14 NOTE — Assessment & Plan Note (Signed)
Immunizations UTD. She will get influenza vaccine at work. Pap smear UTD. Mammogram due, orders placed.   Discussed the importance of a healthy diet and regular exercise in order for weight loss, and to reduce the risk of further co-morbidity.  Exam stable. Labs pending.  Follow up in 1 year for repeat physical.

## 2023-02-14 NOTE — Assessment & Plan Note (Signed)
Testosterone gel cost prohibitive.  She will defer treatment for now.

## 2023-02-14 NOTE — Assessment & Plan Note (Signed)
Following with psychiatry. Continue Lexapro 20 mg daily.

## 2023-02-14 NOTE — Assessment & Plan Note (Signed)
Not on supplement. She will resume.

## 2023-02-14 NOTE — Patient Instructions (Signed)
Stop by the lab prior to leaving today. I will notify you of your results once received.   [facility].   It was a pleasure to see you today!   

## 2023-02-14 NOTE — Assessment & Plan Note (Signed)
Following with dermatology. Continue Ivermectin 1% cream PRN.

## 2023-02-14 NOTE — Progress Notes (Signed)
Subjective:    Patient ID: Autumn [name], female    DOB: 04/21/1986, 37 y.o.   MRN: [id number]  HPI  Autumn [name] is a very pleasant 37 y.o. female who presents today for complete physical and follow up of chronic conditions.  Immunizations: -Tetanus: Completed in 2020 -Influenza: Will complete at work  Diet: Fair diet.  Exercise: No regular exercise.  Eye exam: Completed in 2022, will schedule.  Dental exam: Completes semi-annually    Pap Smear: Completed in 2023 Mammogram: Due  BP Readings from Last 3 Encounters:  02/14/23 108/64  07/20/22 102/64  04/25/22 100/70       Review of Systems  Constitutional:  Negative for unexpected weight change.  HENT:  Negative for rhinorrhea.   Respiratory:  Negative for cough and shortness of breath.   Cardiovascular:  Negative for chest pain.  Gastrointestinal:  Negative for constipation and diarrhea.  Genitourinary:  Negative for difficulty urinating and menstrual problem.  Musculoskeletal:  Negative for arthralgias and myalgias.  Skin:  Negative for rash.  Allergic/Immunologic: Negative for environmental allergies.  Neurological:  Negative for dizziness and headaches.  Psychiatric/Behavioral:  The patient is nervous/anxious.          Past Medical History:  Diagnosis Date   Anxiety    Back pain    chronic   Bilateral pulmonary embolism ([facility])    a. 06/2020 following COVID infection.   COVID-19 virus infection 06/2020   Depression    Disc disease, degenerative, lumbar or lumbosacral    Herpes zoster without complication 06/20/2018   History of echocardiogram    a. 06/2020 Echo: EF 55-60%, no rwma, nl RV size/fxn.   History of ovarian cyst    Imbalance 10/25/2020   Lacunar stroke ([facility]) 12/08/2020   Left upper quadrant abdominal pain 10/23/2019   Lightheadedness 05/25/2021   Lung nodule 06/22/2020   Migraine    Nonallopathic lesion of cervical region 04/16/2018   Nonallopathic lesion of rib cage  04/16/2018   Nonallopathic lesion of thoracic region 04/16/2018   OME (otitis media with effusion), right 12/06/2020   Palpitations    a. 2/022 Zio: RSR, 85 (47-112). Rare PACs/PVCs. No significant arrhythmias/prolonged pauses.   Paresthesia 06/30/2019   Prediabetes    Scoliosis    Shingles 06/20/2018   Stroke ([facility]) 2022   Remote lacunar strokes noted in the cerebellum by MRI    Social History   Socioeconomic History   Marital status: Single    Spouse name: Not on file   Number of children: 1   Years of education: Not on file   Highest education level: Not on file  Occupational History   Occupation: care manager  Tobacco Use   Smoking status: Former    Current packs/day: 0.00    Types: Cigarettes    Start date: 08/19/2006    Quit date: 08/18/2016    Years since quitting: 6.4   Smokeless tobacco: Never   Tobacco comments:    3 cigarettes a day  Vaping Use   Vaping status: Never Used  Substance and Sexual Activity   Alcohol use: Yes    Alcohol/week: 0.0 standard drinks of alcohol    Comment: occasional   Drug use: No   Sexual activity: Yes    Birth control/protection: Pill  Other Topics Concern   Not on file  Social History Narrative   Right handed   Social Determinants of Health   Financial Resource Strain: Not on file  Food Insecurity: Not on file  Transportation Needs: Not on file  Physical Activity: Not on file  Stress: Not on file  Social Connections: Not on file  Intimate Partner Violence: Not on file    Past Surgical History:  Procedure Laterality Date   APPENDECTOMY     BREAST SURGERY Right 2008   lump/ benign   TEE WITHOUT CARDIOVERSION N/A 12/13/2020   Procedure: TRANSESOPHAGEAL ECHOCARDIOGRAM (TEE);  Surgeon: Yvonne Kendall, MD;  Location: [facility] ORS;  Service: Cardiovascular;  Laterality: N/A;    Family History  Problem Relation Age of Onset   Breast cancer Mother 19       and 34   Atrial fibrillation Mother    Alcohol abuse Father     Vision loss Maternal Grandmother    Heart failure Maternal Grandmother    Cancer Maternal Grandfather    Ankylosing spondylitis Maternal Grandfather    Alcohol abuse Paternal Grandfather    Arthritis/Rheumatoid Maternal Aunt    Anxiety disorder Maternal Aunt    Depression Maternal Aunt    Ankylosing spondylitis Maternal Aunt    Atrial fibrillation Maternal Aunt     Allergies  Allergen Reactions   Imitrex [Sumatriptan] Other (See Comments)    Vomiting, Slurred Speech and Facial Numbness   Buspirone Other (See Comments)   Gadolinium Derivatives Other (See Comments)    Lip tingling, scratchy throat   Iodinated Contrast Media Itching and Cough    Patient experienced sneezing, throat itching, and "feeling like needing to cough" Given 25 mg benadryl and observed x 20 mins/MMS Per dr Ashley Murrain patient should have 13 hour prep   Other Itching    States reaction to contrast dye used for CTA of her brain - itching of tongue, tingling lower lip, coughing    Current Outpatient Medications on File Prior to Visit  Medication Sig Dispense Refill   acetaminophen (TYLENOL) 500 MG tablet Take 1,000 mg by mouth every 6 (six) hours as needed (headaches/migraines/pain).     aspirin 81 MG EC tablet Take by mouth.     Cholecalciferol (VITAMIN D3) 50 MCG (2000 UT) TABS Take by mouth.     clonazePAM (KLONOPIN) 0.5 MG tablet Take 1 tablet (0.5 mg total) by mouth daily as needed for anxiety. For severe panic attacks 15 tablet 1   escitalopram (LEXAPRO) 20 MG tablet Take 1 tablet (20 mg total) by mouth daily. 90 tablet 1   Ivermectin 1 % CREA Apply topically.     valACYclovir (VALTREX) 1000 MG tablet Take 2 tablets (2,000 mg total) by mouth 2 (two) times daily for one day as needed for outbreaks. 12 tablet 0   triamcinolone cream (KENALOG) 0.1 % Apply 1 Application topically 2 (two) times daily as needed. (Patient not taking: Reported on 02/14/2023) 30 g 0   No current facility-administered medications on file  prior to visit.    BP 108/64   Pulse 80   Temp (!) 97.4 F (36.3 C) (Temporal)   Ht 5' 9.5" (1.765 m)   Wt 236 lb (107 kg)   LMP 02/05/2023   SpO2 96%   BMI 34.35 kg/m  Objective:   Physical Exam HENT:     Right Ear: Tympanic membrane and ear canal normal.     Left Ear: Tympanic membrane and ear canal normal.     Nose: Nose normal.  Eyes:     Conjunctiva/sclera: Conjunctivae normal.     Pupils: Pupils are equal, round, and reactive to light.  Neck:     Thyroid: No thyromegaly.  Cardiovascular:  Rate and Rhythm: Normal rate and regular rhythm.     Heart sounds: No murmur heard. Pulmonary:     Effort: Pulmonary effort is normal.     Breath sounds: Normal breath sounds. No rales.  Abdominal:     General: Bowel sounds are normal.     Palpations: Abdomen is soft.     Tenderness: There is no abdominal tenderness.  Musculoskeletal:        General: Normal range of motion.     Cervical back: Neck supple.  Lymphadenopathy:     Cervical: No cervical adenopathy.  Skin:    General: Skin is warm and dry.     Findings: No rash.  Neurological:     Mental Status: She is alert and oriented to person, place, and time.     Cranial Nerves: No cranial nerve deficit.     Deep Tendon Reflexes: Reflexes are normal and symmetric.  Psychiatric:        Mood and Affect: Mood normal.           Assessment & Plan:  Preventative health care Assessment & Plan: Immunizations UTD. She will get influenza vaccine at work. Pap smear UTD. Mammogram due, orders placed.   Discussed the importance of a healthy diet and regular exercise in order for weight loss, and to reduce the risk of further co-morbidity.  Exam stable. Labs pending.  Follow up in 1 year for repeat physical.    Rosacea Assessment & Plan: Following with dermatology. Continue Ivermectin 1% cream PRN.   Anxiety and depression Assessment & Plan: Overall controlled.  Following with psychiatry. Continue Lexapro  20 mg daily. Continue Clonazepam 0.5 mg daily.      Family history of breast cancer in mother Assessment & Plan: Due for mammogram. Orders placed.   Orders: -     3D Screening Mammogram, Left and Right; Future  History of cryptogenic stroke Assessment & Plan: Released by neurology. Continue aspirin 81 mg daily.  No new symptoms. Stable.   Herpes simplex virus (HSV) infection Assessment & Plan: Infrequent outbreaks. Continue Valtrex 2000 mg BID x 1 day PRN.   Decreased libido Assessment & Plan: Testosterone gel cost prohibitive.  She will defer treatment for now.   Low vitamin B12 level Assessment & Plan: Not on supplement. She will resume.    MDD (major depressive disorder), recurrent episode, mild ([facility]) Assessment & Plan: Following with psychiatry. Continue Lexapro 20 mg daily.    Screening mammogram for breast cancer -     3D Screening Mammogram, Left and Right; Future  Prediabetes Assessment & Plan: Improved with A1C today of 5.9.  Continue to work on exercise and diet. Continue to monitor.  Orders: -     POCT glycosylated hemoglobin (Hb A1C) -     Lipid panel -     Comprehensive metabolic panel  Vitamin D deficiency Assessment & Plan: Continue vitamin D3 2000 international unit  daily  Repeat vitamin D level pending  Orders: -     VITAMIN D 25 Hydroxy (Vit-D Deficiency, Fractures)        Doreene Nest, NP

## 2023-02-14 NOTE — Assessment & Plan Note (Signed)
Overall controlled.  Following with psychiatry. Continue Lexapro 20 mg daily. Continue Clonazepam 0.5 mg daily.

## 2023-02-14 NOTE — Assessment & Plan Note (Signed)
Released by neurology. Continue aspirin 81 mg daily.  No new symptoms. Stable.

## 2023-02-14 NOTE — Assessment & Plan Note (Signed)
Due for mammogram. Orders placed.

## 2023-02-14 NOTE — Assessment & Plan Note (Signed)
Continue vitamin D3 2000 international unit  daily  Repeat vitamin D level pending

## 2023-02-25 ENCOUNTER — Telehealth (INDEPENDENT_AMBULATORY_CARE_PROVIDER_SITE_OTHER): Payer: Commercial Managed Care - PPO | Admitting: Psychiatry

## 2023-02-25 ENCOUNTER — Encounter: Payer: Self-pay | Admitting: Psychiatry

## 2023-02-25 DIAGNOSIS — F3342 Major depressive disorder, recurrent, in full remission: Secondary | ICD-10-CM | POA: Diagnosis not present

## 2023-02-25 DIAGNOSIS — F418 Other specified anxiety disorders: Secondary | ICD-10-CM | POA: Diagnosis not present

## 2023-02-25 DIAGNOSIS — Z79899 Other long term (current) drug therapy: Secondary | ICD-10-CM

## 2023-02-25 NOTE — Progress Notes (Unsigned)
Virtual Visit via Video Note  I connected with Autumn [name] on 02/25/23 at  4:00 PM EDT by a video enabled telemedicine application and verified that I am speaking with the correct person using two identifiers.  Location Provider Location : [facility] Patient Location : Work  Participants: Patient , Provider   I discussed the limitations of evaluation and management by telemedicine and the availability of in person appointments. The patient expressed understanding and agreed to proceed.   I discussed the assessment and treatment plan with the patient. The patient was provided an opportunity to ask questions and all were answered. The patient agreed with the plan and demonstrated an understanding of the instructions.   The patient was advised to call back or seek an in-person evaluation if the symptoms worsen or if the condition fails to improve as anticipated.    BH MD OP Progress Note  02/27/2023 2:17 PM Autumn [name]  MRN:  [id number]  Chief Complaint:  Chief Complaint  Patient presents with   Follow-up   Depression   Anxiety   Medication Refill   HPI: Autumn [name] is a 37 year old Caucasian female, employed, separated, lives in [place], has a history of MDD, other specified anxiety disorder, prediabetes, history of tachycardia, history of bilateral pulmonary embolism on anticoagulation was evaluated by telemedicine today.  Patient today reports she is currently at her new home with her girlfriend.  She reports her girlfriend owns the house and she has been supporting by paying the bills.  She reports recently she had relationship issues with her girlfriend however currently that is resolved.  She needed clonazepam when she was going through the relationship problems.  She may have used 5 pills that month.  She continues to follow-up with her therapist Ms. Sander Radon.  Patient reports mood symptoms are stable.  Denies any significant depression or anxiety  symptoms.  Patient reports sleep is overall good.  Patient is currently compliant on the Lexapro.  Stopped taking the BuSpar several months ago since she felt it was giving her side effects of being tired and fatigued.  Patient denies any suicidality, homicidality or perceptual disturbances.  Patient reports her son started high school.  That has been an adjustment and she is getting used to it.  She reports work is going well.  She currently works at a urology office.  Patient denies any other concerns today.    Visit Diagnosis:    ICD-10-CM   1. MDD (major depressive disorder), recurrent, in full remission (HCC)  F33.42     2. Other specified anxiety disorders  F41.8 Urine drugs of abuse scrn w alc, routine (Ref Lab)   with limited symptom attacks    3. High risk medication use  Z79.899 Urine drugs of abuse scrn w alc, routine (Ref Lab)      Past Psychiatric History: I have reviewed past psychiatric history from progress note on 02/10/2020.  Past trials of Lexapro, Wellbutrin-noncompliant.  Reviewed past psychiatric history from progress note on 02/10/2020.  Past Medical History:  Past Medical History:  Diagnosis Date   Anxiety    Back pain    chronic   Bilateral pulmonary embolism (HCC)    a. 06/2020 following COVID infection.   COVID-19 virus infection 06/2020   Depression    Disc disease, degenerative, lumbar or lumbosacral    Herpes zoster without complication 06/20/2018   History of echocardiogram    a. 06/2020 Echo: EF 55-60%, no rwma, nl RV size/fxn.  History of ovarian cyst    Imbalance 10/25/2020   Lacunar stroke (HCC) 12/08/2020   Left upper quadrant abdominal pain 10/23/2019   Lightheadedness 05/25/2021   Lung nodule 06/22/2020   Migraine    Nonallopathic lesion of cervical region 04/16/2018   Nonallopathic lesion of rib cage 04/16/2018   Nonallopathic lesion of thoracic region 04/16/2018   OME (otitis media with effusion), right 12/06/2020   Palpitations     a. 2/022 Zio: RSR, 85 (47-112). Rare PACs/PVCs. No significant arrhythmias/prolonged pauses.   Paresthesia 06/30/2019   Prediabetes    Scoliosis    Shingles 06/20/2018   Stroke (HCC) 2022   Remote lacunar strokes noted in the cerebellum by MRI    Past Surgical History:  Procedure Laterality Date   APPENDECTOMY     BREAST SURGERY Right 2008   lump/ benign   TEE WITHOUT CARDIOVERSION N/A 12/13/2020   Procedure: TRANSESOPHAGEAL ECHOCARDIOGRAM (TEE);  Surgeon: Yvonne Kendall, MD;  Location: [facility] ORS;  Service: Cardiovascular;  Laterality: N/A;    Family Psychiatric History: Reviewed family psychiatric history from progress note on 02/10/2020.  Family History:  Family History  Problem Relation Age of Onset   Breast cancer Mother 63       and 72   Atrial fibrillation Mother    Alcohol abuse Father    Vision loss Maternal Grandmother    Heart failure Maternal Grandmother    Cancer Maternal Grandfather    Ankylosing spondylitis Maternal Grandfather    Alcohol abuse Paternal Grandfather    Arthritis/Rheumatoid Maternal Aunt    Anxiety disorder Maternal Aunt    Depression Maternal Aunt    Ankylosing spondylitis Maternal Aunt    Atrial fibrillation Maternal Aunt     Social History: Reviewed social history from progress note on 02/10/2020. Social History   Socioeconomic History   Marital status: Single    Spouse name: Not on file   Number of children: 1   Years of education: Not on file   Highest education level: Not on file  Occupational History   Occupation: care manager  Tobacco Use   Smoking status: Former    Current packs/day: 0.00    Types: Cigarettes    Start date: 08/19/2006    Quit date: 08/18/2016    Years since quitting: 6.5   Smokeless tobacco: Never   Tobacco comments:    3 cigarettes a day  Vaping Use   Vaping status: Never Used  Substance and Sexual Activity   Alcohol use: Yes    Alcohol/week: 0.0 standard drinks of alcohol    Comment: occasional    Drug use: No   Sexual activity: Yes    Birth control/protection: Pill  Other Topics Concern   Not on file  Social History Narrative   Right handed   Social Determinants of Health   Financial Resource Strain: Not on file  Food Insecurity: Not on file  Transportation Needs: Not on file  Physical Activity: Not on file  Stress: Not on file  Social Connections: Not on file    Allergies:  Allergies  Allergen Reactions   Imitrex [Sumatriptan] Other (See Comments)    Vomiting, Slurred Speech and Facial Numbness   Buspirone Other (See Comments)   Gadolinium Derivatives Other (See Comments)    Lip tingling, scratchy throat   Iodinated Contrast Media Itching and Cough    Patient experienced sneezing, throat itching, and "feeling like needing to cough" Given 25 mg benadryl and observed x 20 mins/MMS Per dr Ashley Murrain patient should  have 13 hour prep   Other Itching    States reaction to contrast dye used for CTA of her brain - itching of tongue, tingling lower lip, coughing    Metabolic Disorder Labs: Lab Results  Component Value Date   HGBA1C 5.9 (A) 02/14/2023   No results found for: "PROLACTIN" Lab Results  Component Value Date   CHOL 154 02/14/2023   TRIG 79.0 02/14/2023   HDL 51.10 02/14/2023   CHOLHDL 3 02/14/2023   VLDL 15.8 02/14/2023   LDLCALC 87 02/14/2023   LDLCALC 77 01/19/2022   Lab Results  Component Value Date   TSH 2.03 12/30/2020   TSH 1.029 07/08/2020    Therapeutic Level Labs: No results found for: "LITHIUM" No results found for: "VALPROATE" No results found for: "CBMZ"  Current Medications: Current Outpatient Medications  Medication Sig Dispense Refill   acetaminophen (TYLENOL) 500 MG tablet Take 1,000 mg by mouth every 6 (six) hours as needed (headaches/migraines/pain).     aspirin 81 MG EC tablet Take by mouth.     Cholecalciferol (VITAMIN D3) 50 MCG (2000 UT) TABS Take by mouth.     clonazePAM (KLONOPIN) 0.5 MG tablet Take 1 tablet (0.5 mg  total) by mouth daily as needed for anxiety. For severe panic attacks 15 tablet 1   escitalopram (LEXAPRO) 20 MG tablet Take 1 tablet (20 mg total) by mouth daily. 90 tablet 1   Ivermectin 1 % CREA Apply topically.     triamcinolone cream (KENALOG) 0.1 % Apply 1 Application topically 2 (two) times daily as needed. (Patient not taking: Reported on 02/14/2023) 30 g 0   valACYclovir (VALTREX) 1000 MG tablet Take 2 tablets (2,000 mg total) by mouth 2 (two) times daily for one day as needed for outbreaks. 12 tablet 0   No current facility-administered medications for this visit.     Musculoskeletal: Strength & Muscle Tone:  UTA Gait & Station:  Seated Patient leans: N/A  Psychiatric Specialty Exam: Review of Systems  Psychiatric/Behavioral: Negative.      Last menstrual period 02/05/2023.There is no height or weight on file to calculate BMI.  General Appearance: Fairly Groomed  Eye Contact:  Fair  Speech:  Clear and Coherent  Volume:  Normal  Mood:  Euthymic  Affect:  Appropriate  Thought Process:  Goal Directed and Descriptions of Associations: Intact  Orientation:  Full (Time, Place, and Person)  Thought Content: Logical   Suicidal Thoughts:  No  Homicidal Thoughts:  No  Memory:  Immediate;   Fair Recent;   Fair Remote;   Fair  Judgement:  Fair  Insight:  Fair  Psychomotor Activity:  Normal  Concentration:  Concentration: Fair and Attention Span: Fair  Recall:  [employer] of Knowledge: Fair  Language: Fair  Akathisia:  No  Handed:  Right  AIMS (if indicated): not done  Assets:  Communication Skills Desire for Improvement Housing Social Support Transportation  ADL's:  Intact  Cognition: WNL  Sleep:  Fair   Screenings: AIMS    Flowsheet Row Video Visit from 11/20/2021 in [facility]  AIMS Total Score 0      GAD-7    Flowsheet Row Office Visit from 02/14/2023 in [facility] Visit  from 07/20/2022 in [facility] Video Visit from 11/20/2021 in [facility] Video Visit from 06/20/2021 in [facility] Office Visit from 05/16/2021 in [facility]  Total GAD-7 Score 2 3 2 2 7       PHQ2-9    Flowsheet Row Video Visit from 02/25/2023 in [facility] Office Visit from 02/14/2023 in [facility] Office Visit from 07/20/2022 in [facility] Office Visit from 01/18/2022 in [facility] Video Visit from 01/16/2022 in [facility]  PHQ-2 Total Score 0 2 2 0 0  PHQ-9 Total Score -- 5 5 7  --      Flowsheet Row Video Visit from 02/25/2023 in [facility] Video Visit from 01/16/2022 in [facility] Office Visit from 05/16/2021 in [facility]  C-SSRS RISK CATEGORY No Risk No Risk No Risk        Assessment and Plan: Autumn [name] is a 37 year old Caucasian female, employed, lives in [place], has a history of depression, anxiety was evaluated by telemedicine today.  Patient is currently stable.  Plan as noted below.  Plan MDD in remission Lexapro 20 mg p.o. daily Continue CBT as needed  Anxiety disorder-stable Lexapro 20 mg p.o. daily Discontinue BuSpar for noncompliance and side effects. Klonopin 0.5 mg as needed for severe anxiety attacks. Continue CBT Reviewed [place] PMP AWARxE  High risk medication use-will order urine drug screen.  Patient to go to [facility] lab.  Patient also will benefit from [facility] labs-however she agrees to get it done at her primary care provider's visit.  Follow-up in clinic in 4 to 5 months or sooner in  person.   Collaboration of Care: Collaboration of Care: Referral or follow-up with counselor/therapist AEB patient to continue psychotherapy sessions.  Patient/Guardian was advised Release of Information must be obtained prior to any record release in order to collaborate their care with an outside provider. Patient/Guardian was advised if they have not already done so to contact the registration department to sign all necessary forms in order for [place] to release information regarding their care.   Consent: Patient/Guardian gives verbal consent for treatment and assignment of benefits for services provided during this visit. Patient/Guardian expressed understanding and agreed to proceed.   This note was generated in part or whole with voice recognition software. Voice recognition is usually quite accurate but there are transcription errors that can and very often do occur. I apologize for any typographical errors that were not detected and corrected.    Jomarie Longs, MD 02/27/2023, 2:17 PM

## 2023-04-24 ENCOUNTER — Other Ambulatory Visit: Payer: Self-pay

## 2023-04-24 ENCOUNTER — Other Ambulatory Visit: Payer: Self-pay | Admitting: Psychiatry

## 2023-04-24 DIAGNOSIS — F3342 Major depressive disorder, recurrent, in full remission: Secondary | ICD-10-CM

## 2023-04-25 ENCOUNTER — Other Ambulatory Visit: Payer: Self-pay | Admitting: Psychiatry

## 2023-04-25 ENCOUNTER — Other Ambulatory Visit: Payer: Self-pay

## 2023-04-25 DIAGNOSIS — F3342 Major depressive disorder, recurrent, in full remission: Secondary | ICD-10-CM

## 2023-04-26 ENCOUNTER — Other Ambulatory Visit: Payer: Self-pay

## 2023-04-26 MED FILL — Escitalopram Oxalate Tab 20 MG (Base Equiv): ORAL | 90 days supply | Qty: 90 | Fill #0 | Status: AC

## 2023-05-01 ENCOUNTER — Other Ambulatory Visit: Payer: Self-pay

## 2023-06-24 NOTE — Progress Notes (Signed)
 NEUROLOGY FOLLOW UP OFFICE NOTE  Autumn [name] [id number]  Assessment/Plan:    Gait instability with history of bilateral cerebellar strokes Primary headache associated with sexual activity.  Isolated episode. Small cerebral aneurysm vs infundibulum not seen on most recent MRA    Check MRI and MRA of head to evaluate for new intracranial abnormalities and follow up for visualization of aneurysm. Continue ASA 81mg  daily Further recommendations pending results.  Total time spent in chart and face to face with patient:  32 minutes    Subjective:  Autumn [name] is a 38 year old right-handed female with migraines, DDD of lumbar spine, depression and anxiety and history of bilateral PE following COVID infection who follows up for ataxia   UPDATE: Current medications:  ASA 81mg   Since September, she will have brief spells lasting just a few seconds in which she will suddenly feel like she will tip backwards.  It might occur while standing but also while sitting.  It was occurring twice a month.  However, over the past 2 weeks, they have been almost daily.  She also has been experiencing increased fatigue and car sick feeling.  When she walks, she may feel herself either veering to the right or backwards.  She has not had any falls.  She feels a heaviness in her head and sensation that her eyes are moving, but denies spinning sensation.  She notes occasional twitching in her right eye.  Her girlfriend also noticed that when she is reading or watching TV, her lips seem to move in a weird manner but she cannot elaborate on this.  Denies any preceding illness or head trauma.  However, in August, she was having sex when she developed a sudden excruciating headache described as a sharp and squeezing sensation from the back of her neck radiating up and around her crown.  Lasted about 10-15 minutes.  No recurrence since then.      HISTORY: She had COVID in December 2021.  She had  multiple bilateral PEs in January and was started on Eliquis .  Hypercoagulable workup was negative.  She also had palpitations.  Cardiac event monitor in January-February 2022 was negative.  Around March 2022, she started having balance problems.  She may suddenly feel herself leaning towards the right.  While walking, she may start veering towards the right.  Sometimes if she bends over to pick something up, she may start leaning over to the right.  When she parks the car, she may park it close to or a little over the right line, but she doesn't veer while driving.  This doesn't happen all of the time.  No associated dizziness/vertigo.  If she stares or concentrates on something, such as looking at the computer, the image may slightly cross.  She denies difficulty with depth perception or reaching for objects with either hand.  Denies hearing loss, tinnitus, aural fullness.  On three separate occasions, she feels a sensation beginning from back of neck and spreading over her head to the front, lasting 2 to 3 seconds.  Afterwards, her vision was not focused, lasting several seconds.  On one occasion, she felt dizzy and nauseous for 45 minutes.  These events are different than her migraines, which are right temporal/retro-orbital pain with some nausea, occurs infrequently (only one since beginning in the year).  Sometimes she feels she has to focus more when she looks.  Eye exam was normal.  MRI of brain with and without contrast on 12/01/2020 showed  what was read as tiny remote lacunar infarcts in the bilateral cerebellar hemispheres.  CTA head and neck on 12/26/2020 showed no LVO or hemodynamically significant stenosis but did demonstrate a 1 mm infundibulum vs aneurysm arising from the supraclinoid left ICA.  TEE on 12/13/2020 was negative.    Since late 2022-early 2023, she endorsed intermittent episodes of paresthesias radiating up the left side of neck and face lasting a few seconds.  MRI of brain on 07/24/2021  personally reviewed demonstrated stable tiny bilateral cerebellar infarcts.  MRA of head showed no visible aneurysm.  The paresthesias stopped.    She reported subjective memory problems.  She may forget conversations.  Other times she may have word-finding difficulty or problems getting words out that she knows.  She does report stress related to being busy at work.  Her thyroid  levels are regularly checked.  B12 level just checked was in the 400s.  Vit D level was 19 and started on supplements.  She is not aware of any family history of neurologic conditions.    PAST MEDICAL HISTORY: Past Medical History:  Diagnosis Date   Anxiety    Back pain    chronic   Bilateral pulmonary embolism (HCC)    a. 06/2020 following COVID infection.   COVID-19 virus infection 06/2020   Depression    Disc disease, degenerative, lumbar or lumbosacral    Herpes zoster without complication 06/20/2018   History of echocardiogram    a. 06/2020 Echo: EF 55-60%, no rwma, nl RV size/fxn.   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 2/022 Zio: RSR, 85 (47-112). Rare PACs/PVCs. No significant arrhythmias/prolonged pauses.   Paresthesia 06/30/2019   Prediabetes    Scoliosis    Shingles 06/20/2018   Stroke (HCC) 2022   Remote lacunar strokes noted in the cerebellum by MRI    MEDICATIONS: Current Outpatient Medications on File Prior to Visit  Medication Sig Dispense Refill   acetaminophen  (TYLENOL ) 500 MG tablet Take 1,000 mg by mouth every 6 (six) hours as needed (headaches/migraines/pain).     aspirin  81 MG EC tablet Take by  mouth.     Cholecalciferol (VITAMIN D3) 50 MCG (2000 UT) TABS Take by mouth.     clonazePAM  (KLONOPIN ) 0.5 MG tablet Take 1 tablet (0.5 mg total) by mouth daily as needed for anxiety. For severe panic attacks 15 tablet 1   escitalopram  (LEXAPRO ) 20 MG tablet Take 1 tablet (20 mg total) by mouth daily. 90 tablet 1   Ivermectin 1 % CREA Apply topically.     triamcinolone  cream (KENALOG ) 0.1 % Apply 1 Application topically 2 (two) times daily as needed. (Patient not taking: Reported on 02/14/2023) 30 g 0   valACYclovir  (VALTREX ) 1000 MG tablet Take 2 tablets (2,000 mg total) by mouth 2 (two) times daily for one day as needed for outbreaks. 12 tablet 0   No current facility-administered medications on file prior to visit.    ALLERGIES: Allergies  Allergen Reactions   Imitrex [Sumatriptan] Other (See Comments)    Vomiting, Slurred Speech and Facial Numbness   Buspirone  Other (See Comments)  Gadolinium Derivatives Other (See Comments)    Lip tingling, scratchy throat   Iodinated Contrast Media Itching and Cough    Patient experienced sneezing, throat itching, and feeling like needing to cough Given 25 mg benadryl and observed x 20 mins/MMS Per dr Leonce patient should have 13 hour prep   Other Itching    States reaction to contrast dye used for CTA of her brain - itching of tongue, tingling lower lip, coughing    FAMILY HISTORY: Family History  Problem Relation Age of Onset   Breast cancer Mother 71       and 3   Atrial fibrillation Mother    Alcohol abuse Father    Vision loss Maternal Grandmother    Heart failure Maternal Grandmother    Cancer Maternal Grandfather    Ankylosing spondylitis Maternal Grandfather    Alcohol abuse Paternal Grandfather    Arthritis/Rheumatoid Maternal Aunt    Anxiety disorder Maternal Aunt    Depression Maternal Aunt    Ankylosing spondylitis Maternal Aunt    Atrial fibrillation Maternal Aunt       Objective:  Blood pressure 109/77, pulse 71,  height 5' 10 (1.778 m), weight 242 lb (109.8 kg), SpO2 95%. General: No acute distress.  Patient appears well-groomed.   Head:  Normocephalic/atraumatic Eyes:  Fundi examined but not visualized Neck: supple, no paraspinal tenderness, full range of motion Heart:  Regular rate and rhythm Neurological Exam: alert and oriented.  Speech fluent and not dysarthric, language intact.  CN II-XII intact. Bulk and tone normal, muscle strength 5/5 throughout.  Sensation to light touch intact.  Deep tendon reflexes 2+ throughout.  Finger to nose testing intact.  Gait normal.  She had some difficulty with tandem walk.  Romberg with mild sway.    Juliene Dunnings, DO  CC: Comer Gaskins, NP

## 2023-06-25 ENCOUNTER — Ambulatory Visit: Payer: Commercial Managed Care - PPO | Admitting: Neurology

## 2023-06-25 ENCOUNTER — Other Ambulatory Visit: Payer: Commercial Managed Care - PPO

## 2023-06-25 ENCOUNTER — Encounter: Payer: Self-pay | Admitting: Neurology

## 2023-06-25 VITALS — BP 109/77 | HR 71 | Ht 70.0 in | Wt 242.0 lb

## 2023-06-25 DIAGNOSIS — G4459 Other complicated headache syndrome: Secondary | ICD-10-CM | POA: Diagnosis not present

## 2023-06-25 DIAGNOSIS — Z8673 Personal history of transient ischemic attack (TIA), and cerebral infarction without residual deficits: Secondary | ICD-10-CM | POA: Diagnosis not present

## 2023-06-25 DIAGNOSIS — G4482 Headache associated with sexual activity: Secondary | ICD-10-CM | POA: Diagnosis not present

## 2023-06-25 DIAGNOSIS — R2681 Unsteadiness on feet: Secondary | ICD-10-CM | POA: Diagnosis not present

## 2023-06-25 NOTE — Patient Instructions (Signed)
 Check MRI and MRA of head.  Further recommendations pending results. Check CBC and CMP

## 2023-06-26 LAB — CBC
HCT: 42.9 % (ref 35.0–45.0)
Hemoglobin: 14.5 g/dL (ref 11.7–15.5)
MCH: 30.5 pg (ref 27.0–33.0)
MCHC: 33.8 g/dL (ref 32.0–36.0)
MCV: 90.3 fL (ref 80.0–100.0)
MPV: 10.3 fL (ref 7.5–12.5)
Platelets: 356 10*3/uL (ref 140–400)
RBC: 4.75 10*6/uL (ref 3.80–5.10)
RDW: 12.2 % (ref 11.0–15.0)
WBC: 8.4 10*3/uL (ref 3.8–10.8)

## 2023-06-26 LAB — COMPREHENSIVE METABOLIC PANEL
AG Ratio: 1.4 (calc) (ref 1.0–2.5)
ALT: 33 U/L — ABNORMAL HIGH (ref 6–29)
AST: 20 U/L (ref 10–30)
Albumin: 4.4 g/dL (ref 3.6–5.1)
Alkaline phosphatase (APISO): 69 U/L (ref 31–125)
BUN: 15 mg/dL (ref 7–25)
CO2: 26 mmol/L (ref 20–32)
Calcium: 9.9 mg/dL (ref 8.6–10.2)
Chloride: 100 mmol/L (ref 98–110)
Creat: 0.71 mg/dL (ref 0.50–0.97)
Globulin: 3.1 g/dL (ref 1.9–3.7)
Glucose, Bld: 83 mg/dL (ref 65–99)
Potassium: 4.4 mmol/L (ref 3.5–5.3)
Sodium: 137 mmol/L (ref 135–146)
Total Bilirubin: 0.3 mg/dL (ref 0.2–1.2)
Total Protein: 7.5 g/dL (ref 6.1–8.1)

## 2023-06-30 ENCOUNTER — Ambulatory Visit
Admission: RE | Admit: 2023-06-30 | Discharge: 2023-06-30 | Disposition: A | Discharge status: Home / Self Care | Payer: Commercial Managed Care - PPO | Source: Ambulatory Visit | Attending: Neurology | Admitting: Neurology

## 2023-06-30 DIAGNOSIS — R2681 Unsteadiness on feet: Secondary | ICD-10-CM

## 2023-06-30 DIAGNOSIS — G4459 Other complicated headache syndrome: Secondary | ICD-10-CM | POA: Insufficient documentation

## 2023-06-30 DIAGNOSIS — R519 Headache, unspecified: Secondary | ICD-10-CM | POA: Diagnosis not present

## 2023-06-30 DIAGNOSIS — G4482 Headache associated with sexual activity: Secondary | ICD-10-CM

## 2023-07-02 ENCOUNTER — Telehealth: Payer: Self-pay | Admitting: Neurology

## 2023-07-02 ENCOUNTER — Other Ambulatory Visit: Payer: Self-pay | Admitting: Neurology

## 2023-07-02 ENCOUNTER — Other Ambulatory Visit: Payer: Self-pay

## 2023-07-02 MED ORDER — TOPIRAMATE 25 MG PO TABS
25.0000 mg | ORAL_TABLET | Freq: Every day | ORAL | 5 refills | Status: DC
  Filled 2023-07-02: qty 30, 30d supply, fill #0

## 2023-07-02 NOTE — Telephone Encounter (Signed)
I called and spoke with patient over the phone about the MRI results.  MRI/MRA of head is unremarkable.  In fact, the prior cerebellar "strokes" seen on prior MRI is not seen, indicating artifact.  She continues to have disequilibrium (3 weeks now) in addition to head pressure.  Diagnosis of exclusion but consider migraine with aura.  Will start topiramate 25mg  at bedtime.  We can increase dose in 4 weeks if needed.  Advised that she may discontinue ASA as she never had a stroke.

## 2023-07-16 ENCOUNTER — Other Ambulatory Visit: Payer: Self-pay

## 2023-07-28 ENCOUNTER — Other Ambulatory Visit: Payer: Self-pay

## 2023-07-28 DIAGNOSIS — Z20828 Contact with and (suspected) exposure to other viral communicable diseases: Secondary | ICD-10-CM

## 2023-07-28 MED ORDER — OSELTAMIVIR PHOSPHATE 75 MG PO CAPS
75.0000 mg | ORAL_CAPSULE | Freq: Two times a day (BID) | ORAL | 0 refills | Status: AC
  Filled 2023-07-28: qty 14, 7d supply, fill #0

## 2023-07-29 ENCOUNTER — Other Ambulatory Visit: Payer: Self-pay

## 2023-07-30 ENCOUNTER — Other Ambulatory Visit: Payer: Self-pay

## 2023-07-30 DIAGNOSIS — B009 Herpesviral infection, unspecified: Secondary | ICD-10-CM

## 2023-07-30 MED ORDER — VALACYCLOVIR HCL 1 G PO TABS
2000.0000 mg | ORAL_TABLET | Freq: Two times a day (BID) | ORAL | 0 refills | Status: DC
  Filled 2023-07-30: qty 12, 3d supply, fill #0

## 2023-08-05 MED FILL — Escitalopram Oxalate Tab 20 MG (Base Equiv): ORAL | 90 days supply | Qty: 90 | Fill #1 | Status: AC

## 2023-08-11 ENCOUNTER — Encounter: Payer: Self-pay | Admitting: Emergency Medicine

## 2023-08-11 ENCOUNTER — Ambulatory Visit
Admission: EM | Admit: 2023-08-11 | Discharge: 2023-08-11 | Disposition: A | Discharge status: Home / Self Care | Attending: Physician Assistant | Admitting: Physician Assistant

## 2023-08-11 ENCOUNTER — Encounter: Payer: Self-pay | Admitting: Physician Assistant

## 2023-08-11 ENCOUNTER — Ambulatory Visit (INDEPENDENT_AMBULATORY_CARE_PROVIDER_SITE_OTHER)

## 2023-08-11 DIAGNOSIS — R0989 Other specified symptoms and signs involving the circulatory and respiratory systems: Secondary | ICD-10-CM | POA: Diagnosis not present

## 2023-08-11 DIAGNOSIS — R051 Acute cough: Secondary | ICD-10-CM | POA: Diagnosis not present

## 2023-08-11 DIAGNOSIS — R509 Fever, unspecified: Secondary | ICD-10-CM

## 2023-08-11 DIAGNOSIS — J101 Influenza due to other identified influenza virus with other respiratory manifestations: Secondary | ICD-10-CM | POA: Diagnosis not present

## 2023-08-11 DIAGNOSIS — R059 Cough, unspecified: Secondary | ICD-10-CM | POA: Diagnosis not present

## 2023-08-11 LAB — RESP PANEL BY RT-PCR (FLU A&B, COVID) ARPGX2
Influenza A by PCR: POSITIVE — AB
Influenza B by PCR: NEGATIVE
SARS Coronavirus 2 by RT PCR: NEGATIVE

## 2023-08-11 MED ORDER — OSELTAMIVIR PHOSPHATE 75 MG PO CAPS: 75.0000 mg | ORAL_CAPSULE | Freq: Two times a day (BID) | ORAL | 0 refills | Status: AC

## 2023-08-11 NOTE — Discharge Instructions (Signed)
-   Flu is positive.  You are within the window for treatment Tamiflu to potentially be helpful so I sent it to the pharmacy if you are positive. - Sent Tamiflu. Continue cough meds. - You need to isolate until you are fever free for 24 hours and symptoms are improving. - Increase rest and fluids. -I will call if radiologist sees pneumonia on your xray and send antibiotics. I will message you on Mychart even if negative results. - You should be seen again if you have uncontrolled fever, weakness or worsening breathing problem.

## 2023-08-11 NOTE — ED Provider Notes (Signed)
 [facility]    CSN: [id number] Arrival date & time: 08/11/23  0855      History   Chief Complaint Chief Complaint  Patient presents with   Cough    HPI Autumn [name] is a 38 y.o. female.    Today, patient is presenting for fever, fatigue, cough, congestion, headaches and body aches x 2 days.  Reports she was sick for 2 weeks previously.  Reports feeling better for about 5 days before return of symptoms.  States she has had thick chunks of yellowish mucus with a foul taste. Denies ear pain, sinus pain, chest pain, wheezing, shortness of breath, abdominal pain, vomiting or diarrhea.  Reports exposure to flu in the past week.  Took a couple days of Tamiflu prophylactically but did not finish course. Patient has been taking over-the-counter meds without relief. No other complaints.   HPI  Past Medical History:  Diagnosis Date   Anxiety    Back pain    chronic   Bilateral pulmonary embolism (HCC)    a. 06/2020 following COVID infection.   COVID-19 virus infection 06/2020   Depression    Disc disease, degenerative, lumbar or lumbosacral    Herpes zoster without complication 06/20/2018   History of echocardiogram    a. 06/2020 Echo: EF 55-60%, no rwma, nl RV size/fxn.   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 2/022 Zio: RSR, 85 (47-112). Rare PACs/PVCs. No significant arrhythmias/prolonged pauses.   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   MDD (major depressive disorder), recurrent episode, mild (HCC) 03/13/2021   History of cryptogenic stroke 12/09/2020   History of pulmonary embolism 12/09/2020   Preventative health care 12/08/2020   Prediabetes 06/28/2020   Migraine 04/08/2020   MDD (major depressive disorder), recurrent, in full remission (HCC) 02/10/2020   Other specified anxiety disorders 02/10/2020   Left carpal tunnel syndrome 01/12/2020   Chronic neck and back pain 04/16/2018   Contraception management 08/30/2016   Family history of breast cancer in mother 08/30/2016   Amenorrhea 12/25/2013   Anxiety and depression 07/15/2012   Herpes simplex virus (HSV) infection 11/01/2009    Past Surgical History:  Procedure Laterality Date   APPENDECTOMY     BREAST SURGERY Right 2008   lump/ benign   TEE WITHOUT CARDIOVERSION N/A 12/13/2020   Procedure: TRANSESOPHAGEAL ECHOCARDIOGRAM (TEE);  Surgeon: Yvonne Kendall, MD;  Location: [facility];  Service: Cardiovascular;  Laterality: N/A;    OB History     Gravida  1   Para  1   Term      Preterm      AB      Living  1      SAB      IAB      Ectopic      Multiple      Live Births               Home Medications    Prior to Admission medications   Medication Sig Start Date End Date Taking? Authorizing Provider  oseltamivir (TAMIFLU) 75 MG capsule Take 1 capsule (75 mg total) by mouth every 12 (twelve) hours for 5 days. 08/11/23 08/16/23 Yes Shirlee Latch, PA-C  acetaminophen (TYLENOL) 500 MG tablet Take 1,000 mg by mouth every 6 (six) hours as needed  (headaches/migraines/pain).    [provider]  aspirin 81 MG EC tablet Take by mouth.    [provider]  Cholecalciferol (VITAMIN D3) 50 MCG (2000 UT) TABS Take by mouth. Patient not taking: Reported on 06/25/2023    [provider]  clonazePAM (KLONOPIN) 0.5 MG tablet Take 1 tablet (0.5 mg total) by mouth daily as needed for anxiety. For severe panic attacks 05/16/21   Jomarie Longs, MD  escitalopram (LEXAPRO) 20 MG tablet Take 1 tablet (20 mg total) by mouth daily. 04/26/23   Jomarie Longs, MD  Ivermectin 1 % CREA Apply topically.    [provider]  topiramate (TOPAMAX) 25 MG tablet Take 1 tablet (25 mg total) by mouth at bedtime. 07/02/23   Drema Dallas, DO  valACYclovir (VALTREX) 1000 MG tablet Take 2 tablets (2,000 mg total) by mouth 2 (two) times daily for one day as needed for outbreaks. 07/30/23   Doreene Nest, NP    Family History Family History  Problem Relation Age of Onset   Breast cancer Mother 62       and 31   Atrial fibrillation Mother    Alcohol abuse Father    Vision loss Maternal Grandmother    Heart failure Maternal Grandmother    Cancer Maternal Grandfather    Ankylosing spondylitis Maternal Grandfather    Alcohol abuse Paternal Grandfather    Arthritis/Rheumatoid Maternal Aunt    Anxiety disorder Maternal Aunt    Depression Maternal Aunt    Ankylosing spondylitis Maternal Aunt    Atrial fibrillation Maternal Aunt     Social History Social History   Tobacco Use   Smoking status: Former    Current packs/day: 0.00    Types: Cigarettes    Start date: 08/19/2006    Quit date: 08/18/2016    Years since quitting: 6.9   Smokeless tobacco: Never   Tobacco comments:    3 cigarettes a day  Vaping Use   Vaping status: Never Used  Substance Use Topics   Alcohol use: Yes    Alcohol/week: 0.0 standard drinks of alcohol    Comment: occasional   Drug use: No     Allergies   Imitrex [sumatriptan], Buspirone,  Gadolinium derivatives, Iodinated contrast media, and Other   Review of Systems Review of Systems  Constitutional:  Positive for fatigue and fever. Negative for chills and diaphoresis.  HENT:  Positive for congestion and rhinorrhea. Negative for ear pain, sinus pressure, sinus pain and sore throat.   Respiratory:  Positive for cough. Negative for shortness of breath and wheezing.   Cardiovascular:  Negative for chest pain.  Gastrointestinal:  Negative for abdominal pain, nausea and vomiting.  Musculoskeletal:  Positive for myalgias.  Skin:  Negative for rash.  Neurological:  Positive for headaches. Negative for weakness.  Hematological:  Negative for adenopathy.     Physical Exam Triage Vital Signs ED Triage Vitals  Encounter Vitals Group     BP 08/11/23 0917 113/82     Systolic BP Percentile --      Diastolic BP Percentile --      Pulse Rate 08/11/23 0917 (!) 109     Resp 08/11/23 0917 14     Temp 08/11/23 0917 (!) 100.5 F (38.1 C)     Temp Source 08/11/23 0917 Oral     SpO2 08/11/23 0917 95 %     Weight 08/11/23 0915 241 lb (109.3 kg)     Height 08/11/23 0915 5\' 10"  (1.778 m)     Head Circumference --      Peak Flow --      Pain Score 08/11/23 0914 5     Pain Loc --      Pain Education --      Exclude from Growth Chart --    No data found.  Updated Vital Signs BP 113/82 (BP Location: Right Arm)   Pulse (!) 109   Temp (!) 100.5 F (38.1 C) (Oral) Comment: Paitent took 600mg  Ibuprofen an hour ago.  Resp 14   Ht 5\' 10"  (1.778 m)   Wt 241 lb (109.3 kg)   LMP 07/28/2023 (Approximate)   SpO2 95%   BMI 34.58 kg/m     Physical Exam Vitals and nursing note reviewed.  Constitutional:      General: She is not in acute distress.    Appearance: Normal appearance. She is ill-appearing. She is not toxic-appearing.  HENT:     Head: Normocephalic and atraumatic.     Nose: Congestion present.     Mouth/Throat:     Mouth: Mucous membranes are moist.     Pharynx:  Oropharynx is clear.  Eyes:     General: No scleral icterus.       Right eye: No discharge.        Left eye: No discharge.     Conjunctiva/sclera: Conjunctivae normal.  Cardiovascular:     Rate and Rhythm: Regular rhythm. Tachycardia present.     Heart sounds: Normal heart sounds.  Pulmonary:     Effort: Pulmonary effort is normal. No respiratory distress.     Breath sounds: Normal breath sounds. No wheezing, rhonchi or rales.  Musculoskeletal:     Cervical back: Neck supple.  Skin:    General: Skin is dry.  Neurological:     General: No focal deficit present.     Mental Status: She is alert. Mental status is at baseline.     Motor: No weakness.     Gait: Gait normal.  Psychiatric:        Mood and Affect: Mood normal.        Behavior: Behavior normal.      UC Treatments / Results  Labs (all labs ordered are listed, but only abnormal results are displayed) Labs Reviewed  RESP PANEL BY RT-PCR (FLU A&B, COVID) ARPGX2 - Abnormal; Notable for the following components:      Result Value   Influenza A by PCR POSITIVE (*)    All other components within normal limits    EKG   Radiology DG Chest 2 View Result Date: 08/11/2023 CLINICAL DATA:  Cough and congestion.  Fevers. EXAM: CHEST - 2 VIEW COMPARISON:  06/21/2020 FINDINGS: The heart size and mediastinal contours  are within normal limits. Both lungs are clear. The visualized skeletal structures are unremarkable. IMPRESSION: No active cardiopulmonary disease. Electronically Signed   By: Signa Kell M.D.   On: 08/11/2023 10:23    Procedures Procedures (including critical care time)  Medications Ordered in UC Medications - No data to display  Initial Impression / Assessment and Plan / UC Course  I have reviewed the triage vital signs and the nursing notes.  Pertinent labs & imaging results that were available during my care of the patient were reviewed by me and considered in my medical decision making (see chart for  details).   38 year old female presents for fever, fatigue, headaches and bodyaches, cough and congestion x 2 days.  Patient was sick for a couple weeks, better for 5 days before return of symptoms with associated fever and productive cough.  Also exposure to flu.  Current temperature 100.5 degrees.  Pulse elevated at 109 bpm.  She is ill-appearing but nontoxic.  On exam has nasal congestion.  Throat is clear.  Chest clear to auscultation heart regular rhythm but tachycardic.  Respiratory panel obtained by nursing staff.   Chest x-ray ordered to assess for possible pneumonia.  Positive influenza A.  Reviewed current CDC guidelines, isolation protocol and ED precautions for flu.  Sent Tamiflu to pharmacy.  Patient advised to take over-the-counter Mucinex.  States Promethazine DM and hydrocodone cough syrup have both made her sick and she is not sure what other cough medicine she can take besides Mucinex.  Advised her that I will contact her if the radiologist sees pneumonia on her x-ray and send antibiotics.  Work note given.  Acute illness with systemic symptoms.  Normal chest x-ray. Sent patient a MyChart message with results.    Final Clinical Impressions(s) / UC Diagnoses   Final diagnoses:  Acute cough  Influenza A  Fever, unspecified     Discharge Instructions      - Flu is positive.  You are within the window for treatment Tamiflu to potentially be helpful so I sent it to the pharmacy if you are positive. - Sent Tamiflu. Continue cough meds. - You need to isolate until you are fever free for 24 hours and symptoms are improving. - Increase rest and fluids. -I will call if radiologist sees pneumonia on your xray and send antibiotics. I will message you on Mychart even if negative results. - You should be seen again if you have uncontrolled fever, weakness or worsening breathing problem.     ED Prescriptions     Medication Sig Dispense Auth. Provider   oseltamivir  (TAMIFLU) 75 MG capsule Take 1 capsule (75 mg total) by mouth every 12 (twelve) hours for 5 days. 10 capsule Shirlee Latch, PA-C      PDMP not reviewed this encounter.   Shirlee Latch, PA-C 08/11/23 1037

## 2023-08-11 NOTE — ED Triage Notes (Signed)
 Patient c/o cough, bodyaches, chest congestion and headaches that started 2 days ago.  Patient unsure of fevers.

## 2023-08-19 ENCOUNTER — Ambulatory Visit: Payer: Commercial Managed Care - PPO | Admitting: Psychiatry

## 2023-08-21 ENCOUNTER — Ambulatory Visit: Payer: Commercial Managed Care - PPO | Admitting: Psychiatry

## 2023-08-22 ENCOUNTER — Ambulatory Visit: Payer: Self-pay | Admitting: Psychiatry

## 2023-10-21 ENCOUNTER — Other Ambulatory Visit: Payer: Self-pay

## 2023-10-21 ENCOUNTER — Encounter: Payer: Self-pay | Admitting: Psychiatry

## 2023-10-21 ENCOUNTER — Ambulatory Visit (INDEPENDENT_AMBULATORY_CARE_PROVIDER_SITE_OTHER): Admitting: Psychiatry

## 2023-10-21 VITALS — BP 108/76 | HR 80 | Temp 97.6°F | Ht 70.0 in | Wt 252.2 lb

## 2023-10-21 DIAGNOSIS — F331 Major depressive disorder, recurrent, moderate: Secondary | ICD-10-CM | POA: Insufficient documentation

## 2023-10-21 DIAGNOSIS — F5081 Binge eating disorder, mild: Secondary | ICD-10-CM | POA: Insufficient documentation

## 2023-10-21 DIAGNOSIS — F439 Reaction to severe stress, unspecified: Secondary | ICD-10-CM

## 2023-10-21 DIAGNOSIS — F418 Other specified anxiety disorders: Secondary | ICD-10-CM | POA: Diagnosis not present

## 2023-10-21 MED ORDER — TOPIRAMATE 25 MG PO TABS
ORAL_TABLET | ORAL | 0 refills | Status: DC
  Filled 2023-10-21: qty 45, 30d supply, fill #0

## 2023-10-21 NOTE — Progress Notes (Signed)
 [facility] MD OP Progress Note  10/21/2023 11:05 AM Autumn [name]  MRN:  [id number]  Chief Complaint:  Chief Complaint  Patient presents with   Follow-up   Depression   Anxiety   Eating Disorder   Medication Refill   Discussed the use of AI scribe software for clinical note transcription with the patient, who gave verbal consent to proceed.  History of Present Illness Autumn [name] is a 38 year old Caucasian female, employed, separated, lives in [place], has a history of MDD, other specified anxiety disorder, prediabetes, history of tachycardia, history of bilateral pulmonary embolism on anticoagulation was evaluated in office today presented for a follow-up appointment.  She has been experiencing binge eating episodes for over a year, with a notable worsening since January. These episodes involve consuming large quantities of food even when not hungry, often feeling compelled to finish all the food on her plate. Post-episode, she feels disgusted and guilty but does not engage in purging behaviors. Her weight has increased to over 250 pounds, which she finds distressing.  Over the past four months, she has been experiencing significant emotional distress, feeling depressed and overwhelmed by personal and familial issues. She describes feeling 'disgusted' with herself and lacking motivation. Although she has been attending therapy sessions with her partner since January, she has not discussed her eating issues in detail during these sessions. She experiences disturbing dreams nightly with themes of war and personal loss, contributing to her reluctance to sleep. She has family in [place], which may be contributing to her distress, although she does not frequently discuss the situation with them.  She is currently taking Lexapro  20 mg in the mornings. She has been prescribed Topamax  for potential migraine episodes but has not yet started it. She has discussed the possibility of trying  Wellbutrin  with her primary care provider but has not started any new medications for her eating disorder.  She denies any current suicidality, homicidality or perceptual disturbances.  She works at [employer] and lives with her partner. She has a son involved in theater and school activities, impacting her schedule. Her partner assists with caregiving, but their work as a Doctor, general practice. Her mother also provides some support. She was previously a member of a gym but stopped due to her son's schedule and lack of motivation.   Visit Diagnosis:    ICD-10-CM   1. MDD (major depressive disorder), recurrent episode, moderate (HCC)  F33.1 topiramate  (TOPAMAX ) 25 MG tablet    2. Other specified anxiety disorders  F41.8    With limited symptom attacks    3. Mild binge-eating disorder  F50.810     4. Trauma and stressor-related disorder  F43.9    Unspecified, rule out PTSD      Past Psychiatric History: I have reviewed past psychiatric history from progress note on 02/10/2020.  Past trials of Lexapro , Wellbutrin -noncompliant.  Reviewed the past psychiatric history from progress note on 02/10/2020.  Past Medical History:  Past Medical History:  Diagnosis Date   Anxiety    Back pain    chronic   Bilateral pulmonary embolism (HCC)    a. 06/2020 following COVID infection.   COVID-19 virus infection 06/2020   Depression    Disc disease, degenerative, lumbar or lumbosacral    Herpes zoster without complication 06/20/2018   History of echocardiogram    a. 06/2020 Echo: EF 55-60%, no rwma, nl RV size/fxn.   History of ovarian cyst    Imbalance 10/25/2020  Lacunar stroke (HCC) 12/08/2020   Left upper quadrant abdominal pain 10/23/2019   Lightheadedness 05/25/2021   Lung nodule 06/22/2020   Migraine    Nonallopathic lesion of cervical region 04/16/2018   Nonallopathic lesion of rib cage 04/16/2018   Nonallopathic lesion of thoracic region 04/16/2018   OME (otitis media with  effusion), right 12/06/2020   Palpitations    a. 2/022 Zio: RSR, 85 (47-112). Rare PACs/PVCs. No significant arrhythmias/prolonged pauses.   Paresthesia 06/30/2019   Prediabetes    Scoliosis    Shingles 06/20/2018   Stroke (HCC) 2022   Remote lacunar strokes noted in the cerebellum by MRI    Past Surgical History:  Procedure Laterality Date   APPENDECTOMY     BREAST SURGERY Right 2008   lump/ benign   TEE WITHOUT CARDIOVERSION N/A 12/13/2020   Procedure: TRANSESOPHAGEAL ECHOCARDIOGRAM (TEE);  Surgeon: Sammy Crisp, MD;  Location: [facility] ORS;  Service: Cardiovascular;  Laterality: N/A;    Family Psychiatric History: I have reviewed family psychiatric history from progress note on 02/10/2020.  Family History:  Family History  Problem Relation Age of Onset   Breast cancer Mother 56       and 36   Atrial fibrillation Mother    Alcohol abuse Father    Vision loss Maternal Grandmother    Heart failure Maternal Grandmother    Cancer Maternal Grandfather    Ankylosing spondylitis Maternal Grandfather    Alcohol abuse Paternal Grandfather    Arthritis/Rheumatoid Maternal Aunt    Anxiety disorder Maternal Aunt    Depression Maternal Aunt    Ankylosing spondylitis Maternal Aunt    Atrial fibrillation Maternal Aunt     Social History: I have reviewed social history from progress note on 02/10/2020. Social History   Socioeconomic History   Marital status: Divorced    Spouse name: Not on file   Number of children: 1   Years of education: Not on file   Highest education level: Associate degree: academic program  Occupational History   Occupation: Futures trader  Tobacco Use   Smoking status: Former    Current packs/day: 0.00    Types: Cigarettes    Start date: 08/19/2006    Quit date: 08/18/2016    Years since quitting: 7.1   Smokeless tobacco: Never   Tobacco comments:    3 cigarettes a day  Vaping Use   Vaping status: Never Used  Substance and Sexual Activity   Alcohol use:  Yes    Alcohol/week: 0.0 standard drinks of alcohol    Comment: occasional   Drug use: No   Sexual activity: Yes    Birth control/protection: Pill  Other Topics Concern   Not on file  Social History Narrative   Right handed   Social Drivers of Health   Financial Resource Strain: Not on file  Food Insecurity: Not on file  Transportation Needs: Not on file  Physical Activity: Not on file  Stress: Not on file  Social Connections: Not on file    Allergies:  Allergies  Allergen Reactions   Imitrex [Sumatriptan] Other (See Comments)    Vomiting, Slurred Speech and Facial Numbness   Buspirone  Other (See Comments)   Gadolinium Derivatives Other (See Comments)    Lip tingling, scratchy throat   Iodinated Contrast Media Itching and Cough    Patient experienced sneezing, throat itching, and "feeling like needing to cough" Given 25 mg benadryl and observed x 20 mins/MMS Per dr Debara Faden patient should have 13 hour prep  Other Itching    States reaction to contrast dye used for CTA of her brain - itching of tongue, tingling lower lip, coughing    Metabolic Disorder Labs: Lab Results  Component Value Date   HGBA1C 5.9 (A) 02/14/2023   No results found for: "PROLACTIN" Lab Results  Component Value Date   CHOL 154 02/14/2023   TRIG 79.0 02/14/2023   HDL 51.10 02/14/2023   CHOLHDL 3 02/14/2023   VLDL 15.8 02/14/2023   LDLCALC 87 02/14/2023   LDLCALC 77 01/19/2022   Lab Results  Component Value Date   TSH 2.03 12/30/2020   TSH 1.029 07/08/2020    Therapeutic Level Labs: No results found for: "LITHIUM" No results found for: "VALPROATE" No results found for: "CBMZ"  Current Medications: Current Outpatient Medications  Medication Sig Dispense Refill   acetaminophen  (TYLENOL ) 500 MG tablet Take 1,000 mg by mouth every 6 (six) hours as needed (headaches/migraines/pain).     clonazePAM  (KLONOPIN ) 0.5 MG tablet Take 1 tablet (0.5 mg total) by mouth daily as needed for anxiety.  For severe panic attacks 15 tablet 1   escitalopram  (LEXAPRO ) 20 MG tablet Take 1 tablet (20 mg total) by mouth daily. 90 tablet 1   Ivermectin 1 % CREA Apply topically.     topiramate  (TOPAMAX ) 25 MG tablet Take 1 tablet (25 mg total) by mouth daily for 15 days, THEN 1 tablet (25 mg total) 2 (two) times daily for 15 days. 45 tablet 0   valACYclovir  (VALTREX ) 1000 MG tablet Take 2 tablets (2,000 mg total) by mouth 2 (two) times daily for one day as needed for outbreaks. 12 tablet 0   aspirin  81 MG EC tablet Take by mouth. (Patient not taking: Reported on 10/21/2023)     Cholecalciferol (VITAMIN D3) 50 MCG (2000 UT) TABS Take by mouth. (Patient not taking: Reported on 10/21/2023)     No current facility-administered medications for this visit.     Musculoskeletal: Strength & Muscle Tone: within normal limits Gait & Station: normal Patient leans: N/A  Psychiatric Specialty Exam: Review of Systems  Psychiatric/Behavioral:  Positive for dysphoric mood and sleep disturbance. The patient is nervous/anxious.     Blood pressure 108/76, pulse 80, temperature 97.6 F (36.4 C), temperature source Temporal, height 5\' 10"  (1.778 m), weight 252 lb 3.2 oz (114.4 kg).Body mass index is 36.19 kg/m.  General Appearance: Casual  Eye Contact:  Fair  Speech:  Clear and Coherent  Volume:  Normal  Mood:  Anxious and Depressed  Affect:  Tearful  Thought Process:  Goal Directed and Descriptions of Associations: Intact  Orientation:  Full (Time, Place, and Person)  Thought Content: Logical   Suicidal Thoughts:  No  Homicidal Thoughts:  No  Memory:  Immediate;   Fair Recent;   Fair Remote;   Fair  Judgement:  Fair  Insight:  Fair  Psychomotor Activity:  Normal  Concentration:  Concentration: Fair and Attention Span: Fair  Recall:  [employer] of Knowledge: Fair  Language: Fair  Akathisia:  No  Handed:  Right  AIMS (if indicated): not done  Assets:  Desire for Improvement Housing Social  Support Transportation  ADL's:  Intact  Cognition: WNL  Sleep:  Varies   Screenings: AIMS    Flowsheet Row Video Visit from 11/20/2021 in [facility]  AIMS Total Score 0      GAD-7    Flowsheet Row Office Visit from 10/21/2023 in [facility] Office  Visit from 02/14/2023 in [facility] Office Visit from 07/20/2022 in [facility] Video Visit from 11/20/2021 in [facility] Video Visit from 06/20/2021 in [facility]  Total GAD-7 Score 2 2 3 2 2       PHQ2-9    Flowsheet Row Office Visit from 10/21/2023 in [facility] Video Visit from 02/25/2023 in [facility] Office Visit from 02/14/2023 in [facility] Office Visit from 07/20/2022 in [facility] Office Visit from 01/18/2022 in [facility]  PHQ-2 Total Score 4 0 2 2 0  PHQ-9 Total Score 13 -- 5 5 7       Flowsheet Row Office Visit from 10/21/2023 in [facility] UC from 08/11/2023 in [facility]  Video Visit from 02/25/2023 in [facility]  C-SSRS RISK CATEGORY Low Risk No Risk No Risk        Assessment and Plan: Autumn [name] is a 38 year old Caucasian female, employed, lives in [place], has a history of depression, anxiety, recent episodes of binge eating, was evaluated in office today, discussed assessment and plan as noted below.  MDD-unstable Patient with current depression symptoms which includes lack of motivation, sleep problems, sadness correlated with her binge eating episodes, body image problems.  She is currently in  psychotherapy and is motivated to stay in therapy.  Agreeable to medication readjustment.  Discussed changing Lexapro  to Fluoxetine how she is not interested at this time.  Would like the addition of a mood stabilizer like topiramate . - Continue Lexapro  20 mg daily - Patient advised to have more frequent sessions with her psychotherapist at [facility] - Add topiramate  as noted below.  Binge eating disorder-unstable She indicates that she often feels she has no control over her eating, continues to eat when not hungry, and always feels embarrassed and disgusted after overeating. No bulimic behaviors. - Discussed addition of Fluoxetine and tapering off of Lexapro .  Patient declines. - Start Topiramate  25 mg daily for 15 days and increase to 25 mg twice daily. - Patient to reach out to psychotherapist to have more frequent sessions. - Consider referral to eating disorder clinic if symptoms worsens.  Anxiety disorder-unstable Patient with ongoing anxiety symptoms mostly related to current situational stressors. - Would like to stay on Lexapro  20 mg, continue the same. - Patient to have more frequent psychotherapy sessions.  Trauma and stress related disorder unspecified-rule out PTSD-unstable Patient with intrusive memories nightmares about [place], does have family care likely contributing to trauma related symptoms. - Continue Lexapro  as prescribed. - Will consider addition of the sleep medication as needed in the future. - Patient to have more frequent psychotherapy sessions.   Follow-up - Follow-up in clinic in 4 weeks or sooner if needed.  Collaboration of Care: Collaboration of Care: As needed in the future  Patient/Guardian was advised Release of Information must be obtained prior to any record release in order to collaborate their care with an outside provider. Patient/Guardian was advised if they have not already done so to contact the registration department to sign all  necessary forms in order for us  to release information regarding their care.   Consent: Patient/Guardian gives verbal consent for treatment and assignment of benefits for services provided during  this visit. Patient/Guardian expressed understanding and agreed to proceed.  This note was generated in part or whole with voice recognition software. Voice recognition is usually quite accurate but there are transcription errors that can and very often do occur. I apologize for any typographical errors that were not detected and corrected.     Toluwani Ruder, MD 10/21/2023, 11:05 AM

## 2023-10-28 ENCOUNTER — Telehealth: Admitting: Nurse Practitioner

## 2023-10-28 ENCOUNTER — Other Ambulatory Visit
Admission: RE | Admit: 2023-10-28 | Discharge: 2023-10-28 | Disposition: A | Discharge status: Home / Self Care | Attending: Nurse Practitioner | Admitting: Nurse Practitioner

## 2023-10-28 ENCOUNTER — Ambulatory Visit: Payer: Self-pay | Admitting: Nurse Practitioner

## 2023-10-28 ENCOUNTER — Other Ambulatory Visit: Payer: Self-pay

## 2023-10-28 VITALS — Ht 70.0 in

## 2023-10-28 DIAGNOSIS — R300 Dysuria: Secondary | ICD-10-CM

## 2023-10-28 DIAGNOSIS — N3090 Cystitis, unspecified without hematuria: Secondary | ICD-10-CM

## 2023-10-28 LAB — URINALYSIS, MICROSCOPIC (REFLEX): RBC / HPF: NONE SEEN RBC/hpf (ref 0–5)

## 2023-10-28 LAB — URINALYSIS, ROUTINE W REFLEX MICROSCOPIC
Bilirubin Urine: NEGATIVE
Glucose, UA: NEGATIVE mg/dL
Hgb urine dipstick: NEGATIVE
Ketones, ur: NEGATIVE mg/dL
Leukocytes,Ua: NEGATIVE
Nitrite: POSITIVE — AB
Protein, ur: NEGATIVE mg/dL
Specific Gravity, Urine: >1.03 — ABNORMAL HIGH (ref 1.005–1.030)
pH: 7 (ref 5.0–8.0)

## 2023-10-28 MED ORDER — NITROFURANTOIN MONOHYD MACRO 100 MG PO CAPS
100.0000 mg | ORAL_CAPSULE | Freq: Two times a day (BID) | ORAL | 0 refills | Status: DC
  Filled 2023-10-28: qty 10, 5d supply, fill #0

## 2023-10-28 NOTE — Assessment & Plan Note (Signed)
 UA positive.  Pending urine culture.  Start patient on Macrobid  100 mg twice daily for 5 days.  Patient can use Pyridium or AZO for the next 2 days and then stop to make sure antibiotic is effective encouraged adequate hydration as patient specific gravity was elevated

## 2023-10-28 NOTE — Assessment & Plan Note (Signed)
 UA in office

## 2023-10-28 NOTE — Progress Notes (Signed)
 Ph: [phone] Fax: [phone]   Patient ID: Autumn [name], female    DOB: 1985-11-19, 38 y.o.   MRN: [id number]  Virtual visit completed through MyChart, a video enabled telemedicine application. Due to national recommendations of social distancing due to COVID-19, a virtual visit is felt to be most appropriate for this patient at this time. Reviewed limitations, risks, security and privacy concerns of performing a virtual visit and the availability of in person appointments. I also reviewed that there may be a patient responsible charge related to this service. The patient agreed to proceed.   Patient location: at work  Provider location: Adult nurse at [facility], office Persons participating in this virtual visit: patient, provider   If any vitals were documented, they were collected by patient at home unless specified below.    Ht 5\' 10"  (1.778 m)   LMP 10/24/2023 (Exact Date)   BMI 36.19 kg/m    CC: dysuria  Subjective:   HPI: Autumn [name] is a 38 y.o. female presenting on 10/28/2023 for Urinary Tract Infection (Pt complains of burning, urinary frequency, and chills that started Thursday. Slight lower abdominal pain with nausea. OTC taken is AZO. )    Symptoms started on Thursday. States that she had a slight burning with sometimes when she was urinating. This was Thursday and Friday. She was on her period at that point and thought it was irritation from the pad. On Saturday it burned when she urinated and she had the sense of frequency and having to push and push. States that after that she had some chills, nausea, and lower abdominal pressure. She did have some left over cipro  500mg . States that she took 1 bid  Saturday and then one Sunday and one today. States that she has been using AZO.   Relevant past medical, surgical, family and social history reviewed and updated as indicated. Interim medical history since our last visit reviewed. Allergies and  medications reviewed and updated. Outpatient Medications Prior to Visit  Medication Sig Dispense Refill   acetaminophen  (TYLENOL ) 500 MG tablet Take 1,000 mg by mouth every 6 (six) hours as needed (headaches/migraines/pain).     clonazePAM  (KLONOPIN ) 0.5 MG tablet Take 1 tablet (0.5 mg total) by mouth daily as needed for anxiety. For severe panic attacks 15 tablet 1   escitalopram  (LEXAPRO ) 20 MG tablet Take 1 tablet (20 mg total) by mouth daily. 90 tablet 1   Ivermectin 1 % CREA Apply topically.     topiramate  (TOPAMAX ) 25 MG tablet Take 1 tablet (25 mg total) by mouth daily for 15 days, THEN 1 tablet (25 mg total) 2 (two) times daily for 15 days. 45 tablet 0   valACYclovir  (VALTREX ) 1000 MG tablet Take 2 tablets (2,000 mg total) by mouth 2 (two) times daily for one day as needed for outbreaks. 12 tablet 0   Cholecalciferol (VITAMIN D3) 50 MCG (2000 UT) TABS Take by mouth. (Patient not taking: Reported on 10/28/2023)     aspirin  81 MG EC tablet Take by mouth. (Patient not taking: Reported on 10/28/2023)     No facility-administered medications prior to visit.     Per HPI unless specifically indicated in ROS section below Review of Systems  Constitutional:  Negative for chills and fever.  Gastrointestinal:  Positive for abdominal pain and nausea.  Genitourinary:  Positive for dysuria, frequency and urgency. Negative for hematuria and vaginal discharge.  Musculoskeletal:  Negative for back pain.   Objective:  Ht 5\' 10"  (1.778  m)   LMP 10/24/2023 (Exact Date)   BMI 36.19 kg/m   Wt Readings from Last 3 Encounters:  08/11/23 241 lb (109.3 kg)  06/25/23 242 lb (109.8 kg)  02/14/23 236 lb (107 kg)       Physical exam: Gen: alert, NAD, not ill appearing Pulm: speaks in complete sentences without increased work of breathing Psych: normal mood, normal thought content      Results for orders placed or performed during the hospital encounter of 10/28/23  Urinalysis, Routine w reflex  microscopic -   Collection Time: 10/28/23 10:38 AM  Result Value Ref Range   Color, Urine YELLOW YELLOW   APPearance CLEAR CLEAR   Specific Gravity, Urine >1.030 (H) 1.005 - 1.030   pH 7.0 5.0 - 8.0   Glucose, UA NEGATIVE NEGATIVE mg/dL   Hgb urine dipstick NEGATIVE NEGATIVE   Bilirubin Urine NEGATIVE NEGATIVE   Ketones, ur NEGATIVE NEGATIVE mg/dL   Protein, ur NEGATIVE NEGATIVE mg/dL   Nitrite POSITIVE (A) NEGATIVE   Leukocytes,Ua NEGATIVE NEGATIVE  Urinalysis, Microscopic (reflex)   Collection Time: 10/28/23 10:38 AM  Result Value Ref Range   RBC / HPF NONE SEEN 0 - 5 RBC/hpf   WBC, UA 0-5 0 - 5 WBC/hpf   Bacteria, UA FEW (A) NONE SEEN   Squamous Epithelial / HPF 0-5 0 - 5 /HPF   Assessment & Plan:   Dysuria Assessment & Plan: UA in office  Orders: -     Urine Culture; Future -     Urinalysis, Routine w reflex microscopic; Future  Cystitis Assessment & Plan: UA positive.  Pending urine culture.  Start patient on Macrobid  100 mg twice daily for 5 days.  Patient can use Pyridium or AZO for the next 2 days and then stop to make sure antibiotic is effective encouraged adequate hydration as patient specific gravity was elevated  Orders: -     Nitrofurantoin  Monohyd Macro; Take 1 capsule (100 mg total) by mouth 2 (two) times daily.  Dispense: 10 capsule; Refill: 0     I discussed the assessment and treatment plan with the patient. The patient was provided an opportunity to ask questions and all were answered. The patient agreed with the plan and demonstrated an understanding of the instructions. The patient was advised to call back or seek an in-person evaluation if the symptoms worsen or if the condition fails to improve as anticipated.  Follow up plan: Return if symptoms worsen or fail to improve.  Margarie Shay, NP

## 2023-10-29 LAB — URINE CULTURE: Culture: NO GROWTH

## 2023-11-11 ENCOUNTER — Ambulatory Visit
Admission: RE | Admit: 2023-11-11 | Discharge: 2023-11-11 | Disposition: A | Discharge status: Home / Self Care | Source: Ambulatory Visit | Attending: Primary Care | Admitting: Primary Care

## 2023-11-11 DIAGNOSIS — Z1231 Encounter for screening mammogram for malignant neoplasm of breast: Secondary | ICD-10-CM

## 2023-11-11 DIAGNOSIS — Z803 Family history of malignant neoplasm of breast: Secondary | ICD-10-CM

## 2023-11-15 ENCOUNTER — Other Ambulatory Visit: Payer: Self-pay

## 2023-11-15 ENCOUNTER — Encounter: Payer: Self-pay | Admitting: Psychiatry

## 2023-11-15 ENCOUNTER — Ambulatory Visit: Payer: Self-pay | Admitting: Primary Care

## 2023-11-15 ENCOUNTER — Other Ambulatory Visit
Admission: RE | Admit: 2023-11-15 | Discharge: 2023-11-15 | Disposition: A | Discharge status: Home / Self Care | Attending: Psychiatry | Admitting: Psychiatry

## 2023-11-15 ENCOUNTER — Telehealth: Admitting: Psychiatry

## 2023-11-15 DIAGNOSIS — F331 Major depressive disorder, recurrent, moderate: Secondary | ICD-10-CM | POA: Diagnosis not present

## 2023-11-15 DIAGNOSIS — F439 Reaction to severe stress, unspecified: Secondary | ICD-10-CM

## 2023-11-15 DIAGNOSIS — F418 Other specified anxiety disorders: Secondary | ICD-10-CM

## 2023-11-15 DIAGNOSIS — Z79899 Other long term (current) drug therapy: Secondary | ICD-10-CM | POA: Insufficient documentation

## 2023-11-15 DIAGNOSIS — F5081 Binge eating disorder, mild: Secondary | ICD-10-CM | POA: Diagnosis not present

## 2023-11-15 MED ORDER — TOPIRAMATE 25 MG PO TABS
25.0000 mg | ORAL_TABLET | Freq: Two times a day (BID) | ORAL | 1 refills | Status: DC
  Filled 2023-11-15: qty 60, 30d supply, fill #0
  Filled 2024-01-14: qty 60, 30d supply, fill #1

## 2023-11-15 MED ORDER — ESCITALOPRAM OXALATE 20 MG PO TABS
20.0000 mg | ORAL_TABLET | Freq: Every day | ORAL | 1 refills | Status: DC
  Filled 2023-11-15: qty 90, 90d supply, fill #0
  Filled 2024-02-28: qty 90, 90d supply, fill #1

## 2023-11-15 NOTE — Progress Notes (Signed)
 Virtual Visit via Video Note  I connected with Autumn [name] on 11/15/23 at  8:30 AM EDT by a video enabled telemedicine application and verified that I am speaking with the correct person using two identifiers.  Location Provider Location : [facility] Patient Location : Home  Participants: Patient , Provider   I discussed the limitations of evaluation and management by telemedicine and the availability of in person appointments. The patient expressed understanding and agreed to proceed.   I discussed the assessment and treatment plan with the patient. The patient was provided an opportunity to ask questions and all were answered. The patient agreed with the plan and demonstrated an understanding of the instructions.   The patient was advised to call back or seek an in-person evaluation if the symptoms worsen or if the condition fails to improve as anticipated.  BH MD OP Progress Note  11/15/2023 9:56 AM Autumn [name]  MRN:  [id number]  Chief Complaint:  Chief Complaint  Patient presents with   Follow-up   Anxiety   Depression   Medication Refill    Discussed the use of AI scribe software for clinical note transcription with the patient, who gave verbal consent to proceed.  History of Present Illness Autumn [name] is a 38 year old Caucasian female, employed, separated, lives in [place], has a history of MDD, other specified anxiety disorder, prediabetes, history of tachycardia, history of bilateral pulmonary embolism on anticoagulation was evaluated by telemedicine today.  She is currently managing her mood symptoms with Lexapro  20 mg daily and Topiramate  25 mg twice daily, with no reported side effects. These medications were adjusted during her last visit on May 12. She has been more mindful about her eating habits, avoiding double eating and sweets.  She believes that topiramate  may be helpful for the same as well.  It was initiated for her binge eating  habits.  Her therapist is on maternity leave, and she has been relying on friends for support. She plans to contact her therapist soon to discuss resuming therapy.  She recently experienced a breakup with her partner, which has added stress to her life. She lived together, and she is now navigating the logistics of her separation, including housing arrangements. She expresses concerns about the stability of her living situation and the potential impact on her and her son.  She has support from her mother and is trying to manage her responsibilities, including caring for her son and two dogs. She is also starting a new job in neurosurgery, which adds another layer of change to her current situation.  She has a history of using Klonopin , prescribed three years ago, and has two pills left from the original prescription.  She is interested in getting a refill in the future and agrees to get a urine drug screen done which she has been noncompliant with.  Denies thoughts of self-harm or harm to others.      Visit Diagnosis:    ICD-10-CM   1. MDD (major depressive disorder), recurrent episode, moderate (HCC)  F33.1 escitalopram  (LEXAPRO ) 20 MG tablet    2. Other specified anxiety disorders  F41.8 Urine drugs of abuse scrn w alc, routine (Ref Lab)   with limited symptom attacks    3. Mild binge-eating disorder  F50.810 topiramate  (TOPAMAX ) 25 MG tablet    4. Trauma and stressor-related disorder  F43.9    Unspecified rule out PTSD    5. High risk medication use  Z79.899 Urine drugs of abuse  scrn w alc, routine (Ref Lab)      Past Psychiatric History: I have reviewed past psychiatric history from progress note on 02/10/2020.  Past trials of Lexapro , Wellbutrin -noncompliant.   Past Medical History:  Past Medical History:  Diagnosis Date   Anxiety    Back pain    chronic   Bilateral pulmonary embolism (HCC)    a. 06/2020 following COVID infection.   COVID-19 virus infection 06/2020    Depression    Disc disease, degenerative, lumbar or lumbosacral    Herpes zoster without complication 06/20/2018   History of echocardiogram    a. 06/2020 Echo: EF 55-60%, no rwma, nl RV size/fxn.   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 2/022 Zio: RSR, 85 (47-112). Rare PACs/PVCs. No significant arrhythmias/prolonged pauses.   Paresthesia 06/30/2019   Prediabetes    Scoliosis    Shingles 06/20/2018   Stroke (HCC) 2022   Remote lacunar strokes noted in the cerebellum by MRI    Past Surgical History:  Procedure Laterality Date   APPENDECTOMY     BREAST SURGERY Right 2008   lump/ benign   TEE WITHOUT CARDIOVERSION N/A 12/13/2020   Procedure: TRANSESOPHAGEAL ECHOCARDIOGRAM (TEE);  Surgeon: Sammy Crisp, MD;  Location: [facility] ORS;  Service: Cardiovascular;  Laterality: N/A;    Family Psychiatric History: I have reviewed family psychiatric history from progress note on 02/10/2020.  Family History:  Family History  Problem Relation Age of Onset   Breast cancer Mother 59       and 31   Atrial fibrillation Mother    Alcohol abuse Father    Vision loss Maternal Grandmother    Heart failure Maternal Grandmother    Cancer Maternal Grandfather    Ankylosing spondylitis Maternal Grandfather    Alcohol abuse Paternal Grandfather    Arthritis/Rheumatoid Maternal Aunt    Anxiety disorder Maternal Aunt    Depression Maternal Aunt    Ankylosing spondylitis Maternal Aunt    Atrial fibrillation Maternal Aunt     Social History: I have reviewed social history from progress note on 02/10/2020. Social History    Socioeconomic History   Marital status: Divorced    Spouse name: Not on file   Number of children: 1   Years of education: Not on file   Highest education level: Associate degree: academic program  Occupational History   Occupation: Futures trader  Tobacco Use   Smoking status: Former    Current packs/day: 0.00    Types: Cigarettes    Start date: 08/19/2006    Quit date: 08/18/2016    Years since quitting: 7.2   Smokeless tobacco: Never   Tobacco comments:    3 cigarettes a day  Vaping Use   Vaping status: Never Used  Substance and Sexual Activity   Alcohol use: Yes    Alcohol/week: 0.0 standard drinks of alcohol    Comment: occasional   Drug use: No   Sexual activity: Yes    Birth control/protection: Pill  Other Topics Concern   Not on file  Social History Narrative   Right handed   Social Drivers  of Health   Financial Resource Strain: Not on file  Food Insecurity: Not on file  Transportation Needs: Not on file  Physical Activity: Not on file  Stress: Not on file  Social Connections: Not on file    Allergies:  Allergies  Allergen Reactions   Imitrex [Sumatriptan] Other (See Comments)    Vomiting, Slurred Speech and Facial Numbness   Buspirone  Other (See Comments)   Gadolinium Derivatives Other (See Comments)    Lip tingling, scratchy throat   Iodinated Contrast Media Itching and Cough    Patient experienced sneezing, throat itching, and "feeling like needing to cough" Given 25 mg benadryl and observed x 20 mins/MMS Per dr Debara Faden patient should have 13 hour prep   Other Itching    States reaction to contrast dye used for CTA of her brain - itching of tongue, tingling lower lip, coughing    Metabolic Disorder Labs: Lab Results  Component Value Date   HGBA1C 5.9 (A) 02/14/2023   No results found for: "PROLACTIN" Lab Results  Component Value Date   CHOL 154 02/14/2023   TRIG 79.0 02/14/2023   HDL 51.10 02/14/2023   CHOLHDL 3 02/14/2023   VLDL 15.8  02/14/2023   LDLCALC 87 02/14/2023   LDLCALC 77 01/19/2022   Lab Results  Component Value Date   TSH 2.03 12/30/2020   TSH 1.029 07/08/2020    Therapeutic Level Labs: No results found for: "LITHIUM" No results found for: "VALPROATE" No results found for: "CBMZ"  Current Medications: Current Outpatient Medications  Medication Sig Dispense Refill   topiramate  (TOPAMAX ) 25 MG tablet Take 1 tablet (25 mg total) by mouth 2 (two) times daily. 60 tablet 1   acetaminophen  (TYLENOL ) 500 MG tablet Take 1,000 mg by mouth every 6 (six) hours as needed (headaches/migraines/pain).     Cholecalciferol (VITAMIN D3) 50 MCG (2000 UT) TABS Take by mouth. (Patient not taking: Reported on 10/28/2023)     clonazePAM  (KLONOPIN ) 0.5 MG tablet Take 1 tablet (0.5 mg total) by mouth daily as needed for anxiety. For severe panic attacks 15 tablet 1   escitalopram  (LEXAPRO ) 20 MG tablet Take 1 tablet (20 mg total) by mouth daily. 90 tablet 1   Ivermectin 1 % CREA Apply topically.     nitrofurantoin , macrocrystal-monohydrate, (MACROBID ) 100 MG capsule Take 1 capsule (100 mg total) by mouth 2 (two) times daily. 10 capsule 0   valACYclovir  (VALTREX ) 1000 MG tablet Take 2 tablets (2,000 mg total) by mouth 2 (two) times daily for one day as needed for outbreaks. 12 tablet 0   No current facility-administered medications for this visit.     Musculoskeletal: Strength & Muscle Tone: UTA Gait & Station: Seated Patient leans: N/A  Psychiatric Specialty Exam: Review of Systems  Psychiatric/Behavioral:  Positive for dysphoric mood. The patient is nervous/anxious.     Last menstrual period 10/24/2023.There is no height or weight on file to calculate BMI.  General Appearance: Casual  Eye Contact:  Fair  Speech:  Clear and Coherent  Volume:  Normal  Mood:  Anxious and Depressed improving  Affect:  Appropriate  Thought Process:  Goal Directed and Descriptions of Associations: Intact  Orientation:  Full (Time,  Place, and Person)  Thought Content: Logical   Suicidal Thoughts:  No  Homicidal Thoughts:  No  Memory:  Immediate;   Fair Recent;   Fair Remote;   Fair  Judgement:  Fair  Insight:  Fair  Psychomotor Activity:  Normal  Concentration:  Concentration: Fair and  Attention Span: Fair  Recall:  [employer] of Knowledge: Fair  Language: Fair  Akathisia:  No  Handed:  Right  AIMS (if indicated): not done  Assets:  Communication Skills Desire for Improvement Housing Social Support Transportation  ADL's:  Intact  Cognition: WNL  Sleep:  Fair   Screenings: AIMS    Flowsheet Row Video Visit from 11/20/2021 in [facility]  AIMS Total Score 0      GAD-7    Flowsheet Row Office Visit from 10/21/2023 in [facility] Office Visit from 02/14/2023 in [facility] [place] Office Visit from 07/20/2022 in [facility] Video Visit from 11/20/2021 in [facility] Video Visit from 06/20/2021 in [facility]  Total GAD-7 Score 2 2 3 2 2       PHQ2-9    Flowsheet Row Office Visit from 10/21/2023 in [facility] Video Visit from 02/25/2023 in [facility] Office Visit from 02[facility]14/2023 in [facility] Office Visit from 07/20/2022 in [facility] Visit from 01/18/2022 in [facility]  PHQ-2 Total Score 4 0 2 2 0  PHQ-9 Total Score 13 -- 5 5 7       Flowsheet Row Video Visit from 11/15/2023 in [facility] Office Visit from 10/21/2023 in [facility] UC from 08/11/2023 in [facility]   C-SSRS RISK CATEGORY No Risk Low  Risk No Risk        Assessment and Plan: Autumn [name] is a 38 year old Caucasian female, employed, lives in [place], has a history of depression, anxiety, was evaluated by telemedicine today.  Discussed assessment and plan as noted below.  MDD-unstable Continues to have depression symptoms although with some improvement.  Does have recent situational stressors currently going through break-up with her partner.Will benefit from restarting psychotherapy. Continue Lexapro  20 mg daily Restart psychotherapy sessions, provided resources in the community including Ms. Quirino Buckles.  Binge eating disorder-improving Currently more mindful about her eating habits.  Tolerating Topamax  well. Continue Topamax  25 mg twice daily Encouraged to restart CBT.  Anxiety disorder-unstable Currently with situational stressors which does contribute to anxiety although trying to make use of her support system and reports medications as beneficial.  Interested in continuing clonazepam  which he rarely uses.  Agrees to get a urine drug screen which is pending Continue Lexapro  20 mg daily Continue Clonazepam  0.5 mg daily as needed. Will await urine drug screen results prior to sending a refill.  Trauma and stress related disorder unspecified-rule out PTSD-improving Currently reports anxiety symptoms as improved and denies any recent nightmares. Continue Lexapro  20 mg daily Continue Topamax  which may also help with nightmares. Patient encouraged to reestablish care with therapist.  High risk medication use-ordered urine drug screen.  Patient to go to [facility] lab.  Follow-up Follow-up in clinic in 4 to 6 weeks or sooner if needed.   Collaboration of Care: Collaboration of Care: Referral or follow-up with counselor/therapist AEB encouraged to establish care with therapist.  Provider resources.  Patient/Guardian was advised Release of Information must be obtained prior to any record release in order to  collaborate their care with an outside provider. Patient/Guardian was advised if they have not already done so  to contact the registration department to sign all necessary forms in order for us  to release information regarding their care.   Consent: Patient/Guardian gives verbal consent for treatment and assignment of benefits for services provided during this visit. Patient/Guardian expressed understanding and agreed to proceed.   This note was generated in part or whole with voice recognition software. Voice recognition is usually quite accurate but there are transcription errors that can and very often do occur. I apologize for any typographical errors that were not detected and corrected.    Lareta Bruneau, MD 11/15/2023, 9:56 AM

## 2023-11-16 LAB — URINE DRUGS OF ABUSE SCREEN W ALC, ROUTINE (REF LAB)
Amphetamines, Urine: NEGATIVE ng/mL
Barbiturate, Ur: NEGATIVE ng/mL
Benzodiazepine Quant, Ur: NEGATIVE ng/mL
Cannabinoid Quant, Ur: NEGATIVE ng/mL
Cocaine (Metab.): NEGATIVE ng/mL
Creatinine, Urine: 78.7 mg/dL (ref 20.0–300.0)
Ethanol U, Quan: NEGATIVE %
Methadone Screen, Urine: NEGATIVE ng/mL
Nitrite Urine, Quantitative: NEGATIVE ug/mL
OPIATE SCREEN URINE: NEGATIVE ng/mL
Phencyclidine, Ur: NEGATIVE ng/mL
Propoxyphene, Urine: NEGATIVE ng/mL
pH, Urine: 5 (ref 4.5–8.9)

## 2023-11-18 ENCOUNTER — Other Ambulatory Visit: Payer: Self-pay | Admitting: Primary Care

## 2023-11-18 DIAGNOSIS — R928 Other abnormal and inconclusive findings on diagnostic imaging of breast: Secondary | ICD-10-CM

## 2023-11-19 ENCOUNTER — Other Ambulatory Visit: Payer: Self-pay

## 2023-11-19 ENCOUNTER — Ambulatory Visit: Payer: Self-pay | Admitting: Psychiatry

## 2023-11-19 DIAGNOSIS — F418 Other specified anxiety disorders: Secondary | ICD-10-CM

## 2023-11-19 MED ORDER — CLONAZEPAM 0.5 MG PO TABS
0.5000 mg | ORAL_TABLET | Freq: Every day | ORAL | 0 refills | Status: AC | PRN
  Filled 2023-11-19: qty 15, 30d supply, fill #0

## 2023-11-19 NOTE — Telephone Encounter (Signed)
 Reviewed urine drug screen dated 11/15/2023-negative as expected. Will go ahead and send the clonazepam  prescription to [facility].  Patient called and is aware.

## 2023-11-22 ENCOUNTER — Ambulatory Visit
Admission: RE | Admit: 2023-11-22 | Discharge: 2023-11-22 | Disposition: A | Discharge status: Home / Self Care | Source: Ambulatory Visit | Attending: Primary Care

## 2023-11-22 ENCOUNTER — Ambulatory Visit: Payer: Self-pay | Admitting: Primary Care

## 2023-11-22 ENCOUNTER — Ambulatory Visit
Admission: RE | Admit: 2023-11-22 | Discharge: 2023-11-22 | Disposition: A | Discharge status: Home / Self Care | Source: Ambulatory Visit | Attending: Primary Care | Admitting: Primary Care

## 2023-11-22 DIAGNOSIS — R928 Other abnormal and inconclusive findings on diagnostic imaging of breast: Secondary | ICD-10-CM

## 2023-11-28 ENCOUNTER — Encounter

## 2023-11-28 ENCOUNTER — Other Ambulatory Visit

## 2023-12-03 DIAGNOSIS — F4323 Adjustment disorder with mixed anxiety and depressed mood: Secondary | ICD-10-CM | POA: Diagnosis not present

## 2023-12-27 ENCOUNTER — Telehealth: Admitting: Psychiatry

## 2023-12-30 ENCOUNTER — Ambulatory Visit: Payer: Commercial Managed Care - PPO | Admitting: Neurology

## 2024-01-02 ENCOUNTER — Encounter: Payer: Self-pay | Admitting: Psychiatry

## 2024-01-02 ENCOUNTER — Telehealth: Admitting: Psychiatry

## 2024-01-02 DIAGNOSIS — F418 Other specified anxiety disorders: Secondary | ICD-10-CM | POA: Diagnosis not present

## 2024-01-02 DIAGNOSIS — F3342 Major depressive disorder, recurrent, in full remission: Secondary | ICD-10-CM | POA: Diagnosis not present

## 2024-01-02 DIAGNOSIS — F439 Reaction to severe stress, unspecified: Secondary | ICD-10-CM | POA: Diagnosis not present

## 2024-01-02 DIAGNOSIS — F5081 Binge eating disorder, mild: Secondary | ICD-10-CM

## 2024-01-02 NOTE — Progress Notes (Signed)
 Virtual Visit via Video Note  I connected with Autumn [name] on 01/02/24 at  3:30 PM EDT by a video enabled telemedicine application and verified that I am speaking with the correct person using two identifiers.  Location Provider Location : [facility] Patient Location : Work  Participants: Patient , Provider   I discussed the limitations of evaluation and management by telemedicine and the availability of in person appointments. The patient expressed understanding and agreed to proceed.    I discussed the assessment and treatment plan with the patient. The patient was provided an opportunity to ask questions and all were answered. The patient agreed with the plan and demonstrated an understanding of the instructions.   The patient was advised to call back or seek an in-person evaluation if the symptoms worsen or if the condition fails to improve as anticipated.                                                                              BH MD OP Progress Note  01/03/2024 7:08 AM Autumn [name]  MRN:  [id number]  Chief Complaint:  Chief Complaint  Patient presents with   Follow-up   Anxiety   Depression   Medication Refill   Discussed the use of AI scribe software for clinical note transcription with the patient, who gave verbal consent to proceed.  History of Present Illness Autumn [name] is a 38 year old Caucasian female, employed, separated, lives in [place], has a history of MDD, other specified anxiety disorder, prediabetes, history of tachycardia, history of bilateral pulmonary embolism on anticoagulation was evaluated by telemedicine today.  She has been experiencing symptoms of depression, which have improved since starting therapy. She continues to see her therapist regularly and has attended two sessions so far, with another appointment scheduled. She is currently taking Lexapro  20 mg daily and Topamax  25 mg twice a day, though she sometimes takes both  doses of Topamax  in the morning due to difficulty remembering the evening dose.  Her sleep is generally okay, but she continues to experience disturbing dreams that have persisted for about six months, occurring in waves with some weeks being better than others. She describes a recent breakup that initially caused significant anxiety due to anger and living situation issues. However, things have settled down, and she is now communicating civilly. She is currently living together as roommates in a three-bedroom house, which includes her son. She has discussed expectations and is taking things day by day.  She works Monday through Friday and is able to focus at work.  Denies thoughts of self-harm or harm to others. No changes in her medical history, allergies, or medical problems.    Visit Diagnosis:    ICD-10-CM   1. Recurrent major depressive disorder, in full remission (HCC)  F33.42     2. Other specified anxiety disorders  F41.8    With limited symptom attacks    3. Mild binge-eating disorder  F50.810     4. Trauma and stressor-related disorder  F43.9    Unspecified rule out PTSD      Past Psychiatric History: I have reviewed past psychiatric history from progress note on 02/10/2020.  Past trials  of Lexapro , Wellbutrin -noncompliant.  Past Medical History:  Past Medical History:  Diagnosis Date   Anxiety    Back pain    chronic   Bilateral pulmonary embolism (HCC)    a. 06/2020 following COVID infection.   COVID-19 virus infection 06/2020   Depression    Disc disease, degenerative, lumbar or lumbosacral    Herpes zoster without complication 06/20/2018   History of echocardiogram    a. 06/2020 Echo: EF 55-60%, no rwma, nl RV size/fxn.   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 2/022 Zio: RSR, 85 (47-112). Rare PACs/PVCs. No significant arrhythmias/prolonged pauses.   Paresthesia 06/30/2019   Prediabetes    Scoliosis    Shingles 06/20/2018   Stroke (HCC) 2022   Remote lacunar strokes noted in the cerebellum by MRI    Past Surgical History:  Procedure Laterality Date   APPENDECTOMY     BREAST SURGERY Right 2008   lump/ benign   TEE WITHOUT CARDIOVERSION N/A 12/13/2020   Procedure: TRANSESOPHAGEAL ECHOCARDIOGRAM (TEE);  Surgeon: Mady Bruckner, MD;  Location: [facility]S;  Service: Cardiovascular;  Laterality: N/A;    Family Psychiatric History: I have reviewed family psychiatric history from progress note on 02/10/2020.  Family History:  Family History  Problem Relation Age of Onset   Breast cancer Mother 58       and 47   Atrial fibrillation Mother    Alcohol abuse Father    Vision loss Maternal Grandmother    Heart failure Maternal Grandmother    Cancer Maternal Grandfather    Ankylosing spondylitis Maternal Grandfather    Alcohol abuse Paternal Grandfather    Arthritis/Rheumatoid Maternal Aunt    Anxiety disorder Maternal Aunt    Depression Maternal Aunt    Ankylosing spondylitis Maternal Aunt    Atrial fibrillation Maternal Aunt     Social History: I have reviewed social history from progress note on 02/10/2020. Social History   Socioeconomic History   Marital status: Divorced    Spouse name: Not on file   Number of children: 1   Years of education: Not on file   Highest education level: Associate degree: academic program  Occupational History   Occupation: Futures trader  Tobacco Use   Smoking status: Former    Current packs/day: 0.00    Types: Cigarettes    Start date: 08/19/2006    Quit date: 08/18/2016    Years since quitting: 7.3   Smokeless tobacco:  Never   Tobacco comments:    3 cigarettes a day  Vaping Use   Vaping status: Never Used  Substance and Sexual Activity   Alcohol use: Yes    Alcohol/week: 0.0 standard drinks of alcohol    Comment: occasional   Drug use: No   Sexual activity: Yes    Birth control/protection: Pill  Other Topics Concern   Not on file  Social History Narrative   Right handed   Social Drivers of Health   Financial Resource Strain: Not on file  Food Insecurity: Not on file  Transportation Needs: Not on file  Physical Activity: Not on file  Stress: Not on file  Social Connections: Not on file    Allergies:  Allergies  Allergen Reactions   Imitrex [Sumatriptan] Other (See Comments)    Vomiting, Slurred Speech and Facial Numbness   Buspirone  Other (See Comments)   Gadolinium Derivatives Other (See Comments)    Lip tingling, scratchy throat   Iodinated Contrast Media Itching and Cough    Patient experienced sneezing, throat itching, and feeling like needing to cough Given 25 mg benadryl and observed x 20 mins/MMS Per dr Leonce patient should have 13 hour prep   Other Itching    States reaction to contrast dye used for CTA of her brain - itching of tongue, tingling lower lip, coughing    Metabolic Disorder Labs: Lab Results  Component Value Date   HGBA1C 5.9 (A) 02/14/2023   No results found for: PROLACTIN Lab Results  Component Value Date   CHOL 154 02/14/2023   TRIG 79.0 02/14/2023   HDL 51.10 02/14/2023   CHOLHDL 3 02/14/2023   VLDL 15.8 02/14/2023   LDLCALC 87 02/14/2023   LDLCALC 77 01/19/2022   Lab Results  Component Value Date   TSH 2.03 12/30/2020   TSH 1.029 07/08/2020    Therapeutic Level Labs: No results found for: LITHIUM No results found for: VALPROATE No results found for: CBMZ  Current Medications: Current Outpatient Medications  Medication Sig Dispense Refill   acetaminophen  (TYLENOL ) 500 MG tablet Take 1,000 mg by mouth every 6 (six) hours as  needed (headaches/migraines/pain).     Cholecalciferol (VITAMIN D3) 50 MCG (2000 UT) TABS Take by mouth. (Patient not taking: Reported on 10/28/2023)     clonazePAM  (KLONOPIN ) 0.5 MG tablet Take 1 tablet (0.5 mg total) by mouth daily as needed for anxiety. For severe panic attacks, 15 tablets must last 30 days. 15 tablet 0   escitalopram  (LEXAPRO ) 20 MG tablet Take 1 tablet (20 mg total) by mouth daily. 90 tablet 1   Ivermectin 1 % CREA Apply topically.     nitrofurantoin , macrocrystal-monohydrate, (MACROBID ) 100 MG capsule Take 1 capsule (100 mg total) by mouth 2 (two) times daily. 10 capsule 0   topiramate  (TOPAMAX ) 25 MG tablet Take 1 tablet (25 mg total) by mouth 2 (two) times daily. 60 tablet 1   valACYclovir  (VALTREX ) 1000 MG tablet Take 2 tablets (2,000 mg total) by mouth 2 (two) times daily for one day as needed for outbreaks. 12 tablet 0   No current facility-administered medications for this visit.     Musculoskeletal: Strength & Muscle Tone: UTA Gait & Station: Seated Patient leans: N/A  Psychiatric Specialty Exam: Review of Systems  Psychiatric/Behavioral:  Positive for sleep disturbance. The patient is nervous/anxious.     There were no vitals taken for this visit.There is no height or weight on file to calculate BMI.  General Appearance: Casual  Eye Contact:  Fair  Speech:  Clear and Coherent  Volume:  Normal  Mood:  Anxious  Affect:  Congruent  Thought Process:  Goal Directed and Descriptions of Associations: Intact  Orientation:  Full (Time, Place, and Person)  Thought Content: Logical   Suicidal Thoughts:  No  Homicidal Thoughts:  No  Memory:  Immediate;   Fair Recent;   Fair Remote;   Fair  Judgement:  Fair  Insight:  Fair  Psychomotor Activity:  Normal  Concentration:  Concentration: Fair and Attention Span: Fair  Recall:  [employer] of Knowledge: Fair  Language: Fair  Akathisia:  No  Handed:  Right  AIMS (if indicated): not done  Assets:   Communication Skills Desire for Improvement Housing Social Support Transportation  ADL's:  Intact  Cognition: WNL  Sleep:  Varies, vivid dreams at times   Screenings: AIMS    Flowsheet Row Video Visit from 11/20/2021 in [facility]  AIMS Total Score 0   GAD-7    Flowsheet Row Office Visit from 10/21/2023 in [facility] Office Visit from 02/14/2023 in [facility] Office Visit from 07/20/2022 in [facility] Video Visit from 11[facility]20/2021 in [facility] Video Visit from 06/20/2021 in [facility]  Total GAD-7 Score 2 2 3 2 2    PHQ2-9    Flowsheet Row Office Visit from 10/21/2023 in [facility] Video Visit from 02/25/2023 in [facility] Office Visit from 02/14/2023 in [facility] Office Visit from 07/20/2022 in [facility] Visit from 01/18/2022 in [facility]  PHQ-2 Total Score 4 0 2 2 0  PHQ-9 Total Score 13 -- 5 5 7    Flowsheet Row Video Visit from 01/02/2024 in [facility] Video Visit from 11/15/2023 in [facility] Office Visit from 10/21/2023 in [facility]  C-SSRS RISK CATEGORY No Risk No Risk Low Risk     Assessment and Plan: Autumn [name] is a 38 year old Caucasian female, employed, lives in [place], has a history of depression, anxiety was evaluated by telemedicine today.  Discussed assessment and plan as noted below.  MDD in remission Currently reports depression symptoms as improving although does have sleep problems occasionally with vivid  dreams. Continue Lexapro  20 mg daily Continue psychotherapy sessions with [facility].  Binge eating disorder-improving Currently working on mindful eating habits and tolerating Topamax  well. Continue Topamax  50 mg daily. Encouraged to continue CBT  Other specified anxiety disorder-improving Currently with situational stressors although coping better with it. Continue Lexapro  20 mg daily Continue clonazepam  0.5 mg daily as needed-pending urine drug screen reviewed.  Trauma and stress related disorder unspecified-rule out PTSD-improving Although improving does report episodes of vivid dreams which does affect sleep.  Currently takes Topamax  in the morning.  Discussed that Topamax  might also help with nightmares and to try taking it at bedtime. Continue Lexapro  20 mg daily Change Topamax  50 mg daily at bedtime for nightmares. Continue psychotherapy sessions.  Pending urine drug screen.  Follow-up Follow-up in clinic in 2 months or sooner if needed.    Collaboration of Care: Collaboration of Care: Referral or follow-up with counselor/therapist [facility] encouraged to continue psychotherapy sessions.  Patient/Guardian was advised Release of Information must be obtained prior to any record release in order to collaborate their care with an outside provider. Patient/Guardian was advised if they have not already done so to contact the registration department to sign all necessary forms in order for us  to release information regarding their care.   Consent: Patient/Guardian gives verbal consent for treatment and assignment of benefits for services provided during this visit. Patient/Guardian expressed understanding and agreed to proceed.  This note was generated in part or whole with voice recognition software. Voice recognition is usually quite accurate but there are transcription errors that can and very often do occur. I apologize for any typographical  errors that were not detected and  corrected.     Yekaterina Escutia, MD 01/03/2024, 7:08 AM

## 2024-01-03 DIAGNOSIS — F4323 Adjustment disorder with mixed anxiety and depressed mood: Secondary | ICD-10-CM | POA: Diagnosis not present

## 2024-02-28 ENCOUNTER — Other Ambulatory Visit: Payer: Self-pay | Admitting: Psychiatry

## 2024-02-28 ENCOUNTER — Other Ambulatory Visit: Payer: Self-pay

## 2024-02-28 DIAGNOSIS — F5081 Binge eating disorder, mild: Secondary | ICD-10-CM

## 2024-03-01 ENCOUNTER — Other Ambulatory Visit: Payer: Self-pay

## 2024-03-01 MED FILL — Topiramate Tab 25 MG: ORAL | 30 days supply | Qty: 60 | Fill #0 | Status: CN

## 2024-03-02 ENCOUNTER — Other Ambulatory Visit: Payer: Self-pay

## 2024-03-05 ENCOUNTER — Encounter: Payer: Self-pay | Admitting: Psychiatry

## 2024-03-05 ENCOUNTER — Telehealth: Admitting: Psychiatry

## 2024-03-05 ENCOUNTER — Other Ambulatory Visit: Payer: Self-pay

## 2024-03-05 DIAGNOSIS — F439 Reaction to severe stress, unspecified: Secondary | ICD-10-CM

## 2024-03-05 DIAGNOSIS — F418 Other specified anxiety disorders: Secondary | ICD-10-CM

## 2024-03-05 DIAGNOSIS — F5081 Binge eating disorder, mild: Secondary | ICD-10-CM

## 2024-03-05 DIAGNOSIS — F3341 Major depressive disorder, recurrent, in partial remission: Secondary | ICD-10-CM

## 2024-03-05 NOTE — Progress Notes (Signed)
 Virtual Visit via Video Note  I connected with Autumn [name] on 03/05/24 at  1:00 PM EDT by a video enabled telemedicine application and verified that I am speaking with the correct person using two identifiers.  Location Provider Location : [facility] Patient Location : Work  Participants: Patient , Provider   I discussed the limitations of evaluation and management by telemedicine and the availability of in person appointments. The patient expressed understanding and agreed to proceed.   I discussed the assessment and treatment plan with the patient. The patient was provided an opportunity to ask questions and all were answered. The patient agreed with the plan and demonstrated an understanding of the instructions.   The patient was advised to call back or seek an in-person evaluation if the symptoms worsen or if the condition fails to improve as anticipated.  BH MD OP Progress Note  03/05/2024 2:13 PM ABIAGEAL BLOWE  MRN:  [id number]  Chief Complaint:  Chief Complaint  Patient presents with   Follow-up   Anxiety   Depression   Medication Refill   Discussed the use of AI scribe software for clinical note transcription with the patient, who gave verbal consent to proceed.  History of Present Illness Autumn [name] is a 38 year old Caucasian female, employed, separated, lives in [place], has a history of MDD, other specified anxiety disorder, prediabetes, history of tachycardia, history of bilateral pulmonary embolism on anticoagulation was evaluated by telemedicine today.  She reports overall doing well, noting both good days and bad days. She manages her mood and anxiety adequately, and she denies any current concerns in these areas. She continues therapy sessions at [facility] every couple of weeks, and she states that therapy is going well. In therapy, she works on trauma symptoms and relationship struggles, describing difficulty prioritizing her own  needs, feeling easily persuaded by others, and experiencing exhaustion from these interpersonal challenges. She identifies grief and emotional triggers related to relationship changes and actively engages in therapy to address these issues.  She experiences vivid and disturbing dreams at least 2 times per week, which prompted her to add Topamax . She reports that these dreams do not interrupt her sleep, as she falls asleep easily. She remains uncertain about whether Topamax  helps with nightmares or binge eating, and she is considering discontinuing it. She denies any recent changes in appetite or episodes of binge eating and feels her eating remains stable.  She continues Lexapro  20 mg daily for depression and anxiety. She also has clonazepam  prescribed as needed but reports using it very rarely, stating she does not recall the last time she took a dose. She denies thoughts of hurting herself or others.  She is currently employed in neurosurgery. She recently moved to a new rental home with her son and a friend. Her son is in tenth grade.   Visit Diagnosis:    ICD-10-CM   1. Recurrent major depressive disorder, in partial remission  F33.41     2. Other specified anxiety disorders  F41.8    With limited symptom attacks    3. Mild binge-eating disorder  F50.810     4. Trauma and stressor-related disorder  F43.9    Unspecified, rule out PTSD      Past Psychiatric History: I have reviewed past psychiatric history from progress note on 02/10/2020.  Past trials of Lexapro , Wellbutrin -noncompliant.  Reviewed past psychiatric history from progress note on 02/10/2020.  Past Medical History:  Past Medical History:  Diagnosis Date  Anxiety    Back pain    chronic   Bilateral pulmonary embolism (HCC)    a. 06/2020 following COVID infection.   COVID-19 virus infection 06/2020   Depression    Disc disease, degenerative, lumbar or lumbosacral    Herpes zoster without complication 06/20/2018    History of echocardiogram    a. 06/2020 Echo: EF 55-60%, no rwma, nl RV size/fxn.   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 2/022 Zio: RSR, 85 (47-112). Rare PACs/PVCs. No significant arrhythmias/prolonged pauses.   Paresthesia 06/30/2019   Prediabetes    Scoliosis    Shingles 06/20/2018   Stroke (HCC) 2022   Remote lacunar strokes noted in the cerebellum by MRI    Past Surgical History:  Procedure Laterality Date   APPENDECTOMY     BREAST SURGERY Right 2008   lump/ benign   TEE WITHOUT CARDIOVERSION N/A 12/13/2020   Procedure: TRANSESOPHAGEAL ECHOCARDIOGRAM (TEE);  Surgeon: Mady Bruckner, MD;  Location: [facility] ORS;  Service: Cardiovascular;  Laterality: N/A;    Family Psychiatric History: I have reviewed social history from progress note on 02/10/2020.  Family History:  Family History  Problem Relation Age of Onset   Breast cancer Mother 76       and 17   Atrial fibrillation Mother    Alcohol abuse Father    Vision loss Maternal Grandmother    Heart failure Maternal Grandmother    Cancer Maternal Grandfather    Ankylosing spondylitis Maternal Grandfather    Alcohol abuse Paternal Grandfather    Arthritis/Rheumatoid Maternal Aunt    Anxiety disorder Maternal Aunt    Depression Maternal Aunt    Ankylosing spondylitis Maternal Aunt    Atrial fibrillation Maternal Aunt     Social History: I have reviewed social history from progress note on 02/10/2020. Social History   Socioeconomic History   Marital status: Divorced    Spouse name: Not on file   Number of children: 1   Years of education:  Not on file   Highest education level: Associate degree: academic program  Occupational History   Occupation: Futures trader  Tobacco Use   Smoking status: Former    Current packs/day: 0.00    Types: Cigarettes    Start date: 08/19/2006    Quit date: 08/18/2016    Years since quitting: 7.5   Smokeless tobacco: Never   Tobacco comments:    3 cigarettes a day  Vaping Use   Vaping status: Never Used  Substance and Sexual Activity   Alcohol use: Yes    Alcohol/week: 0.0 standard drinks of alcohol    Comment: occasional   Drug use: No   Sexual activity: Yes    Birth control/protection: Pill  Other Topics Concern   Not on file  Social History Narrative   Right handed   Social Drivers of Health   Financial Resource Strain: Not on file  Food Insecurity: Not on file  Transportation Needs: Not on file  Physical Activity: Not on file  Stress: Not on file  Social Connections: Not on file    Allergies:  Allergies  Allergen Reactions   Imitrex [Sumatriptan] Other (See Comments)    Vomiting, Slurred Speech and Facial Numbness   Buspirone  Other (See Comments)   Gadolinium Derivatives Other (See Comments)    Lip tingling, scratchy throat   Iodinated Contrast Media Itching and Cough    Patient experienced sneezing, throat itching, and feeling like needing to cough Given 25 mg benadryl and observed x 20 mins/MMS Per dr Leonce patient should have 13 hour prep   Other Itching    States reaction to contrast dye used for CTA of her brain - itching of tongue, tingling lower lip, coughing    Metabolic Disorder Labs: Lab Results  Component Value Date   HGBA1C 5.9 (A) 02/14/2023   No results found for: PROLACTIN Lab Results  Component Value Date   CHOL 154 02/14/2023   TRIG 79.0 02/14/2023   HDL 51.10 02/14/2023   CHOLHDL 3 02/14/2023   VLDL 15.8 02/14/2023   LDLCALC 87 02/14/2023   LDLCALC 77 01/19/2022   Lab Results  Component Value Date   TSH 2.03 12/30/2020   TSH 1.029  07/08/2020    Therapeutic Level Labs: No results found for: LITHIUM No results found for: VALPROATE No results found for: CBMZ  Current Medications: Current Outpatient Medications  Medication Sig Dispense Refill   acetaminophen  (TYLENOL ) 500 MG tablet Take 1,000 mg by mouth every 6 (six) hours as needed (headaches/migraines/pain).     Cholecalciferol (VITAMIN D3) 50 MCG (2000 UT) TABS Take by mouth. (Patient not taking: Reported on 10/28/2023)     clonazePAM  (KLONOPIN ) 0.5 MG tablet Take 1 tablet (0.5 mg total) by mouth daily as needed for anxiety. For severe panic attacks, 15 tablets must last 30 days. 15 tablet 0   escitalopram  (LEXAPRO ) 20 MG tablet Take 1 tablet (20 mg total) by mouth daily. 90 tablet 1   Ivermectin 1 % CREA Apply topically.     nitrofurantoin , macrocrystal-monohydrate, (MACROBID ) 100 MG capsule Take 1 capsule (100 mg total) by mouth 2 (two) times daily. 10 capsule 0   valACYclovir  (VALTREX ) 1000 MG tablet Take 2 tablets (2,000 mg total) by mouth 2 (two) times daily for one day as needed for outbreaks. 12 tablet 0   No current facility-administered medications for this visit.     Musculoskeletal: Strength & Muscle Tone: UTA Gait & Station: Seated Patient leans: N/A  Psychiatric Specialty Exam: Review of Systems  Psychiatric/Behavioral:  The patient is nervous/anxious.     There were no vitals taken for this visit.There is no height or weight on file to calculate BMI.  General Appearance: Casual  Eye Contact:  Fair  Speech:  Clear and Coherent  Volume:  Normal  Mood:  Anxious  Affect:  Congruent  Thought Process:  Goal Directed and Descriptions of Associations: Intact  Orientation:  Full (Time, Place, and Person)  Thought Content: Logical   Suicidal Thoughts:  No  Homicidal Thoughts:  No  Memory:  Immediate;   Fair Recent;   Fair Remote;   Fair  Judgement:  Fair  Insight:  Fair  Psychomotor Activity:  Normal  Concentration:  Concentration:  Fair and Attention Span: Fair  Recall:  [employer] of Knowledge: Fair  Language: Fair  Akathisia:  No  Handed:  Right  AIMS (if indicated): not done  Assets:  Communication Skills Desire for Improvement Housing Social Support Transportation  ADL's:  Intact  Cognition: WNL  Sleep:  varies , vivid dreams   Screenings: AIMS    Haematologist  Visit from 11/20/2021 in [facility]  AIMS Total Score 0   GAD-7    Flowsheet Row Office Visit from 10/21/2023 in [facility] Office Visit from 02/14/2023 in [facility] Office Visit from 07/20/2022 in [facility] Video Visit from 11/20/2021 in [facility] Video Visit from 06/20/2021 in [facility]  Total GAD-7 Score 2 2 3 2 2    PHQ2-9    Flowsheet Row Office Visit from 10/21/2023 in [facility] Video Visit from 02/25/2023 in [facility] Office Visit from 02/14/2023 in [facility] Office Visit from 07/20/2022 in [facility] Office Visit from 01/18/2022 in [facility]  PHQ-2 Total Score 4 0 2 2 0  PHQ-9 Total Score 13 -- 5 5 7    Flowsheet Row Video Visit from 03/05/2024 in [facility] Video Visit from 01/02/2024 in [facility] Video Visit from 11/15/2023 in [facility]  C-SSRS RISK CATEGORY No Risk No Risk No Risk     Assessment and Plan: SHELLBY SCHLINK is a 38 year old Caucasian female, employed, lives in [place], was evaluated by telemedicine today.  Discussed assessment and plan as noted below.  1. Recurrent  major depressive disorder, in partial remission Reports good improvement with depression symptoms although continues to grieve the loss of her relationship which does affect her mood.  Interested in continuing psychotherapy session with therapist. Continue Lexapro  20 mg daily Continue psychotherapy sessions with [facility].  2. Other specified anxiety disorders, with limited symptom attacks-improving Does have situational anxiety although currently managing well. Continue Lexapro  as prescribed Continue Clonazepam  0.5 mg daily as needed. Reviewed [place] PMP AWARxE  3. Mild binge-eating disorder-improving Currently working on eating habits, denies any significant concerns.  Would like to come off of Topamax  to assess benefits. Continue CBT Could discontinue Topamax  50 mg daily.  4. Trauma and stressor-related disorder-rule out PTSD-improving Does have vivid dreams.  Does not believe Topamax  is beneficial.  Interested in discontinuing this medication. Discontinue Topamax  50 mg daily based on patient preference. Continue CBT.  Follow-up Follow-up in clinic in 3 months or sooner if needed.  Collaboration of Care: Collaboration of Care: Referral or follow-up with counselor/therapist [facility] encouraged to continue psychotherapy sessions.  Patient/Guardian was advised Release of Information must be obtained prior to any record release in order to collaborate their care with an outside provider. Patient/Guardian was advised if they have not already done so to contact the registration department to sign all necessary forms in order for us  to release information regarding their care.   Consent: Patient/Guardian gives verbal consent for treatment and assignment of benefits for services provided during this visit. Patient/Guardian expressed understanding and agreed to proceed.  This note was generated in part or whole with voice recognition software. Voice recognition is usually quite accurate but  there are transcription errors that can and very often do occur. I apologize for any typographical errors that were not detected and corrected.     Shereen Marton, MD 03/05/2024, 2:13 PM

## 2024-04-16 ENCOUNTER — Other Ambulatory Visit: Payer: Self-pay | Admitting: Primary Care

## 2024-04-16 ENCOUNTER — Other Ambulatory Visit: Payer: Self-pay

## 2024-04-16 DIAGNOSIS — B009 Herpesviral infection, unspecified: Secondary | ICD-10-CM

## 2024-04-16 MED ORDER — VALACYCLOVIR HCL 1 G PO TABS
2000.0000 mg | ORAL_TABLET | Freq: Two times a day (BID) | ORAL | 0 refills | Status: AC
  Filled 2024-04-16 – 2024-05-27 (×2): qty 12, 3d supply, fill #0

## 2024-04-16 NOTE — Telephone Encounter (Signed)
 Patient is due for CPE/follow up, this will be required prior to any further refills.  Please schedule, thank you!

## 2024-04-28 ENCOUNTER — Other Ambulatory Visit: Payer: Self-pay

## 2024-05-01 ENCOUNTER — Ambulatory Visit

## 2024-05-01 DIAGNOSIS — G8929 Other chronic pain: Secondary | ICD-10-CM

## 2024-05-01 DIAGNOSIS — M549 Dorsalgia, unspecified: Secondary | ICD-10-CM | POA: Diagnosis not present

## 2024-05-01 DIAGNOSIS — M5442 Lumbago with sciatica, left side: Secondary | ICD-10-CM

## 2024-05-01 DIAGNOSIS — M4185 Other forms of scoliosis, thoracolumbar region: Secondary | ICD-10-CM | POA: Diagnosis not present

## 2024-05-01 DIAGNOSIS — M47814 Spondylosis without myelopathy or radiculopathy, thoracic region: Secondary | ICD-10-CM | POA: Diagnosis not present

## 2024-05-01 DIAGNOSIS — M47816 Spondylosis without myelopathy or radiculopathy, lumbar region: Secondary | ICD-10-CM | POA: Diagnosis not present

## 2024-05-01 DIAGNOSIS — M5441 Lumbago with sciatica, right side: Secondary | ICD-10-CM

## 2024-05-01 DIAGNOSIS — M5134 Other intervertebral disc degeneration, thoracic region: Secondary | ICD-10-CM | POA: Diagnosis not present

## 2024-05-01 DIAGNOSIS — M545 Low back pain, unspecified: Secondary | ICD-10-CM

## 2024-05-01 NOTE — Patient Instructions (Signed)

## 2024-05-01 NOTE — Progress Notes (Signed)
 Chief Complaint: Middle and lower back pain    History of Present Illness:    Autumn [name] is a 38 y.o. female reports 5 to 90-month history of mid and lower back pain.  Describes as pressure/tightness sensation.  Exacerbated with walking or standing for an extended period of time.  Notices pain with ADLs such as getting dressed and hygiene.  Has been taking over-the-counter Tylenol  and ibuprofen  and previously prescribed Flexeril  and Metaxalone  with minimal symptom improvement.  Patient with previous history of seeing a chiropractor (approximately 10 years ago) who took xrays, performed manipulation and utilized a TENS unit.  Patient denies any precipitating injury/trauma.  Patient does state that 5 months ago she moved from urology to neurosurgery department for position; works as a CLINICAL BIOCHEMIST for [employer].  States it is a more sedentary position.  Does report 17 pound weight gain in past 5 months.  Patient states pain is primarily in the back.  Denies radiating pain to lower extremities.  Denies lower extremity weakness.  Patient does report bilateral tingling/numbness on plantar surface of feet bilaterally.  Patient reports x-rays taken by chiropractor did reveal degenerative disc disease.  Patient states she has previously been seen by Dr. Arthea Sharps, sports medicine provider in [place], for neck pain.  States cervical spine x-ray revealed degenerative disc disease and spondylosis.  Patient as a child was diagnosed with scoliosis.  Patient was born and raised in [place].  Immigrated to the [place]  at age 22.  Patient states that her scoliosis was treated with physical therapy.  Patient with no diagnosis of DM 2.  Not on any diabetes medication.  Last A1c 5.9% on 02/14/2023, 1 year ago.  Patient with a BUN of 15, creatinine 0.71, GFR 99.      PMH/PSH/Social History/Family History/Allergies/Meds:   Past Medical History:  Diagnosis Date   Anxiety    Back pain    chronic   Bilateral  pulmonary embolism (HCC)    a. 06/2020 following COVID infection.   COVID-19 virus infection 06/2020   Depression    Disc disease, degenerative, lumbar or lumbosacral    Herpes zoster without complication 06/20/2018   History of echocardiogram    a. 06/2020 Echo: EF 55-60%, no rwma, nl RV size/fxn.   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 2/022 Zio: RSR, 85 (47-112). Rare PACs/PVCs. No significant arrhythmias/prolonged pauses.   Paresthesia 06/30/2019   Prediabetes    Scoliosis    Shingles 06/20/2018   Stroke (HCC) 2022   Remote lacunar strokes noted in the cerebellum by MRI   Past Surgical History:  Procedure Laterality Date   APPENDECTOMY     BREAST SURGERY Right 2008   lump/ benign   TEE WITHOUT CARDIOVERSION N/A 12/13/2020   Procedure: TRANSESOPHAGEAL ECHOCARDIOGRAM (TEE);  Surgeon: Mady Bruckner, MD;  Location: [facility] ORS;  Service: Cardiovascular;  Laterality: N/A;   Social History   Socioeconomic History   Marital status: Divorced    Spouse name: Not on file   Number of children: 1   Years of education: Not on file  Highest education level: Associate degree: academic program  Occupational History   Occupation: futures trader  Tobacco Use   Smoking status: Former    Current packs/day: 0.00    Types: Cigarettes    Start date: 08/19/2006    Quit date: 08/18/2016    Years since quitting: 7.7   Smokeless tobacco: Never   Tobacco comments:    3 cigarettes a day  Vaping Use   Vaping status: Never Used  Substance and Sexual Activity   Alcohol use: Yes    Alcohol/week: 0.0 standard drinks  of alcohol    Comment: occasional   Drug use: No   Sexual activity: Yes    Birth control/protection: Pill  Other Topics Concern   Not on file  Social History Narrative   Right handed   Social Drivers of Health   Financial Resource Strain: Not on file  Food Insecurity: Not on file  Transportation Needs: Not on file  Physical Activity: Not on file  Stress: Not on file  Social Connections: Not on file   Family History  Problem Relation Age of Onset   Breast cancer Mother 32       and 39   Atrial fibrillation Mother    Alcohol abuse Father    Vision loss Maternal Grandmother    Heart failure Maternal Grandmother    Cancer Maternal Grandfather    Ankylosing spondylitis Maternal Grandfather    Alcohol abuse Paternal Grandfather    Arthritis/Rheumatoid Maternal Aunt    Anxiety disorder Maternal Aunt    Depression Maternal Aunt    Ankylosing spondylitis Maternal Aunt    Atrial fibrillation Maternal Aunt    Allergies  Allergen Reactions   Imitrex [Sumatriptan] Other (See Comments)    Vomiting, Slurred Speech and Facial Numbness   Buspirone  Other (See Comments)   Gadolinium Derivatives Other (See Comments)    Lip tingling, scratchy throat   Iodinated Contrast Media Itching and Cough    Patient experienced sneezing, throat itching, and feeling like needing to cough Given 25 mg benadryl and observed x 20 mins/MMS Per dr Leonce patient should have 13 hour prep   Other Itching    States reaction to contrast dye used for CTA of her brain - itching of tongue, tingling lower lip, coughing   Current Outpatient Medications  Medication Sig Dispense Refill   acetaminophen  (TYLENOL ) 500 MG tablet Take 1,000 mg by mouth every 6 (six) hours as needed (headaches/migraines/pain).     Cholecalciferol (VITAMIN D3) 50 MCG (2000 UT) TABS Take by mouth. (Patient not taking: Reported on 10/28/2023)     clonazePAM  (KLONOPIN ) 0.5 MG tablet Take 1 tablet (0.5 mg total) by mouth daily as needed for  anxiety. For severe panic attacks, 15 tablets must last 30 days. 15 tablet 0   escitalopram  (LEXAPRO ) 20 MG tablet Take 1 tablet (20 mg total) by mouth daily. 90 tablet 1   Ivermectin 1 % CREA Apply topically.     nitrofurantoin , macrocrystal-monohydrate, (MACROBID ) 100 MG capsule Take 1 capsule (100 mg total) by mouth 2 (two) times daily. 10 capsule 0   valACYclovir  (VALTREX ) 1000 MG tablet Take 2 tablets (2,000 mg total) by mouth 2 (two) times daily for one day as needed for outbreaks. 12 tablet 0   No current facility-administered medications for this visit.   No results found.  I have reviewed past medical, surgical, social and family history, medications, and allergies as documented in the EMR.   Review of Systems:    A ROS  was performed including pertinent positives and negatives as documented in the HPI.   Physical Exam :    General/Constitutional: NAD and appears stated age Vascular: No edema, swelling or tenderness, except as noted in detailed exam Integumentary: No impressive skin lesions present, except as noted in detailed exam Neurological: Alert and oriented Psych: Appropriate affect and cooperative Musculoskeletal: Normal, except as noted in detailed exam and HPI There were no vitals taken for this visit.    Focused Orthopedic Exam:    MSK (spine):  Back normal to inspection; very mild scoliosis visible. Minimal tenderness with palpation over midline spine at T10 to L1. Mild tenderness with palpation over paraspinal lower thoracic and lumbar musculature; palpable muscle tightness. No tenderness with palpation over SI joints bilaterally.  -Strength exam      Left  Right EHL    5/5  5/5 TA    5/5  5/5 GSC    5/5  5/5 Knee extension  5/5  5/5 Hip flexion   5/5  5/5  -Hip exam Negative FADIR, FABER, and log roll test of bilateral hips  -Sensory exam    Sensation intact to light touch in L3-S1 nerve distributions of bilateral lower extremities    -Straight  leg raise: Negative bilaterally (straight leg raise does elicit some hamstring tightness) -Gait: No gait abnormality   Vascular/Lymphatic: 2+ dorsalis pedis/posterior tibialis pulses, foot warm and well perfused     Imaging:    Xray (Thoracic and Lumbar Spine): Xray including 5-views (thoracic AP, lateral, and swimmers view & lumbar AP, lateral) obtained today 05/01/2024 at [facility] were reviewed personally by me. Per my independent interpretation these images show no acute fracture.  Mild dextroscoliosis of lower thoracic spine and levoscoliosis of upper lumbar spine. Cobb angle of approximately 10 degrees. Minimal joint space loss at T12-L1 and L1-L2.     Radiology Read:  Cervical Spine Xray on 04/16/2018 IMPRESSION: Mild cervical spinal curvature.  Mild degenerative spondylosis C5-6.     Assessment and Plan:    Working diagnosis: Axial thoracolumbar back pain  38 y.o. female was seen and examined in office today. We reviewed patient's history, examination, and imaging in detail. Based on information available for this encounter, patient with 5 to 36-month history of mid and low back pain.  Describes as pressure/tightness sensation.  Associated mid back burning.  Minimal improvement with over-the-counter analgesics.  Physical exam reveals palpable paraspinal musculature tightness.  Thoracic and lumbar spine imaging/x-ray performed in office today reveal mild scoliosis and very minimal degenerative disc disease at T12-L1 and L1-L2.  Suspect patient's axial back pain primarily due to muscle strain.  Discussed treatment options, including but not limited to over-the-counter analgesics, prescription anti-inflammatories, prescription muscle relaxers, referral to physical therapy.  Patient at this time agrees to course of physical therapy.  Referral placed.  Patient scheduled follow-up for 6 to 8 weeks for reevaluation.  Advised patient to call and  follow-up sooner if any new/worsening symptoms or concerns.     I personally saw and evaluated the patient, and participated in the management and treatment plan.  Arlyss GEANNIE Schneider DO Orthopedic Surgery & Sports Medicine [facility] with [facility]   This document was dictated using Dragon voice recognition software. A reasonable attempt at proof reading has been made to minimize errors.

## 2024-05-21 ENCOUNTER — Other Ambulatory Visit: Payer: Self-pay

## 2024-05-21 DIAGNOSIS — B350 Tinea barbae and tinea capitis: Secondary | ICD-10-CM | POA: Diagnosis not present

## 2024-05-21 MED ORDER — KETOCONAZOLE 2 % EX CREA
1.0000 | TOPICAL_CREAM | Freq: Two times a day (BID) | CUTANEOUS | 3 refills | Status: AC | PRN
  Filled 2024-05-21: qty 60, 30d supply, fill #0

## 2024-05-25 DIAGNOSIS — M5459 Other low back pain: Secondary | ICD-10-CM | POA: Diagnosis not present

## 2024-05-26 ENCOUNTER — Ambulatory Visit: Admitting: Primary Care

## 2024-05-26 ENCOUNTER — Ambulatory Visit: Payer: Self-pay | Admitting: Primary Care

## 2024-05-26 ENCOUNTER — Encounter: Payer: Self-pay | Admitting: Primary Care

## 2024-05-26 VITALS — BP 112/68 | HR 82 | Temp 98.2°F | Ht 69.5 in | Wt 262.0 lb

## 2024-05-26 DIAGNOSIS — Z6838 Body mass index (BMI) 38.0-38.9, adult: Secondary | ICD-10-CM | POA: Diagnosis not present

## 2024-05-26 DIAGNOSIS — F3341 Major depressive disorder, recurrent, in partial remission: Secondary | ICD-10-CM | POA: Diagnosis not present

## 2024-05-26 DIAGNOSIS — R7303 Prediabetes: Secondary | ICD-10-CM

## 2024-05-26 DIAGNOSIS — E66812 Obesity, class 2: Secondary | ICD-10-CM

## 2024-05-26 DIAGNOSIS — Z0001 Encounter for general adult medical examination with abnormal findings: Secondary | ICD-10-CM

## 2024-05-26 DIAGNOSIS — E6609 Other obesity due to excess calories: Secondary | ICD-10-CM | POA: Diagnosis not present

## 2024-05-26 DIAGNOSIS — G43009 Migraine without aura, not intractable, without status migrainosus: Secondary | ICD-10-CM

## 2024-05-26 LAB — COMPREHENSIVE METABOLIC PANEL WITH GFR
ALT: 21 U/L (ref 0–35)
AST: 15 U/L (ref 5–37)
Albumin: 4.1 g/dL (ref 3.5–5.2)
Alkaline Phosphatase: 56 U/L (ref 39–117)
BUN: 13 mg/dL (ref 6–23)
CO2: 27 meq/L (ref 19–32)
Calcium: 8.6 mg/dL (ref 8.4–10.5)
Chloride: 105 meq/L (ref 96–112)
Creatinine, Ser: 0.78 mg/dL (ref 0.40–1.20)
GFR: 96.49 mL/min (ref >60.00)
Glucose, Bld: 102 mg/dL — ABNORMAL HIGH (ref 70–99)
Potassium: 3.9 meq/L (ref 3.5–5.1)
Sodium: 138 meq/L (ref 135–145)
Total Bilirubin: 0.2 mg/dL (ref 0.2–1.2)
Total Protein: 6.9 g/dL (ref 6.0–8.3)

## 2024-05-26 LAB — LIPID PANEL
Cholesterol: 134 mg/dL (ref 28–200)
HDL: 42.9 mg/dL (ref >39.00)
LDL Cholesterol: 76 mg/dL (ref 0–99)
NonHDL: 90.94
Total CHOL/HDL Ratio: 3
Triglycerides: 74 mg/dL (ref 0.0–149.0)
VLDL: 14.8 mg/dL (ref 0.0–40.0)

## 2024-05-26 LAB — HEMOGLOBIN A1C: Hgb A1c MFr Bld: 6 % (ref 4.6–6.5)

## 2024-05-26 NOTE — Assessment & Plan Note (Signed)
 Overall stable per patient.  Following with psychiatry, office notes reviewed from September 2025.  Continue Lexapro  20 mg daily. Continue clonazepam  0.5 mg as needed.

## 2024-05-26 NOTE — Assessment & Plan Note (Signed)
 Controlled.  Continue to monitor.

## 2024-05-26 NOTE — Patient Instructions (Signed)
 Start semaglutide [facility]) for weight loss. Start by injecting 0.25 mg into the skin once weekly for 4 weeks, then increase to 0.5 mg once weekly thereafter.  Please notify me once you have used your last 0.25 mg pen so that I can send the 0.5 mg dose to your pharmacy.  Please schedule a follow up visit for 3 months.  It was a pleasure to see you today!

## 2024-05-26 NOTE — Assessment & Plan Note (Signed)
 Repeat A1C pending,

## 2024-05-26 NOTE — Assessment & Plan Note (Signed)
 Immunizations UTD. Pap smear UTD.  Discussed the importance of a healthy diet and regular exercise in order for weight loss, and to reduce the risk of further co-morbidity.  Exam stable. Labs pending.  Follow up in 1 year for repeat physical.

## 2024-05-26 NOTE — Progress Notes (Signed)
 Subjective:    Patient ID: Autumn [name], female    DOB: [birth year], 38 y.o.   MRN: [id number]  Autumn [name] is a very pleasant 38 y.o. female who presents today for complete physical and follow up of chronic conditions.  She would like to discuss her obesity. She's been more sedentary due to a job change which limits her ability to exercise. She believes her weight contributes to her depression. She admits to stress eating and eating when depressed. She would like to try a GLP 1 agonist medication to boost weight loss. She endorses a healthy diet. She's unable to exercise also due to her back pain.   Immunizations: -Tetanus: Completed in 2020 -Influenza: Completed this season  -HPV: Completed series   Diet: Fair diet.  Exercise: No regular exercise. Recently started PT.  Eye exam: Completes annually  Dental exam: Completes semi-annually    Pap Smear: Completed in August 2023   Wt Readings from Last 3 Encounters:  05/26/24 262 lb (118.8 kg)  10/21/23 252 lb 3.2 oz (114.4 kg)  08/11/23 241 lb (109.3 kg)       Review of Systems  Constitutional:  Negative for unexpected weight change.  HENT:  Negative for rhinorrhea.   Respiratory:  Negative for cough and shortness of breath.   Cardiovascular:  Negative for chest pain.  Gastrointestinal:  Negative for constipation and diarrhea.  Genitourinary:  Negative for difficulty urinating.  Musculoskeletal:  Positive for back pain. Negative for myalgias.  Skin:  Negative for rash.  Allergic/Immunologic: Negative for environmental allergies.  Neurological:  Negative for dizziness, numbness and headaches.  Psychiatric/Behavioral:  The patient is not nervous/anxious.          Past Medical History:  Diagnosis Date   Allergy    Anxiety    Arthritis    Back pain    chronic   Bilateral pulmonary embolism (HCC)    a. 06/2020 following COVID infection.   COVID-19 virus infection 06/2020   Cystitis 10/28/2023    Depression    Disc disease, degenerative, lumbar or lumbosacral    GERD (gastroesophageal reflux disease)    Herpes zoster without complication 06/20/2018   History of echocardiogram    a. 06/2020 Echo: EF 55-60%, no rwma, nl RV size/fxn.   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 2/022 Zio: RSR, 85 (47-112). Rare PACs/PVCs. No significant arrhythmias/prolonged pauses.   Paresthesia 06/30/2019   Prediabetes    Scoliosis    Shingles 06/20/2018   Stroke [facility])    Remote lacunar strokes noted in the cerebellum by MRI    Social History   Socioeconomic History   Marital status: Divorced    Spouse name: Not on file   Number of children: 1   Years of education: Not on file   Highest education level: Associate degree: academic program  Occupational History   Occupation: futures trader  Tobacco Use   Smoking status: Former    Current packs/day: 0.00    Types: Cigarettes    Start date: 08/19/2006    Quit date: 08/18/2016    Years since  quitting: 7.7   Smokeless tobacco: Never   Tobacco comments:    3 cigarettes a day  Vaping Use   Vaping status: Never Used  Substance and Sexual Activity   Alcohol use: Yes    Alcohol/week: 0.0 standard drinks of alcohol    Comment: occasional   Drug use: No   Sexual activity: Yes    Birth control/protection: Pill  Other Topics Concern   Not on file  Social History Narrative   Right handed   Social Drivers of Health   Tobacco Use: Medium Risk (05/26/2024)   Patient History    Smoking Tobacco Use: Former    Smokeless Tobacco Use: Never    Passive Exposure: Not  on Actuary Strain: Not on file  Food Insecurity: Not on file  Transportation Needs: Not on file  Physical Activity: Not on file  Stress: Not on file  Social Connections: Not on file  Intimate Partner Violence: Not on file  Depression (PHQ2-9): Medium Risk (05/26/2024)   Depression (PHQ2-9)    PHQ-2 Score: 7  Alcohol Screen: Not on file  Housing: Not on file  Utilities: Not on file  Health Literacy: Not on file    Past Surgical History:  Procedure Laterality Date   APPENDECTOMY     BREAST SURGERY Right 2008   lump/ benign   TEE WITHOUT CARDIOVERSION N/A 12/13/2020   Procedure: TRANSESOPHAGEAL ECHOCARDIOGRAM (TEE);  Surgeon: Mady Bruckner, MD;  Location: [facility] ORS;  Service: Cardiovascular;  Laterality: N/A;    Family History  Problem Relation Age of Onset   Breast cancer Mother 40       and 45   Atrial fibrillation Mother    Cancer Mother    Alcohol abuse Father    Vision loss Maternal Grandmother    Heart failure Maternal Grandmother    Heart disease Maternal Grandmother    Cancer Maternal Grandfather    Ankylosing spondylitis Maternal Grandfather    Alcohol abuse Paternal Grandfather    Arthritis/Rheumatoid Maternal Aunt    Anxiety disorder Maternal Aunt    Depression Maternal Aunt    Ankylosing spondylitis Maternal Aunt    Atrial fibrillation Maternal Aunt    Arthritis Maternal Aunt     Allergies[1]  Medications Ordered Prior to Encounter[2]  BP 112/68   Pulse 82   Temp 98.2 F (36.8 C) (Oral)   Ht 5' 9.5 (1.765 m)   Wt 262 lb (118.8 kg)   LMP 05/02/2024   SpO2 96%   BMI 38.14 kg/m  Objective:   Physical Exam HENT:     Right Ear: Tympanic membrane and ear canal normal.     Left Ear: Tympanic membrane and ear canal normal.  Eyes:     Pupils: Pupils are equal, round, and reactive to light.  Cardiovascular:     Rate and Rhythm: Normal rate and regular rhythm.  Pulmonary:     Effort: Pulmonary effort is normal.     Breath sounds:  Normal breath sounds.  Abdominal:     General: Bowel sounds are normal.     Palpations: Abdomen is soft.     Tenderness: There is no abdominal tenderness.  Musculoskeletal:        General: Normal range of motion.     Cervical back: Neck supple.  Skin:    General: Skin is warm and dry.  Neurological:     Mental Status: She is alert and oriented to person, place, and time.     Cranial Nerves: No  cranial nerve deficit.     Deep Tendon Reflexes:     Reflex Scores:      Patellar reflexes are 2+ on the right side and 2+ on the left side. Psychiatric:        Mood and Affect: Mood normal.     Physical Exam        Assessment & Plan:  Encounter for annual general medical examination with abnormal findings in adult Assessment & Plan: Immunizations UTD. Pap smear UTD.  Discussed the importance of a healthy diet and regular exercise in order for weight loss, and to reduce the risk of further co-morbidity.  Exam stable. Labs pending.  Follow up in 1 year for repeat physical.    Migraine without aura and without status migrainosus, not intractable Assessment & Plan: Controlled.  Continue to monitor.    Prediabetes Assessment & Plan: Repeat A1C pending,  Orders: -     Lipid panel -     Comprehensive metabolic panel with GFR -     Hemoglobin A1c  MDD (major depressive disorder), recurrent, in partial remission Assessment & Plan: Overall stable per patient.  Following with psychiatry, office notes reviewed from September 2025.  Continue Lexapro  20 mg daily. Continue clonazepam  0.5 mg as needed.   Class 2 obesity due to excess calories without serious comorbidity with body mass index (BMI) of 38.0 to 38.9 in adult Assessment & Plan: She is certainly a candidate for GLP-1 agonist treatment.  We discussed options for treatment, administration instructions, and potential side effects.  She would like to proceed.  Start semaglutide  (Wegovy ) for weight loss. Start by  injecting 0.25 mg into the skin once weekly for 4 weeks, then increase to 0.5 mg once weekly thereafter.    Follow-up in 3 months if medication is approved by insurance     Assessment and Plan Assessment & Plan         Autumn MARLA Gaskins, NP       [1]  Allergies Allergen Reactions   Imitrex [Sumatriptan] Other (See Comments)    Vomiting, Slurred Speech and Facial Numbness   Buspirone  Other (See Comments)   Gadolinium Derivatives Other (See Comments)    Lip tingling, scratchy throat   Iodinated Contrast Media Itching and Cough    Patient experienced sneezing, throat itching, and feeling like needing to cough Given 25 mg benadryl and observed x 20 mins/MMS Per dr Leonce patient should have 13 hour prep   Other Itching    States reaction to contrast dye used for CTA of her brain - itching of tongue, tingling lower lip, coughing  [2]  Current Outpatient Medications on File Prior to Visit  Medication Sig Dispense Refill   acetaminophen  (TYLENOL ) 500 MG tablet Take 1,000 mg by mouth every 6 (six) hours as needed (headaches/migraines/pain).     clonazePAM  (KLONOPIN ) 0.5 MG tablet Take 1 tablet (0.5 mg total) by mouth daily as needed for anxiety. For severe panic attacks, 15 tablets must last 30 days. 15 tablet 0   escitalopram  (LEXAPRO ) 20 MG tablet Take 1 tablet (20 mg total) by mouth daily. 90 tablet 1   Ivermectin 1 % CREA Apply topically.     ketoconazole  (NIZORAL ) 2 % cream Apply 1 Application to affected areas topically up to 2 (two) times daily as needed. 60 g 3   meloxicam (MOBIC) 15 MG tablet Take 15 mg by mouth daily. (Patient taking differently: Take 15 mg by mouth daily. As needed)     methocarbamol (ROBAXIN)  500 MG tablet Take 500 mg by mouth 4 (four) times daily.     valACYclovir  (VALTREX ) 1000 MG tablet Take 2 tablets (2,000 mg total) by mouth 2 (two) times daily for one day as needed for outbreaks. (Patient taking differently: Take 2,000 mg by mouth 3 (three)  times daily. As needed) 12 tablet 0   Cholecalciferol (VITAMIN D3) 50 MCG (2000 UT) TABS Take by mouth. (Patient not taking: Reported on 05/26/2024)     No current facility-administered medications on file prior to visit.

## 2024-05-26 NOTE — Assessment & Plan Note (Signed)
 She is certainly a candidate for GLP-1 agonist treatment.  We discussed options for treatment, administration instructions, and potential side effects.  She would like to proceed.  Start semaglutide  (Wegovy ) for weight loss. Start by injecting 0.25 mg into the skin once weekly for 4 weeks, then increase to 0.5 mg once weekly thereafter.    Follow-up in 3 months if medication is approved by insurance

## 2024-05-27 ENCOUNTER — Other Ambulatory Visit: Payer: Self-pay

## 2024-05-28 ENCOUNTER — Other Ambulatory Visit: Payer: Self-pay | Admitting: Primary Care

## 2024-05-28 ENCOUNTER — Other Ambulatory Visit: Payer: Self-pay

## 2024-05-28 DIAGNOSIS — R7303 Prediabetes: Secondary | ICD-10-CM

## 2024-05-28 DIAGNOSIS — E6609 Other obesity due to excess calories: Secondary | ICD-10-CM

## 2024-05-28 MED ORDER — SEMAGLUTIDE-WEIGHT MANAGEMENT 0.25 MG/0.5ML ~~LOC~~ SOAJ
0.2500 mg | SUBCUTANEOUS | 0 refills | Status: DC
  Filled 2024-05-28 – 2024-05-29 (×2): qty 2, 28d supply, fill #0

## 2024-05-29 ENCOUNTER — Other Ambulatory Visit (HOSPITAL_COMMUNITY): Payer: Self-pay

## 2024-05-29 ENCOUNTER — Telehealth: Payer: Self-pay

## 2024-05-29 ENCOUNTER — Other Ambulatory Visit: Payer: Self-pay

## 2024-05-29 NOTE — Telephone Encounter (Signed)
 Spoke with pt relaying message from PA team. Pt verbalizes understanding and expresses her thanks for trying.

## 2024-05-29 NOTE — Telephone Encounter (Signed)
 Pharmacy Patient Advocate Encounter   Received notification from Onbase that prior authorization for Wegovy  0.25 is required/requested.   Insurance verification completed.   The patient is insured through [facility].   Per test claim: Per test claim, medication is not covered due to plan/benefit exclusion, PA not submitted at this time  Patient is a [employer] employee, no GLP1s are covered except for T2 diabetes.

## 2024-05-29 NOTE — Telephone Encounter (Signed)
 Please call and notify patient regarding the below message.

## 2024-06-03 DIAGNOSIS — M5459 Other low back pain: Secondary | ICD-10-CM | POA: Diagnosis not present

## 2024-06-08 ENCOUNTER — Other Ambulatory Visit: Payer: Self-pay

## 2024-06-08 ENCOUNTER — Encounter: Payer: Self-pay | Admitting: Psychiatry

## 2024-06-08 ENCOUNTER — Telehealth: Admitting: Psychiatry

## 2024-06-08 DIAGNOSIS — F3342 Major depressive disorder, recurrent, in full remission: Secondary | ICD-10-CM

## 2024-06-08 DIAGNOSIS — F5081 Binge eating disorder, mild: Secondary | ICD-10-CM | POA: Diagnosis not present

## 2024-06-08 DIAGNOSIS — F439 Reaction to severe stress, unspecified: Secondary | ICD-10-CM | POA: Diagnosis not present

## 2024-06-08 DIAGNOSIS — F418 Other specified anxiety disorders: Secondary | ICD-10-CM

## 2024-06-08 NOTE — Progress Notes (Unsigned)
 Virtual Visit via Video Note  I connected with Autumn [name] on 06/08/2024 at  3:40 PM EST by a video enabled telemedicine application and verified that I am speaking with the correct person using two identifiers.  Location Provider Location : [facility] Patient Location : Work  Participants: Patient , Provider    I discussed the limitations of evaluation and management by telemedicine and the availability of in person appointments. The patient expressed understanding and agreed to proceed   I discussed the assessment and treatment plan with the patient. The patient was provided an opportunity to ask questions and all were answered. The patient agreed with the plan and demonstrated an understanding of the instructions.   The patient was advised to call back or seek an in-person evaluation if the symptoms worsen or if the condition fails to improve as anticipated.  BH MD OP Progress Note  06/08/2024 3:53 PM Autumn [name]  MRN:  [id number]  Chief Complaint:  Chief Complaint  Patient presents with   Medication Refill   Follow-up   Anxiety   Depression   Discussed the use of AI scribe software for clinical note transcription with the patient, who gave verbal consent to proceed.  History of Present Illness Autumn [name] is a 38 year old Caucasian female, employed, separated, lives in [place], has a history of MDD, other specified anxiety disorder, prediabetes, history of tachycardia, history of bilateral pulmonary embolism on anticoagulation was evaluated by telemedicine today.  Overall, she describes feeling good and stable, noting her mood remains at a good level. She states that everything is pretty stable so far, and while she occasionally experiences a bad dream, it is not constant. Focusing on her son, who is experiencing issues with school and depression, she identifies staying attentive to his needs as a current stressor. She states she is coping well with her  ongoing separation and describes interactions as calmer and manageable.  She continues to use Lexapro  and notes recent changes in insurance now require mail order delivery. She confirms discontinuing Topamax  since the last visit and does not report concerns about binge eating or trauma-related nightmares. She continues therapy with [facility]. She describes sleeping through the night, with only occasional vivid dreams.  She denies any suicidality, homicidality or perceptual disturbances.  She denies any side effects to medications.    Visit Diagnosis:    ICD-10-CM   1. Recurrent major depressive disorder, in full remission  F33.42     2. Other specified anxiety disorders  F41.8    With limited symptom attacks    3. Mild binge-eating disorder  F50.810     4. Trauma and stressor-related disorder  F43.9    Unspecified, rule out PTSD      Past Psychiatric History: I have reviewed past psychiatric history from progress note on 02/10/2020.  Past trials of Lexapro , Wellbutrin -noncompliant.  Past Medical History:  Past Medical History:  Diagnosis Date   Allergy    Anxiety    Arthritis    Back pain    chronic   Bilateral pulmonary embolism (HCC)    a. 06/2020 following COVID infection.   COVID-19 virus infection 06/2020   Cystitis 10/28/2023   Depression    Disc disease, degenerative, lumbar or lumbosacral    GERD (gastroesophageal reflux disease)    Herpes zoster without complication 06/20/2018   History of echocardiogram    a. 06/2020 Echo: EF 55-60%, no rwma, nl RV size/fxn.   History of ovarian cyst    Imbalance  10/25/2020   Lacunar stroke (HCC) 12/08/2020   Left upper quadrant abdominal pain 10/23/2019   Lightheadedness 05/25/2021   Lung nodule 06/22/2020   Migraine    Nonallopathic lesion of cervical region 04/16/2018   Nonallopathic lesion of rib cage 04/16/2018   Nonallopathic lesion of thoracic region 04/16/2018   OME (otitis media with effusion), right  12/06/2020   Palpitations    a. 2/022 Zio: RSR, 85 (47-112). Rare PACs/PVCs. No significant arrhythmias/prolonged pauses.   Paresthesia 06/30/2019   Prediabetes    Scoliosis    Shingles 06/20/2018   Stroke [employer])    Remote lacunar strokes noted in the cerebellum by MRI    Past Surgical History:  Procedure Laterality Date   APPENDECTOMY     BREAST SURGERY Right 2008   lump/ benign   TEE WITHOUT CARDIOVERSION N/A 12/13/2020   Procedure: TRANSESOPHAGEAL ECHOCARDIOGRAM (TEE);  Surgeon: Mady Bruckner, MD;  Location: [facility] ORS;  Service: Cardiovascular;  Laterality: N/A;    Family Psychiatric History: I have reviewed family psychiatric history from progress note on 02/10/2020.  Family History:  Family History  Problem Relation Age of Onset   Breast cancer Mother 3       and 56   Atrial fibrillation Mother    Cancer Mother    Alcohol abuse Father    Vision loss Maternal Grandmother    Heart failure Maternal Grandmother    Heart disease Maternal Grandmother    Cancer Maternal Grandfather    Ankylosing spondylitis Maternal Grandfather    Alcohol abuse Paternal Grandfather    Arthritis/Rheumatoid Maternal Aunt    Anxiety disorder Maternal Aunt    Depression Maternal Aunt    Ankylosing spondylitis Maternal Aunt    Atrial fibrillation Maternal Aunt    Arthritis Maternal Aunt     Social History: I have reviewed social history from progress note on 02/10/2020. Social History   Socioeconomic History   Marital status: Divorced    Spouse name: Not on file   Number of children: 1   Years of education: Not on file   Highest education level: Associate degree: academic program  Occupational History   Occupation: futures trader  Tobacco Use   Smoking status: Former    Current packs/day: 0.00    Types: Cigarettes    Start date: 08/19/2006    Quit date: 08/18/2016    Years since quitting: 7.8   Smokeless tobacco: Never   Tobacco comments:    3 cigarettes a day  Vaping Use   Vaping  status: Never Used  Substance and Sexual Activity   Alcohol use: Yes    Alcohol/week: 0.0 standard drinks of alcohol    Comment: occasional   Drug use: No   Sexual activity: Yes    Birth control/protection: Pill  Other Topics Concern   Not on file  Social History Narrative   Right handed   Social Drivers of Health   Tobacco Use: Medium Risk (06/08/2024)   Patient History    Smoking Tobacco Use: Former    Smokeless Tobacco Use: Never    Passive Exposure: Not on Actuary Strain: Not on file  Food Insecurity: Not on file  Transportation Needs: Not on file  Physical Activity: Not on file  Stress: Not on file  Social Connections: Not on file  Depression (PHQ2-9): Medium Risk (05/26/2024)   Depression (PHQ2-9)    PHQ-2 Score: 7  Alcohol Screen: Not on file  Housing: Not on file  Utilities: Not on file  Health  Literacy: Not on file    Allergies: Allergies[1]  Metabolic Disorder Labs: Lab Results  Component Value Date   HGBA1C 6.0 05/26/2024   No results found for: PROLACTIN Lab Results  Component Value Date   CHOL 134 05/26/2024   TRIG 74.0 05/26/2024   HDL 42.90 05/26/2024   CHOLHDL 3 05/26/2024   VLDL 14.8 05/26/2024   LDLCALC 76 05/26/2024   LDLCALC 87 02/14/2023   Lab Results  Component Value Date   TSH 2.03 12/30/2020   TSH 1.029 07/08/2020    Therapeutic Level Labs: No results found for: LITHIUM No results found for: VALPROATE No results found for: CBMZ  Current Medications: Current Outpatient Medications  Medication Sig Dispense Refill   acetaminophen  (TYLENOL ) 500 MG tablet Take 1,000 mg by mouth every 6 (six) hours as needed (headaches/migraines/pain).     Cholecalciferol (VITAMIN D3) 50 MCG (2000 UT) TABS Take by mouth. (Patient not taking: Reported on 05/26/2024)     clonazePAM  (KLONOPIN ) 0.5 MG tablet Take 1 tablet (0.5 mg total) by mouth daily as needed for anxiety. For severe panic attacks, 15 tablets must last 30  days. 15 tablet 0   escitalopram  (LEXAPRO ) 20 MG tablet Take 1 tablet (20 mg total) by mouth daily. 90 tablet 1   Ivermectin 1 % CREA Apply topically.     ketoconazole  (NIZORAL ) 2 % cream Apply 1 Application to affected areas topically up to 2 (two) times daily as needed. 60 g 3   meloxicam (MOBIC) 15 MG tablet Take 15 mg by mouth daily. (Patient taking differently: Take 15 mg by mouth daily. As needed)     methocarbamol (ROBAXIN) 500 MG tablet Take 500 mg by mouth 4 (four) times daily.     semaglutide -weight management (WEGOVY ) 0.25 MG/0.5ML SOAJ SQ injection Inject 0.25 mg into the skin once a week. 2 mL 0   valACYclovir  (VALTREX ) 1000 MG tablet Take 2 tablets (2,000 mg total) by mouth 2 (two) times daily for one day as needed for outbreaks. (Patient taking differently: Take 2,000 mg by mouth 3 (three) times daily. As needed) 12 tablet 0   No current facility-administered medications for this visit.     Musculoskeletal: Strength & Muscle Tone: UTA Gait & Station: Normal Patient leans: N/A  Psychiatric Specialty Exam: Review of Systems  Psychiatric/Behavioral: Negative.      Last menstrual period 05/02/2024.There is no height or weight on file to calculate BMI.  General Appearance: Casual  Eye Contact:  Fair  Speech:  Normal Rate  Volume:  Normal  Mood:  Euthymic  Affect:  Congruent  Thought Process:  Goal Directed and Descriptions of Associations: Intact  Orientation:  Full (Time, Place, and Person)  Thought Content: Logical   Suicidal Thoughts:  No  Homicidal Thoughts:  No  Memory:  Immediate;   Fair Recent;   Fair Remote;   Fair  Judgement:  Fair  Insight:  Fair  Psychomotor Activity:  Normal  Concentration:  Concentration: Fair and Attention Span: Fair  Recall:  [employer] of Knowledge: Fair  Language: Fair  Akathisia:  No  Handed:  Right  AIMS (if indicated): not done  Assets:  Communication Skills Desire for Improvement Housing Social Support Transportation   ADL's:  Intact  Cognition: WNL  Sleep:  Fair   Screenings: AIMS    Flowsheet Row Video Visit from 11/20/2021 in [facility]  AIMS Total Score 0   GAD-7    Flowsheet Row Office Visit from 05/26/2024  in [facility] Visit from 10/21/2023 in [facility] Office Visit from 02/14/2023 in [facility] Office Visit from 07/20/2022 in [facility] Video Visit from 11/20/2021 in [facility]  Total GAD-7 Score 1 2 2 3 2    PHQ2-9    Flowsheet Row Office Visit from 05/26/2024 in [facility] Office Visit from 10/21/2023 in [facility] Video Visit from 02/25/2023 in [facility] Office Visit from 02[facility]14/2023 in [facility] Office Visit from 07/20/2022 in [facility]  PHQ-2 Total Score 2 4 0 2 2  PHQ-9 Total Score 7 13 -- 5 5   Flowsheet Row Video Visit from 06/08/2024 in [facility] Video Visit from 03/05/2024 in [facility] Video Visit from 01/02/2024 in [facility]  C-SSRS RISK CATEGORY No Risk No Risk No Risk     Assessment and Plan: Autumn [name] is a 38 year old Caucasian female, employed, lives in [place], has a history of depression, anxiety was evaluated by telemedicine today.  Discussed assessment and plan as noted below.  1. Recurrent major depressive disorder, in full remission Currently denies any significant depression symptoms Continue Lexapro  20 mg daily Continue psychotherapy sessions with [facility].  2. Other specified anxiety  disorders-stable Currently denies any concerns Continue Lexapro  as prescribed Continue Clonazepam  0.5 mg daily as needed Continue psychotherapy sessions. Reviewed [place] PMP AWARxE   3. Mild binge-eating disorder-stable Denies any concerns. Continue CBT  4. Trauma and stressor-related disorder-improved Denies any intrusive memories flashbacks or nightmares Continue CBT, will reevaluate in future sessions  Follow-up Follow-up in clinic in 3 months or sooner in person.    Collaboration of Care: Collaboration of Care: Referral or follow-up with counselor/therapist AEB encouraged to continue psychotherapy sessions.  Patient/Guardian was advised Release of Information must be obtained prior to any record release in order to collaborate their care with an outside provider. Patient/Guardian was advised if they have not already done so to contact the registration department to sign all necessary forms in order for us  to release information regarding their care.   Consent: Patient/Guardian gives verbal consent for treatment and assignment of benefits for services provided during this visit. Patient/Guardian expressed understanding and agreed to proceed.  This note was generated in part or whole with voice recognition software. Voice recognition is usually quite accurate but there are transcription errors that can and very often do occur. I apologize for any typographical errors that were not detected and corrected.     Netha Dafoe, MD 06/09/2024, 8:52 AM     [1]  Allergies Allergen Reactions   Imitrex [Sumatriptan] Other (See Comments)    Vomiting, Slurred Speech and Facial Numbness   Buspirone  Other (See Comments)   Gadolinium Derivatives Other (See Comments)    Lip tingling, scratchy throat   Iodinated Contrast Media Itching and Cough    Patient experienced sneezing, throat itching, and feeling like needing to cough Given 25 mg benadryl and observed x 20 mins/MMS Per dr Leonce  patient should have 13 hour prep   Other Itching    States reaction to contrast dye used for CTA of her brain - itching of tongue, tingling lower lip, coughing

## 2024-06-15 ENCOUNTER — Other Ambulatory Visit (HOSPITAL_COMMUNITY): Payer: Self-pay

## 2024-06-15 ENCOUNTER — Other Ambulatory Visit: Payer: Self-pay

## 2024-06-15 DIAGNOSIS — F331 Major depressive disorder, recurrent, moderate: Secondary | ICD-10-CM

## 2024-06-16 ENCOUNTER — Encounter (HOSPITAL_COMMUNITY): Payer: Self-pay

## 2024-06-16 ENCOUNTER — Other Ambulatory Visit (HOSPITAL_COMMUNITY): Payer: Self-pay

## 2024-06-16 MED ORDER — ESCITALOPRAM OXALATE 20 MG PO TABS
20.0000 mg | ORAL_TABLET | Freq: Every day | ORAL | 1 refills | Status: AC
  Filled 2024-06-16: qty 90, 90d supply, fill #0

## 2024-06-17 ENCOUNTER — Other Ambulatory Visit (HOSPITAL_COMMUNITY): Payer: Self-pay

## 2024-06-18 ENCOUNTER — Other Ambulatory Visit: Payer: Self-pay

## 2024-06-19 ENCOUNTER — Other Ambulatory Visit (HOSPITAL_COMMUNITY): Payer: Self-pay

## 2024-06-22 ENCOUNTER — Other Ambulatory Visit: Payer: Self-pay

## 2024-06-22 MED ORDER — SODIUM FLUORIDE 1.1 % DT PSTE
PASTE | DENTAL | 0 refills | Status: AC
  Filled 2024-06-22: qty 100, 30d supply, fill #0

## 2024-06-29 DIAGNOSIS — R7303 Prediabetes: Secondary | ICD-10-CM

## 2024-06-29 DIAGNOSIS — E6609 Other obesity due to excess calories: Secondary | ICD-10-CM

## 2024-06-30 MED ORDER — WEGOVY 0.5 MG/0.5ML ~~LOC~~ SOAJ
0.5000 mg | SUBCUTANEOUS | 1 refills | Status: AC
  Filled 2024-06-30: qty 2, 28d supply, fill #0
  Filled 2024-07-08: qty 2, 28d supply, fill #1

## 2024-07-01 ENCOUNTER — Other Ambulatory Visit: Payer: Self-pay

## 2024-07-08 ENCOUNTER — Other Ambulatory Visit: Payer: Self-pay

## 2024-09-03 ENCOUNTER — Ambulatory Visit: Admitting: Psychiatry

## 2024-11-13 ENCOUNTER — Encounter
